# Patient Record
Sex: Female | Born: 1935
Health system: Southern US, Community
[De-identification: ages and names within clinical notes are randomized; demographics above are authoritative.]

## PROBLEM LIST (undated history)

## (undated) DIAGNOSIS — D649 Anemia, unspecified: Secondary | ICD-10-CM

## (undated) DIAGNOSIS — K649 Unspecified hemorrhoids: Secondary | ICD-10-CM

## (undated) DIAGNOSIS — K5790 Diverticulosis of intestine, part unspecified, without perforation or abscess without bleeding: Secondary | ICD-10-CM

## (undated) DIAGNOSIS — C169 Malignant neoplasm of stomach, unspecified: Secondary | ICD-10-CM

## (undated) DIAGNOSIS — K589 Irritable bowel syndrome without diarrhea: Secondary | ICD-10-CM

## (undated) DIAGNOSIS — K4090 Unilateral inguinal hernia, without obstruction or gangrene, not specified as recurrent: Secondary | ICD-10-CM

## (undated) DIAGNOSIS — K259 Gastric ulcer, unspecified as acute or chronic, without hemorrhage or perforation: Secondary | ICD-10-CM

## (undated) DIAGNOSIS — N390 Urinary tract infection, site not specified: Secondary | ICD-10-CM

## (undated) DIAGNOSIS — E559 Vitamin D deficiency, unspecified: Secondary | ICD-10-CM

## (undated) DIAGNOSIS — N362 Urethral caruncle: Secondary | ICD-10-CM

## (undated) DIAGNOSIS — I10 Essential (primary) hypertension: Secondary | ICD-10-CM

## (undated) DIAGNOSIS — M1990 Unspecified osteoarthritis, unspecified site: Secondary | ICD-10-CM

## (undated) DIAGNOSIS — K5900 Constipation, unspecified: Secondary | ICD-10-CM

## (undated) HISTORY — DX: Diverticulosis of intestine, part unspecified, without perforation or abscess without bleeding: K57.90

## (undated) HISTORY — PX: APPENDECTOMY: SHX54

## (undated) HISTORY — PX: OTHER SURGICAL HISTORY: SHX169

## (undated) HISTORY — PX: LUMBAR FUSION: SHX111

## (undated) HISTORY — DX: Irritable bowel syndrome, unspecified: K58.9

## (undated) HISTORY — PX: TOTAL HIP ARTHROPLASTY: SHX124

## (undated) HISTORY — PX: SHOULDER SURGERY: SHX246

## (undated) HISTORY — PX: FINGER SURGERY: SHX640

## (undated) HISTORY — DX: Unspecified osteoarthritis, unspecified site: M19.90

## (undated) HISTORY — DX: Unspecified hemorrhoids: K64.9

## (undated) HISTORY — DX: Malignant neoplasm of stomach, unspecified: C16.9

## (undated) HISTORY — DX: Vitamin D deficiency, unspecified: E55.9

## (undated) HISTORY — PX: LUMBAR LAMINECTOMY: SHX95

## (undated) HISTORY — PX: HEMORRHOID SURGERY: SHX153

## (undated) HISTORY — DX: Urinary tract infection, site not specified: N39.0

## (undated) HISTORY — DX: Gastric ulcer, unspecified as acute or chronic, without hemorrhage or perforation: K25.9

## (undated) HISTORY — PX: BREAST SURGERY: SHX581

## (undated) HISTORY — DX: Anemia, unspecified: D64.9

## (undated) HISTORY — DX: Urethral caruncle: N36.2

## (undated) HISTORY — PX: HIP SURGERY: SHX245

## (undated) HISTORY — DX: Essential (primary) hypertension: I10

## (undated) HISTORY — PX: CERVICAL FUSION: SHX112

---

## 1998-09-09 ENCOUNTER — Other Ambulatory Visit: Admission: RE | Admit: 1998-09-09 | Discharge: 1998-09-09 | Payer: Self-pay | Admitting: Gastroenterology

## 1998-10-28 ENCOUNTER — Other Ambulatory Visit: Admission: RE | Admit: 1998-10-28 | Discharge: 1998-10-28 | Payer: Self-pay | Admitting: Obstetrics and Gynecology

## 1999-11-03 ENCOUNTER — Other Ambulatory Visit: Admission: RE | Admit: 1999-11-03 | Discharge: 1999-11-03 | Payer: Self-pay | Admitting: Obstetrics and Gynecology

## 1999-11-15 ENCOUNTER — Ambulatory Visit (HOSPITAL_COMMUNITY): Admission: RE | Admit: 1999-11-15 | Discharge: 1999-11-15 | Payer: Self-pay | Admitting: Neurosurgery

## 2001-01-19 ENCOUNTER — Other Ambulatory Visit: Admission: RE | Admit: 2001-01-19 | Discharge: 2001-01-19 | Payer: Self-pay | Admitting: Internal Medicine

## 2001-04-04 ENCOUNTER — Encounter (INDEPENDENT_AMBULATORY_CARE_PROVIDER_SITE_OTHER): Payer: Self-pay | Admitting: Specialist

## 2001-04-04 ENCOUNTER — Ambulatory Visit (HOSPITAL_BASED_OUTPATIENT_CLINIC_OR_DEPARTMENT_OTHER): Admission: RE | Admit: 2001-04-04 | Discharge: 2001-04-04 | Payer: Self-pay | Admitting: General Surgery

## 2002-05-17 ENCOUNTER — Other Ambulatory Visit: Admission: RE | Admit: 2002-05-17 | Discharge: 2002-05-17 | Payer: Self-pay | Admitting: Gynecology

## 2003-06-04 ENCOUNTER — Other Ambulatory Visit: Admission: RE | Admit: 2003-06-04 | Discharge: 2003-06-04 | Payer: Self-pay | Admitting: Gynecology

## 2004-09-09 ENCOUNTER — Ambulatory Visit: Payer: Self-pay | Admitting: Internal Medicine

## 2005-01-19 ENCOUNTER — Ambulatory Visit: Payer: Self-pay | Admitting: Internal Medicine

## 2005-04-21 ENCOUNTER — Ambulatory Visit: Payer: Self-pay | Admitting: Internal Medicine

## 2005-07-23 ENCOUNTER — Ambulatory Visit: Payer: Self-pay | Admitting: Internal Medicine

## 2005-08-30 ENCOUNTER — Ambulatory Visit: Payer: Self-pay | Admitting: Internal Medicine

## 2005-09-08 ENCOUNTER — Ambulatory Visit: Payer: Self-pay | Admitting: Gastroenterology

## 2005-09-14 ENCOUNTER — Other Ambulatory Visit: Admission: RE | Admit: 2005-09-14 | Discharge: 2005-09-14 | Payer: Self-pay | Admitting: Gynecology

## 2005-09-17 ENCOUNTER — Ambulatory Visit: Payer: Self-pay | Admitting: Internal Medicine

## 2005-09-19 ENCOUNTER — Encounter (INDEPENDENT_AMBULATORY_CARE_PROVIDER_SITE_OTHER): Payer: Self-pay | Admitting: *Deleted

## 2005-09-21 ENCOUNTER — Encounter (INDEPENDENT_AMBULATORY_CARE_PROVIDER_SITE_OTHER): Payer: Self-pay | Admitting: *Deleted

## 2005-09-21 ENCOUNTER — Ambulatory Visit: Payer: Self-pay | Admitting: Gastroenterology

## 2005-10-08 ENCOUNTER — Ambulatory Visit: Payer: Self-pay | Admitting: Internal Medicine

## 2005-10-11 ENCOUNTER — Ambulatory Visit: Payer: Self-pay | Admitting: Internal Medicine

## 2005-12-10 ENCOUNTER — Ambulatory Visit: Payer: Self-pay | Admitting: Internal Medicine

## 2006-06-09 ENCOUNTER — Ambulatory Visit: Payer: Self-pay | Admitting: Internal Medicine

## 2006-09-01 ENCOUNTER — Ambulatory Visit: Payer: Self-pay | Admitting: Internal Medicine

## 2006-09-27 LAB — CONVERTED CEMR LAB: Pap Smear: NORMAL

## 2006-11-11 ENCOUNTER — Ambulatory Visit: Payer: Self-pay | Admitting: Gastroenterology

## 2006-12-09 ENCOUNTER — Ambulatory Visit: Payer: Self-pay | Admitting: Internal Medicine

## 2006-12-09 LAB — CONVERTED CEMR LAB: TSH: 2.11 microintl units/mL (ref 0.35–5.50)

## 2007-03-09 ENCOUNTER — Ambulatory Visit: Payer: Self-pay | Admitting: Internal Medicine

## 2007-03-09 LAB — CONVERTED CEMR LAB: Vit D, 1,25-Dihydroxy: 21 (ref 20–57)

## 2007-05-30 ENCOUNTER — Ambulatory Visit: Payer: Self-pay | Admitting: Internal Medicine

## 2007-05-30 DIAGNOSIS — I10 Essential (primary) hypertension: Secondary | ICD-10-CM | POA: Insufficient documentation

## 2007-05-30 DIAGNOSIS — K589 Irritable bowel syndrome without diarrhea: Secondary | ICD-10-CM | POA: Insufficient documentation

## 2007-05-30 DIAGNOSIS — K5730 Diverticulosis of large intestine without perforation or abscess without bleeding: Secondary | ICD-10-CM | POA: Insufficient documentation

## 2007-05-30 DIAGNOSIS — M1990 Unspecified osteoarthritis, unspecified site: Secondary | ICD-10-CM | POA: Insufficient documentation

## 2007-05-30 DIAGNOSIS — M810 Age-related osteoporosis without current pathological fracture: Secondary | ICD-10-CM | POA: Insufficient documentation

## 2007-05-30 LAB — CONVERTED CEMR LAB
BUN: 23 mg/dL (ref 6–23)
CO2: 29 meq/L (ref 19–32)
Calcium: 9.3 mg/dL (ref 8.4–10.5)
Chloride: 101 meq/L (ref 96–112)
Creatinine, Ser: 0.8 mg/dL (ref 0.4–1.2)
GFR calc Af Amer: 90 mL/min
GFR calc non Af Amer: 75 mL/min
Glucose, Bld: 93 mg/dL (ref 70–99)
Potassium: 4.2 meq/L (ref 3.5–5.1)
Sodium: 137 meq/L (ref 135–145)

## 2007-06-22 ENCOUNTER — Encounter: Payer: Self-pay | Admitting: Internal Medicine

## 2007-08-08 ENCOUNTER — Telehealth: Payer: Self-pay | Admitting: Internal Medicine

## 2007-08-28 ENCOUNTER — Ambulatory Visit: Payer: Self-pay | Admitting: Internal Medicine

## 2007-08-28 LAB — CONVERTED CEMR LAB: Vit D, 1,25-Dihydroxy: 49 (ref 30–89)

## 2007-09-04 ENCOUNTER — Ambulatory Visit: Payer: Self-pay | Admitting: Internal Medicine

## 2007-09-04 LAB — CONVERTED CEMR LAB
BUN: 18 mg/dL (ref 6–23)
CA 125: 16.9 units/mL (ref 0.0–30.2)
CO2: 29 meq/L (ref 19–32)
Calcium: 9.8 mg/dL (ref 8.4–10.5)
Chloride: 104 meq/L (ref 96–112)
Creatinine, Ser: 0.7 mg/dL (ref 0.4–1.2)
GFR calc Af Amer: 105 mL/min
GFR calc non Af Amer: 87 mL/min
Glucose, Bld: 93 mg/dL (ref 70–99)
Potassium: 3.8 meq/L (ref 3.5–5.1)
Sodium: 142 meq/L (ref 135–145)

## 2007-09-05 ENCOUNTER — Telehealth: Payer: Self-pay | Admitting: *Deleted

## 2007-09-29 ENCOUNTER — Encounter: Payer: Self-pay | Admitting: Internal Medicine

## 2007-09-29 ENCOUNTER — Other Ambulatory Visit: Admission: RE | Admit: 2007-09-29 | Discharge: 2007-09-29 | Payer: Self-pay | Admitting: Gynecology

## 2007-12-04 ENCOUNTER — Ambulatory Visit: Payer: Self-pay | Admitting: Internal Medicine

## 2007-12-04 DIAGNOSIS — R0789 Other chest pain: Secondary | ICD-10-CM | POA: Insufficient documentation

## 2007-12-07 ENCOUNTER — Ambulatory Visit: Payer: Self-pay | Admitting: Internal Medicine

## 2007-12-07 ENCOUNTER — Encounter: Payer: Self-pay | Admitting: Internal Medicine

## 2008-03-26 ENCOUNTER — Ambulatory Visit: Payer: Self-pay | Admitting: Internal Medicine

## 2008-03-26 LAB — CONVERTED CEMR LAB
ALT: 23 units/L (ref 0–35)
AST: 26 units/L (ref 0–37)
Albumin: 4 g/dL (ref 3.5–5.2)
Alkaline Phosphatase: 45 units/L (ref 39–117)
Bilirubin, Direct: 0.1 mg/dL (ref 0.0–0.3)
Cholesterol: 198 mg/dL (ref 0–200)
HDL: 47.9 mg/dL (ref >39.0)
LDL Cholesterol: 133 mg/dL — ABNORMAL HIGH (ref 0–99)
Total Bilirubin: 1.1 mg/dL (ref 0.3–1.2)
Total CHOL/HDL Ratio: 4.1
Total Protein: 6.8 g/dL (ref 6.0–8.3)
Triglycerides: 88 mg/dL (ref 0–149)
VLDL: 18 mg/dL (ref 0–40)

## 2008-04-02 ENCOUNTER — Ambulatory Visit: Payer: Self-pay | Admitting: Internal Medicine

## 2008-04-02 DIAGNOSIS — K648 Other hemorrhoids: Secondary | ICD-10-CM | POA: Insufficient documentation

## 2008-04-04 ENCOUNTER — Telehealth: Payer: Self-pay | Admitting: *Deleted

## 2008-05-15 ENCOUNTER — Telehealth: Payer: Self-pay | Admitting: Internal Medicine

## 2008-06-12 ENCOUNTER — Inpatient Hospital Stay (HOSPITAL_COMMUNITY): Admission: EM | Admit: 2008-06-12 | Discharge: 2008-06-16 | Payer: Self-pay | Admitting: Emergency Medicine

## 2008-07-30 ENCOUNTER — Ambulatory Visit: Payer: Self-pay | Admitting: Internal Medicine

## 2008-08-22 ENCOUNTER — Telehealth: Payer: Self-pay | Admitting: *Deleted

## 2008-10-02 ENCOUNTER — Ambulatory Visit: Payer: Self-pay | Admitting: Internal Medicine

## 2008-10-02 DIAGNOSIS — E785 Hyperlipidemia, unspecified: Secondary | ICD-10-CM | POA: Insufficient documentation

## 2008-10-02 DIAGNOSIS — L659 Nonscarring hair loss, unspecified: Secondary | ICD-10-CM | POA: Insufficient documentation

## 2008-10-02 LAB — CONVERTED CEMR LAB
Cholesterol: 209 mg/dL (ref 0–200)
Direct LDL: 131.6 mg/dL
HDL: 48.9 mg/dL (ref >39.0)
Prealbumin: 22.8 mg/dL (ref 18.0–45.0)
TSH: 1.4 microintl units/mL (ref 0.35–5.50)
Total CHOL/HDL Ratio: 4.3
Total Protein: 7.3 g/dL (ref 6.0–8.3)
Triglycerides: 94 mg/dL (ref 0–149)
VLDL: 19 mg/dL (ref 0–40)

## 2009-01-01 LAB — HM MAMMOGRAPHY

## 2009-01-15 ENCOUNTER — Telehealth: Payer: Self-pay | Admitting: Internal Medicine

## 2009-04-02 ENCOUNTER — Ambulatory Visit: Payer: Self-pay | Admitting: Internal Medicine

## 2009-10-01 ENCOUNTER — Ambulatory Visit: Payer: Self-pay | Admitting: Internal Medicine

## 2009-10-01 LAB — CONVERTED CEMR LAB
ALT: 15 units/L (ref 0–35)
AST: 25 units/L (ref 0–37)
Albumin: 4 g/dL (ref 3.5–5.2)
Alkaline Phosphatase: 44 units/L (ref 39–117)
BUN: 18 mg/dL (ref 6–23)
Bilirubin, Direct: 0 mg/dL (ref 0.0–0.3)
CO2: 30 meq/L (ref 19–32)
Calcium: 9.1 mg/dL (ref 8.4–10.5)
Chloride: 107 meq/L (ref 96–112)
Cholesterol: 187 mg/dL (ref 0–200)
Creatinine, Ser: 0.8 mg/dL (ref 0.4–1.2)
GFR calc non Af Amer: 74.2 mL/min (ref >60)
Glucose, Bld: 88 mg/dL (ref 70–99)
HDL: 53.4 mg/dL (ref >39.00)
LDL Cholesterol: 112 mg/dL — ABNORMAL HIGH (ref 0–99)
Potassium: 3.9 meq/L (ref 3.5–5.1)
Sodium: 143 meq/L (ref 135–145)
TSH: 1.06 microintl units/mL (ref 0.35–5.50)
Total Bilirubin: 0.9 mg/dL (ref 0.3–1.2)
Total CHOL/HDL Ratio: 4
Total Protein: 6.7 g/dL (ref 6.0–8.3)
Triglycerides: 109 mg/dL (ref 0.0–149.0)
VLDL: 21.8 mg/dL (ref 0.0–40.0)

## 2009-10-08 ENCOUNTER — Ambulatory Visit: Payer: Self-pay | Admitting: Internal Medicine

## 2009-12-02 ENCOUNTER — Ambulatory Visit: Payer: Self-pay | Admitting: Internal Medicine

## 2009-12-02 DIAGNOSIS — N362 Urethral caruncle: Secondary | ICD-10-CM | POA: Insufficient documentation

## 2009-12-02 LAB — CONVERTED CEMR LAB
Bilirubin Urine: NEGATIVE
Blood in Urine, dipstick: NEGATIVE
Glucose, Urine, Semiquant: NEGATIVE
Ketones, urine, test strip: NEGATIVE
Nitrite: NEGATIVE
Protein, U semiquant: NEGATIVE
Specific Gravity, Urine: 1.015
Urobilinogen, UA: 0.2
WBC Urine, dipstick: NEGATIVE
pH: 6.5

## 2009-12-05 ENCOUNTER — Telehealth: Payer: Self-pay | Admitting: Internal Medicine

## 2010-02-11 ENCOUNTER — Ambulatory Visit: Payer: Self-pay | Admitting: Internal Medicine

## 2010-02-11 ENCOUNTER — Encounter: Payer: Self-pay | Admitting: Internal Medicine

## 2010-05-07 ENCOUNTER — Encounter: Payer: Self-pay | Admitting: Internal Medicine

## 2010-05-11 ENCOUNTER — Encounter: Payer: Self-pay | Admitting: Gastroenterology

## 2010-06-12 ENCOUNTER — Ambulatory Visit: Payer: Self-pay | Admitting: Internal Medicine

## 2010-06-12 DIAGNOSIS — K621 Rectal polyp: Secondary | ICD-10-CM

## 2010-06-12 DIAGNOSIS — K620 Anal polyp: Secondary | ICD-10-CM | POA: Insufficient documentation

## 2010-06-12 DIAGNOSIS — D649 Anemia, unspecified: Secondary | ICD-10-CM | POA: Insufficient documentation

## 2010-06-12 LAB — CONVERTED CEMR LAB
Basophils Absolute: 0.1 10*3/uL (ref 0.0–0.1)
Basophils Relative: 0.7 % (ref 0.0–3.0)
CEA: 1.6 ng/mL (ref 0.0–5.0)
Eosinophils Absolute: 0.2 10*3/uL (ref 0.0–0.7)
Eosinophils Relative: 1.8 % (ref 0.0–5.0)
HCT: 33.8 % — ABNORMAL LOW (ref 36.0–46.0)
Hemoglobin: 11.5 g/dL — ABNORMAL LOW (ref 12.0–15.0)
Lymphocytes Relative: 33.8 % (ref 12.0–46.0)
Lymphs Abs: 2.9 10*3/uL (ref 0.7–4.0)
MCHC: 34.1 g/dL (ref 30.0–36.0)
MCV: 94.7 fL (ref 78.0–100.0)
Monocytes Absolute: 0.7 10*3/uL (ref 0.1–1.0)
Monocytes Relative: 7.9 % (ref 3.0–12.0)
Neutro Abs: 4.8 10*3/uL (ref 1.4–7.7)
Neutrophils Relative %: 55.8 % (ref 43.0–77.0)
Platelets: 180 10*3/uL (ref 150.0–400.0)
RBC: 3.57 M/uL — ABNORMAL LOW (ref 3.87–5.11)
RDW: 13 % (ref 11.5–14.6)
WBC: 8.6 10*3/uL (ref 4.5–10.5)

## 2010-06-16 ENCOUNTER — Ambulatory Visit: Payer: Self-pay | Admitting: Gastroenterology

## 2010-06-16 ENCOUNTER — Encounter: Payer: Self-pay | Admitting: Gastroenterology

## 2010-06-16 ENCOUNTER — Encounter (INDEPENDENT_AMBULATORY_CARE_PROVIDER_SITE_OTHER): Payer: Self-pay | Admitting: *Deleted

## 2010-06-16 DIAGNOSIS — K625 Hemorrhage of anus and rectum: Secondary | ICD-10-CM | POA: Insufficient documentation

## 2010-06-16 LAB — CONVERTED CEMR LAB
Ferritin: 70.6 ng/mL (ref 10.0–291.0)
Folate: >20 ng/mL
Iron: 103 ug/dL (ref 42–145)
Saturation Ratios: 32.7 % (ref 20.0–50.0)
Transferrin: 224.7 mg/dL (ref 212.0–360.0)
Vitamin B-12: 952 pg/mL — ABNORMAL HIGH (ref 211–911)

## 2010-06-17 ENCOUNTER — Telehealth: Payer: Self-pay | Admitting: Gastroenterology

## 2010-07-06 ENCOUNTER — Ambulatory Visit: Payer: Self-pay | Admitting: Gastroenterology

## 2010-07-06 ENCOUNTER — Ambulatory Visit (HOSPITAL_COMMUNITY): Admission: RE | Admit: 2010-07-06 | Discharge: 2010-07-06 | Payer: Self-pay | Admitting: Gastroenterology

## 2010-07-06 LAB — HM COLONOSCOPY

## 2010-07-08 ENCOUNTER — Telehealth: Payer: Self-pay | Admitting: Gastroenterology

## 2010-07-29 ENCOUNTER — Ambulatory Visit: Payer: Self-pay | Admitting: Gastroenterology

## 2010-09-11 ENCOUNTER — Ambulatory Visit: Payer: Self-pay | Admitting: Internal Medicine

## 2010-09-28 ENCOUNTER — Ambulatory Visit: Payer: Self-pay | Admitting: Internal Medicine

## 2010-09-28 DIAGNOSIS — J0110 Acute frontal sinusitis, unspecified: Secondary | ICD-10-CM | POA: Insufficient documentation

## 2010-10-06 ENCOUNTER — Telehealth: Payer: Self-pay | Admitting: Internal Medicine

## 2010-11-23 ENCOUNTER — Telehealth: Payer: Self-pay | Admitting: Internal Medicine

## 2010-11-24 NOTE — Progress Notes (Signed)
Summary: Medication  Phone Note Call from Patient Call back at Home Phone [phone]   Caller: Patient Call For: Dr. Jarold Motto Reason for Call: Talk to Nurse Summary of Call: Meds Dr.Patterson perscribed are too expensive...needs an alternative Initial call taken by: Karna Christmas,  June 17, 2010 8:05 AM  Follow-up for Phone Call        Pt states she cannot afford the canasa supp.  Samples given for pt to try.   Follow-up by: Ashok Cordia RN,  June 18, 2010 12:17 PM    New/Updated Medications: CANASA 1000 MG  SUPP (MESALAMINE) 1 per rectum q hs

## 2010-11-24 NOTE — Assessment & Plan Note (Signed)
Summary: flu shot/njr  Nurse Visit    Prior Medications: OSCAL 500/200 D-3 500-200 MG-UNIT  TABS (CALCIUM-VITAMIN D) once daily FISH OIL   OIL (FISH OIL) 4 po daily ZIAC 5-6.25 MG  TABS (BISOPROLOL-HYDROCHLOROTHIAZIDE) once daily MOBIC 7.5 MG  TABS (MELOXICAM) AS needed ANALPRAM-HC 1-2.5 %  CREA (HYDROCORTISONE ACE-PRAMOXINE) apply q HS for 7 days VITAMIN D 63875 UNIT  CAPS (ERGOCALCIFEROL) one by mouth weekly MEDROL 4 MG  TABS (METHYLPREDNISOLONE) as directed Current Allergies: No known allergies   Flu Vaccine Consent Questions     Do you have a history of severe allergic reactions to this vaccine? no    Any prior history of allergic reactions to egg and/or gelatin? no    Do you have a sensitivity to the preservative Thimersol? no    Do you have a past history of Guillan-Barre Syndrome? no    Do you currently have an acute febrile illness? no    Have you ever had a severe reaction to latex? no    Vaccine information given and explained to patient? yes    Are you currently pregnant? no    Lot Number:[id number]   Site Given  Left Deltoid IM   Orders Added: 1)  Flu Vaccine 91yrs + [64332] 2)  Administration Flu vaccine [G0008]    ]

## 2010-11-24 NOTE — Assessment & Plan Note (Signed)
Summary: Rectal Bleeding/dfs   History of Present Illness Visit Type: Initial Visit Primary GI MD: Sheryn Bison MD FACP FAGA Primary Roarke Marciano: Stacie Glaze MD Chief Complaint: Rectal bleeding recently 8/18; patient states that she had a tumor removed 40years ago located in large intestine;possible hemorrhoids? History of Present Illness:   77 year old Caucasian female patient of Dr. Darryll Capers. She's previously been a patient of Dr. Terrial Rhodes in gastroenterology. She has an extremely strong family history of colon cancer in her mother and brother, and she apparently had surgical removal of colon tumor of unknown etiology when she was age 18.  She's had several colonoscopies most recently performed in 2006. She has had continual rectal bleeding for several months, but this admitted a problem for several years, and she has not responded to standard hemorrhoidal creams. She continues with mild constipation, rectal discomfort, and rectal bleeding. She recent saw Dr. Lovell Sheehan and was placed on Analpram cream. She does take a daily fiber supplement.  Family history is remarkable for mother having colon cancer at age 46, sister colon cancer age 33, brother with colon cancer age 82, father with throat cancer age 61, another sister with breast cancer, another brother with lung cancer, and several siblings with severe coronary artery disease. She recently had blood work which showed a mild nonspecific anemia. She denies abdominal pain or upper gastrointestinal or hepatobiliary symptomatology. There's been no anorexia, weight loss, or systemic symptoms.   GI Review of Systems      Denies abdominal pain, acid reflux, belching, bloating, chest pain, dysphagia with liquids, dysphagia with solids, heartburn, loss of appetite, nausea, vomiting, vomiting blood, weight loss, and  weight gain.      Reports black tarry stools, diverticulosis, hemorrhoids, and  rectal bleeding.     Denies anal fissure,  change in bowel habit, constipation, diarrhea, fecal incontinence, heme positive stool, irritable bowel syndrome, jaundice, light color stool, liver problems, and  rectal pain. Preventive Screening-Counseling & Management      Drug Use:  no.      Current Medications (verified): 1)  Citracal Plus  Tabs (Multiple Minerals-Vitamins) .... Once Daily 2)  Fish Oil Concentrate 1000 Mg Caps (Omega-3 Fatty Acids) .... Two By Mouth Bid 3)  Bisoprolol-Hydrochlorothiazide 5-6.25 Mg Tabs (Bisoprolol-Hydrochlorothiazide) .... One By Mouth Daily 4)  Mobic 15 Mg Tabs (Meloxicam) .Marland Kitchen.. 1 Once Daily As Needed 5)  Analpram-Hc 1-2.5 %  Crea (Hydrocortisone Ace-Pramoxine) .... Apply Q Hs For 7 Days 6)  Vitamin D 04540 Unit  Caps (Ergocalciferol) .... One By Mouth Weekly 7)  B Complex  Tabs (B Complex Vitamins) .... One By Mouth Daily 8)  Osteo Bi-Flex Regular Strength 250-200 Mg Tabs (Glucosamine-Chondroitin) .Marland Kitchen.. 1 Once Daily 9)  Multivitamins  Tabs (Multiple Vitamin) .... Once Daily  Allergies (verified): 1)  ! Codeine  Past History:  Past medical, surgical, family and social histories (including risk factors) reviewed for relevance to current acute and chronic problems.  Past Medical History: Diverticulosis, colon Osteoporosis Hypertension IBS 564.1 Urinary Tract Infection  Past Surgical History: Lumbar laminectomy Lumbar fusion partial resection of colon for "tumor" colon polyp colon 2006 Total hip replacement right Appendectomy  Family History: Reviewed history from 09/04/2007 and no changes required. father Family History Lung cancer mother Family History of Colon CA 1st degree relative <60 Family History Ovarian cancer sister   Family History of Liver Cancer:Brother Family History of Breast Cancer:Sister  Social History: Reviewed history from 05/30/2007 and no changes required. Retired Single Never Smoked Alcohol  Use - no Daily Caffeine Use 3 Illicit Drug Use - no  Review  of Systems       The patient complains of arthritis/joint pain and muscle pains/cramps.  The patient denies allergy/sinus, anemia, anxiety-new, back pain, blood in urine, breast changes/lumps, change in vision, confusion, cough, coughing up blood, depression-new, fainting, fatigue, fever, headaches-new, hearing problems, heart murmur, heart rhythm changes, itching, menstrual pain, night sweats, nosebleeds, pregnancy symptoms, shortness of breath, skin rash, sleeping problems, sore throat, swelling of feet/legs, swollen lymph glands, thirst - excessive , urination - excessive , urination changes/pain, urine leakage, vision changes, and voice change.    Vital Signs:  Patient profile:   77 year old female Height:      64 inches Weight:      117.13 pounds BMI:     20.18 Pulse rate:   80 / minute Pulse rhythm:   regular BP sitting:   120 / 68  (left arm) Cuff size:   regular  Vitals Entered By: June McMurray CMA Duncan Dull) (June 16, 2010 10:44 AM)  Physical Exam  General:  Well developed, well nourished, no acute distress.healthy appearing.   Head:  Normocephalic and atraumatic. Eyes:  PERRLA, no icterus.exam deferred to patient's ophthalmologist.   Neck:  Supple; no masses or thyromegaly. Lungs:  Clear throughout to auscultation. Heart:  Regular rate and rhythm; no murmurs, rubs,  or bruits. Abdomen:  There is no abdominal distention, organomegaly, masses or tenderness. Bowel sounds are normal. Rectal:  there are mixed hemorrhoids visualized with a small anterior skin tag. I cannot appreciate a definite fissure or fistula. Rectal exam shows no masses or tenderness or impaction. Stool is guaiac-negative. Extremities:  No clubbing, cyanosis, edema or deformities noted. Neurologic:  Alert and  oriented x4;  grossly normal neurologically. Psych:  Alert and cooperative. Normal mood and affect.anxious.     Impression & Recommendations:  Problem # 1:  RECTAL BLEEDING (ICD-569.3) Assessment  Deteriorated Her bleeding is probably hemorrhoidal, and she needs more definitive treatment of her hemorrhoids. I have scheduled her for followup colonoscopy with probable endoscopic BANDING per Dr. Arlyce Dice at [facility]. Of course with her family history of colon cancer we need to exclude colonic polyposis. Dr. Corinda Gubler apparently had to use a pediatric colonoscope previously because of colonic adhesions. Other considerations would be surgical stapling of her hemorrhoids. I have continued her Analpram cream and will also treat with Canasa 1 g suppositories at bedtime and continue fiber supplements for now. Iron levels have been requested.  Problem # 2:  COLONIC POLYPS, ADENOMATOUS, BENIGN (ICD-569.0) Assessment: Comment Only Consider referral for genetic evaluation for possible Lynch II syndrome per her family history of colon, lung, and gynecologic carcinoma. This hopefully can be accomplished after her colonoscopy has been completed by Dr. Arlyce Dice.  Other Orders: TLB-B12, Serum-Total ONLY ([address]-U04) TLB-Ferritin (82728-FER) TLB-Folic Acid (Folate) (82746-FOL) TLB-IBC Pnl (Iron/FE;Transferrin) (83550-IBC)  Patient Instructions: 1)  Please go to the basement for lab work. 2)  You are scheduled for a colonoscopy at [facility]. 3)  Begin Canasa supppository at bedtime 4)  The medication list was reviewed and reconciled.  All changed / newly prescribed medications were explained.  A complete medication list was provided to the patient / caregiver. 5)  Copy sent to : Dr. Darryll Capers. 6)  Please continue current medications.  7)  Constipation and Hemorrhoids brochure given.   Appended Document: Rectal Bleeding/dfs    Clinical Lists Changes  Medications: Added new medication of MOVIPREP 100 GM  SOLR (PEG-KCL-NACL-NASULF-NA ASC-C) As per prep instructions. - Signed Added new medication of CANASA 1000 MG  SUPP (MESALAMINE) 1 per rectum q hs - Signed Rx of MOVIPREP 100 GM  SOLR  (PEG-KCL-NACL-NASULF-NA ASC-C) As per prep instructions.;  #1 x 0;  Signed;  Entered by: Ashok Cordia RN;  Authorized by: Mardella Layman MD [facility];  Method used: Electronically to [facility] [address], [place], [place], [place]  [address], Ph: [phone], Fax: [phone] Rx of CANASA 1000 MG  SUPP (MESALAMINE) 1 per rectum q hs;  #30 x 2;  Signed;  Entered by: Ashok Cordia RN;  Authorized by: Mardella Layman MD [facility];  Method used: Electronically to [facility] [address] [place], [place], [place]  [address], Ph: [phone], Fax: [phone] Orders: Added new Test order of ZCOL Banding (ZCOL Band) - Signed    Prescriptions: CANASA 1000 MG  SUPP (MESALAMINE) 1 per rectum q hs  #30 x 2   Entered by:   Ashok Cordia RN   Authorized by:   Mardella Layman MD [facility]   Signed by:   Ashok Cordia RN on 06/16/2010   Method used:   Electronically to        [facility].* (retail)       [address]       [place], [place]  [address]       Ph: [phone]       Fax: [phone]   RxID:   [id number] MOVIPREP 100 GM  SOLR (PEG-KCL-NACL-NASULF-NA ASC-C) As per prep instructions.  #1 x 0   Entered by:   Ashok Cordia RN   Authorized by:   Mardella Layman MD [facility]   Signed by:   Ashok Cordia RN on 06/16/2010   Method used:   Electronically to        [facility].* (retail)       [address]       [place], [place]  [address]       Ph: [phone]       Fax: [phone]   RxID:   [id number]

## 2010-11-24 NOTE — Assessment & Plan Note (Signed)
Summary: POSSIBLE UTI? // RS   Vital Signs:  Patient profile:   77 year old female Height:      64 inches Weight:      122 pounds BMI:     21.02 Temp:     98.6 degrees F oral Pulse rate:   72 / minute Resp:     14 per minute BP sitting:   134 / 70  (left arm)  Vitals Entered By: Willy Eddy, LPN (December 02, 2009 3:26 PM) CC: c/o u rinary pressure   CC:  c/o u rinary pressure.  History of Present Illness: Urinary pressure at night urine is negative for infection on Dip has increased presure in bladder at night that results in her waking up to urinate 2-3 times has noted some mild increased during the day she has buring with urination moderate tenderness with wiping blood pressure is in good control  Preventive Screening-Counseling & Management  Alcohol-Tobacco     Smoking Status: never     Passive Smoke Exposure: no  Problems Prior to Update: 1)  Hyperlipidemia, Mild  (ICD-272.4) 2)  Hair Loss  (ICD-704.00) 3)  Internal Hemorrhoids With Other Complication  (ICD-455.2) 4)  Chest Pain, Atypical  (ICD-786.59) 5)  Neoplasm, Malignant, Ovary, Family Hx  (ICD-V16.41) 6)  Family History of Colon Ca 1st Degree Relative <60  (ICD-V16.0) 7)  Degenerative Joint Disease, Mild  (ICD-715.90) 8)  Irritable Bowel Syndrome  (ICD-564.1) 9)  Hypertension  (ICD-401.9) 10)  Osteoporosis  (ICD-733.00) 11)  Diverticulosis, Colon  (ICD-562.10)  Medications Prior to Update: 1)  Oscal 500/200 D-3 500-200 Mg-Unit  Tabs (Calcium-Vitamin D) .... Once Daily 2)  Fish Oil Concentrate 1000 Mg Caps (Omega-3 Fatty Acids) .... Two By Mouth Bid 3)  Ziac 5-6.25 Mg  Tabs (Bisoprolol-Hydrochlorothiazide) .... Once Daily 4)  Mobic 7.5 Mg  Tabs (Meloxicam) .... As Needed 5)  Analpram-Hc 1-2.5 %  Crea (Hydrocortisone Ace-Pramoxine) .... Apply Q Hs For 7 Days 6)  Vitamin D [address] Unit  Caps (Ergocalciferol) .... One By Mouth Weekly 7)  B Complex  Tabs (B Complex Vitamins) .... One By Mouth Daily 8)   Osteo Bi-Flex Regular Strength 250-200 Mg Tabs (Glucosamine-Chondroitin) .Marland Kitchen.. 1 Once Daily  Current Medications (verified): 1)  Oscal 500/200 D-3 500-200 Mg-Unit  Tabs (Calcium-Vitamin D) .... Once Daily 2)  Fish Oil Concentrate 1000 Mg Caps (Omega-3 Fatty Acids) .... Two By Mouth Bid 3)  Bisoprolol-Hydrochlorothiazide 5-6.25 Mg Tabs (Bisoprolol-Hydrochlorothiazide) .... One By Mouth Daily 4)  Mobic 7.5 Mg  Tabs (Meloxicam) .... As Needed 5)  Analpram-Hc 1-2.5 %  Crea (Hydrocortisone Ace-Pramoxine) .... Apply Q Hs For 7 Days 6)  Vitamin D 60454 Unit  Caps (Ergocalciferol) .... One By Mouth Weekly 7)  B Complex  Tabs (B Complex Vitamins) .... One By Mouth Daily 8)  Osteo Bi-Flex Regular Strength 250-200 Mg Tabs (Glucosamine-Chondroitin) .Marland Kitchen.. 1 Once Daily 9)  Cephalexin 500 Mg Caps (Cephalexin) .... One By Mouth Three Times A Day For 10 Days  Allergies (verified): No Known Drug Allergies  Past History:  Family History: Last updated: 09/04/2007 father Family History Lung cancer mother Family History of Colon CA 1st degree relative <60 Family History Ovarian cancer sister    Social History: Last updated: 05/30/2007 Retired Single Never Smoked  Risk Factors: Smoking Status: never (12/02/2009) Passive Smoke Exposure: no (12/02/2009)  Past medical, surgical, family and social histories (including risk factors) reviewed, and no changes noted (except as noted below).  Past Medical History: Reviewed history  from 05/30/2007 and no changes required. Diverticulosis, colon Osteoporosis Hypertension IBS 564.1  Past Surgical History: Reviewed history from 10/02/2008 and no changes required. Lumbar laminectomy Lumbar fusion colon polyp colon 2006 Total hip replacement right  Family History: Reviewed history from 09/04/2007 and no changes required. father Family History Lung cancer mother Family History of Colon CA 1st degree relative <60 Family History Ovarian cancer  sister    Social History: Reviewed history from 05/30/2007 and no changes required. Retired Single Never Smoked  Review of Systems       The patient complains of incontinence.  The patient denies anorexia, fever, weight loss, weight gain, vision loss, decreased hearing, hoarseness, chest pain, syncope, dyspnea on exertion, peripheral edema, prolonged cough, headaches, hemoptysis, abdominal pain, melena, hematochezia, severe indigestion/heartburn, hematuria, genital sores, muscle weakness, suspicious skin lesions, transient blindness, difficulty walking, depression, unusual weight change, abnormal bleeding, enlarged lymph nodes, angioedema, and breast masses.    Physical Exam  General:  Well-developed,well-nourished,in no acute distress; alert,appropriate and cooperative throughout examination Head:  Normocephalic and atraumatic without obvious abnormalities. No apparent alopecia or balding. Eyes:  pupils equal and pupils round.   Nose:  no external deformity and no nasal discharge.   Neck:  No deformities, masses, or tenderness noted. Lungs:  Normal respiratory effort, chest expands symmetrically. Lungs are clear to auscultation, no crackles or wheezes. Heart:  Normal rate and regular rhythm. S1 and S2 normal without gallop, murmur, click, rub or other extra sounds. Abdomen:  Bowel sounds positive,abdomen soft and non-tender without masses, organomegaly or hernias noted. Msk:  no joint tenderness, no joint swelling, no joint warmth, no joint deformities, and decreased ROM.   Extremities:  No clubbing, cyanosis, edema, or deformity noted with normal full range of motion of all joints.   Neurologic:  alert & oriented X3 and gait normal.     Impression & Recommendations:  Problem # 1:  URETHRAL CARUNCLE (ICD-599.3) keflex for 10 days and consider referral to urology the sudden onset lead to the infectious coause but one must aslo consider dropped bladder and overactive bladder  differentials  Problem # 2:  HYPERTENSION (ICD-401.9)  Her updated medication list for this problem includes:    Bisoprolol-hydrochlorothiazide 5-6.25 Mg Tabs (Bisoprolol-hydrochlorothiazide) ..... One by mouth daily  BP today: 134/70 Prior BP: 136/72 (10/08/2009)  Prior 10 Yr Risk Heart Disease: Not enough information (09/04/2007)  Labs Reviewed: K+: 3.9 (10/01/2009) Creat: : 0.8 (10/01/2009)   Chol: 187 (10/01/2009)   HDL: 53.40 (10/01/2009)   LDL: 112 (10/01/2009)   TG: 109.0 (10/01/2009)  Orders: Prescription Created Electronically [phone])  Complete Medication List: 1)  Oscal 500/200 D-3 500-200 Mg-unit Tabs (Calcium-vitamin d) .... Once daily 2)  Fish Oil Concentrate 1000 Mg Caps (Omega-3 fatty acids) .... Two by mouth bid 3)  Bisoprolol-hydrochlorothiazide 5-6.25 Mg Tabs (Bisoprolol-hydrochlorothiazide) .... One by mouth daily 4)  Mobic 7.5 Mg Tabs (Meloxicam) .... As needed 5)  Analpram-hc 1-2.5 % Crea (Hydrocortisone ace-pramoxine) .... Apply q hs for 7 days 6)  Vitamin D 03474 Unit Caps (Ergocalciferol) .... One by mouth weekly 7)  B Complex Tabs (B complex vitamins) .... One by mouth daily 8)  Osteo Bi-flex Regular Strength 250-200 Mg Tabs (Glucosamine-chondroitin) .Marland Kitchen.. 1 once daily 9)  Cephalexin 500 Mg Caps (Cephalexin) .... One by mouth three times a day for 10 days  Other Orders: UA Dipstick w/o Micro (automated)  (81003)  Patient Instructions: 1)  Take your antibiotic as prescribed until ALL of it is gone, but stop if  you develop a rash or swelling and contact our office as soon as possible. Prescriptions: BISOPROLOL-HYDROCHLOROTHIAZIDE 5-6.25 MG TABS (BISOPROLOL-HYDROCHLOROTHIAZIDE) one by mouth daily  #90 x 3   Entered and Authorized by:   Stacie Glaze MD   Signed by:   Stacie Glaze MD on 12/02/2009   Method used:   Electronically to        [facility].* (retail)       [address]       [place], [place]  [address]        Ph: [phone]       Fax: [phone]   RxID:   [id number] CEPHALEXIN 500 MG CAPS (CEPHALEXIN) one by mouth three times a day for 10 days  #30 x 0   Entered and Authorized by:   Stacie Glaze MD   Signed by:   Stacie Glaze MD on 12/02/2009   Method used:   Electronically to        [facility].* (retail)       [address]       [place], [place]  [address]       Ph: [phone]       Fax: [phone]   RxID:   [id number]   Laboratory Results   Urine Tests    Routine Urinalysis   Color: yellow Appearance: Clear Glucose: negative   (Normal Range: Negative) Bilirubin: negative   (Normal Range: Negative) Ketone: negative   (Normal Range: Negative) Spec. Gravity: 1.015   (Normal Range: 1.003-1.035) Blood: negative   (Normal Range: Negative) pH: 6.5   (Normal Range: 5.0-8.0) Protein: negative   (Normal Range: Negative) Urobilinogen: 0.2   (Normal Range: 0-1) Nitrite: negative   (Normal Range: Negative) Leukocyte Esterace: negative   (Normal Range: Negative)    Comments: Rita Ohara  December 02, 2009 3:39 PM

## 2010-11-24 NOTE — Progress Notes (Signed)
Summary: NEEDS RX FOR DIVERTICULITIS  Phone Note Call from Patient   Caller: Patient Call For: JENKINS Summary of Call: IS IN [place] TAKING CARE OF SISTER WHO HAS CANCER HAS DIVERTICULITIS AND NEEDS RX CALLED IN TO [facility] [phone] SHE CAN BE REACHED AT [phone]  Initial call taken by: Roselle Locus,  August 08, 2007 2:16 PM  Follow-up for Phone Call        she needs to see a local MD. Follow-up by: Birdie Sons MD,  August 08, 2007 2:51 PM  Additional Follow-up for Phone Call Additional follow up Details #1::        Pt informed, she is in [place] with her sister and will try to see Local MD. Additional Follow-up by: Sid Falcon LPN,  August 08, 2007 4:01 PM

## 2010-11-24 NOTE — Assessment & Plan Note (Signed)
Summary: 6  month rov/njr   Vital Signs:  Patient profile:   77 year old female Height:      64 inches Weight:      122 pounds BMI:     21.02 Temp:     98.2 degrees F oral Pulse rate:   76 / minute Resp:     14 per minute BP sitting:   132 / 70  (left arm)  Vitals Entered By: Willy Eddy, LPN (April 02, 1609 10:19 AM)  CC:  roa- had labs at last ov.  Hypertension History:      She denies headache, chest pain, palpitations, dyspnea with exertion, orthopnea, PND, peripheral edema, visual symptoms, neurologic problems, syncope, and side effects from treatment.        Positive major cardiovascular risk factors include female age 4 years old or older, hyperlipidemia, and hypertension.  Negative major cardiovascular risk factors include negative family history for ischemic heart disease and non-tobacco-user status.     Allergies: No Known Drug Allergies  Past History:  Family History: Last updated: 09/04/2007 father Family History Lung cancer mother Family History of Colon CA 1st degree relative <60 Family History Ovarian cancer sister    Social History: Last updated: 05/30/2007 Retired Single Never Smoked  Risk Factors: Smoking Status: never (05/30/2007) Passive Smoke Exposure: no (12/04/2007)  Past medical, surgical, family and social histories (including risk factors) reviewed, and no changes noted (except as noted below).  Past Medical History: Reviewed history from 05/30/2007 and no changes required. Diverticulosis, colon Osteoporosis Hypertension IBS 564.1  Past Surgical History: Reviewed history from 10/02/2008 and no changes required. Lumbar laminectomy Lumbar fusion colon polyp colon 2006 Total hip replacement right  Family History: Reviewed history from 09/04/2007 and no changes required. father Family History Lung cancer mother Family History of Colon CA 1st degree relative <60 Family History Ovarian cancer sister    Social  History: Reviewed history from 05/30/2007 and no changes required. Retired Single Never Smoked  Review of Systems  The patient denies anorexia, weight loss, weight gain, vision loss, decreased hearing, hoarseness, chest pain, syncope, dyspnea on exertion, peripheral edema, prolonged cough, headaches, hemoptysis, abdominal pain, melena, hematochezia, severe indigestion/heartburn, hematuria, incontinence, genital sores, muscle weakness, suspicious skin lesions, transient blindness, difficulty walking, depression, unusual weight change, abnormal bleeding, enlarged lymph nodes, angioedema, and breast masses.    Physical Exam  General:  Well-developed,well-nourished,in no acute distress; alert,appropriate and cooperative throughout examination Head:  Normocephalic and atraumatic without obvious abnormalities. No apparent alopecia or balding. Ears:  R ear normal and L ear normal.   Nose:  External nasal examination shows no deformity or inflammation. Nasal mucosa are pink and moist without lesions or exudates. Mouth:  pharynx pink and moist.   Neck:  No deformities, masses, or tenderness noted. Lungs:  Normal respiratory effort, chest expands symmetrically. Lungs are clear to auscultation, no crackles or wheezes. Heart:  Normal rate and regular rhythm. S1 and S2 normal without gallop, murmur, click, rub or other extra sounds. Abdomen:  Bowel sounds positive,abdomen soft and non-tender without masses, organomegaly or hernias noted. Msk:  no joint tenderness, no joint swelling, no joint warmth, no joint deformities, and decreased ROM.   Pulses:  R and L carotid,radial,femoral,dorsalis pedis and posterior tibial pulses are full and equal bilaterally Extremities:  No clubbing, cyanosis, edema, or deformity noted with normal full range of motion of all joints.   Neurologic:  alert & oriented X3, gait normal, and finger-to-nose normal.  Impression & Recommendations:  Problem # 1:  HYPERTENSION  (ICD-401.9)  Her updated medication list for this problem includes:    Ziac 5-6.25 Mg Tabs (Bisoprolol-hydrochlorothiazide) ..... Once daily  BP today: 132/70 Prior BP: 130/78 (10/02/2008)  Prior 10 Yr Risk Heart Disease: Not enough information (09/04/2007)  Labs Reviewed: K+: 3.8 (09/04/2007) Creat: : 0.7 (09/04/2007)   Chol: 209 (10/02/2008)   HDL: 48.9 (10/02/2008)   LDL: DEL (10/02/2008)   TG: 94 (10/02/2008)  Problem # 2:  HYPERLIPIDEMIA, MILD (ICD-272.4)  Labs Reviewed: SGOT: 26 (03/26/2008)   SGPT: 23 (03/26/2008)  Prior 10 Yr Risk Heart Disease: Not enough information (09/04/2007)   HDL:48.9 (10/02/2008), 47.9 (03/26/2008)  LDL:DEL (10/02/2008), 133 (03/26/2008)  Chol:209 (10/02/2008), 198 (03/26/2008)  Trig:94 (10/02/2008), 88 (03/26/2008)  Problem # 3:  OSTEOPOROSIS (ICD-733.00)  Discussed medication use, applications of heat or ice, and exercises.   Problem # 4:  IRRITABLE BOWEL SYNDROME (ICD-564.1) stable  Complete Medication List: 1)  Oscal 500/200 D-3 500-200 Mg-unit Tabs (Calcium-vitamin d) .... Once daily 2)  Fish Oil Concentrate 1000 Mg Caps (Omega-3 fatty acids) .... Two by mouth bid 3)  Ziac 5-6.25 Mg Tabs (Bisoprolol-hydrochlorothiazide) .... Once daily 4)  Mobic 7.5 Mg Tabs (Meloxicam) .... As needed 5)  Analpram-hc 1-2.5 % Crea (Hydrocortisone ace-pramoxine) .... Apply q hs for 7 days 6)  Vitamin D 16109 Unit Caps (Ergocalciferol) .... One by mouth weekly 7)  B Complex Tabs (B complex vitamins) .... One by mouth daily  Hypertension Assessment/Plan:      The patient's hypertensive risk group is category B: At least one risk factor (excluding diabetes) with no target organ damage.  Today's blood pressure is 132/70.  Her blood pressure goal is < 140/90.  Patient Instructions: 1)  Please schedule a follow-up appointment in 6 months. 2)  BMP prior to visit, ICD-9:401.90 3)  Hepatic Panel prior to visit, ICD-9:995.20 4)  Lipid Panel prior to visit,  ICD-9:272.4 5)  TSH prior to visit, ICD-9:272.4 Prescriptions: VITAMIN D 60454 UNIT  CAPS (ERGOCALCIFEROL) one by mouth weekly  #5 x 6   Entered and Authorized by:   Stacie Glaze MD   Signed by:   Stacie Glaze MD on 04/02/2009   Method used:   Electronically to        [facility] (retail)       [address]       [place], [place]  [address]       Ph: [phone]       Fax: [phone]   RxID:   [id number]

## 2010-11-24 NOTE — Assessment & Plan Note (Signed)
Summary: 4 month rov/njr   Vital Signs:  Patient Profile:   77 Years Old Female Height:     64 inches Weight:      117 pounds Temp:     98.2 degrees F oral Pulse rate:   76 / minute Resp:     14 per minute BP sitting:   130 / 70  (left arm)  Vitals Entered By: Willy Eddy, LPN (April 02, 1609 10:23 AM)                 Chief Complaint:  roa- c/o hemorrhoidal bleeding.  History of Present Illness:  Follow-Up Visit      This is a 77 year old woman who presents for Follow-up visit.  Hx of hemmorrhids without bleeding now threee episodes of bleeding with BM nd without. Bleeding had stopped for last 3 weeks. Episodes of blood in water and with wiping, gradually improved. Mild straining with stool Colon 2007 no polyps.  The patient denies chest pain, palpitations, dizziness, syncope, low blood sugar symptoms, high blood sugar symptoms, edema, SOB, DOE, PND, and orthopnea.  Since the last visit the patient notes being seen by a specialist.  The patient reports taking meds as prescribed and monitoring BP.  When questioned about possible medication side effects, the patient notes none.      Current Allergies: No known allergies   Past Medical History:    Reviewed history from 05/30/2007 and no changes required:       Diverticulosis, colon       Osteoporosis       Hypertension       IBS 564.1   Family History:    Reviewed history from 09/04/2007 and no changes required:       father       Family History Lung cancer       mother       Family History of Colon CA 1st degree relative <60       Family History Ovarian cancer sister          Social History:    Reviewed history from 05/30/2007 and no changes required:       Retired       Single       Never Smoked    Review of Systems  The patient denies anorexia, fever, weight loss, weight gain, vision loss, decreased hearing, hoarseness, chest pain, syncope, dyspnea on exertion, peripheral edema, prolonged cough,  headaches, hemoptysis, abdominal pain, melena, hematochezia, severe indigestion/heartburn, hematuria, incontinence, genital sores, muscle weakness, suspicious skin lesions, transient blindness, difficulty walking, depression, unusual weight change, abnormal bleeding, enlarged lymph nodes, angioedema, and breast masses.     Physical Exam  General:     Well-developed,well-nourished,in no acute distress; alert,appropriate and cooperative throughout examination Head:     Normocephalic and atraumatic without obvious abnormalities. No apparent alopecia or balding. Ears:     R ear normal and L ear normal.   Nose:     External nasal examination shows no deformity or inflammation. Nasal mucosa are pink and moist without lesions or exudates. Mouth:     pharynx pink and moist.   Neck:     No deformities, masses, or tenderness noted. Lungs:     Normal respiratory effort, chest expands symmetrically. Lungs are clear to auscultation, no crackles or wheezes. Heart:     Normal rate and regular rhythm. S1 and S2 normal without gallop, murmur, click, rub or other extra sounds. Abdomen:  Bowel sounds positive,abdomen soft and non-tender without masses, organomegaly or hernias noted. Msk:     enlarged MCP joints, enlarged PIP joints, enlarged DIP joints, and ulnar deviation of fingers.      Impression & Recommendations:  Problem # 1:  IRRITABLE BOWEL SYNDROME (ICD-564.1) hx of polup last colon was polyp free in 207 but hemorrhoids noted  Problem # 2:  INTERNAL HEMORRHOIDS WITH OTHER COMPLICATION (ICD-455.2) discussion of prevention and treatment options  Problem # 3:  HYPERTENSION (ICD-401.9)  Her updated medication list for this problem includes:    Ziac 5-6.25 Mg Tabs (Bisoprolol-hydrochlorothiazide) ..... Once daily  BP today: 130/70 Prior BP: 130/70 (12/04/2007)  Prior 10 Yr Risk Heart Disease: Not enough information (09/04/2007)  Labs Reviewed: Creat: 0.7 (09/04/2007) Chol: 198  (03/26/2008)   HDL: 47.9 (03/26/2008)   LDL: 133 (03/26/2008)   TG: 88 (03/26/2008)   Problem # 4:  OSTEOPOROSIS (ICD-733.00)  The following medications were removed from the medication list:    Vitamin D 1000 Unit Caps (Cholecalciferol) ..... One by mouth daily  Her updated medication list for this problem includes:    Oscal 500/200 D-3 500-200 Mg-unit Tabs (Calcium-vitamin d) ..... Once daily    Vitamin D 62130 Unit Caps (Ergocalciferol) ..... One by mouth weekly   Complete Medication List: 1)  Oscal 500/200 D-3 500-200 Mg-unit Tabs (Calcium-vitamin d) .... Once daily 2)  Fish Oil Oil (Fish oil) .... 4 po daily 3)  Ziac 5-6.25 Mg Tabs (Bisoprolol-hydrochlorothiazide) .... Once daily 4)  Mobic 7.5 Mg Tabs (Meloxicam) .... As needed 5)  Analpram-hc 1-2.5 % Crea (Hydrocortisone ace-pramoxine) .... Apply q hs for 7 days 6)  Vitamin D 86578 Unit Caps (Ergocalciferol) .... One by mouth weekly  Other Orders: Tetanus Toxoid w/Dx (46962) Admin 1st Vaccine (95284)   Patient Instructions: 1)  Please schedule a follow-up appointment in 6 months.   Prescriptions: VITAMIN D 13244 UNIT  CAPS (ERGOCALCIFEROL) one by mouth weekly  #5 x 6   Entered and Authorized by:   Stacie Glaze MD   Signed by:   Stacie Glaze MD on 04/02/2008   Method used:   Print then Give to Patient   RxID:   [id number] ANALPRAM-HC 1-2.5 %  CREA (HYDROCORTISONE ACE-PRAMOXINE) apply q HS for 7 days  #1 tube x 3   Entered and Authorized by:   Stacie Glaze MD   Signed by:   Stacie Glaze MD on 04/02/2008   Method used:   Print then Give to Patient   RxID:   [id number]  ]  Tetanus/Td Vaccine    Vaccine Type: Td    Site: left deltoid    Mfr: Sanofi Pasteur    Dose: 0.5 ml    Route: IM    Given by: Willy Eddy, LPN    Exp. Date: 11/04/2009    Lot #: [id number]

## 2010-11-24 NOTE — Progress Notes (Signed)
Summary: poison ivy   LIne busy x 2 when attempting to call back  Phone Note Call from Patient Call back at Home Phone [phone]   Caller: pt live Call For: Lovell Sheehan Summary of Call: Has poison ivy which is spreading.  Has been using calamine lotion not helping.  Would like to be worked in with someone today.  Qal Mart Randleman  Initial call taken by: Roselle Locus,  May 15, 2008 12:23 PM  Follow-up for Phone Call        medrol 4 mg 4 by mouth for 4 days, 3 by mouth for 3 days then 2 by mouth for 2 days the 1 by mouth x one day clear benadryl lotion as often as needed Follow-up by: Stacie Glaze MD,  May 15, 2008 1:07 PM    New/Updated Medications: MEDROL 4 MG  TABS (METHYLPREDNISOLONE) as directed   Prescriptions: MEDROL 4 MG  TABS (METHYLPREDNISOLONE) as directed  #30 x 0   Entered by:   Lynann Beaver CMA   Authorized by:   Stacie Glaze MD   Signed by:   Lynann Beaver CMA on 05/15/2008   Method used:   Telephoned to ...         RxID:   [id number]  Attempted to contact pt, but line busy x 3.  Pt contacted at 3: 20 pm [facility]  May 15, 2008 3:14 PM

## 2010-11-24 NOTE — Miscellaneous (Signed)
Summary: BONE DENSITY  Clinical Lists Changes  Orders: Added new Test order of T-Bone Densitometry (77080) - Signed Added new Test order of T-Lumbar Vertebral Assessment (77082) - Signed 

## 2010-11-24 NOTE — Assessment & Plan Note (Signed)
Summary: 3 MONTH ROA/JLS   Vital Signs:  Patient Profile:   77 Years Old Female Height:     64 inches Weight:      117 pounds Temp:     97.2 degrees F Pulse rate:   72 / minute Resp:     12 per minute BP sitting:   140 / 74  (left arm)  Vitals Entered By: Willy Eddy, LPN (May 30, 2007 10:07 AM)               Chief Complaint:  f/u discuss vit d level drawn at last ov in may.med change to ziac.  History of Present Illness: Still dizzy of and on. Feels dizzy but no los of balance. Heart may race. Pt does not note this. Has had MSK pain in back after exertion. No chest pain reports  Hypertension Follow-Up      This is a 77 year old woman who presents for Hypertension follow-up.  The patient reports lightheadedness and fatigue, but denies urinary frequency, headaches, edema, impotence, and rash.  The patient denies the following associated symptoms: chest pain, chest pressure, exercise intolerance, dyspnea, palpitations, syncope, leg edema, and pedal edema.  Compliance with medications (by patient report) has been near 100%.  The patient reports that dietary compliance has been excellent.  The patient reports exercising daily.      Past Surgical History:    Lumbar laminectomy    Lumbar fusion    colon polyp    colon 2006   Family History:    father    Family History Lung cancer    mother    Family History of Colon CA 1st degree relative <60  Social History:    Retired    Single    Never Smoked   Risk Factors:  Tobacco use:  never   Review of Systems  The patient denies anorexia, fever, weight loss, vision loss, decreased hearing, hoarseness, chest pain, syncope, dyspnea on exhertion, peripheral edema, hemoptysis, abdominal pain, severe indigestion/heartburn, muscle weakness, suspicious skin lesions, and difficulty walking.         HPI is only positive for lightheaded... occasionaly   Physical Exam  General:     well-developed and well-nourished.    Head:     normocephalic.   Eyes:     pupils equal and pupils round.   Ears:     R ear normal and L ear normal.   Nose:     no external deformity.   Mouth:     pharynx pink and moist.   Neck:     supple.   Lungs:     normal respiratory effort and no intercostal retractions.   Heart:     normal rate and regular rhythm.   Abdomen:     soft and non-tender.   Msk:     enlarged MCP joints, enlarged PIP joints, enlarged DIP joints, and ulnar deviation of fingers.   Pulses:     R and L carotid,radial,femoral,dorsalis pedis and posterior tibial pulses are full and equal bilaterally Extremities:     No clubbing, cyanosis, edema, or deformity noted with normal full range of motion of all joints.   Neurologic:     alert & oriented X3 and cranial nerves II-XII intact.      Impression & Recommendations:  Problem # 1:  OSTEOPOROSIS (ICD-733.00)  Her updated medication list for this problem includes:    Oscal 500/200 D-3 500-200 Mg-unit Tabs (Calcium-vitamin d) ..... Once daily  Low vit d   need ot start 1000 international units of Vit d3  Problem # 2:  IRRITABLE BOWEL SYNDROME (ICD-564.1) stable no new meds  Problem # 3:  DIVERTICULOSIS, COLON (ICD-562.10) had colon in 2006 and has no symptoms at present. High fiber  Problem # 4:  DEGENERATIVE JOINT DISEASE, MILD (ICD-715.90) sig hand OA Her updated medication list for this problem includes:    Mobic 7.5 Mg Tabs (Meloxicam) .Marland Kitchen... As needed Discussed use of medications, application of heat or cold, and exercises.   Problem # 5:  HYPERTENSION (ICD-401.9)  The following medications were removed from the medication list:    Atenolol 25 Mg Tabs (Atenolol) .Marland Kitchen... 1/2 every day  Her updated medication list for this problem includes:    Ziac 5-6.25 Mg Tabs (Bisoprolol-hydrochlorothiazide) ..... Once daily  BP today: 140/74  Orders: TLB-BMP (Basic Metabolic Panel-BMET) (80048-METABOL)   Complete Medication List: 1)  Osteo  Bi-flex Regular Strength 250-200 Mg Tabs (Glucosamine-chondroitin) .... Once daily 2)  Oscal 500/200 D-3 500-200 Mg-unit Tabs (Calcium-vitamin d) .... Once daily 3)  Fish Oil Oil (Fish oil) .... Once daily 4)  Ziac 5-6.25 Mg Tabs (Bisoprolol-hydrochlorothiazide) .... Once daily 5)  Mobic 7.5 Mg Tabs (Meloxicam) .... As needed   Patient Instructions: 1)  Vit D3 1000 international units daily 2)  Take one a day and we will check the D3 levels in 3 months 3)  Please schedule a follow-up appointment in 3 months. 4)  Lab one week prior Vit D level    733.00          Appended Document: Orders Update    Clinical Lists Changes  Orders: Added new Service order of Venipuncture [phone]) - Signed

## 2010-11-24 NOTE — Assessment & Plan Note (Signed)
Summary: 3 months ov/nta   Vital Signs:  Patient Profile:   77 Years Old Female Height:     64 inches Weight:      118 pounds Temp:     98.5 degrees F oral Resp:     14 per minute BP sitting:   130 / 70  (left arm)  Vitals Entered By: Willy Eddy, LPN (December 04, 2007 10:05 AM)                 Chief Complaint:  roa/requests vita d level results/ha pmeumonia vax today/refuses dt.  History of Present Illness: L[place] did the vaginal [place] and the ovaries looked "ok" Current Problems:  DEGENERATIVE JOINT DISEASE, MILD (ICD-715.90) stable IRRITABLE BOWEL SYNDROME (ICD-564.1)  and use of the antiinflamatory drug class HYPERTENSION (ICD-401.9) OSTEOPOROSIS (ICD-733.00) DIVERTICULOSIS, COLON (ICD-562.10)    Hypertension History:      She complains of headache, but denies chest pain, palpitations, dyspnea with exertion, orthopnea, PND, peripheral edema, visual symptoms, neurologic problems, syncope, and side effects from treatment.  Further comments include: atypical that  is relieved with movement....        Positive major cardiovascular risk factors include female age 23 years old or older and hypertension.  Negative major cardiovascular risk factors include negative family history for ischemic heart disease and non-tobacco-user status.     Current Allergies (reviewed today): No known allergies  Updated/Current Medications (including changes made in today's visit):  OSCAL 500/200 D-3 500-200 MG-UNIT  TABS (CALCIUM-VITAMIN D) once daily FISH OIL   OIL (FISH OIL) 4 po daily ZIAC 5-6.25 MG  TABS (BISOPROLOL-HYDROCHLOROTHIAZIDE) once daily MOBIC 7.5 MG  TABS (MELOXICAM) AS needed VITAMIN D 1000 UNIT  CAPS (CHOLECALCIFEROL) one by mouth daily    Family History:    Reviewed history from 09/04/2007 and no changes required:       father       Family History Lung cancer       mother       Family History of Colon CA 1st degree relative <60       Family History Ovarian  cancer sister          Social History:    Reviewed history from 05/30/2007 and no changes required:       Retired       Single       Never Smoked   Risk Factors:  Passive smoke exposure:  no Drug use:  no HIV high-risk behavior:  no  Family History Risk Factors:    Family History of MI in females < 33 years old:  no    Family History of MI in males < 34 years old:  no   Review of Systems  The patient denies anorexia, fever, weight loss, vision loss, decreased hearing, hoarseness, chest pain, syncope, dyspnea on exhertion, peripheral edema, prolonged cough, hemoptysis, abdominal pain, melena, hematochezia, severe indigestion/heartburn, hematuria, incontinence, genital sores, muscle weakness, suspicious skin lesions, transient blindness, difficulty walking, depression, unusual weight change, abnormal bleeding, enlarged lymph nodes, angioedema, and breast masses.     Physical Exam  General:     Well-developed,well-nourished,in no acute distress; alert,appropriate and cooperative throughout examination Head:     Normocephalic and atraumatic without obvious abnormalities. No apparent alopecia or balding. Eyes:     No corneal or conjunctival inflammation noted. EOMI. Perrla. Funduscopic exam benign, without hemorrhages, exudates or papilledema. Vision grossly normal. Nose:     External nasal examination shows no deformity or inflammation. Nasal  mucosa are pink and moist without lesions or exudates. Neck:     No deformities, masses, or tenderness noted. Lungs:     Normal respiratory effort, chest expands symmetrically. Lungs are clear to auscultation, no crackles or wheezes. Heart:     Normal rate and regular rhythm. S1 and S2 normal without gallop, murmur, click, rub or other extra sounds. Abdomen:     Bowel sounds positive,abdomen soft and non-tender without masses, organomegaly or hernias noted.    Impression & Recommendations:  Problem # 1:  CHEST PAIN, ATYPICAL  (ICD-786.59) trial of PPI fdr 30 days and if the the chest pain does not change in character we will need to do a stress test  Problem # 2:  IRRITABLE BOWEL SYNDROME (ICD-564.1) Assessment: Unchanged  Problem # 3:  HYPERTENSION (ICD-401.9) Assessment: Unchanged  Her updated medication list for this problem includes:    Ziac 5-6.25 Mg Tabs (Bisoprolol-hydrochlorothiazide) ..... Once daily  BP today: 130/70 Prior BP: 136/80 (09/04/2007)  Prior 10 Yr Risk Heart Disease: Not enough information (09/04/2007)  Labs Reviewed: Creat: 0.7 (09/04/2007)   Problem # 4:  OSTEOPOROSIS (ICD-733.00) pt to schedule her BD Her updated medication list for this problem includes:    Oscal 500/200 D-3 500-200 Mg-unit Tabs (Calcium-vitamin d) ..... Once daily  and vit D  Her updated medication list for this problem includes:    Oscal 500/200 D-3 500-200 Mg-unit Tabs (Calcium-vitamin d) ..... Once daily    Vitamin D 1000 Unit Caps (Cholecalciferol) ..... One by mouth daily   Complete Medication List: 1)  Oscal 500/200 D-3 500-200 Mg-unit Tabs (Calcium-vitamin d) .... Once daily 2)  Fish Oil Oil (Fish oil) .... 4 po daily 3)  Ziac 5-6.25 Mg Tabs (Bisoprolol-hydrochlorothiazide) .... Once daily 4)  Mobic 7.5 Mg Tabs (Meloxicam) .... As needed 5)  Vitamin D 1000 Unit Caps (Cholecalciferol) .... One by mouth daily  Other Orders: Pneumococcal Vaccine (01093) Admin 1st Vaccine (23557)  Hypertension Assessment/Plan:      The patient's hypertensive risk group is category B: At least one risk factor (excluding diabetes) with no target organ damage.  Today's blood pressure is 130/70.  Her blood pressure goal is < 140/90.   Patient Instructions: 1)  call to schedule the bone density 2)  Please schedule a follow-up appointment in 4 months. 3)  Hepatic Panel prior to visit, ICD-9:995.20 4)  Lipid Panel prior to visit, ICD-9:272.4    ]    Pneumovax Vaccine    Vaccine Type: Pneumovax    Site:  left deltoid    Mfr: Merck    Dose: 0.25 ml    Route: IM    Given by: Willy Eddy, LPN    Exp. Date: 12/21/2008    Lot #: [id number]

## 2010-11-24 NOTE — Progress Notes (Signed)
Summary: dysuria/  vaginal irritation  Phone Note Call from Patient   Caller: Patient Call For: Stacie Glaze MD Summary of Call: Pt is calling in to let Dr. Lovell Sheehan know how she is doing........Marland KitchenFeels a little better, but she is still having some dysuria, and complains mostly of vaginal irritatiion and feeling completely raw (entire vagina).  No discharge of itching. [name] Initial call taken by: Lynann Beaver CMA,  December 05, 2009 9:06 AM  Follow-up for Phone Call        add metrogel vaginal gel nightly for 7 night Follow-up by: Stacie Glaze MD,  December 05, 2009 9:36 AM    New/Updated Medications: METROGEL-VAGINAL 0.75 % GEL (METRONIDAZOLE) Use as directed nightly x 7 Prescriptions: METROGEL-VAGINAL 0.75 % GEL (METRONIDAZOLE) Use as directed nightly x 7  #1 tube x 0   Entered by:   Lynann Beaver CMA   Authorized by:   Stacie Glaze MD   Signed by:   Lynann Beaver CMA on 12/05/2009   Method used:   Electronically to        [facility].* (retail)       [address]       [place], [place]  [address]       Ph: [phone]       Fax: [phone]   RxID:   [id number]  Pt notified.

## 2010-11-24 NOTE — Progress Notes (Signed)
Summary: rx question  Phone Note From Pharmacy Call back at [phone]   Caller: [facility] Call For: jenkins  Summary of Call: analpram hc is $68 a tube can you prescbe something else that would be less expensive.  if internal they suggest hydrocortizone suppositories If external the 2.5 cream  Initial call taken by: Roselle Locus,  April 04, 2008 12:39 PM

## 2010-11-24 NOTE — Consult Note (Signed)
Summary: Dr Nicholas Lose note  Dr Nicholas Lose note   Imported By: Kassie Mends 12/12/2007 15:00:51  _____________________________________________________________________  External Attachment:    Type:   Image     Comment:   Dr Nicholas Lose note

## 2010-11-24 NOTE — Assessment & Plan Note (Signed)
Summary: 6 month rov/pt will come in fasting/njr   Vital Signs:  Patient Profile:   77 Years Old Female Height:     64 inches Weight:      116 pounds Temp:     98.4 degrees F oral Pulse rate:   72 / minute Resp:     14 per minute BP sitting:   130 / 78  (left arm)  Vitals Entered By: Willy Eddy, LPN (October 02, 2008 7:59 AM)                 Chief Complaint:  roa.  History of Present Illness: had right hip replacment August 20  Follow-Up Visit      This is a 77 year old woman who presents for Follow-up visit.  The patient denies chest pain, palpitations, dizziness, syncope, low blood sugar symptoms, high blood sugar symptoms, edema, SOB, DOE, PND, and orthopnea.  Since the last visit the patient notes a recent hospitilization.  The patient reports taking meds as prescribed, monitoring BP, and dietary compliance.  When questioned about possible medication side effects, the patient notes none.      Current Allergies: No known allergies   Past Medical History:    Reviewed history from 05/30/2007 and no changes required:       Diverticulosis, colon       Osteoporosis       Hypertension       IBS 564.1  Past Surgical History:    Reviewed history from 05/30/2007 and no changes required:       Lumbar laminectomy       Lumbar fusion       colon polyp       colon 2006       Total hip replacement right   Family History:    Reviewed history from 09/04/2007 and no changes required:       father       Family History Lung cancer       mother       Family History of Colon CA 1st degree relative <60       Family History Ovarian cancer sister          Social History:    Reviewed history from 05/30/2007 and no changes required:       Retired       Single       Never Smoked    Review of Systems  The patient denies anorexia, fever, weight loss, weight gain, vision loss, decreased hearing, hoarseness, chest pain, syncope, dyspnea on exertion, peripheral edema,  prolonged cough, headaches, hemoptysis, abdominal pain, melena, hematochezia, severe indigestion/heartburn, hematuria, incontinence, genital sores, muscle weakness, suspicious skin lesions, transient blindness, difficulty walking, depression, unusual weight change, abnormal bleeding, enlarged lymph nodes, angioedema, and breast masses.     Physical Exam  General:     Well-developed,well-nourished,in no acute distress; alert,appropriate and cooperative throughout examination Eyes:     No corneal or conjunctival inflammation noted. EOMI. Perrla. Funduscopic exam benign, without hemorrhages, exudates or papilledema. Vision grossly normal. Nose:     External nasal examination shows no deformity or inflammation. Nasal mucosa are pink and moist without lesions or exudates. Mouth:     pharynx pink and moist.   Neck:     No deformities, masses, or tenderness noted. Lungs:     Normal respiratory effort, chest expands symmetrically. Lungs are clear to auscultation, no crackles or wheezes. Heart:     Normal rate and  regular rhythm. S1 and S2 normal without gallop, murmur, click, rub or other extra sounds. Abdomen:     Bowel sounds positive,abdomen soft and non-tender without masses, organomegaly or hernias noted. Msk:     no joint tenderness, no joint swelling, no joint warmth, no joint deformities, and decreased ROM.   Neurologic:     alert & oriented X3, gait normal, and finger-to-nose normal.      Impression & Recommendations:  Problem # 1:  Preventive Health Care (ICD-V70.0) Assessment: Unchanged Flu Vaccine Consent Questions     Do you have a history of severe allergic reactions to this vaccine? no    Any prior history of allergic reactions to egg and/or gelatin? no    Do you have a sensitivity to the preservative Thimersol? no    Do you have a past history of Guillan-Barre Syndrome? no    Do you currently have an acute febrile illness? no    Have you ever had a severe reaction to  latex? no    Vaccine information given and explained to patient? yes    Are you currently pregnant? no   Do you have Asthma? no   Lot Number: [id number] p1   Exp Date:01/22/2009   Site Given  Deltoid   Problem # 2:  HAIR LOSS (ICD-704.00) Assessment: New check diet, protein and tsh Orders: Venipuncture (25956) TLB-TSH (Thyroid Stimulating Hormone) (84443-TSH) TLB-Protein, Total (84155-TP) T- * Misc. Laboratory test [phone])   Problem # 3:  DEGENERATIVE JOINT DISEASE, MILD (ICD-715.90) Assessment: Unchanged post hip replacement stable Her updated medication list for this problem includes:    Mobic 7.5 Mg Tabs (Meloxicam) .Marland Kitchen... As needed Discussed use of medications, application of heat or cold, and exercises.   Problem # 4:  DIVERTICULOSIS, COLON (ICD-562.10) Assessment: Unchanged stable  Problem # 5:  HYPERTENSION (ICD-401.9) Assessment: Unchanged  Her updated medication list for this problem includes:    Ziac 5-6.25 Mg Tabs (Bisoprolol-hydrochlorothiazide) ..... Once daily  BP today: 130/78 Prior BP: 130/70 (04/02/2008)  Prior 10 Yr Risk Heart Disease: Not enough information (09/04/2007)  Labs Reviewed: Creat: 0.7 (09/04/2007) Chol: 198 (03/26/2008)   HDL: 47.9 (03/26/2008)   LDL: 133 (03/26/2008)   TG: 88 (03/26/2008)   Complete Medication List: 1)  Oscal 500/200 D-3 500-200 Mg-unit Tabs (Calcium-vitamin d) .... Once daily 2)  Fish Oil Concentrate 1000 Mg Caps (Omega-3 fatty acids) .... Two by mouth bid 3)  Ziac 5-6.25 Mg Tabs (Bisoprolol-hydrochlorothiazide) .... Once daily 4)  Mobic 7.5 Mg Tabs (Meloxicam) .... As needed 5)  Analpram-hc 1-2.5 % Crea (Hydrocortisone ace-pramoxine) .... Apply q hs for 7 days 6)  Vitamin D 43329 Unit Caps (Ergocalciferol) .... One by mouth weekly  Other Orders: H1N1 vaccine (J1884) Influenza A (H1N1) w/ Phy couseling (Z6606) TLB-Lipid Panel (80061-LIPID)   Patient Instructions: 1)  for the hair loss 2)  soy milk and biotin and    3)  Ulta   Protein rx shampoo ( [facility] or phytogen) 4)  Please schedule a follow-up appointment in 6 months.   Prescriptions: FISH OIL CONCENTRATE 1000 MG CAPS (OMEGA-3 FATTY ACIDS) two by mouth BID  #120 x 11   Entered and Authorized by:   Stacie Glaze MD   Signed by:   Stacie Glaze MD on 10/02/2008   Method used:   Print then Give to Patient   RxID:   [id number] ZIAC 5-6.25 MG  TABS (BISOPROLOL-HYDROCHLOROTHIAZIDE) once daily  #30 Each x 6   Entered by:  Willy Eddy, LPN   Authorized by:   Stacie Glaze MD   Signed by:   Willy Eddy, LPN on 14/78/2956   Method used:   Electronically to        [facility] (retail)       [address]       [place], [place]  [address]       Ph: [phone]       Fax: [phone]   RxID:   [id number]  ]  Appended Document: Orders Update    Clinical Lists Changes  Orders: Added new Service order of Specimen Handling ([id number]) - Signed

## 2010-11-24 NOTE — Progress Notes (Signed)
Summary: RX requests  Phone Note Call from Patient   Caller: Patient Call For: Stacie Glaze MD Summary of Call: Pt is taking sinus tylenol for green productive cough and hoarseness x 8 days.  ? fever? Also, having bleeding hemorrhoids and needs RX. Nicolette Bang [facility] Initial call taken by: Lynann Beaver CMA,  January 15, 2009 8:52 AM    New/Updated Medications: ZITHROMAX Z-PAK 250 MG TABS (AZITHROMYCIN) take as directed ATUSS DS 30-4-30 MG/5ML SUSP (PSEUDOEPHED HCL-CPM-DM HBR TAN) 1 tsp every 12 hours   Prescriptions: ATUSS DS 30-4-30 MG/5ML SUSP (PSEUDOEPHED HCL-CPM-DM HBR TAN) 1 tsp every 12 hours  #60z x 0   Entered by:   Willy Eddy, LPN   Authorized by:   Stacie Glaze MD   Signed by:   Willy Eddy, LPN on 32/20/2542   Method used:   Electronically to        [facility].* (retail)       [address]       [place], [place]  [address]       Ph: [phone]       Fax: [phone]   RxID:   [id number] ZITHROMAX Z-PAK 250 MG TABS (AZITHROMYCIN) take as directed  #1 x 0   Entered by:   Willy Eddy, LPN   Authorized by:   Stacie Glaze MD   Signed by:   Willy Eddy, LPN on 81/82/9937   Method used:   Electronically to        [facility].* (retail)       [address]       [place], [place]  [address]       Ph: [phone]       Fax: [phone]   RxID:   [id number] ANALPRAM-HC 1-2.5 %  CREA (HYDROCORTISONE ACE-PRAMOXINE) apply q HS for 7 days  #1 tube x 3   Entered by:   Willy Eddy, LPN   Authorized by:   Stacie Glaze MD   Signed by:   Willy Eddy, LPN on 67/61/9509   Method used:   Electronically to        [facility] (retail)       [address]       [place], [place]  [address]       Ph: [phone]       Fax: [phone]   RxID:   [id number]   Appended Document: RX requests pt  informed

## 2010-11-24 NOTE — Assessment & Plan Note (Signed)
Summary: 6 month rov/njr pt rsc/njr   Vital Signs:  Patient profile:   77 year old female Height:      64 inches Weight:      118 pounds Temp:     98.2 degrees F oral Pulse rate:   72 / minute Resp:     14 per minute BP sitting:   140 / 80  (left arm)  Vitals Entered By: Willy Eddy, LPN (June 12, 2010 2:19 PM)  Primary Care Provider:  Stacie Glaze MD   History of Present Illness: Last  colon was 5 years ago with Victorino Dike has noted bright red blood with wiping and dark stools has even noted "dripping from the rectum when she urinates has a hx of hemorrhoids and has some active hemmorhoidal bleeding hx of surgery for partial resection of large bowel for "tumor" hx of "polyps"   Problems Prior to Update: 1)  Anemia  (ICD-285.9) 2)  Colonic Polyps, Adenomatous, Benign  (ICD-569.0) 3)  Urethral Caruncle  (ICD-599.3) 4)  Hyperlipidemia, Mild  (ICD-272.4) 5)  Hair Loss  (ICD-704.00) 6)  Internal Hemorrhoids With Other Complication  (ICD-455.2) 7)  Chest Pain, Atypical  (ICD-786.59) 8)  Neoplasm, Malignant, Ovary, Family Hx  (ICD-V16.41) 9)  Family History of Colon Ca 1st Degree Relative <60  (ICD-V16.0) 10)  Degenerative Joint Disease, Mild  (ICD-715.90) 11)  Irritable Bowel Syndrome  (ICD-564.1) 12)  Hypertension  (ICD-401.9) 13)  Osteoporosis  (ICD-733.00) 14)  Diverticulosis, Colon  (ICD-562.10)  Medications Prior to Update: 1)  Oscal 500/200 D-3 500-200 Mg-Unit  Tabs (Calcium-Vitamin D) .... Once Daily 2)  Fish Oil Concentrate 1000 Mg Caps (Omega-3 Fatty Acids) .... Two By Mouth Bid 3)  Bisoprolol-Hydrochlorothiazide 5-6.25 Mg Tabs (Bisoprolol-Hydrochlorothiazide) .... One By Mouth Daily 4)  Mobic 15 Mg Tabs (Meloxicam) .Marland Kitchen.. 1 Once Daily As Needed 5)  Analpram-Hc 1-2.5 %  Crea (Hydrocortisone Ace-Pramoxine) .... Apply Q Hs For 7 Days 6)  Vitamin D 16109 Unit  Caps (Ergocalciferol) .... One By Mouth Weekly 7)  B Complex  Tabs (B Complex Vitamins) .... One  By Mouth Daily 8)  Osteo Bi-Flex Regular Strength 250-200 Mg Tabs (Glucosamine-Chondroitin) .Marland Kitchen.. 1 Once Daily 9)  Cephalexin 500 Mg Caps (Cephalexin) .... One By Mouth Three Times A Day For 10 Days 10)  Metrogel-Vaginal 0.75 % Gel (Metronidazole) .... Use As Directed Nightly X 7  Current Medications (verified): 1)  Oscal 500/200 D-3 500-200 Mg-Unit  Tabs (Calcium-Vitamin D) .... Once Daily 2)  Fish Oil Concentrate 1000 Mg Caps (Omega-3 Fatty Acids) .... Two By Mouth Bid 3)  Bisoprolol-Hydrochlorothiazide 5-6.25 Mg Tabs (Bisoprolol-Hydrochlorothiazide) .... One By Mouth Daily 4)  Mobic 15 Mg Tabs (Meloxicam) .Marland Kitchen.. 1 Once Daily As Needed 5)  Analpram-Hc 1-2.5 %  Crea (Hydrocortisone Ace-Pramoxine) .... Apply Q Hs For 7 Days 6)  Vitamin D 60454 Unit  Caps (Ergocalciferol) .... One By Mouth Weekly 7)  B Complex  Tabs (B Complex Vitamins) .... One By Mouth Daily 8)  Osteo Bi-Flex Regular Strength 250-200 Mg Tabs (Glucosamine-Chondroitin) .Marland Kitchen.. 1 Once Daily 9)  Cephalexin 500 Mg Caps (Cephalexin) .... One By Mouth Three Times A Day For 10 Days 10)  Metrogel-Vaginal 0.75 % Gel (Metronidazole) .... Use As Directed Nightly X 7  Allergies: No Known Drug Allergies  Past History:  Family History: Last updated: 09/04/2007 father Family History Lung cancer mother Family History of Colon CA 1st degree relative <60 Family History Ovarian cancer sister    Social History: Last updated:  05/30/2007 Retired Single Never Smoked  Risk Factors: Smoking Status: never (12/02/2009) Passive Smoke Exposure: no (12/02/2009)  Past medical, surgical, family and social histories (including risk factors) reviewed, and no changes noted (except as noted below).  Past Medical History: Reviewed history from 05/30/2007 and no changes required. Diverticulosis, colon Osteoporosis Hypertension IBS 564.1  Past Surgical History: Lumbar laminectomy Lumbar fusion partial resection of colon for "tumor" colon  polyp colon 2006 Total hip replacement right  Family History: Reviewed history from 09/04/2007 and no changes required. father Family History Lung cancer mother Family History of Colon CA 1st degree relative <60 Family History Ovarian cancer sister    Social History: Reviewed history from 05/30/2007 and no changes required. Retired Single Never Smoked  Review of Systems       The patient complains of weight loss.  The patient denies anorexia, fever, weight gain, vision loss, decreased hearing, hoarseness, chest pain, syncope, dyspnea on exertion, peripheral edema, prolonged cough, headaches, hemoptysis, abdominal pain, melena, hematochezia, severe indigestion/heartburn, hematuria, incontinence, genital sores, muscle weakness, suspicious skin lesions, transient blindness, difficulty walking, depression, unusual weight change, abnormal bleeding, enlarged lymph nodes, angioedema, breast masses, and testicular masses.         no abdominal pain or diarrhea  Physical Exam  General:  Well-developed,well-nourished,in no acute distress; alert,appropriate and cooperative throughout examination Head:  Normocephalic and atraumatic without obvious abnormalities. No apparent alopecia or balding. Eyes:  pupils equal and pupils round.   Ears:  R ear normal and L ear normal.   Nose:  no external deformity and no nasal discharge.   Mouth:  pharynx pink and moist and no erythema.   Neck:  No deformities, masses, or tenderness noted. Lungs:  Normal respiratory effort, chest expands symmetrically. Lungs are clear to auscultation, no crackles or wheezes. Heart:  Normal rate and regular rhythm. S1 and S2 normal without gallop, murmur, click, rub or other extra sounds. Abdomen:  Bowel sounds positive,abdomen soft and non-tender without masses, organomegaly or hernias noted.   Impression & Recommendations:  Problem # 1:  COLONIC POLYPS, ADENOMATOUS, BENIGN (ICD-569.0) referral to Dr Jarold Motto needs  colon in 2 years prior GI was [facility] Orders: Venipuncture (04540) TLB-CEA (Carcinoembryonic Antigen) (82378-CEA) TLB-CBC Platelet - w/Differential (85025-CBCD)  Problem # 2:  ANEMIA (ICD-285.9) moniter due to bleeding Orders: TLB-CBC Platelet - w/Differential (85025-CBCD)  TSH: 1.06 (10/01/2009)  Problem # 3:  INTERNAL HEMORRHOIDS WITH OTHER COMPLICATION (ICD-455.2) may be the source of the blood but the family hx has the pt woried reassuring fact is a colon in 2006 wich had onky behigh polyps diverticular bleed is less likely due to amount Orders: TLB-CBC Platelet - w/Differential (85025-CBCD)  Problem # 4:  DIVERTICULOSIS, COLON (ICD-562.10) considered in the eitilogy of the rectal bleeding Colonoscopy:  Problem # 5:  HYPERTENSION (ICD-401.9) Assessment: Unchanged  Her updated medication list for this problem includes:    Bisoprolol-hydrochlorothiazide 5-6.25 Mg Tabs (Bisoprolol-hydrochlorothiazide) ..... One by mouth daily  Orders: TLB-CBC Platelet - w/Differential (85025-CBCD)  BP today: 140/80 Prior BP: 134/70 (12/02/2009)  Prior 10 Yr Risk Heart Disease: Not enough information (09/04/2007)  Labs Reviewed: K+: 3.9 (10/01/2009) Creat: : 0.8 (10/01/2009)   Chol: 187 (10/01/2009)   HDL: 53.40 (10/01/2009)   LDL: 112 (10/01/2009)   TG: 109.0 (10/01/2009)  Complete Medication List: 1)  Oscal 500/200 D-3 500-200 Mg-unit Tabs (Calcium-vitamin d) .... Once daily 2)  Fish Oil Concentrate 1000 Mg Caps (Omega-3 fatty acids) .... Two by mouth bid 3)  Bisoprolol-hydrochlorothiazide 5-6.25 Mg Tabs (  Bisoprolol-hydrochlorothiazide) .... One by mouth daily 4)  Mobic 15 Mg Tabs (Meloxicam) .Marland Kitchen.. 1 once daily as needed 5)  Analpram-hc 1-2.5 % Crea (Hydrocortisone ace-pramoxine) .... Apply q hs for 7 days 6)  Vitamin D 16109 Unit Caps (Ergocalciferol) .... One by mouth weekly 7)  B Complex Tabs (B complex vitamins) .... One by mouth daily 8)  Osteo Bi-flex Regular Strength 250-200 Mg  Tabs (Glucosamine-chondroitin) .Marland Kitchen.. 1 once daily 9)  Cephalexin 500 Mg Caps (Cephalexin) .... One by mouth three times a day for 10 days 10)  Metrogel-vaginal 0.75 % Gel (Metronidazole) .... Use as directed nightly x 7  Patient Instructions: 1)  appointment with Dr Jarold Motto is Tuesday at 11 AM on elam 2)  Please schedule a follow-up appointment in 3 months.  Appended Document: Orders Update    Clinical Lists Changes  Orders: Added new Service order of Specimen Handling ([id number]) - Signed

## 2010-11-24 NOTE — Progress Notes (Signed)
   Appended Document:  lmom for pt that was normal

## 2010-11-24 NOTE — Procedures (Signed)
Summary: Colonoscopy  Patient: [name] Note: All result statuses are Final unless otherwise noted.  Tests: (1) Colonoscopy (COL)   COL Colonoscopy           DONE     [facility]     [address] [place], [place]  [address]           COLONOSCOPY PROCEDURE REPORT           PATIENT:  [name], [name]  MR#:  [id number]     BIRTHDATE:  Dec 17, 1931, 78 yrs. old  GENDER:  female           ENDOSCOPIST:  Barbette Hair. Arlyce Dice, MD     Referred by:           PROCEDURE DATE:  07/06/2010     PROCEDURE:  Colon w/ banding hemorrhoi     ASA CLASS:  Class II     INDICATIONS:  1) rectal bleeding  2) family history of colon     cancer mother and brother           MEDICATIONS:   Fentanyl 125 mcg IV, Versed 12.5 mg, glucagon 0.01     mg           DESCRIPTION OF PROCEDURE:   After the risks benefits and     alternatives of the procedure were thoroughly explained, informed     consent was obtained.  Digital rectal exam was performed and     revealed prolasped hemorrhoids.  Grade 3 hemorroids The Pentax     Colonoscope [id number] endoscope was introduced through the anus and     advanced to the cecum, which was identified by both the appendix     and ileocecal valve, without limitations.  The quality of the prep     was excellent, using MoviPrep.  The instrument was then slowly     withdrawn as the colon was fully examined.     <<PROCEDUREIMAGES>>           FINDINGS:  Severe diverticulosis was found in the sigmoid colon.     Severe diverticular changes with lumenal narrowing, spasm, and     muscular hypertrophy. Unable to pass adult colonoscope through     area. Area was successfully traversed with a pediatric colonoscope     (see image13 and image14).  Internal hemorrhoids were found. 3     large internal hemorrhoidal bundles. 7 bands were placed     circumferentially above the dentate line (see image2).  This was     otherwise a normal examination of the colon (see  image5, image6,     image8, image9, image10, and image16).   Retroflexed views in the     rectum revealed no abnormalities.    The time to cecum =  minutes.     The scope was then withdrawn (time =  min) from the patient and     the procedure completed.           COMPLICATIONS:  None           ENDOSCOPIC IMPRESSION:     1) Severe diverticulosis in the sigmoid colon     2) Internal hemorrhoids - s/p band ligation     3) Otherwise normal examination     RECOMMENDATIONS:     1) Given your significant family history of colon cancer, you     should have a repeat colonoscopy in 5 years     2)  call office next 1-3 days to schedule followup visit in 3     weeks - Dr. Arlyce Dice           REPEAT EXAM:  In 5 year(s) for Colonoscopy - Dr. Jarold Motto           ______________________________     Barbette Hair. Arlyce Dice, MD           CC: Sheryn Bison, MD, Stacie Glaze, MD           n.     Rosalie Doctor:   Barbette Hair. Kaplan at 07/06/2010 01:24 PM           Mick Sell, [phone]  Note: An exclamation mark (!) indicates a result that was not dispersed into the flowsheet. Document Creation Date: 07/06/2010 1:25 PM _______________________________________________________________________  (1) Order result status: Final Collection or observation date-time: 07/06/2010 13:11 Requested date-time:  Receipt date-time:  Reported date-time:  Referring Physician:   Ordering Physician: Melvia Heaps [phone]) Specimen Source:  Source: Launa Grill Order Number: [id number] Lab site:   Appended Document: Colonoscopy Recall is in IDX for 06/2015.

## 2010-11-24 NOTE — Procedures (Signed)
Summary: Prep/[facility]  Prep/[facility]   Imported By: Lester [place] 06/19/2010 10:37:00  _____________________________________________________________________  External Attachment:    Type:   Image     Comment:   External Document

## 2010-11-24 NOTE — Letter (Signed)
Summary: New Patient letter  [facility]  [address] [place], [place] [address]   Phone: [phone]  Fax: [phone]       05/11/2010 MRN: [id number]  [name] [address] [place], [place]  [address]  Dear Ms. Elk,  Welcome to the Gastroenterology Division at [facility].    You are scheduled to see Dr.  Jarold Motto on 06-23-10 at 10am on the 3rd floor at [facility], [address]  We ask that you try to arrive at our office 15 minutes prior to your appointment time to allow for check-in.  We would like you to complete the enclosed self-administered evaluation form prior to your visit and bring it with you on the day of your appointment.  We will review it with you.  Also, please bring a complete list of all your medications or, if you prefer, bring the medication bottles and we will list them.  Please bring your insurance card so that we may make a copy of it.  If your insurance requires a referral to see a specialist, please bring your referral form from your primary care physician.  Co-payments are due at the time of your visit and may be paid by cash, check or credit card.     Your office visit will consist of a consult with your physician (includes a physical exam), any laboratory testing he/she may order, scheduling of any necessary diagnostic testing (e.g. x-ray, ultrasound, CT-scan), and scheduling of a procedure (e.g. Endoscopy, Colonoscopy) if required.  Please allow enough time on your schedule to allow for any/all of these possibilities.    If you cannot keep your appointment, please call [phone] to cancel or reschedule prior to your appointment date.  This allows [place] the opportunity to schedule an appointment for another patient in need of care.  If you do not cancel or reschedule by 5 p.m. the business day prior to your appointment date, you will be charged a $50.00 late cancellation/no-show fee.    Thank you for  choosing [facility] for your medical needs.  We appreciate the opportunity to care for you.  Please visit [place] at our website  to learn more about our practice.                     Sincerely,                                                             The Gastroenterology Division

## 2010-11-24 NOTE — Procedures (Signed)
Summary: Colonoscopy   Colonoscopy  Procedure date:  09/19/2005  Findings:      Location:  [facility].   Patient Name: [name], [name] MRN:  Procedure Procedures: Colonoscopy CPT: [phone].  Personnel: Endoscopist: Ulyess Mort, MD.  Exam Location: Exam performed in Outpatient Clinic. Outpatient  Patient Consent: Procedure, Alternatives, Risks and Benefits discussed, consent obtained, from patient. Consent was obtained by the RN.  Indications  Surveillance of: Adenomatous Polyp(s).  History  Current Medications: Patient is not currently taking Coumadin.  Pre-Exam Physical: Performed Jul 19, 2002. Entire physical exam was normal. Rectal exam, Abdominal exam, Extremity exam, Mental status exam WNL.  Exam Exam: Extent of exam reached: Cecum, extent intended: Cecum.  The cecum was identified by appendiceal orifice and IC valve. Colon retroflexion performed. Images taken. ASA Classification: II. Tolerance: excellent.  Monitoring: Pulse and BP monitoring, Oximetry used. Supplemental O2 given.  Colon Prep Prep results: good.  Sedation Meds: Patient assessed and found to be appropriate for moderate (conscious) sedation. Fentanyl given IV. Versed given IV.  Findings - DIVERTICULOSIS: Descending Colon to Sigmoid Colon. ICD9: Diverticulosis: 562.10. Comments: mild ---moderate with very tortuous sigmoid colon.  - HEMORRHOIDS: Internal and External. Size: Grade II. ICD9: Hemorrhoids, Internal and  External: 455.6.   Assessment Abnormal examination, see findings above.  Diagnoses: 562.10: Diverticulosis.  455.6: Hemorrhoids, Internal and  External.   Events  Unplanned Interventions: No intervention was required.  Unplanned Events: There were no complications. Plans Medication Plan: Continue current medications.  Patient Education: Patient given standard instructions for: Diverticulosis. Hemorrhoids. Yearly hemoccult testing recommended.  Patient instructed to get routine colonoscopy every 7 years.  Disposition: After procedure patient sent to recovery. After recovery patient sent home.    This report was created from the original endoscopy report, which was reviewed and signed by the above listed endoscopist.

## 2010-11-24 NOTE — Progress Notes (Signed)
Summary: TRIAGE  Phone Note Call from Patient Call back at Home Phone [phone]   Caller: Patient Call For: Dr. Jarold Motto Reason for Call: Talk to Nurse Summary of Call: since [facility], pt has been passing blood rectally Initial call taken by: Vallarie Mare,  July 08, 2010 8:47 AM  Follow-up for Phone Call        Has not had b.m. since  hemorrhoid banding on 07/06/10 but is seeing sm. amt. of B.R.B on tissue when she wipes. Told that is o.k. and to call back if lg. amt . of bleeding occurs. Follow-up by: Teryl Lucy RN,  July 08, 2010 8:57 AM  Additional Follow-up for Phone Call Additional follow up Details #1::        dr. Arlyce Dice did banding and send to him....  Additional Follow-up by: Mardella Layman MD Clementeen Graham,  July 08, 2010 9:10 AM    Additional Follow-up for Phone Call Additional follow up Details #2::    continue with tucks pads, warm soaks Follow-up by: Louis Meckel MD,  July 09, 2010 2:38 PM  Additional Follow-up for Phone Call Additional follow up Details #3:: Details for Additional Follow-up Action Taken: Pt. contacted re:Dr.Kaplan's orders for rectal bleeding and she said she was just going to call because she had a very lg. amt of B.RB in toilet so much that she could not " seewhat I did". Has increase in rectal discomfort says she just feels raw around the rectum.  continue warm soaks. c/b Mon and refer to me      RK pt.ntfd. Additional Follow-up by: Teryl Lucy RN,  July 10, 2010 9:22 AM   Condition update-She states she is improving, but still feels a little "raw" in her rectal area and has smear of blood on tissue after she wipes. She will continue the Sitz baths and try Tucks pads to her rectum. She will follow-up with Dr.Kaplan on 07-29-10 at 9:45am, then return to Dr.Pattersons care after that. Pt. instructed to call back as needed. Laureen Ochs LPN  July 13, 2010 8:50 AM  OK  RK

## 2010-11-24 NOTE — Assessment & Plan Note (Signed)
Summary: 6 month rov/njr   Vital Signs:  Patient profile:   77 year old female Height:      64 inches Weight:      122 pounds BMI:     21.02 Temp:     98.2 degrees F oral Pulse rate:   72 / minute Resp:     14 per minute BP sitting:   136 / 72  (left arm)  Vitals Entered By: Willy Eddy, LPN (October 08, 2009 10:38 AM)  CC:  roa labs.  History of Present Illness: weight stable  Hyperlipidemia Follow-Up      This is a 77 year old woman who presents for Hyperlipidemia follow-up.  The patient denies muscle aches, GI upset, abdominal pain, flushing, itching, constipation, diarrhea, and fatigue.  The patient denies the following symptoms: chest pain/pressure, exercise intolerance, dypsnea, palpitations, syncope, and pedal edema.  Compliance with medications (by patient report) has been near 100%.  Dietary compliance has been excellent.  The patient reports exercising 3-4X per week.  Adjunctive measures currently used by the patient include niacin and fish oil supplements.    Problems Prior to Update: 1)  Hyperlipidemia, Mild  (ICD-272.4) 2)  Hair Loss  (ICD-704.00) 3)  Internal Hemorrhoids With Other Complication  (ICD-455.2) 4)  Chest Pain, Atypical  (ICD-786.59) 5)  Neoplasm, Malignant, Ovary, Family Hx  (ICD-V16.41) 6)  Family History of Colon Ca 1st Degree Relative <60  (ICD-V16.0) 7)  Degenerative Joint Disease, Mild  (ICD-715.90) 8)  Irritable Bowel Syndrome  (ICD-564.1) 9)  Hypertension  (ICD-401.9) 10)  Osteoporosis  (ICD-733.00) 11)  Diverticulosis, Colon  (ICD-562.10)  Medications Prior to Update: 1)  Oscal 500/200 D-3 500-200 Mg-Unit  Tabs (Calcium-Vitamin D) .... Once Daily 2)  Fish Oil Concentrate 1000 Mg Caps (Omega-3 Fatty Acids) .... Two By Mouth Bid 3)  Ziac 5-6.25 Mg  Tabs (Bisoprolol-Hydrochlorothiazide) .... Once Daily 4)  Mobic 7.5 Mg  Tabs (Meloxicam) .... As Needed 5)  Analpram-Hc 1-2.5 %  Crea (Hydrocortisone Ace-Pramoxine) .... Apply Q Hs For 7  Days 6)  Vitamin D [address] Unit  Caps (Ergocalciferol) .... One By Mouth Weekly 7)  B Complex  Tabs (B Complex Vitamins) .... One By Mouth Daily  Current Medications (verified): 1)  Oscal 500/200 D-3 500-200 Mg-Unit  Tabs (Calcium-Vitamin D) .... Once Daily 2)  Fish Oil Concentrate 1000 Mg Caps (Omega-3 Fatty Acids) .... Two By Mouth Bid 3)  Ziac 5-6.25 Mg  Tabs (Bisoprolol-Hydrochlorothiazide) .... Once Daily 4)  Mobic 7.5 Mg  Tabs (Meloxicam) .... As Needed 5)  Analpram-Hc 1-2.5 %  Crea (Hydrocortisone Ace-Pramoxine) .... Apply Q Hs For 7 Days 6)  Vitamin D 60454 Unit  Caps (Ergocalciferol) .... One By Mouth Weekly 7)  B Complex  Tabs (B Complex Vitamins) .... One By Mouth Daily 8)  Osteo Bi-Flex Regular Strength 250-200 Mg Tabs (Glucosamine-Chondroitin) .Marland Kitchen.. 1 Once Daily  Allergies (verified): No Known Drug Allergies  Past History:  Family History: Last updated: 09/04/2007 father Family History Lung cancer mother Family History of Colon CA 1st degree relative <60 Family History Ovarian cancer sister    Social History: Last updated: 05/30/2007 Retired Single Never Smoked  Risk Factors: Smoking Status: never (05/30/2007) Passive Smoke Exposure: no (12/04/2007)  Past medical, surgical, family and social histories (including risk factors) reviewed, and no changes noted (except as noted below).  Past Medical History: Reviewed history from 05/30/2007 and no changes required. Diverticulosis, colon Osteoporosis Hypertension IBS 564.1  Past Surgical History: Reviewed history from 10/02/2008  and no changes required. Lumbar laminectomy Lumbar fusion colon polyp colon 2006 Total hip replacement right  Family History: Reviewed history from 09/04/2007 and no changes required. father Family History Lung cancer mother Family History of Colon CA 1st degree relative <60 Family History Ovarian cancer sister    Social History: Reviewed history from 05/30/2007 and no  changes required. Retired Single Never Smoked  Review of Systems  The patient denies anorexia, fever, weight loss, weight gain, vision loss, decreased hearing, hoarseness, chest pain, syncope, dyspnea on exertion, peripheral edema, prolonged cough, headaches, hemoptysis, abdominal pain, melena, hematochezia, severe indigestion/heartburn, hematuria, incontinence, genital sores, muscle weakness, suspicious skin lesions, transient blindness, difficulty walking, depression, unusual weight change, abnormal bleeding, enlarged lymph nodes, angioedema, and breast masses.    Physical Exam  General:  Well-developed,well-nourished,in no acute distress; alert,appropriate and cooperative throughout examination Head:  Normocephalic and atraumatic without obvious abnormalities. No apparent alopecia or balding. Eyes:  pupils equal and pupils round.   Ears:  R ear normal and L ear normal.   Nose:  no external deformity and no nasal discharge.   Mouth:  pharynx pink and moist and no erythema.   Neck:  No deformities, masses, or tenderness noted. Lungs:  Normal respiratory effort, chest expands symmetrically. Lungs are clear to auscultation, no crackles or wheezes. Heart:  Normal rate and regular rhythm. S1 and S2 normal without gallop, murmur, click, rub or other extra sounds. Abdomen:  Bowel sounds positive,abdomen soft and non-tender without masses, organomegaly or hernias noted. Msk:  no joint tenderness, no joint swelling, no joint warmth, no joint deformities, and decreased ROM.   Psych:  Oriented X3 and not anxious appearing.     Impression & Recommendations:  Problem # 1:  HYPERLIPIDEMIA, MILD (ICD-272.4) continues to take the b vitamins Labs Reviewed: SGOT: 25 (10/01/2009)   SGPT: 15 (10/01/2009)  Prior 10 Yr Risk Heart Disease: Not enough information (09/04/2007)   HDL:53.40 (10/01/2009), 48.9 (10/02/2008)  LDL:112 (10/01/2009), DEL (10/02/2008)  Chol:187 (10/01/2009), 209 (10/02/2008)   Trig:109.0 (10/01/2009), 94 (10/02/2008)  Problem # 2:  HYPERTENSION (ICD-401.9)  Her updated medication list for this problem includes:    Ziac 5-6.25 Mg Tabs (Bisoprolol-hydrochlorothiazide) ..... Once daily  BP today: 136/72 Prior BP: 132/70 (04/02/2009)  Prior 10 Yr Risk Heart Disease: Not enough information (09/04/2007)  Labs Reviewed: K+: 3.9 (10/01/2009) Creat: : 0.8 (10/01/2009)   Chol: 187 (10/01/2009)   HDL: 53.40 (10/01/2009)   LDL: 112 (10/01/2009)   TG: 109.0 (10/01/2009)  Problem # 3:  HAIR LOSS (ICD-704.00) persistant but appears to be related more to age that pathology  Problem # 4:  OSTEOPOROSIS (ICD-733.00)  Discussed medication use, applications of heat or ice, and exercises.   Complete Medication List: 1)  Oscal 500/200 D-3 500-200 Mg-unit Tabs (Calcium-vitamin d) .... Once daily 2)  Fish Oil Concentrate 1000 Mg Caps (Omega-3 fatty acids) .... Two by mouth bid 3)  Ziac 5-6.25 Mg Tabs (Bisoprolol-hydrochlorothiazide) .... Once daily 4)  Mobic 7.5 Mg Tabs (Meloxicam) .... As needed 5)  Analpram-hc 1-2.5 % Crea (Hydrocortisone ace-pramoxine) .... Apply q hs for 7 days 6)  Vitamin D [address]40 Unit Caps (Ergocalciferol) .... One by mouth weekly 7)  B Complex Tabs (B complex vitamins) .... One by mouth daily 8)  Osteo Bi-flex Regular Strength 250-200 Mg Tabs (Glucosamine-chondroitin) .Marland Kitchen.. 1 once daily  Patient Instructions: 1)  Please schedule a follow-up appointment in 6 months.   Preventive Care Screening  Last Flu Shot:    Date:  10/08/2009  Results:  given  Colonoscopy:    Date:  06/27/2008    Next Due:  06/2015    Results:  normal   Mammogram:    Date:  12/25/2008    Results:  normal   Pap Smear:    Date:  09/27/2006    Results:  normal

## 2010-11-24 NOTE — Letter (Signed)
Summary: [facility] Instructions  [facility]  [address] [place], [place] [address]   Phone: [phone]  Fax: [phone]       REBECKAH MASIH    February 12, 1932    MRN: [id number]        Procedure Day /Date: Monday, 07/06/10     Arrival Time:  11:30      Procedure Time: 12:30X     Location of Procedure:                     Juliann Pares  [facility] ( Outpatient Registration)                        PREPARATION FOR COLONOSCOPY With Banding  WITH MOVIPREP   Starting 5 days prior to your procedure _ _ do not eat nuts, seeds, popcorn, corn, beans, peas,  salads, or any raw vegetables.  Do not take any fiber supplements (e.g. Metamucil, Citrucel, and Benefiber).  THE DAY BEFORE YOUR PROCEDURE         DATE: 06/1110    DAY: Sunday 1.  Drink clear liquids the entire day-NO SOLID FOOD  2.  Do not drink anything colored red or purple.  Avoid juices with pulp.  No orange juice.  3.  Drink at least 64 oz. (8 glasses) of fluid/clear liquids during the day to prevent dehydration and help the prep work efficiently.  CLEAR LIQUIDS INCLUDE: Water Jello Ice Popsicles Tea (sugar ok, no milk/cream) Powdered fruit flavored drinks Coffee (sugar ok, no milk/cream) Gatorade Juice: apple, white grape, white cranberry  Lemonade Clear bullion, consomm, broth Carbonated beverages (any kind) Strained chicken noodle soup Hard Candy                             4.  In the morning, mix first dose of MoviPrep solution:    Empty 1 Pouch A and 1 Pouch B into the disposable container    Add lukewarm drinking water to the top line of the container. Mix to dissolve    Refrigerate (mixed solution should be used within 24 hrs)  5.  Begin drinking the prep at 5:00 p.m. The MoviPrep container is divided by 4 marks.   Every 15 minutes drink the solution down to the next mark (approximately 8 oz) until the full liter is complete.   6.  Follow completed prep with 16 oz of clear  liquid of your choice (Nothing red or purple).  Continue to drink clear liquids until bedtime.  7.  Before going to bed, mix second dose of MoviPrep solution:    Empty 1 Pouch A and 1 Pouch B into the disposable container    Add lukewarm drinking water to the top line of the container. Mix to dissolve    Refrigerate  THE DAY OF YOUR PROCEDURE      DATE: 07/06/10      Day: Monday  Beginning at 7:30a.m. (5 hours before procedure):         1. Every 15 minutes, drink the solution down to the next mark (approx 8 oz) until the full liter is complete.  2. Follow completed prep with 16 oz. of clear liquid of your choice.    3. You may drink clear liquids until 10:30 am. ( 2 hours prior to procedure)   MEDICATION INSTRUCTIONS  Unless otherwise instructed, you should take regular prescription medications with a  small sip of water   as early as possible the morning of your procedure.              OTHER INSTRUCTIONS  You will need a responsible adult at least 75 years of age to accompany you and drive you home.   This person must remain in the waiting room during your procedure.  Wear loose fitting clothing that is easily removed.  Leave jewelry and other valuables at home.  However, you may wish to bring a book to read or  an iPod/MP3 player to listen to music as you wait for your procedure to start.  Remove all body piercing jewelry and leave at home.  Total time from sign-in until discharge is approximately 2-3 hours.  You should go home directly after your procedure and rest.  You can resume normal activities the  day after your procedure.  The day of your procedure you should not:   Drive   Make legal decisions   Operate machinery   Drink alcohol   Return to work  You will receive specific instructions about eating, activities and medications before you leave.    The above instructions have been reviewed and explained to me by    _______________________    I fully understand and can verbalize these instructions _____________________________ Date _________

## 2010-11-24 NOTE — Letter (Signed)
Summary: Gynecology-Dr. Beather Arbour  Gynecology-Dr. Beather Arbour   Imported By: Maryln Gottron 05/13/2010 12:41:38  _____________________________________________________________________  External Attachment:    Type:   Image     Comment:   External Document

## 2010-11-24 NOTE — Procedures (Signed)
Summary: Colonoscopy   Colonoscopy  Procedure date:  09/21/2005  Findings:      Results: Hemorrhoids.     Results: Diverticulosis.       Location:  [facility].    Patient Name: Tammy, Lucas MRN:  Procedure Procedures: Colonoscopy CPT: [id number].  Personnel: Endoscopist: Ulyess Mort, MD.  Exam Location: Exam performed in Outpatient Clinic. Outpatient  Patient Consent: Procedure, Alternatives, Risks and Benefits discussed, consent obtained, from patient. Consent was obtained by the RN.  Indications  Surveillance of: Adenomatous Polyp(s).  History  Current Medications: Patient is not currently taking Coumadin.  Pre-Exam Physical: Performed Jul 19, 2002. Entire physical exam was normal. Rectal exam, Abdominal exam, Extremity exam, Mental status exam WNL.  Exam Exam: Extent of exam reached: Cecum, extent intended: Cecum.  The cecum was identified by appendiceal orifice and IC valve. Colon retroflexion performed. Images taken. ASA Classification: II. Tolerance: excellent.  Monitoring: Pulse and BP monitoring, Oximetry used. Supplemental O2 given.  Colon Prep Prep results: good.  Sedation Meds: Patient assessed and found to be appropriate for moderate (conscious) sedation. Fentanyl given IV. Versed given IV.  Findings - DIVERTICULOSIS: Descending Colon to Sigmoid Colon. ICD9: Diverticulosis: 562.10. Comments: mild ---moderate with very tortuous sigmoid colon.  - HEMORRHOIDS: Internal and External. Size: Grade II. ICD9: Hemorrhoids, Internal and  External: 455.6.   Assessment Abnormal examination, see findings above.  Diagnoses: 562.10: Diverticulosis.  455.6: Hemorrhoids, Internal and  External.   Events  Unplanned Interventions: No intervention was required.  Unplanned Events: There were no complications. Plans Medication Plan: Continue current medications.  Patient Education: Patient given standard instructions for:  Diverticulosis. Hemorrhoids. Yearly hemoccult testing recommended. Patient instructed to get routine colonoscopy every 7 years.  Disposition: After procedure patient sent to recovery. After recovery patient sent home.    cc: Stacie Glaze, MD   This report was created from the original endoscopy report, which was reviewed and signed by the above listed endoscopist.

## 2010-11-24 NOTE — Assessment & Plan Note (Signed)
Summary: FOLLOW-UP FROM COLON/BANDING W/DR.KAPLAN           Tammy   History of Present Illness Visit Type: Follow-up Visit Primary GI MD: Sheryn Bison MD FACP FAGA Primary Provider: Stacie Glaze MD Chief Complaint: Colon banding F/U History of Present Illness:   [name] has returned following colonoscopy and band ligation of her internal hemorrhoids.  Severe diverticulosis only was seen by colonoscopy.  She underwent band ligation of her hemorrhoids and reports significant improvement in her hemorrhoidal symptoms.  Bleeding has entirely subsided.  She has some mild irritation from external skin tags for which she uses a topical hemorrhoidal cream.  Altogether she is feeling well.   GI Review of Systems      Denies abdominal pain, acid reflux, belching, bloating, chest pain, dysphagia with liquids, dysphagia with solids, heartburn, loss of appetite, nausea, vomiting, vomiting blood, weight loss, and  weight gain.        Denies anal fissure, black tarry stools, change in bowel habit, constipation, diarrhea, diverticulosis, fecal incontinence, heme positive stool, hemorrhoids, irritable bowel syndrome, jaundice, light color stool, liver problems, rectal bleeding, and  rectal pain.    Current Medications (verified): 1)  Citracal Plus  Tabs (Multiple Minerals-Vitamins) .... Once Daily 2)  Fish Oil Concentrate 1000 Mg Caps (Omega-3 Fatty Acids) .... Two By Mouth Bid 3)  Bisoprolol-Hydrochlorothiazide 5-6.25 Mg Tabs (Bisoprolol-Hydrochlorothiazide) .... One By Mouth Daily 4)  Mobic 15 Mg Tabs (Meloxicam) .Marland Kitchen.. 1 Once Daily As Needed 5)  Analpram-Hc 1-2.5 %  Crea (Hydrocortisone Ace-Pramoxine) .... Apply Q Hs For 7 Days 6)  Vitamin D 72536 Unit  Caps (Ergocalciferol) .... One By Mouth Weekly 7)  B Complex  Tabs (B Complex Vitamins) .... One By Mouth Daily 8)  Osteo Bi-Flex Regular Strength 250-200 Mg Tabs (Glucosamine-Chondroitin) .Marland Kitchen.. 1 Once Daily 9)  Multivitamins  Tabs  (Multiple Vitamin) .... Once Daily  Allergies (verified): 1)  ! Codeine  Past History:  Past Medical History: Reviewed history from 06/16/2010 and no changes required. Diverticulosis, colon Osteoporosis Hypertension IBS 564.1 Urinary Tract Infection  Past Surgical History: Reviewed history from 06/16/2010 and no changes required. Lumbar laminectomy Lumbar fusion partial resection of colon for "tumor" colon polyp colon 2006 Total hip replacement right Appendectomy  Family History: Reviewed history from 06/16/2010 and no changes required. father Family History Lung cancer mother Family History of Colon CA 1st degree relative <60 Family History Ovarian cancer sister   Family History of Liver Cancer:Brother Family History of Breast Cancer:Sister  Social History: Reviewed history from 06/16/2010 and no changes required. Retired Single Never Smoked Alcohol Use - no Daily Caffeine Use 3 Illicit Drug Use - no  Review of Systems       The patient complains of arthritis/joint pain, fatigue, muscle pains/cramps, sleeping problems, and urination - excessive.  The patient denies allergy/sinus, anemia, anxiety-new, back pain, blood in urine, breast changes/lumps, change in vision, confusion, cough, coughing up blood, depression-new, fainting, fever, headaches-new, hearing problems, heart murmur, heart rhythm changes, itching, menstrual pain, night sweats, nosebleeds, pregnancy symptoms, shortness of breath, skin rash, sore throat, swelling of feet/legs, swollen lymph glands, thirst - excessive , urination - excessive , urination changes/pain, urine leakage, vision changes, and voice change.    Vital Signs:  Patient profile:   77 year old female Height:      64 inches Weight:      118.38 pounds BMI:     20.39 Pulse rate:   64 / minute Pulse rhythm:  regular BP sitting:   120 / 62  (left arm) Cuff size:   regular  Vitals Entered By: June McMurray CMA Duncan Dull) (July 29, 2010 9:16 AM)   Impression & Recommendations:  Problem # 1:  FAMILY HISTORY OF COLON CA 1ST DEGREE RELATIVE <60 (ICD-V16.0) Plan continued screening colonoscopy every 5 years with Dr. Jarold Motto  Problem # 2:  RECTAL BLEEDING (ICD-569.3) Assessment: Improved  Problem # 3:  INTERNAL HEMORRHOIDS WITH OTHER COMPLICATION (ICD-455.2) Plan repeat and ligation  Patient Instructions: 1)  Copy sent to : Stacie Glaze MD

## 2010-11-24 NOTE — Assessment & Plan Note (Signed)
Summary: 3 month rov/njr   Vital Signs:  Patient profile:   77 year old female Height:      64 inches Weight:      118 pounds BMI:     20.33 Temp:     98.2 degrees F oral Pulse rate:   72 / minute Resp:     14 per minute BP sitting:   130 / 80  (left arm)  Vitals Entered By: Willy Eddy, LPN (September 11, 2010 10:23 AM) CC: roa, Hypertension Management Is Patient Diabetic? No   Primary Care Provider:  Stacie Glaze MD  CC:  roa and Hypertension Management.  History of Present Illness: the results of the colonscopy and the banding of the hemmorhids has stopped the bleeding and the pt feels nbetter. We discussed the finding of the colon and the fact that she has severe diverticulosis in the left colon puts her at increased risk for infecton and bleeding The emphasis on calcium, vitamin d and weight bearing exerciz for the osteoporosis  Hypertension History:      She denies headache, chest pain, palpitations, dyspnea with exertion, orthopnea, PND, peripheral edema, visual symptoms, neurologic problems, syncope, and side effects from treatment.        Positive major cardiovascular risk factors include female age 105 years old or older, hyperlipidemia, and hypertension.  Negative major cardiovascular risk factors include negative family history for ischemic heart disease and non-tobacco-user status.     Preventive Screening-Counseling & Management  Alcohol-Tobacco     Smoking Status: never     Passive Smoke Exposure: no     Tobacco Counseling: not indicated; no tobacco use  Problems Prior to Update: 1)  Rectal Bleeding  (ICD-569.3) 2)  Anemia  (ICD-285.9) 3)  Colonic Polyps, Adenomatous, Benign  (ICD-569.0) 4)  Urethral Caruncle  (ICD-599.3) 5)  Hyperlipidemia, Mild  (ICD-272.4) 6)  Hair Loss  (ICD-704.00) 7)  Internal Hemorrhoids With Other Complication  (ICD-455.2) 8)  Chest Pain, Atypical  (ICD-786.59) 9)  Neoplasm, Malignant, Ovary, Family Hx  (ICD-V16.41) 10)   Family History of Colon Ca 1st Degree Relative <60  (ICD-V16.0) 11)  Degenerative Joint Disease, Mild  (ICD-715.90) 12)  Irritable Bowel Syndrome  (ICD-564.1) 13)  Hypertension  (ICD-401.9) 14)  Osteoporosis  (ICD-733.00) 15)  Diverticulosis, Colon  (ICD-562.10)  Current Problems (verified): 1)  Rectal Bleeding  (ICD-569.3) 2)  Anemia  (ICD-285.9) 3)  Colonic Polyps, Adenomatous, Benign  (ICD-569.0) 4)  Urethral Caruncle  (ICD-599.3) 5)  Hyperlipidemia, Mild  (ICD-272.4) 6)  Hair Loss  (ICD-704.00) 7)  Internal Hemorrhoids With Other Complication  (ICD-455.2) 8)  Chest Pain, Atypical  (ICD-786.59) 9)  Neoplasm, Malignant, Ovary, Family Hx  (ICD-V16.41) 10)  Family History of Colon Ca 1st Degree Relative <60  (ICD-V16.0) 11)  Degenerative Joint Disease, Mild  (ICD-715.90) 12)  Irritable Bowel Syndrome  (ICD-564.1) 13)  Hypertension  (ICD-401.9) 14)  Osteoporosis  (ICD-733.00) 15)  Diverticulosis, Colon  (ICD-562.10)  Medications Prior to Update: 1)  Citracal Plus  Tabs (Multiple Minerals-Vitamins) .... Once Daily 2)  Fish Oil Concentrate 1000 Mg Caps (Omega-3 Fatty Acids) .... Two By Mouth Bid 3)  Bisoprolol-Hydrochlorothiazide 5-6.25 Mg Tabs (Bisoprolol-Hydrochlorothiazide) .... One By Mouth Daily 4)  Mobic 15 Mg Tabs (Meloxicam) .Marland Kitchen.. 1 Once Daily As Needed 5)  Analpram-Hc 1-2.5 %  Crea (Hydrocortisone Ace-Pramoxine) .... Apply Q Hs For 7 Days 6)  Vitamin D 60454 Unit  Caps (Ergocalciferol) .... One By Mouth Weekly 7)  B Complex  Tabs (B  Complex Vitamins) .... One By Mouth Daily 8)  Osteo Bi-Flex Regular Strength 250-200 Mg Tabs (Glucosamine-Chondroitin) .Marland Kitchen.. 1 Once Daily 9)  Multivitamins  Tabs (Multiple Vitamin) .... Once Daily  Current Medications (verified): 1)  Citracal Plus  Tabs (Multiple Minerals-Vitamins) .... Once Daily 2)  Fish Oil Concentrate 1000 Mg Caps (Omega-3 Fatty Acids) .... Two By Mouth Bid 3)  Bisoprolol-Hydrochlorothiazide 5-6.25 Mg Tabs  (Bisoprolol-Hydrochlorothiazide) .... One By Mouth Daily 4)  Mobic 15 Mg Tabs (Meloxicam) .Marland Kitchen.. 1 Once Daily As Needed 5)  Analpram-Hc 1-2.5 %  Crea (Hydrocortisone Ace-Pramoxine) .... Apply Q Hs For 7 Days 6)  Vitamin D 60454 Unit  Caps (Ergocalciferol) .... One By Mouth Weekly 7)  B Complex  Tabs (B Complex Vitamins) .... One By Mouth Daily 8)  Osteo Bi-Flex Regular Strength 250-200 Mg Tabs (Glucosamine-Chondroitin) .Marland Kitchen.. 1 Once Daily 9)  Multivitamins  Tabs (Multiple Vitamin) .... Once Daily  Allergies (verified): 1)  ! Codeine  Past History:  Family History: Last updated: 06/16/2010 father Family History Lung cancer mother Family History of Colon CA 1st degree relative <60 Family History Ovarian cancer sister   Family History of Liver Cancer:Brother Family History of Breast Cancer:Sister  Social History: Last updated: 06/16/2010 Retired Single Never Smoked Alcohol Use - no Daily Caffeine Use 3 Illicit Drug Use - no  Risk Factors: Smoking Status: never (09/11/2010) Passive Smoke Exposure: no (09/11/2010)  Past medical, surgical, family and social histories (including risk factors) reviewed, and no changes noted (except as noted below).  Past Medical History: Reviewed history from 06/16/2010 and no changes required. Diverticulosis, colon Osteoporosis Hypertension IBS 564.1 Urinary Tract Infection  Past Surgical History: Reviewed history from 06/16/2010 and no changes required. Lumbar laminectomy Lumbar fusion partial resection of colon for "tumor" colon polyp colon 2006 Total hip replacement right Appendectomy  Family History: Reviewed history from 06/16/2010 and no changes required. father Family History Lung cancer mother Family History of Colon CA 1st degree relative <60 Family History Ovarian cancer sister   Family History of Liver Cancer:Brother Family History of Breast Cancer:Sister  Social History: Reviewed history from 06/16/2010 and no  changes required. Retired Single Never Smoked Alcohol Use - no Daily Caffeine Use 3 Illicit Drug Use - no  Review of Systems  The patient denies anorexia, fever, weight loss, weight gain, vision loss, decreased hearing, hoarseness, chest pain, syncope, dyspnea on exertion, peripheral edema, prolonged cough, headaches, hemoptysis, abdominal pain, melena, hematochezia, severe indigestion/heartburn, hematuria, incontinence, genital sores, muscle weakness, suspicious skin lesions, transient blindness, difficulty walking, depression, unusual weight change, abnormal bleeding, enlarged lymph nodes, angioedema, and breast masses.         Flu Vaccine Consent Questions     Do you have a history of severe allergic reactions to this vaccine? no    Any prior history of allergic reactions to egg and/or gelatin? no    Do you have a sensitivity to the preservative Thimersol? no    Do you have a past history of Guillan-Barre Syndrome? no    Do you currently have an acute febrile illness? no    Have you ever had a severe reaction to latex? no    Vaccine information given and explained to patient? yes    Are you currently pregnant? no    Lot Number:[id number]   Exp Date:04/24/2011   Site Given  Left Deltoid IM   Physical Exam  General:  Well-developed,well-nourished,in no acute distress; alert,appropriate and cooperative throughout examination Head:  Normocephalic and atraumatic without obvious abnormalities.  No apparent alopecia or balding. Eyes:  pupils equal and pupils round.   Ears:  R ear normal and L ear normal.   Mouth:  pharynx pink and moist and no erythema.   Neck:  No deformities, masses, or tenderness noted. Lungs:  Normal respiratory effort, chest expands symmetrically. Lungs are clear to auscultation, no crackles or wheezes. Heart:  Normal rate and regular rhythm. S1 and S2 normal without gallop, murmur, click, rub or other extra sounds. Abdomen:  Bowel sounds positive,abdomen soft and  non-tender without masses, organomegaly or hernias noted. Msk:  no joint tenderness, no joint swelling, no joint warmth, no joint deformities, and decreased ROM.   Extremities:  No clubbing, cyanosis, edema, or deformity noted with normal full range of motion of all joints.   Neurologic:  alert & oriented X3 and gait normal.     Impression & Recommendations:  Problem # 1:  OSTEOPOROSIS (ICD-733.00) screenign  in 2011 reemphasis on calcium and vitamin D Discussed medication use, applications of heat or ice, and exercises.   Problem # 2:  HYPERLIPIDEMIA, MILD (ICD-272.4)  Labs Reviewed: SGOT: 25 (10/01/2009)   SGPT: 15 (10/01/2009)  10 Yr Risk Heart Disease: 9 % Prior 10 Yr Risk Heart Disease: Not enough information (09/04/2007)   HDL:53.40 (10/01/2009), 48.9 (10/02/2008)  LDL:112 (10/01/2009), DEL (10/02/2008)  Chol:187 (10/01/2009), 209 (10/02/2008)  Trig:109.0 (10/01/2009), 94 (10/02/2008)  Problem # 3:  DEGENERATIVE JOINT DISEASE, MILD (ICD-715.90) primarily in the hip and se used te mobic very intermintantly Her updated medication list for this problem includes:    Mobic 15 Mg Tabs (Meloxicam) .Marland Kitchen... 1 once daily as needed  Problem # 4:  HYPERTENSION (ICD-401.9)  Her updated medication list for this problem includes:    Bisoprolol-hydrochlorothiazide 5-6.25 Mg Tabs (Bisoprolol-hydrochlorothiazide) ..... One by mouth daily  BP today: 130/80 Prior BP: 120/62 (07/29/2010)  10 Yr Risk Heart Disease: 9 % Prior 10 Yr Risk Heart Disease: Not enough information (09/04/2007)  Labs Reviewed: K+: 3.9 (10/01/2009) Creat: : 0.8 (10/01/2009)   Chol: 187 (10/01/2009)   HDL: 53.40 (10/01/2009)   LDL: 112 (10/01/2009)   TG: 109.0 (10/01/2009)  Orders: Prescription Created Electronically [phone])  Complete Medication List: 1)  Citracal Plus Tabs (Multiple minerals-vitamins) .... Once daily 2)  Fish Oil Concentrate 1000 Mg Caps (Omega-3 fatty acids) .... Two by mouth bid 3)   Bisoprolol-hydrochlorothiazide 5-6.25 Mg Tabs (Bisoprolol-hydrochlorothiazide) .... One by mouth daily 4)  Mobic 15 Mg Tabs (Meloxicam) .Marland Kitchen.. 1 once daily as needed 5)  Analpram-hc 1-2.5 % Crea (Hydrocortisone ace-pramoxine) .... Apply q hs for 7 days 6)  Vitamin D 32440 Unit Caps (Ergocalciferol) .... One by mouth weekly 7)  B Complex Tabs (B complex vitamins) .... One by mouth daily 8)  Osteo Bi-flex Regular Strength 250-200 Mg Tabs (Glucosamine-chondroitin) .Marland Kitchen.. 1 once daily 9)  Multivitamins Tabs (Multiple vitamin) .... Once daily  Other Orders: Flu Vaccine 42yrs + MEDICARE PATIENTS (N0272) Administration Flu vaccine - MCR (Z3664)  Hypertension Assessment/Plan:      The patient's hypertensive risk group is category B: At least one risk factor (excluding diabetes) with no target organ damage.  Her calculated 10 year risk of coronary heart disease is 9 %.  Today's blood pressure is 130/80.  Her blood pressure goal is < 140/90.  Patient Instructions: 1)  Nioxin shampoo... for thining hair 2)  Please schedule a follow-up appointment in 6 months. medicare welness visit come fasting   Orders Added: 1)  Flu Vaccine 38yrs + MEDICARE PATIENTS [Q2039]  2)  Administration Flu vaccine - MCR [G0008] 3)  Prescription Created Electronically [[id number]] 4)  Est. Patient Level IV [16109]

## 2010-11-24 NOTE — Assessment & Plan Note (Signed)
Summary: M3A//SAH   Vital Signs:  Patient Profile:   77 Years Old Female Height:     64 inches Weight:      116 pounds Temp:     98.5 degrees F oral Pulse rate:   60 / minute BP sitting:   136 / 80  (left arm)  Vitals Entered By: Willy Eddy, LPN (September 04, 2007 10:03 AM)                 Chief Complaint:  roa f/u labs/.  History of Present Illness: Sister with ovarian cancer and pt has fears. Hx of abdominal pain Right sided unusual location for her chronic abdominal pain. Lasting 8 hours dull ache with nausia. Has weight loss CT of abd and pelvis 05/2006 was negative.    Hypertension History:      She denies headache, chest pain, palpitations, dyspnea with exertion, orthopnea, PND, peripheral edema, visual symptoms, neurologic problems, syncope, and side effects from treatment.  She notes no problems with any antihypertensive medication side effects.  Further comments include: blood pressure in control.        Positive major cardiovascular risk factors include female age 40 years old or older and hypertension.  Negative major cardiovascular risk factors include non-tobacco-user status.       Past Medical History:    Reviewed history from 05/30/2007 and no changes required:       Diverticulosis, colon       Osteoporosis       Hypertension       IBS 564.1  Past Surgical History:    Reviewed history from 05/30/2007 and no changes required:       Lumbar laminectomy       Lumbar fusion       colon polyp       colon 2006   Family History:    Reviewed history from 05/30/2007 and no changes required:       father       Family History Lung cancer       mother       Family History of Colon CA 1st degree relative <60       Family History Ovarian cancer sister          Social History:    Reviewed history from 05/30/2007 and no changes required:       Retired       Single       Never Smoked    Review of Systems  The patient denies weight loss.      Physical Exam  General:     underweight appearing and cachetic.   Head:     normocephalic.   Ears:     R ear normal and L ear normal.   Mouth:     pharynx pink and moist.   Neck:     No deformities, masses, or tenderness noted. Lungs:     Normal respiratory effort, chest expands symmetrically. Lungs are clear to auscultation, no crackles or wheezes. Heart:     normal rate, regular rhythm, and no murmur.   Abdomen:     soft and non-tender.      Impression & Recommendations:  Problem # 1:  Preventive Health Care (ICD-V70.0) lot [id number].Exp 04/23/2008 [employer] pasteur-left deltoid IM.0.69ml   Problem # 2:  NEOPLASM, MALIGNANT, OVARY, FAMILY HX (ICD-V16.41) sister with presentation of abdominal pain and ovarian cancer CT neg 2007,  will order CA 126 and consider [place] Orders: T-CA 125 (  [phone]) TLB-BMP (Basic Metabolic Panel-BMET) (80048-METABOL)   Problem # 3:  HYPERTENSION (ICD-401.9)  Her updated medication list for this problem includes:    Ziac 5-6.25 Mg Tabs (Bisoprolol-hydrochlorothiazide) ..... Once daily  Orders: TLB-BMP (Basic Metabolic Panel-BMET) (80048-METABOL)  BP today: 136/80 Prior BP: 140/74 (05/30/2007)  10 Yr Risk Heart Disease: Not enough information  Labs Reviewed: Creat: 0.8 (05/30/2007)   Problem # 4:  DIVERTICULOSIS, COLON (ICD-562.10) probable cause of the recurent abdominal pain fiber and fish oil  Complete Medication List: 1)  Oscal 500/200 D-3 500-200 Mg-unit Tabs (Calcium-vitamin d) .... Once daily 2)  Fish Oil Oil (Fish oil) .... 4 po daily 3)  Ziac 5-6.25 Mg Tabs (Bisoprolol-hydrochlorothiazide) .... Once daily 4)  Mobic 7.5 Mg Tabs (Meloxicam) .... As needed  Other Orders: Influenza Vaccine MCR (13086)  Hypertension Assessment/Plan:      The patient's hypertensive risk group is category B: At least one risk factor (excluding diabetes) with no target organ damage.  Today's blood pressure is 136/80.  Her blood pressure goal  is < 140/90.   Patient Instructions: 1)  ask sister if her CA 125 was elevated with her cancer. 2)  If it was and yours is normal that will be reassuring in light of a negative CT in 2007. 3)  Diverticulosis is the probable cause. 4)  If her CA125 was not elevated then we would proceed with an ovarian transvaginal US. 5)  May take 4 capsule of fish oil daily 6)  stop the osteobioflex 7)  Please schedule a follow-up appointment in 3 months.    Prescriptions: ZIAC 5-6.25 MG  TABS (BISOPROLOL-HYDROCHLOROTHIAZIDE) once daily  #30 x 11   Entered by:   Willy Eddy, LPN   Authorized by:   Stacie Glaze MD   Signed by:   Willy Eddy, LPN on 57/84/6962   Method used:   Print then Give to Patient   RxID:   [id number]  ]  Influenza Vaccine    Vaccine Type: Fluvax MCR    Given by: Willy Eddy, LPN  Flu Vaccine Consent Questions    Do you have a history of severe allergic reactions to this vaccine? no    Any prior history of allergic reactions to egg and/or gelatin? no    Do you have a sensitivity to the preservative Thimersol? no    Do you have a past history of Guillan-Barre Syndrome? no    Do you currently have an acute febrile illness? no    Have you ever had a severe reaction to latex? no    Vaccine information given and explained to patient? yes    Are you currently pregnant? no   Appended Document: Orders Update    Clinical Lists Changes  Orders: Added new Service order of Venipuncture [phone]) - Signed

## 2010-11-26 NOTE — Assessment & Plan Note (Signed)
Summary: SINUSITIS ? // RS   Vital Signs:  Patient profile:   77 year old female Height:      64 inches Weight:      118 pounds BMI:     20.33 Temp:     98.9 degrees F oral Pulse rate:   72 / minute Resp:     14 per minute BP sitting:   130 / 80  (left arm)  Vitals Entered By: Willy Eddy, LPN (September 28, 2010 4:16 PM) CC: c/o sore throat, laryngitis, and post nasa drainage, URI symptoms Is Patient Diabetic? No   Primary Care Provider:  Stacie Glaze MD  CC:  c/o sore throat, laryngitis, and post nasa drainage, and URI symptoms.  History of Present Illness: has been sick for 7 days, worse at night hoarse, with cough that is worse at night ST of and on PNdrip  URI Symptoms      This is a 77 year old woman who presents with URI symptoms.  The patient reports nasal congestion, sore throat, and dry cough.  Associated symptoms include low-grade fever (<100.5 degrees).  The patient denies itchy watery eyes, itchy throat, sneezing, seasonal symptoms, response to antihistamine, headache, muscle aches, and severe fatigue.    Preventive Screening-Counseling & Management  Alcohol-Tobacco     Smoking Status: never     Passive Smoke Exposure: no     Tobacco Counseling: not indicated; no tobacco use  Problems Prior to Update: 1)  Acute Frontal Sinusitis  (ICD-461.1) 2)  Rectal Bleeding  (ICD-569.3) 3)  Anemia  (ICD-285.9) 4)  Colonic Polyps, Adenomatous, Benign  (ICD-569.0) 5)  Urethral Caruncle  (ICD-599.3) 6)  Hyperlipidemia, Mild  (ICD-272.4) 7)  Hair Loss  (ICD-704.00) 8)  Internal Hemorrhoids With Other Complication  (ICD-455.2) 9)  Chest Pain, Atypical  (ICD-786.59) 10)  Neoplasm, Malignant, Ovary, Family Hx  (ICD-V16.41) 11)  Family History of Colon Ca 1st Degree Relative <60  (ICD-V16.0) 12)  Degenerative Joint Disease, Mild  (ICD-715.90) 13)  Irritable Bowel Syndrome  (ICD-564.1) 14)  Hypertension  (ICD-401.9) 15)  Osteoporosis  (ICD-733.00) 16)   Diverticulosis, Colon  (ICD-562.10)  Current Problems (verified): 1)  Rectal Bleeding  (ICD-569.3) 2)  Anemia  (ICD-285.9) 3)  Colonic Polyps, Adenomatous, Benign  (ICD-569.0) 4)  Urethral Caruncle  (ICD-599.3) 5)  Hyperlipidemia, Mild  (ICD-272.4) 6)  Hair Loss  (ICD-704.00) 7)  Internal Hemorrhoids With Other Complication  (ICD-455.2) 8)  Chest Pain, Atypical  (ICD-786.59) 9)  Neoplasm, Malignant, Ovary, Family Hx  (ICD-V16.41) 10)  Family History of Colon Ca 1st Degree Relative <60  (ICD-V16.0) 11)  Degenerative Joint Disease, Mild  (ICD-715.90) 12)  Irritable Bowel Syndrome  (ICD-564.1) 13)  Hypertension  (ICD-401.9) 14)  Osteoporosis  (ICD-733.00) 15)  Diverticulosis, Colon  (ICD-562.10)  Medications Prior to Update: 1)  Citracal Plus  Tabs (Multiple Minerals-Vitamins) .... Once Daily 2)  Fish Oil Concentrate 1000 Mg Caps (Omega-3 Fatty Acids) .... Two By Mouth Bid 3)  Bisoprolol-Hydrochlorothiazide 5-6.25 Mg Tabs (Bisoprolol-Hydrochlorothiazide) .... One By Mouth Daily 4)  Mobic 15 Mg Tabs (Meloxicam) .Marland Kitchen.. 1 Once Daily As Needed 5)  Analpram-Hc 1-2.5 %  Crea (Hydrocortisone Ace-Pramoxine) .... Apply Q Hs For 7 Days 6)  Vitamin D 41324 Unit  Caps (Ergocalciferol) .... One By Mouth Weekly 7)  B Complex  Tabs (B Complex Vitamins) .... One By Mouth Daily 8)  Osteo Bi-Flex Regular Strength 250-200 Mg Tabs (Glucosamine-Chondroitin) .Marland Kitchen.. 1 Once Daily 9)  Multivitamins  Tabs (Multiple Vitamin) .Marland KitchenMarland KitchenMarland Kitchen  Once Daily  Current Medications (verified): 1)  Citracal Plus  Tabs (Multiple Minerals-Vitamins) .... Once Daily 2)  Fish Oil Concentrate 1000 Mg Caps (Omega-3 Fatty Acids) .... Two By Mouth Bid 3)  Bisoprolol-Hydrochlorothiazide 5-6.25 Mg Tabs (Bisoprolol-Hydrochlorothiazide) .... One By Mouth Daily 4)  Mobic 15 Mg Tabs (Meloxicam) .Marland Kitchen.. 1 Once Daily As Needed 5)  Analpram-Hc 1-2.5 %  Crea (Hydrocortisone Ace-Pramoxine) .... Apply Q Hs For 7 Days 6)  Vitamin D 04540 Unit  Caps  (Ergocalciferol) .... One By Mouth Weekly 7)  B Complex  Tabs (B Complex Vitamins) .... One By Mouth Daily 8)  Osteo Bi-Flex Regular Strength 250-200 Mg Tabs (Glucosamine-Chondroitin) .Marland Kitchen.. 1 Once Daily 9)  Multivitamins  Tabs (Multiple Vitamin) .... Once Daily  Allergies (verified): 1)  ! Codeine  Past History:  Family History: Last updated: 06/16/2010 father Family History Lung cancer mother Family History of Colon CA 1st degree relative <60 Family History Ovarian cancer sister   Family History of Liver Cancer:Brother Family History of Breast Cancer:Sister  Social History: Last updated: 06/16/2010 Retired Single Never Smoked Alcohol Use - no Daily Caffeine Use 3 Illicit Drug Use - no  Risk Factors: Smoking Status: never (09/28/2010) Passive Smoke Exposure: no (09/28/2010)  Past medical, surgical, family and social histories (including risk factors) reviewed, and no changes noted (except as noted below).  Past Medical History: Reviewed history from 06/16/2010 and no changes required. Diverticulosis, colon Osteoporosis Hypertension IBS 564.1 Urinary Tract Infection  Past Surgical History: Reviewed history from 06/16/2010 and no changes required. Lumbar laminectomy Lumbar fusion partial resection of colon for "tumor" colon polyp colon 2006 Total hip replacement right Appendectomy  Family History: Reviewed history from 06/16/2010 and no changes required. father Family History Lung cancer mother Family History of Colon CA 1st degree relative <60 Family History Ovarian cancer sister   Family History of Liver Cancer:Brother Family History of Breast Cancer:Sister  Social History: Reviewed history from 06/16/2010 and no changes required. Retired Single Never Smoked Alcohol Use - no Daily Caffeine Use 3 Illicit Drug Use - no  Review of Systems       The patient complains of hoarseness, dyspnea on exertion, and prolonged cough.  The patient denies  anorexia, fever, weight loss, weight gain, vision loss, decreased hearing, chest pain, syncope, peripheral edema, headaches, hemoptysis, abdominal pain, melena, hematochezia, severe indigestion/heartburn, hematuria, incontinence, genital sores, muscle weakness, suspicious skin lesions, transient blindness, difficulty walking, depression, unusual weight change, abnormal bleeding, enlarged lymph nodes, angioedema, and breast masses.    Physical Exam  General:  Well-developed,well-nourished,in no acute distress; alert,appropriate and cooperative throughout examination Head:  Normocephalic and atraumatic without obvious abnormalities. No apparent alopecia or balding. Eyes:  pupils equal and pupils round.   Ears:  R ear normal and L ear normal.   Nose:  mucosal erythema and mucosal edema.   Mouth:  tonsil hypertropied, posterior lymphoid hypertrophy, and postnasal drip.   Neck:  No deformities, masses, or tenderness noted. Lungs:  normal respiratory effort, R wheezes, and R egophony.   Heart:  normal rate and regular rhythm.     Impression & Recommendations:  Problem # 1:  ACUTE FRONTAL SINUSITIS (ICD-461.1) Assessment Deteriorated  this apears to be URI but certainly given age and slight finding she is at risk for pnemonia if calls back chest xray vocer with broad spectrum abx Instructed on treatment. Call if symptoms persist or worsen.   Her updated medication list for this problem includes:    Levaquin 500 Mg Tabs (Levofloxacin) .Marland KitchenMarland KitchenMarland KitchenMarland Kitchen  1 once daily for 10 days  Complete Medication List: 1)  Citracal Plus Tabs (Multiple minerals-vitamins) .... Once daily 2)  Fish Oil Concentrate 1000 Mg Caps (Omega-3 fatty acids) .... Two by mouth bid 3)  Bisoprolol-hydrochlorothiazide 5-6.25 Mg Tabs (Bisoprolol-hydrochlorothiazide) .... One by mouth daily 4)  Mobic 15 Mg Tabs (Meloxicam) .Marland Kitchen.. 1 once daily as needed 5)  Analpram-hc 1-2.5 % Crea (Hydrocortisone ace-pramoxine) .... Apply q hs for 7  days 6)  Vitamin D 14782 Unit Caps (Ergocalciferol) .... One by mouth weekly 7)  B Complex Tabs (B complex vitamins) .... One by mouth daily 8)  Osteo Bi-flex Regular Strength 250-200 Mg Tabs (Glucosamine-chondroitin) .Marland Kitchen.. 1 once daily 9)  Multivitamins Tabs (Multiple vitamin) .... Once daily 10)  Levaquin 500 Mg Tabs (Levofloxacin) .Marland Kitchen.. 1 once daily for 10 days  Patient Instructions: 1)  Take your antibiotic as prescribed until ALL of it is gone, but stop if you develop a rash or swelling and contact our office as soon as possible. Prescriptions: AMOXICILLIN 500 MG CAP (AMOXICILLIN) Take 1 capsule by mouth three times a day X 10 days  #30 x 0   Entered and Authorized by:   Stacie Glaze MD   Signed by:   Stacie Glaze MD on 09/28/2010   Method used:   Electronically to        [facility].* (retail)       [address]       [place], [place]  [address]       Ph: [phone]       Fax: [phone]   RxID:   [id number]   Appended Document: SINUSITIS ? // RS     Allergies: 1)  ! Codeine   Complete Medication List: 1)  Citracal Plus Tabs (Multiple minerals-vitamins) .... Once daily 2)  Fish Oil Concentrate 1000 Mg Caps (Omega-3 fatty acids) .... Two by mouth bid 3)  Bisoprolol-hydrochlorothiazide 5-6.25 Mg Tabs (Bisoprolol-hydrochlorothiazide) .... One by mouth daily 4)  Mobic 15 Mg Tabs (Meloxicam) .Marland Kitchen.. 1 once daily as needed 5)  Analpram-hc 1-2.5 % Crea (Hydrocortisone ace-pramoxine) .... Apply q hs for 7 days 6)  Vitamin D 47425 Unit Caps (Ergocalciferol) .... One by mouth weekly 7)  B Complex Tabs (B complex vitamins) .... One by mouth daily 8)  Osteo Bi-flex Regular Strength 250-200 Mg Tabs (Glucosamine-chondroitin) .Marland Kitchen.. 1 once daily 9)  Multivitamins Tabs (Multiple vitamin) .... Once daily 10)  Levaquin 500 Mg Tabs (Levofloxacin) .Marland Kitchen.. 1 once daily for 10 days   Orders Added: 1)  Est. Patient Level III [[address]]

## 2010-11-26 NOTE — Progress Notes (Signed)
Summary: please no better  Phone Note Call from Patient Call back at Home Phone [phone]   Caller: Patient Call For: Stacie Glaze MD Reason for Call: Acute Illness Summary of Call: Pt was seen on 09/28/2010 she is no better still having chest congestion and hoarseness. Pt will be avail this afternoon Initial call taken by: Heron Sabins,  October 06, 2010 10:24 AM  Follow-up for Phone Call        PER DR Lovell Sheehan LEVAQUIN 500 1 once daily FOR 10 DAYS Follow-up by: Willy Eddy, LPN,  October 06, 2010 12:18 PM    New/Updated Medications: LEVAQUIN 500 MG TABS (LEVOFLOXACIN) 1 once daily FOR 10 DAYS Prescriptions: LEVAQUIN 500 MG TABS (LEVOFLOXACIN) 1 once daily FOR 10 DAYS  #10 x 0   Entered by:   Willy Eddy, LPN   Authorized by:   Stacie Glaze MD   Signed by:   Willy Eddy, LPN on 25/95/6387   Method used:   Electronically to        [facility] (retail)       [address]       [place], [place]  [address]       Ph: [phone]       Fax: [phone]   RxID:   [id number]

## 2010-12-02 NOTE — Progress Notes (Signed)
Summary: REQUEST FOR RX  Phone Note Call from Patient   Caller: Patient   [phone] Summary of Call: Pt called to request a Rx for OTC meds:  fish oil, osteo bi-flex, vit d, citracal...... needs to be lifelong Rx dated 08/25/10  so that she can be reimbursed for past costs.  Initial call taken by: Debbra Riding,  November 23, 2010 2:25 PM    Prescriptions: CITRACAL PLUS  TABS (MULTIPLE MINERALS-VITAMINS) once daily  #90 x 3   Entered by:   Willy Eddy, LPN   Authorized by:   Stacie Glaze MD   Signed by:   Willy Eddy, LPN on 14/78/2956   Method used:   Print then Give to Patient   RxID:   [id number] VITAMIN D 28413 UNIT  CAPS (ERGOCALCIFEROL) one by mouth weekly  #5 x 6   Entered by:   Willy Eddy, LPN   Authorized by:   Stacie Glaze MD   Signed by:   Willy Eddy, LPN on 24/40/1027   Method used:   Print then Give to Patient   RxID:   [id number] OSTEO BI-FLEX REGULAR STRENGTH 250-200 MG TABS (GLUCOSAMINE-CHONDROITIN) 1 once daily  #90 x 3   Entered by:   Willy Eddy, LPN   Authorized by:   Stacie Glaze MD   Signed by:   Willy Eddy, LPN on 63/87/5643   Method used:   Print then Give to Patient   RxID:   [id number] FISH OIL CONCENTRATE 1000 MG CAPS (OMEGA-3 FATTY ACIDS) two by mouth BID  #120 x 11   Entered by:   Willy Eddy, LPN   Authorized by:   Stacie Glaze MD   Signed by:   Willy Eddy, LPN on 60/07/9322   Method used:   Print then Give to Patient   RxID:   [id number]

## 2011-01-22 ENCOUNTER — Other Ambulatory Visit: Payer: Self-pay | Admitting: Internal Medicine

## 2011-03-09 NOTE — Op Note (Signed)
[name], [name]         ACCOUNT NO.:  [id number]   MEDICAL RECORD NO.:  [id number]          PATIENT TYPE:  INP   LOCATION:  1619                         FACILITY:  [facility]   PHYSICIAN:  Dyke Brackett, M.D.    DATE OF BIRTH:  [birth year]   DATE OF PROCEDURE:  06/13/2008  DATE OF DISCHARGE:                               OPERATIVE REPORT   PREOPERATIVE DIAGNOSIS:  Displaced right femoral neck fracture.   POSTOPERATIVE DIAGNOSIS:  Displaced right femoral neck fracture.   OPERATION:  Right hip hemiarthroplasty (DePuy AML stem, +5 mm neck  length, 47 mm hip ball unipolar Press-Fit stem.   SURGEON:  Dyke Brackett, M.D.   ASSISTANTKatrinka Blazing, [place]   BLOOD LOSS:  Approximately 150.   PROCEDURE:  Sterile prep and drape in lateral position, posterior broach  the hip made with the leg and flexion, careful protection of the sciatic  nerve, the external rotators were identified and split with the hip in  internal rotation.  T capsulotomy was made in the hip revealing neck  fracture, head was removed, sized to be 47.  Provisional neck cut about  1 fingerbreadth above the lesser trochanter, progressive reaming and  rasping to 14.5 mm and a 14.5 mm broach to accept a 15 mm stem.  Acetabulum showed little, if any, wear.  Small fragments of bone were  removed from the acetabulum.  Trial reduction confirmed the above-  mentioned prosthetic component measurements followed by insertion of the  final prosthesis in appropriate amount of anteversion with good pressfit  obtained.  T capsulotomy was closed with interrupted Ethibond.  The  iliotibial band, gluteus maximus with a running Ethibond and a running 2-  0 Vicryl in subcutaneous tissues with skin clips.  Marcaine with  epinephrine 25% 20 mL infiltrated in the skin.  Lightly compressive  sterile dressing applied.  Taken to the recovery room in knee  immobilizer.      Dyke Brackett, M.D.  Electronically Signed     WDC/MEDQ  D:   06/13/2008  T:  06/13/2008  Job:  [id number]

## 2011-03-12 NOTE — Op Note (Signed)
[place]  Patient:    [name], [name]                MRN: [id number] Proc. Date: 04/04/01 Adm. Date:  [id number] Attending:  Janalyn Rouse                           Operative Report  PREOPERATIVE DIAGNOSIS:  Palpable mass, lateral portion of left breast, possibly an intramammary lymph node.  POSTOPERATIVE DIAGNOSIS:  Palpable mass, lateral portion of left breast, possibly an intramammary lymph node.  PROCEDURE:  Excision of left breast mass.  SURGEON:  Clinton D. Maple Hudson, M.D.  ANESTHESIA:  MAC.  DESCRIPTION OF PROCEDURE:  Patient placed on the operating table and the left breast prepped and draped in the usual fashion.  Prior to this, I had asked her to clearly mark and we then outlined the palpable nodule, which was rather difficult to feel.  We then prepped and draped the left breast.  A little radial incision over the palpable nodule, very lateral on the left breast, was then made after infiltrating 1% Xylocaine with adrenalin.  This was excised and hemostasis obtained with the cautery.  Specimen submitted to the pathologist.  A subcuticular closure of 4-0 Monocryl and Steri-Strips carried out.  Dressing applied.  Patient transferred to the recovery room in satisfactory condition, having tolerated the procedure well. DD:  04/04/01 TD:  04/04/01 Job: [id number]

## 2011-03-12 NOTE — Discharge Summary (Signed)
[name], [name]         ACCOUNT NO.:  [id number]   MEDICAL RECORD NO.:  [id number]          PATIENT TYPE:  INP   LOCATION:  1619                         FACILITY:  [facility]   PHYSICIAN:  Dyke Brackett, M.D.    DATE OF BIRTH:  09-03-1932   DATE OF ADMISSION:  06/12/2008  DATE OF DISCHARGE:  06/16/2008                               DISCHARGE SUMMARY   ADMISSION DIAGNOSIS:  Right hip femoral neck displaced fracture.   DISCHARGE DIAGNOSIS:  Status post right hip hemiarthroplasty for right  femoral neck fracture.   HOSPITAL COURSE:  Patient presented to the emergency department on  June 12, 2008, a 77 year old female who fell earlier in the day trying  to get a pear from a tree.  Fell directly on the right side.  Presented  with severe right hip pain to the emergency department.  No issues with  any loss of consciousness.  Evaluated by the emergency department and  consulted orthopedics, who determined that the patient had a displaced  right femoral neck fracture.  Patient was then brought to 5000, admitted  to the orthopedic floor at [facility] and set up to have a  right hip hemiarthroplasty, following informed consent signed on June 13, 2008.  Preoperative diagnosis was right hip femoral neck fracture.  Postoperative was the same.  Procedure was right hip hemiarthroplasty by  Dr. Madelon Lucas, assisted by Tammy Plummer, PA.  No complications.  Patient  was transferred to the PACU in stable condition.  From the PACU, the  patient was transferred later that day, on June 13, 2008 to the floor.  The first postop day was June 14, 2008.  Patient was doing well.  Hemoglobin was at 9.1.  Vital signs were stable.  Already up walking  with therapy and was not having any issues with dizziness or  lightheadedness.  Patient started having 50% weightbearing on the right  lower extremity.   On postoperative day #2, June 15, 2008, hemoglobin 9, hematocrit 26.9.  Vital signs  stable and afebrile.  Patient was doing well with minimal  pain.  __________ physical therapy.   Postoperative day #3 was June 16, 2008, patient was already walking  140 feet with rolling walker with physical therapy and doing well.  Hemoglobin was stable at 9, hematocrit 26.4.  Vital signs were stable.  She is afebrile.  Patient is discharged home following a right hip  hemiarthroplasty.   ASSESSMENT/PLAN:  [name] is a 77 year old female status post  right hip hemiarthroplasty, doing well.  Is being discharged home in  stable condition.  Regular diet.  Weightbearing as tolerated to the  right lower extremity.  Plan for follow up with Dr. Madelon Lucas in the  office in 10 days for staple removal.   DISCHARGE MEDICATIONS:  1. Percocet 5/325 1-2 tablets every 4-6 hours p.r.n. pain.  2. Robaxin 500 mg 1 tablet p.o. every 6-8 hours p.r.n. pain.  3. Lovenox 40 mg subcu in the a.m. each day for a total of 15 days      postoperatively.  4. Ziac 5/6.25 mg daily.  5. Vitamin D 400 IU  daily.  6. Fish oil 1 gm p.o. daily.      [name], PA      Dyke Brackett, M.D.  Electronically Signed    JBS/MEDQ  D:  09/11/2008  T:  09/11/2008  Job:  [id number]

## 2011-03-12 NOTE — Assessment & Plan Note (Signed)
[facility]                         GASTROENTEROLOGY OFFICE NOTE   CARRI, SPILLERS                MRN:          [id number]  DATE:11/11/2006                            DOB:          08/03/1932    Cris comes in, says she has been having some stomach problems, but  her main concern is whether she is having recurrent diverticulitis.  Her  sister is a Engineer, civil (consulting) who says that she thinks that it has been happening  too frequently, and something more definitive needs to be done.  She  says that when she takes Cipro it helps, but she also cuts back on her  dietary input.  She has lost about 8 pounds, she says, in the last  several months.  Her last colonoscopy examination was in February 2006,  and she had some mild diverticular disease, a fixed left colon, a very  tortuous left colon, which she has always had.  She had surgery a number  of years ago for a large polypoid mass in her colon, and I think that  her left colon is fixed because of adhesions, and that is why it is so  tortuous as well.  I really think a great deal of her symptomatology is  on this basis.  She says she does have some blood and mucous when she  has bowel movements.  She says she gets lots of lower abdominal pain,  and this is alleviated after she starts taking Cipro and goes on a low  residue, full liquid-type diet.  I think that the symptoms might be  related just from the tortuosity of her colon, and she is getting  impacted or blocked up to some degree from the stenotic aspect and  tortuosity of her colon involving the left side, and that is why she is  having these symptoms.  Instead of taking fiber, I have advised her to  take some stool softeners and so on to help this.  Her mother did have  colon cancer, so she has always been concerned, but she had some small  polyps, but overall, she has had none recently, and I really do not  think there is anything other than this  that is causing her difficulty.   Her past medical history reveals hypertension and some arthritis and the  surgery that she had on her colon that we already discussed.   FAMILY HISTORY:  Is really noncontributory, except for her mother having  the colon cancer, and she does have a sister with breast cancer and  heart disease.   SOCIAL HISTORY:  Is noncontributory.   REVIEW OF SYSTEMS:  Reveals only some back pain at times.   PHYSICAL EXAMINATION:  She is 5 feet 5-1/2 inches, weight 118.  Oropharynx was negative.  NECK:  Supraclavicular, her neck was normal..  CHEST:  Was clear.  HEART:  Revealed a regular rhythm.  ABDOMEN:  Soft, flat, no masses or organomegaly.  Nontender to  palpation.  There were no bruits or rubs.  __________  was negative.  EXTREMITIES: Unremarkable.  RECTAL:  Deferred.   IMPRESSION:  Recurrent lower abdominal cramping-type pain  with some  blood and mucous at times, which I think is probably related to  hemorrhoids.  I told her I really was not convinced this was linked to  her diverticular disease or diverticulitis.  I thought she ought to be  on some MiraLax.  Revert predominantly to the low residue diet or clear  liquid diet for 24-48 hours and see if she cannot relieve her symptoms  this way.  She ought to get a __________ better emptying of her left  colon as much as possible, and I think that a lot of her problems could  be eliminated.  I hope that this would be the case.   IMPRESSION:  1. Irritable bowel syndrome with a fixed left colon.  2. Status post colon polyps.  3. Diverticular disease.  4. Arteriosclerotic cardiovascular disease.  5. Arthritis.   RECOMMENDATIONS:  Use some flora-Q as well, along with the MiraLax, and  hopefully the above measures will be better.  She did have a CT scan  which did not reveal anything of any significance in her lower abdomen,  and I think this was reassuring to her.  Maybe we would need to do a  barium  enema to see the anatomy of her colon a little better.  Maybe  this might be helpful to her as well.     Ulyess Mort, MD  Electronically Signed    SML/MedQ  DD: 11/11/2006  DT: 11/11/2006  Job #: [id number]   cc:   Stacie Glaze, MD

## 2011-04-01 ENCOUNTER — Encounter: Payer: Self-pay | Admitting: Internal Medicine

## 2011-04-09 ENCOUNTER — Ambulatory Visit (INDEPENDENT_AMBULATORY_CARE_PROVIDER_SITE_OTHER): Payer: Medicare Other | Admitting: Internal Medicine

## 2011-04-09 ENCOUNTER — Encounter: Payer: Self-pay | Admitting: Internal Medicine

## 2011-04-09 VITALS — BP 130/76 | HR 60 | Temp 98.0°F | Resp 14 | Ht 65.0 in | Wt 116.0 lb

## 2011-04-09 DIAGNOSIS — Z0000 Encounter for general adult medical examination without abnormal findings: Secondary | ICD-10-CM

## 2011-04-09 DIAGNOSIS — E785 Hyperlipidemia, unspecified: Secondary | ICD-10-CM

## 2011-04-09 DIAGNOSIS — K589 Irritable bowel syndrome without diarrhea: Secondary | ICD-10-CM

## 2011-04-09 DIAGNOSIS — D649 Anemia, unspecified: Secondary | ICD-10-CM

## 2011-04-09 DIAGNOSIS — I10 Essential (primary) hypertension: Secondary | ICD-10-CM

## 2011-04-09 LAB — BASIC METABOLIC PANEL
BUN: 19 mg/dL (ref 6–23)
CO2: 29 mEq/L (ref 19–32)
Calcium: 9.2 mg/dL (ref 8.4–10.5)
Chloride: 103 mEq/L (ref 96–112)
Creatinine, Ser: 0.7 mg/dL (ref 0.4–1.2)
GFR: 84.81 mL/min (ref >60.00)
Glucose, Bld: 90 mg/dL (ref 70–99)
Potassium: 3.6 mEq/L (ref 3.5–5.1)
Sodium: 138 mEq/L (ref 135–145)

## 2011-04-09 LAB — LIPID PANEL
Cholesterol: 204 mg/dL — ABNORMAL HIGH (ref 0–200)
HDL: 58.4 mg/dL (ref >39.00)
Total CHOL/HDL Ratio: 3
Triglycerides: 116 mg/dL (ref 0.0–149.0)
VLDL: 23.2 mg/dL (ref 0.0–40.0)

## 2011-04-09 LAB — HEPATIC FUNCTION PANEL
ALT: 17 U/L (ref 0–35)
AST: 26 U/L (ref 0–37)
Albumin: 4.5 g/dL (ref 3.5–5.2)
Alkaline Phosphatase: 45 U/L (ref 39–117)
Bilirubin, Direct: 0.1 mg/dL (ref 0.0–0.3)
Total Bilirubin: 0.9 mg/dL (ref 0.3–1.2)
Total Protein: 7.5 g/dL (ref 6.0–8.3)

## 2011-04-09 LAB — CBC WITH DIFFERENTIAL/PLATELET
Basophils Absolute: 0 10*3/uL (ref 0.0–0.1)
Basophils Relative: 0.6 % (ref 0.0–3.0)
Eosinophils Absolute: 0.1 10*3/uL (ref 0.0–0.7)
Eosinophils Relative: 1 % (ref 0.0–5.0)
HCT: 34.3 % — ABNORMAL LOW (ref 36.0–46.0)
Hemoglobin: 11.5 g/dL — ABNORMAL LOW (ref 12.0–15.0)
Lymphocytes Relative: 33.8 % (ref 12.0–46.0)
Lymphs Abs: 2.6 10*3/uL (ref 0.7–4.0)
MCHC: 33.7 g/dL (ref 30.0–36.0)
MCV: 94.6 fl (ref 78.0–100.0)
Monocytes Absolute: 0.5 10*3/uL (ref 0.1–1.0)
Monocytes Relative: 6.5 % (ref 3.0–12.0)
Neutro Abs: 4.5 10*3/uL (ref 1.4–7.7)
Neutrophils Relative %: 58.1 % (ref 43.0–77.0)
Platelets: 153 10*3/uL (ref 150.0–400.0)
RBC: 3.62 Mil/uL — ABNORMAL LOW (ref 3.87–5.11)
RDW: 12.5 % (ref 11.5–14.6)
WBC: 7.7 10*3/uL (ref 4.5–10.5)

## 2011-04-09 LAB — POCT URINALYSIS DIPSTICK
Bilirubin, UA: NEGATIVE
Blood, UA: NEGATIVE
Glucose, UA: NEGATIVE
Ketones, UA: NEGATIVE
Leukocytes, UA: NEGATIVE
Nitrite, UA: NEGATIVE
Protein, UA: NEGATIVE
Spec Grav, UA: 1.015
Urobilinogen, UA: 0.2
pH, UA: 6

## 2011-04-09 LAB — LDL CHOLESTEROL, DIRECT: Direct LDL: 116.6 mg/dL

## 2011-04-09 NOTE — Progress Notes (Signed)
Addended by: Bonnye Fava on: 04/09/2011 04:03 PM   Modules accepted: Orders

## 2011-04-09 NOTE — Progress Notes (Signed)
Subjective:    [name] is a 77 y.o. female who presents for Medicare Annual/Subsequent preventive examination.  She is also followed for osteoporosis having had a recent bone density scan showing osteoporosis in her hip she has been on calcium and vitamin D.  She has hypertension that is well-controlled she denies any chest pain shortness of breath PND orthopnea she also has a history of hyperlipidemia.    Preventive Screening-Counseling & Management  Tobacco History  Smoking status  . Never Smoker   Smokeless tobacco  . Not on file     Problems Prior to Visit 1.   Current Problems (verified) Patient Active Problem List  Diagnoses  . HYPERLIPIDEMIA, MILD  . ANEMIA  . HYPERTENSION  . INTERNAL HEMORRHOIDS WITH OTHER COMPLICATION  . ACUTE FRONTAL SINUSITIS  . DIVERTICULOSIS, COLON  . IRRITABLE BOWEL SYNDROME  . COLONIC POLYPS, ADENOMATOUS, BENIGN  . RECTAL BLEEDING  . URETHRAL CARUNCLE  . HAIR LOSS  . DEGENERATIVE JOINT DISEASE, MILD  . OSTEOPOROSIS  . CHEST PAIN, ATYPICAL    Medications Prior to Visit Current Outpatient Prescriptions on File Prior to Visit  Medication Sig Dispense Refill  . b complex vitamins tablet Take 1 tablet by mouth daily.        . bisoprolol-hydrochlorothiazide (ZIAC) 5-6.25 MG per tablet TAKE ONE TABLET BY MOUTH EVERY DAY  90 tablet  3  . ergocalciferol (VITAMIN D2) 50000 UNITS capsule Take 50,000 Units by mouth once a week.        . Glucosamine-Chondroitin 250-200 MG TABS Take by mouth daily.        . hydrocortisone-pramoxine (ANALPRAM-HC) 2.5-1 % rectal cream Place rectally 2 (two) times daily as needed.        . meloxicam (MOBIC) 15 MG tablet Take 15 mg by mouth daily as needed.        . Multiple Minerals-Vitamins (CITRACAL PLUS) TABS Take by mouth daily.        . multivitamin (THERAGRAN) per tablet Take 1 tablet by mouth daily.        . Omega-3 Fatty Acids (FISH OIL) 1000 MG CAPS Take by mouth 2 (two) times daily.            Current Medications (verified) Current Outpatient Prescriptions  Medication Sig Dispense Refill  . b complex vitamins tablet Take 1 tablet by mouth daily.        . bisoprolol-hydrochlorothiazide (ZIAC) 5-6.25 MG per tablet TAKE ONE TABLET BY MOUTH EVERY DAY  90 tablet  3  . ergocalciferol (VITAMIN D2) 50000 UNITS capsule Take 50,000 Units by mouth once a week.        . Glucosamine-Chondroitin 250-200 MG TABS Take by mouth daily.        . hydrocortisone-pramoxine (ANALPRAM-HC) 2.5-1 % rectal cream Place rectally 2 (two) times daily as needed.        . meloxicam (MOBIC) 15 MG tablet Take 15 mg by mouth daily as needed.        . Multiple Minerals-Vitamins (CITRACAL PLUS) TABS Take by mouth daily.        . multivitamin (THERAGRAN) per tablet Take 1 tablet by mouth daily.        . Omega-3 Fatty Acids (FISH OIL) 1000 MG CAPS Take by mouth 2 (two) times daily.           Allergies (verified) Codeine   PAST HISTORY  Family History Family History  Problem Relation Age of Onset  . Colon cancer Mother   . Cancer  Father   . Lung cancer Father   . Ovarian cancer Sister   . Breast cancer Sister   . Liver cancer Brother     Social History History  Substance Use Topics  . Smoking status: Never Smoker   . Smokeless tobacco: Not on file  . Alcohol Use: No     Are there smokers in your home (other than you)? No  Risk Factors Current exercise habits: Gym/ health club routine includes cardio.  Dietary issues discussed: diet for weigth gain   Cardiac risk factors: advanced age (older than 72 for men, 29 for women), dyslipidemia and hypertension.  Depression Screen (Note: if answer to either of the following is "Yes", a more complete depression screening is indicated)   Over the past two weeks, have you felt down, depressed or hopeless? No  Over the past two weeks, have you felt little interest or pleasure in doing things? No  Have you lost interest or pleasure in daily life? No  Do  you often feel hopeless? No  Do you cry easily over simple problems? No  Activities of Daily Living In your present state of health, do you have any difficulty performing the following activities?:  Driving? No Managing money?  No Feeding yourself? No Getting from bed to chair? No Climbing a flight of stairs? No Preparing food and eating?: No Bathing or showering? No Getting dressed: No Getting to the toilet? No Using the toilet:No Moving around from place to place: No In the past year have you fallen or had a near fall?:No   Are you sexually active?  No  Do you have more than one partner?  No  Hearing Difficulties: No Do you often ask people to speak up or repeat themselves? No Do you experience ringing or noises in your ears? No Do you have difficulty understanding soft or whispered voices? No   Do you feel that you have a problem with memory? No  Do you often misplace items? No  Do you feel safe at home?  No  Cognitive Testing  Alert? Yes  Normal Appearance?Yes  Oriented to person? Yes  Place? Yes   Time? Yes  Recall of three objects?  Yes  Can perform simple calculations? Yes  Displays appropriate judgment?Yes  Can read the correct time from a watch face?Yes   Advanced Directives have been discussed with the patient? Yes  List the Names of Other Physician/Practitioners you currently use: 1.    Indicate any recent Medical Services you may have received from other than [facility] providers in the past year (date may be approximate).  Immunization History  Administered Date(s) Administered  . H1N1 10/02/2008  . Influenza Whole 09/04/2007, 07/30/2008, 10/08/2009, 09/11/2010  . Pneumococcal Polysaccharide 12/04/2007  . Td 04/02/2008    Screening Tests Health Maintenance  Topic Date Due  . Zostavax  02/20/1994  . Influenza Vaccine  07/26/2011  . Tetanus/tdap  04/02/2018  . Colonoscopy  07/06/2020  . Pneumococcal Polysaccharide Vaccine Age 65 And Over  Completed     All answers were reviewed with the patient and necessary referrals were made:  Carrie Mew   04/09/2011   History reviewed: allergies, current medications, past family history, past medical history, past social history, past surgical history and problem list  Review of Systems A comprehensive review of systems was negative.    Objective:     Vision by Snellen chart: right eye:20/20, left eye:20/20  Body mass index is 19.30 kg/(m^2). BP 130/76  Pulse 60  Temp 98 F (36.7 C)  Resp 14  Ht 5\' 5"  (1.651 m)  Wt 116 lb (52.617 kg)  BMI 19.30 kg/m2  BP 130/76  Pulse 60  Temp 98 F (36.7 C)  Resp 14  Ht 5\' 5"  (1.651 m)  Wt 116 lb (52.617 kg)  BMI 19.30 kg/m2  General Appearance:    Alert, cooperative, no distress, appears stated age  Head:    Normocephalic, without obvious abnormality, atraumatic  Eyes:    PERRL, conjunctiva/corneas clear, EOM's intact, fundi    benign, both eyes  Ears:    Normal TM's and external ear canals, both ears  Nose:   Nares normal, septum midline, mucosa normal, no drainage    or sinus tenderness  Throat:   Lips, mucosa, and tongue normal; teeth and gums normal  Neck:   Supple, symmetrical, trachea midline, no adenopathy;    thyroid:  no enlargement/tenderness/nodules; no carotid   bruit or JVD  Back:     Symmetric, no curvature, ROM normal, no CVA tenderness  Lungs:     Clear to auscultation bilaterally, respirations unlabored  Chest Wall:    No tenderness or deformity   Heart:    Regular rate and rhythm, S1 and S2 normal, no murmur, rub   or gallop  Breast Exam:    No tenderness, masses, or nipple abnormality  Abdomen:     Soft, non-tender, bowel sounds active all four quadrants,    no masses, no organomegaly  Genitalia:    Normal female without lesion, discharge or tenderness  Rectal:    Normal tone, normal prostate, no masses or tenderness;   guaiac negative stool  Extremities:   Extremities normal, atraumatic, no cyanosis or  edema  Pulses:   2+ and symmetric all extremities  Skin:   Skin color, texture, turgor normal, no rashes or lesions  Lymph nodes:   Cervical, supraclavicular, and axillary nodes normal  Neurologic:   CNII-XII intact, normal strength, sensation and reflexes    throughout       Assessment:     patient's blood pressure is well-controlled.  Her lipids are to be measured today with a lipid panel.  She has a history of osteoporosis and is on vitamin D supplementation and vitamin D level will be drawn today she has a history of anemia and a CBC differential will be drawn today      This is a routine physical examination for this healthy  Female. Reviewed all health maintenance protocols including mammography colonoscopy bone density and reviewed appropriate screening labs. Her immunization history was reviewed as well as her current medications and allergies refills of her chronic medications were given and the plan for yearly health maintenance was discussed all orders and referrals were made as appropriate.  Plan:     During the course of the visit the patient was educated and counseled about appropriate screening and preventive services including:    Influenza vaccine  Diet review for nutrition referral? Yes ____  Not Indicated _x___   Patient Instructions (the written plan) was given to the patient.  Medicare Attestation I have personally reviewed: The patient's medical and social history Their use of alcohol, tobacco or illicit drugs Their current medications and supplements The patient's functional ability including ADLs,fall risks, home safety risks, cognitive, and hearing and visual impairment Diet and physical activities Evidence for depression or mood disorders  The patient's weight, height, BMI, and visual acuity have been recorded in the chart.  I have made referrals,  counseling, and provided education to the patient based on review of the above and I have provided the  patient with a written personalized care plan for preventive services.     Carrie Mew   04/09/2011

## 2011-04-10 LAB — VITAMIN D 25 HYDROXY (VIT D DEFICIENCY, FRACTURES): Vit D, 25-Hydroxy: 63 ng/mL (ref 30–89)

## 2011-06-25 ENCOUNTER — Telehealth: Payer: Self-pay | Admitting: *Deleted

## 2011-06-25 NOTE — Telephone Encounter (Signed)
Pt informed

## 2011-06-25 NOTE — Telephone Encounter (Signed)
Pt is calling to get lab results from last office visit, please.

## 2011-09-05 ENCOUNTER — Other Ambulatory Visit: Payer: Self-pay | Admitting: Internal Medicine

## 2011-09-07 ENCOUNTER — Telehealth: Payer: Self-pay | Admitting: Internal Medicine

## 2011-09-07 MED ORDER — METRONIDAZOLE 250 MG PO TABS: 250.0000 mg | ORAL_TABLET | Freq: Four times a day (QID) | ORAL | Status: AC

## 2011-09-07 MED ORDER — CIPROFLOXACIN HCL 500 MG PO TABS: 500.0000 mg | ORAL_TABLET | Freq: Two times a day (BID) | ORAL | Status: AC

## 2011-09-07 NOTE — Telephone Encounter (Signed)
Pt has diverticulitis flare up and is req an abx to be called in to [facility] on [address] in [place], [place] [phone]. Pt said that she was told by Dr Lovell Sheehan to call if she ever had pain in her side, and she said that she has had pain for 3 days.

## 2011-09-07 NOTE — Telephone Encounter (Signed)
Per dr Lovell Sheehan may have cipro 500 bid for 10 days and flagyl 250 qid for 10 days

## 2011-09-30 ENCOUNTER — Ambulatory Visit (INDEPENDENT_AMBULATORY_CARE_PROVIDER_SITE_OTHER): Payer: Medicare Other | Admitting: Internal Medicine

## 2011-09-30 ENCOUNTER — Encounter: Payer: Self-pay | Admitting: Internal Medicine

## 2011-09-30 VITALS — BP 130/78 | HR 72 | Temp 98.1°F | Resp 16 | Ht 65.5 in | Wt 116.0 lb

## 2011-09-30 DIAGNOSIS — E785 Hyperlipidemia, unspecified: Secondary | ICD-10-CM

## 2011-09-30 DIAGNOSIS — I10 Essential (primary) hypertension: Secondary | ICD-10-CM

## 2011-09-30 DIAGNOSIS — Z23 Encounter for immunization: Secondary | ICD-10-CM

## 2011-09-30 DIAGNOSIS — K589 Irritable bowel syndrome without diarrhea: Secondary | ICD-10-CM

## 2011-09-30 MED ORDER — HYDROCORTISONE ACE-PRAMOXINE 2.5-1 % RE CREA: TOPICAL_CREAM | Freq: Two times a day (BID) | RECTAL | Status: DC

## 2011-09-30 NOTE — Progress Notes (Signed)
Subjective:    Patient ID: [name], female    DOB: 14-Jul-1932, 77 y.o.   MRN: [id number]  HPI  Patient is a 77 year old female followed for hypertension hyperlipidemia and a history of anemia.  Generally she has been doing well she is treated for irritable bowel syndrome high fiber diet she has a history of diverticulosis but no current symptoms of diverticulitis she's been treated for diverticulitis in the past 2 years.    Review of Systems  Constitutional: Negative for activity change, appetite change and fatigue.  HENT: Negative for ear pain, congestion, neck pain, postnasal drip and sinus pressure.   Eyes: Negative for redness and visual disturbance.  Respiratory: Negative for cough, shortness of breath and wheezing.   Gastrointestinal: Negative for abdominal pain and abdominal distention.  Genitourinary: Negative for dysuria, frequency and menstrual problem.  Musculoskeletal: Negative for myalgias, joint swelling and arthralgias.  Skin: Negative for rash and wound.  Neurological: Negative for dizziness, weakness and headaches.  Hematological: Negative for adenopathy. Does not bruise/bleed easily.  Psychiatric/Behavioral: Negative for sleep disturbance and decreased concentration.       Objective:   Physical Exam  Nursing note and vitals reviewed. Constitutional: She is oriented to person, place, and time. She appears well-developed and well-nourished. No distress.  HENT:  Head: Normocephalic and atraumatic.  Right Ear: External ear normal.  Left Ear: External ear normal.  Nose: Nose normal.  Mouth/Throat: Oropharynx is clear and moist.  Eyes: Conjunctivae and EOM are normal. Pupils are equal, round, and reactive to light.  Neck: Normal range of motion. Neck supple. No JVD present. No tracheal deviation present. No thyromegaly present.  Cardiovascular: Normal rate, regular rhythm, normal heart sounds and intact distal pulses.   No murmur  heard. Pulmonary/Chest: Effort normal and breath sounds normal. She has no wheezes. She exhibits no tenderness.  Abdominal: Soft. Bowel sounds are normal.  Musculoskeletal: Normal range of motion. She exhibits no edema and no tenderness.  Lymphadenopathy:    She has no cervical adenopathy.  Neurological: She is alert and oriented to person, place, and time. She has normal reflexes. No cranial nerve deficit.  Skin: Skin is warm and dry. She is not diaphoretic.  Psychiatric: She has a normal mood and affect. Her behavior is normal.     Past Medical History  Diagnosis Date  . Diverticulosis   . Osteoporosis   . Hypertension   . Irritable bowel syndrome   . UTI (lower urinary tract infection)     History   Social History  . Marital Status: Married    Spouse Name: N/A    Number of Children: N/A  . Years of Education: N/A   Occupational History  . retired    Social History Main Topics  . Smoking status: Never Smoker   . Smokeless tobacco: Not on file  . Alcohol Use: No  . Drug Use: No  . Sexually Active: Not Currently   Other Topics Concern  . Not on file   Social History Narrative  . No narrative on file    Past Surgical History  Procedure Date  . Lumbar laminectomy   . Lumbar fusion   . Partial resection of colon for tumor   . Colon polyps   . Total hip arthroplasty     right  . Appendectomy     Family History  Problem Relation Age of Onset  . Colon cancer Mother   . Cancer Father   . Lung cancer Father   .  Ovarian cancer Sister   . Breast cancer Sister   . Liver cancer Brother     Allergies  Allergen Reactions  . Codeine     REACTION: headaches  . Flagyl (Metronidazole Hcl)     nausea    Current Outpatient Prescriptions on File Prior to Visit  Medication Sig Dispense Refill  . b complex vitamins tablet Take 1 tablet by mouth daily.        . bisoprolol-hydrochlorothiazide (ZIAC) 5-6.25 MG per tablet TAKE ONE TABLET BY MOUTH EVERY DAY  90  tablet  3  . Glucosamine-Chondroitin 250-200 MG TABS Take by mouth daily.        . Multiple Minerals-Vitamins (CITRACAL PLUS) TABS Take by mouth daily.        . multivitamin (THERAGRAN) per tablet Take 1 tablet by mouth daily.        . Omega-3 Fatty Acids (FISH OIL) 1000 MG CAPS Take by mouth 2 (two) times daily.        . Vitamin D, Ergocalciferol, (DRISDOL) 50000 UNITS CAPS TAKE ONE CAPSULE BY MOUTH ONCE A WEEK  5 capsule  3    BP 130/78  Pulse 72  Temp 98.1 F (36.7 C)  Resp 16  Ht 5' 5.5" (1.664 m)  Wt 116 lb (52.617 kg)  BMI 19.01 kg/m2        Assessment & Plan:  Blood pressure is well controlled on current medications Patient has a history of anemia CBC differential should be monitored patient's lipids have been monitored and stable patient's irritable bowel is treated with high fiber diet no current symptoms of diverticulitis she's a history of allergic rhinitis and saline lavage was discussed

## 2011-09-30 NOTE — Patient Instructions (Signed)
The patient is instructed to continue all medications as prescribed. Schedule followup with check out clerk upon leaving the clinic  

## 2011-10-08 ENCOUNTER — Ambulatory Visit: Payer: BC Managed Care – PPO | Admitting: Internal Medicine

## 2011-10-15 ENCOUNTER — Other Ambulatory Visit: Payer: Self-pay | Admitting: Internal Medicine

## 2011-11-24 DIAGNOSIS — L821 Other seborrheic keratosis: Secondary | ICD-10-CM | POA: Diagnosis not present

## 2011-11-24 DIAGNOSIS — D239 Other benign neoplasm of skin, unspecified: Secondary | ICD-10-CM | POA: Diagnosis not present

## 2011-11-24 DIAGNOSIS — Z85828 Personal history of other malignant neoplasm of skin: Secondary | ICD-10-CM | POA: Diagnosis not present

## 2011-11-24 DIAGNOSIS — L570 Actinic keratosis: Secondary | ICD-10-CM | POA: Diagnosis not present

## 2011-11-24 DIAGNOSIS — D485 Neoplasm of uncertain behavior of skin: Secondary | ICD-10-CM | POA: Diagnosis not present

## 2011-11-24 DIAGNOSIS — L819 Disorder of pigmentation, unspecified: Secondary | ICD-10-CM | POA: Diagnosis not present

## 2012-01-24 ENCOUNTER — Other Ambulatory Visit: Payer: Self-pay | Admitting: Internal Medicine

## 2012-02-24 ENCOUNTER — Other Ambulatory Visit: Payer: Self-pay | Admitting: Internal Medicine

## 2012-04-14 ENCOUNTER — Encounter: Payer: Self-pay | Admitting: Internal Medicine

## 2012-04-14 ENCOUNTER — Ambulatory Visit (INDEPENDENT_AMBULATORY_CARE_PROVIDER_SITE_OTHER): Payer: Medicare Other | Admitting: Internal Medicine

## 2012-04-14 ENCOUNTER — Ambulatory Visit: Payer: BC Managed Care – PPO | Admitting: Internal Medicine

## 2012-04-14 VITALS — BP 124/70 | HR 72 | Temp 98.3°F | Resp 16 | Ht 66.0 in | Wt 116.0 lb

## 2012-04-14 DIAGNOSIS — Z0000 Encounter for general adult medical examination without abnormal findings: Secondary | ICD-10-CM

## 2012-04-14 DIAGNOSIS — M810 Age-related osteoporosis without current pathological fracture: Secondary | ICD-10-CM

## 2012-04-14 DIAGNOSIS — K589 Irritable bowel syndrome without diarrhea: Secondary | ICD-10-CM | POA: Diagnosis not present

## 2012-04-14 DIAGNOSIS — I10 Essential (primary) hypertension: Secondary | ICD-10-CM

## 2012-04-14 DIAGNOSIS — R252 Cramp and spasm: Secondary | ICD-10-CM

## 2012-04-14 DIAGNOSIS — E785 Hyperlipidemia, unspecified: Secondary | ICD-10-CM

## 2012-04-14 DIAGNOSIS — D649 Anemia, unspecified: Secondary | ICD-10-CM | POA: Diagnosis not present

## 2012-04-14 LAB — CBC WITH DIFFERENTIAL/PLATELET
Basophils Absolute: 0.1 10*3/uL (ref 0.0–0.1)
Basophils Relative: 0.8 % (ref 0.0–3.0)
Eosinophils Absolute: 0.1 10*3/uL (ref 0.0–0.7)
Eosinophils Relative: 1.1 % (ref 0.0–5.0)
HCT: 36.9 % (ref 36.0–46.0)
Hemoglobin: 12.1 g/dL (ref 12.0–15.0)
Lymphocytes Relative: 24 % (ref 12.0–46.0)
Lymphs Abs: 2.4 10*3/uL (ref 0.7–4.0)
MCHC: 32.9 g/dL (ref 30.0–36.0)
MCV: 94.9 fl (ref 78.0–100.0)
Monocytes Absolute: 0.6 10*3/uL (ref 0.1–1.0)
Monocytes Relative: 5.5 % (ref 3.0–12.0)
Neutro Abs: 6.9 10*3/uL (ref 1.4–7.7)
Neutrophils Relative %: 68.6 % (ref 43.0–77.0)
Platelets: 160 10*3/uL (ref 150.0–400.0)
RBC: 3.89 Mil/uL (ref 3.87–5.11)
RDW: 12.4 % (ref 11.5–14.6)
WBC: 10.1 10*3/uL (ref 4.5–10.5)

## 2012-04-14 LAB — HEPATIC FUNCTION PANEL
ALT: 16 U/L (ref 0–35)
AST: 23 U/L (ref 0–37)
Albumin: 4.2 g/dL (ref 3.5–5.2)
Alkaline Phosphatase: 42 U/L (ref 39–117)
Bilirubin, Direct: 0 mg/dL (ref 0.0–0.3)
Total Bilirubin: 0.7 mg/dL (ref 0.3–1.2)
Total Protein: 7.2 g/dL (ref 6.0–8.3)

## 2012-04-14 LAB — BASIC METABOLIC PANEL
BUN: 20 mg/dL (ref 6–23)
CO2: 29 mEq/L (ref 19–32)
Calcium: 9.1 mg/dL (ref 8.4–10.5)
Chloride: 101 mEq/L (ref 96–112)
Creatinine, Ser: 0.8 mg/dL (ref 0.4–1.2)
GFR: 71.63 mL/min (ref >60.00)
Glucose, Bld: 90 mg/dL (ref 70–99)
Potassium: 3.9 mEq/L (ref 3.5–5.1)
Sodium: 139 mEq/L (ref 135–145)

## 2012-04-14 LAB — LIPID PANEL
Cholesterol: 196 mg/dL (ref 0–200)
HDL: 62.2 mg/dL (ref >39.00)
LDL Cholesterol: 110 mg/dL — ABNORMAL HIGH (ref 0–99)
Total CHOL/HDL Ratio: 3
Triglycerides: 119 mg/dL (ref 0.0–149.0)
VLDL: 23.8 mg/dL (ref 0.0–40.0)

## 2012-04-14 LAB — TSH: TSH: 1.14 u[IU]/mL (ref 0.35–5.50)

## 2012-04-14 LAB — MAGNESIUM: Magnesium: 2.3 mg/dL (ref 1.5–2.5)

## 2012-04-14 MED ORDER — MAGNESIUM OXIDE 400 MG PO TABS: 400.0000 mg | ORAL_TABLET | Freq: Every day | ORAL | Status: AC

## 2012-04-14 NOTE — Patient Instructions (Signed)
The patient is instructed to continue all medications as prescribed. Schedule followup with check out clerk upon leaving the clinic  

## 2012-04-14 NOTE — Progress Notes (Signed)
Subjective:    [name] is a 78 y.o. female who presents for Medicare Annual/Subsequent preventive examination.  Preventive Screening-Counseling & Management  Tobacco History  Smoking status  . Never Smoker   Smokeless tobacco  . Not on file     Problems Prior to Visit 1.   Current Problems (verified) Patient Active Problem List  Diagnosis  . HYPERLIPIDEMIA, MILD  . ANEMIA  . HYPERTENSION  . INTERNAL HEMORRHOIDS WITH OTHER COMPLICATION  . ACUTE FRONTAL SINUSITIS  . DIVERTICULOSIS, COLON  . IRRITABLE BOWEL SYNDROME  . COLONIC POLYPS, ADENOMATOUS, BENIGN  . RECTAL BLEEDING  . URETHRAL CARUNCLE  . HAIR LOSS  . DEGENERATIVE JOINT DISEASE, MILD  . OSTEOPOROSIS  . CHEST PAIN, ATYPICAL    Medications Prior to Visit Current Outpatient Prescriptions on File Prior to Visit  Medication Sig Dispense Refill  . b complex vitamins tablet Take 1 tablet by mouth daily.        . bisoprolol-hydrochlorothiazide (ZIAC) 5-6.25 MG per tablet TAKE ONE TABLET BY MOUTH EVERY DAY  90 tablet  3  . Glucosamine-Chondroitin 250-200 MG TABS Take by mouth daily.        . hydrocortisone-pramoxine (ANALPRAM-HC) 2.5-1 % rectal cream Place rectally 2 (two) times daily.  30 g  3  . meloxicam (MOBIC) 15 MG tablet TAKE ONE TABLET BY MOUTH EVERY DAY AS NEEDED  30 tablet  0  . Multiple Minerals-Vitamins (CITRACAL PLUS) TABS Take by mouth daily.        . multivitamin (THERAGRAN) per tablet Take 1 tablet by mouth daily.        . Omega-3 Fatty Acids (FISH OIL) 1000 MG CAPS Take by mouth 2 (two) times daily.        . Vitamin D, Ergocalciferol, (DRISDOL) 50000 UNITS CAPS TAKE ONE CAPSULE BY MOUTH ONCE A WEEK  5 capsule  prn    Current Medications (verified) Current Outpatient Prescriptions  Medication Sig Dispense Refill  . b complex vitamins tablet Take 1 tablet by mouth daily.        . bisoprolol-hydrochlorothiazide (ZIAC) 5-6.25 MG per tablet TAKE ONE TABLET BY MOUTH EVERY DAY  90 tablet  3    . Glucosamine-Chondroitin 250-200 MG TABS Take by mouth daily.        . hydrocortisone-pramoxine (ANALPRAM-HC) 2.5-1 % rectal cream Place rectally 2 (two) times daily.  30 g  3  . meloxicam (MOBIC) 15 MG tablet TAKE ONE TABLET BY MOUTH EVERY DAY AS NEEDED  30 tablet  0  . Multiple Minerals-Vitamins (CITRACAL PLUS) TABS Take by mouth daily.        . multivitamin (THERAGRAN) per tablet Take 1 tablet by mouth daily.        . Omega-3 Fatty Acids (FISH OIL) 1000 MG CAPS Take by mouth 2 (two) times daily.        . Vitamin D, Ergocalciferol, (DRISDOL) 50000 UNITS CAPS TAKE ONE CAPSULE BY MOUTH ONCE A WEEK  5 capsule  prn     Allergies (verified) Codeine and Flagyl   PAST HISTORY  Family History Family History  Problem Relation Age of Onset  . Colon cancer Mother   . Cancer Father   . Lung cancer Father   . Ovarian cancer Sister   . Breast cancer Sister   . Liver cancer Brother     Social History History  Substance Use Topics  . Smoking status: Never Smoker   . Smokeless tobacco: Not on file  . Alcohol Use: No  Are there smokers in your home (other than you)? No  Risk Factors Current exercise habits: The patient does not participate in regular exercise at present.  Dietary issues discussed: PROTEIN CALORIE INTAKE   Cardiac risk factors: advanced age (older than 30 for men, 67 for women), hypertension and sedentary lifestyle.  Depression Screen (Note: if answer to either of the following is "Yes", a more complete depression screening is indicated)   Over the past two weeks, have you felt down, depressed or hopeless? No  Over the past two weeks, have you felt little interest or pleasure in doing things? No  Have you lost interest or pleasure in daily life? No  Do you often feel hopeless? No  Do you cry easily over simple problems? No  Activities of Daily Living In your present state of health, do you have any difficulty performing the following activities?:  Driving?  No Managing money?  No Feeding yourself? No Getting from bed to chair? No Climbing a flight of stairs? No Preparing food and eating?: No Bathing or showering? No Getting dressed: No Getting to the toilet? No Using the toilet:No Moving around from place to place: No In the past year have you fallen or had a near fall?:No   Are you sexually active?  No  Do you have more than one partner?  No  Hearing Difficulties: No Do you often ask people to speak up or repeat themselves? No Do you experience ringing or noises in your ears? No Do you have difficulty understanding soft or whispered voices? No   Do you feel that you have a problem with memory? No  Do you often misplace items? No  Do you feel safe at home?  No  Cognitive Testing  Alert? Yes  Normal Appearance?Yes  Oriented to person? Yes  Place? Yes   Time? Yes  Recall of three objects?  Yes  Can perform simple calculations? Yes  Displays appropriate judgment?Yes  Can read the correct time from a watch face?Yes   Advanced Directives have been discussed with the patient? Yes  List the Names of Other Physician/Practitioners you currently use: 1.    Indicate any recent Medical Services you may have received from other than [facility] providers in the past year (date may be approximate).  Immunization History  Administered Date(s) Administered  . H1N1 10/02/2008  . Influenza Split 09/30/2011  . Influenza Whole 09/04/2007, 07/30/2008, 10/08/2009, 09/11/2010  . Pneumococcal Polysaccharide 12/04/2007  . Td 04/02/2008    Screening Tests Health Maintenance  Topic Date Due  . Zostavax  02/20/1994  . Influenza Vaccine  07/25/2012  . Tetanus/tdap  04/02/2018  . Colonoscopy  07/06/2020  . Pneumococcal Polysaccharide Vaccine Age 28 And Over  Completed    All answers were reviewed with the patient and necessary referrals were made:  Carrie Mew, MD   04/14/2012   History reviewed: allergies, current medications, past family  history, past medical history, past social history, past surgical history and problem list  Review of Systems A comprehensive review of systems was negative.    Objective:     Vision by Snellen chart: right UUV:[id number] declines measurement, left IHK:VQQVZDG declines measurement   SEES OPTHAMOLOGIST  Body mass index is 18.72 kg/(m^2). BP 124/70  Pulse 72  Temp 98.3 F (36.8 C)  Resp 16  Ht 5\' 6"  (1.676 m)  Wt 116 lb (52.617 kg)  BMI 18.72 kg/m2  BP 124/70  Pulse 72  Temp 98.3 F (36.8 C)  Resp 16  Ht 5\' 6"  (1.676 m)  Wt 116 lb (52.617 kg)  BMI 18.72 kg/m2  General Appearance:    Alert, cooperative, no distress, appears stated age  Head:    Normocephalic, without obvious abnormality, atraumatic  Eyes:    PERRL, conjunctiva/corneas clear, EOM's intact, fundi    benign, both eyes  Ears:    Normal TM's and external ear canals, both ears  Nose:   Nares normal, septum midline, mucosa normal, no drainage    or sinus tenderness  Throat:   Lips, mucosa, and tongue normal; teeth and gums normal  Neck:   Supple, symmetrical, trachea midline, no adenopathy;    thyroid:  no enlargement/tenderness/nodules; no carotid   bruit or JVD  Back:     Symmetric, no curvature, ROM normal, no CVA tenderness  Lungs:     Clear to auscultation bilaterally, respirations unlabored  Chest Wall:    No tenderness or deformity   Heart:    Regular rate and rhythm, S1 and S2 normal, no murmur, rub   or gallop  Breast Exam:    No tenderness, masses, or nipple abnormality  Abdomen:     Soft, non-tender, bowel sounds active all four quadrants,    no masses, no organomegaly  Genitalia:    Normal female without lesion, discharge or tenderness  Rectal:    Normal tone, normal prostate, no masses or tenderness;   guaiac negative stool  Extremities:   Extremities normal, atraumatic, no cyanosis or edema  Pulses:   2+ and symmetric all extremities  Skin:   Skin color, texture, turgor normal, no rashes or lesions   Lymph nodes:   Cervical, supraclavicular, and axillary nodes normal  Neurologic:   CNII-XII intact, normal strength, sensation and reflexes    throughout       Assessment:      This is a routine physical examination for this healthy  Female. Reviewed all health maintenance protocols including mammography colonoscopy bone density and reviewed appropriate screening labs. Her immunization history was reviewed as well as her current medications and allergies refills of her chronic medications were given and the plan for yearly health maintenance was discussed all orders and referrals were made as appropriate.      Plan:     During the course of the visit the patient was educated and counseled about appropriate screening and preventive services including:    Influenza vaccine  Td vaccine  Screening mammography  Bone densitometry screening  Diet review for nutrition referral? Yes ____  Not Indicated __X __   Patient Instructions (the written plan) was given to the patient.  Medicare Attestation I have personally reviewed: The patient's medical and social history Their use of alcohol, tobacco or illicit drugs Their current medications and supplements The patient's functional ability including ADLs,fall risks, home safety risks, cognitive, and hearing and visual impairment Diet and physical activities Evidence for depression or mood disorders  The patient's weight, height, BMI, and visual acuity have been recorded in the chart.  I have made referrals, counseling, and provided education to the patient based on review of the above and I have provided the patient with a written personalized care plan for preventive services.     Carrie Mew, MD   04/14/2012

## 2012-04-15 LAB — VITAMIN D 25 HYDROXY (VIT D DEFICIENCY, FRACTURES): Vit D, 25-Hydroxy: 60 ng/mL (ref 30–89)

## 2012-05-08 ENCOUNTER — Telehealth: Payer: Self-pay | Admitting: Internal Medicine

## 2012-05-08 NOTE — Telephone Encounter (Signed)
Pt requesting results of labs.

## 2012-05-08 NOTE — Telephone Encounter (Signed)
Pt informed

## 2012-07-04 ENCOUNTER — Other Ambulatory Visit: Payer: Self-pay | Admitting: *Deleted

## 2012-07-04 MED ORDER — CITRACAL PLUS PO TABS: 1.0000 | ORAL_TABLET | Freq: Every day | ORAL | Status: DC

## 2012-07-04 MED ORDER — FISH OIL 1000 MG PO CAPS: 2000.0000 mg | ORAL_CAPSULE | Freq: Two times a day (BID) | ORAL | Status: DC

## 2012-07-04 MED ORDER — GLUCOSAMINE-CHONDROITIN 250-200 MG PO TABS: 1.0000 | ORAL_TABLET | Freq: Every day | ORAL | Status: DC

## 2012-07-04 MED ORDER — VITAMIN D (ERGOCALCIFEROL) 1.25 MG (50000 UNIT) PO CAPS: 50000.0000 [IU] | ORAL_CAPSULE | ORAL | Status: DC

## 2012-08-23 DIAGNOSIS — N8184 Pelvic muscle wasting: Secondary | ICD-10-CM | POA: Diagnosis not present

## 2012-10-09 ENCOUNTER — Ambulatory Visit (INDEPENDENT_AMBULATORY_CARE_PROVIDER_SITE_OTHER): Payer: Medicare Other | Admitting: Internal Medicine

## 2012-10-09 VITALS — BP 130/80 | HR 72 | Temp 98.0°F | Resp 16 | Ht 65.0 in | Wt 114.0 lb

## 2012-10-09 DIAGNOSIS — I10 Essential (primary) hypertension: Secondary | ICD-10-CM

## 2012-10-09 DIAGNOSIS — Z23 Encounter for immunization: Secondary | ICD-10-CM | POA: Diagnosis not present

## 2012-10-09 DIAGNOSIS — M19049 Primary osteoarthritis, unspecified hand: Secondary | ICD-10-CM

## 2012-10-10 ENCOUNTER — Telehealth: Payer: Self-pay | Admitting: *Deleted

## 2012-10-10 DIAGNOSIS — Z23 Encounter for immunization: Secondary | ICD-10-CM

## 2012-10-10 MED ORDER — GLUCOSAMINE-CHONDROITIN 250-200 MG PO TABS: 1.0000 | ORAL_TABLET | Freq: Every day | ORAL | Status: DC

## 2012-10-10 MED ORDER — CITRACAL PLUS PO TABS: 1.0000 | ORAL_TABLET | Freq: Every day | ORAL | Status: DC

## 2012-10-10 MED ORDER — FISH OIL 1000 MG PO CAPS: 2000.0000 mg | ORAL_CAPSULE | Freq: Two times a day (BID) | ORAL | Status: DC

## 2012-10-10 MED ORDER — VITAMIN D (ERGOCALCIFEROL) 1.25 MG (50000 UNIT) PO CAPS: 50000.0000 [IU] | ORAL_CAPSULE | ORAL | Status: DC

## 2012-10-10 NOTE — Telephone Encounter (Signed)
Error

## 2012-10-13 ENCOUNTER — Ambulatory Visit: Payer: BC Managed Care – PPO | Admitting: Internal Medicine

## 2012-10-31 ENCOUNTER — Ambulatory Visit (INDEPENDENT_AMBULATORY_CARE_PROVIDER_SITE_OTHER): Payer: Medicare Other | Admitting: Family Medicine

## 2012-10-31 ENCOUNTER — Encounter: Payer: Self-pay | Admitting: Family Medicine

## 2012-10-31 VITALS — BP 132/70 | HR 68 | Temp 97.6°F | Wt 117.0 lb

## 2012-10-31 DIAGNOSIS — T148XXA Other injury of unspecified body region, initial encounter: Secondary | ICD-10-CM

## 2012-10-31 NOTE — Progress Notes (Signed)
Chief Complaint  Patient presents with  . Fall    HPI:  Acute visit for back pain: -started: s/p mechanical fall on carpet 5 days ago -symptoms: bruising and pain buttocks and low back area and R lateral leg - got up and walked right after incident -worse with/better with: worse in the morning when she first get up in morning, better when she" works it out" and with ibuprofen -denies: weakness, numbness, fevers, bowel or bladder incontinence or radiation of pain to groin   ROS: See pertinent positives and negatives per HPI.  Past Medical History  Diagnosis Date  . Diverticulosis   . Osteoporosis   . Hypertension   . Irritable bowel syndrome   . UTI (lower urinary tract infection)     Family History  Problem Relation Age of Onset  . Colon cancer Mother   . Cancer Father   . Lung cancer Father   . Ovarian cancer Sister   . Breast cancer Sister   . Liver cancer Brother     History   Social History  . Marital Status: Married    Spouse Name: N/A    Number of Children: N/A  . Years of Education: N/A   Occupational History  . retired    Social History Main Topics  . Smoking status: Never Smoker   . Smokeless tobacco: None  . Alcohol Use: No  . Drug Use: No  . Sexually Active: Not Currently   Other Topics Concern  . None   Social History Narrative  . None    Current outpatient prescriptions:b complex vitamins tablet, Take 1 tablet by mouth daily.  , Disp: , Rfl: ;  bisoprolol-hydrochlorothiazide (ZIAC) 5-6.25 MG per tablet, TAKE ONE TABLET BY MOUTH EVERY DAY, Disp: 90 tablet, Rfl: 3;  Glucosamine-Chondroitin 250-200 MG TABS, Take 1 tablet by mouth daily., Disp: 90 each, Rfl: 3;  hydrocortisone-pramoxine (ANALPRAM-HC) 2.5-1 % rectal cream, Place rectally 2 (two) times daily., Disp: 30 g, Rfl: 3 magnesium oxide (MAG-OX 400) 400 MG tablet, Take 1 tablet (400 mg total) by mouth daily., Disp: , Rfl: ;  meloxicam (MOBIC) 15 MG tablet, TAKE ONE TABLET BY MOUTH EVERY DAY  AS NEEDED, Disp: 30 tablet, Rfl: 0;  Multiple Minerals-Vitamins (CITRACAL PLUS) TABS, Take 1 tablet by mouth daily., Disp: 90 tablet, Rfl: 3;  Omega-3 Fatty Acids (FISH OIL) 1000 MG CAPS, Take 2 capsules (2,000 mg total) by mouth 2 (two) times daily., Disp: 120 capsule, Rfl: 3 Vitamin D, Ergocalciferol, (DRISDOL) 50000 UNITS CAPS, Take 1 capsule (50,000 Units total) by mouth every 7 (seven) days., Disp: 5 capsule, Rfl: prn  EXAM:  Filed Vitals:   10/31/12 1004  BP: 132/70  Pulse: 68  Temp: 97.6 F (36.4 C)    There is no height on file to calculate BMI.  GENERAL: vitals reviewed and listed above, alert, oriented, appears well hydrated and in no acute distress  HEENT: atraumatic, conjunttiva clear, no obvious abnormalities on inspection of external nose and ears  MS/NEURO: moves all extremities without noticeable abnormality -gait normal -minor small bruise R buttock lateral to gluteal cleft, small bruis R lateral leg above knee -no bony lumbar spine, sacral, ischial or femoral TTP - minor TTP of soft tissues of R buttock -normal DTRs and sensation LEs bilaterally, neg SLRT and CLRT, neg FABER and FADIR and ASIA compression  PSYCH: pleasant and cooperative, no obvious depression or anxiety  ASSESSMENT AND PLAN:  Discussed the following assessment and plan:  1. Contusion    -  with no bony TTP fx unlikely. Suspect soft tissue contusion.Offered plain films to exclude versus observation with follow up if not improving as expected. Pt prefers observation. Supportive care and ice. Follow up per below. -Patient advised to return or notify a doctor immediately if symptoms worsen or persist or new concerns arise.  Patient Instructions  -can take tylenol 650 -1000mg  up to 3 times daily or ibuprofen 400-600mg  up to 3 times daily  -ice on brusing for 5-10 minute 1-2 times daily  -follow up with your doctor in 2 weeks if not resolving or sooner if worsening pain or other  concerns     [name] R.

## 2012-10-31 NOTE — Patient Instructions (Signed)
-  can take tylenol 650 -1000mg  up to 3 times daily or ibuprofen 400-600mg  up to 3 times daily  -ice on brusing for 5-10 minute 1-2 times daily  -follow up with your doctor in 2 weeks if not resolving or sooner if worsening pain or other concerns

## 2012-11-05 ENCOUNTER — Encounter: Payer: Self-pay | Admitting: Internal Medicine

## 2012-11-05 NOTE — Progress Notes (Signed)
Subjective:    Patient ID: [name], female    DOB: [birth year], 79 y.o.   MRN: [id number]  HPI  Present for monitoring of HTN with acute complaint of finger swelling without deformity   Review of Systems  Constitutional: Negative for activity change, appetite change and fatigue.  HENT: Negative for ear pain, congestion, neck pain, postnasal drip and sinus pressure.   Eyes: Negative for redness and visual disturbance.  Respiratory: Negative for cough, shortness of breath and wheezing.   Gastrointestinal: Negative for abdominal pain and abdominal distention.  Genitourinary: Negative for dysuria, frequency and menstrual problem.  Musculoskeletal: Positive for joint swelling and arthralgias. Negative for myalgias.  Skin: Negative for rash and wound.  Neurological: Negative for dizziness, weakness and headaches.  Hematological: Negative for adenopathy. Does not bruise/bleed easily.  Psychiatric/Behavioral: Negative for sleep disturbance and decreased concentration.   Past Medical History  Diagnosis Date  . Diverticulosis   . Osteoporosis   . Hypertension   . Irritable bowel syndrome   . UTI (lower urinary tract infection)     History   Social History  . Marital Status: Married    Spouse Name: N/A    Number of Children: N/A  . Years of Education: N/A   Occupational History  . retired    Social History Main Topics  . Smoking status: Never Smoker   . Smokeless tobacco: Not on file  . Alcohol Use: No  . Drug Use: No  . Sexually Active: Not Currently   Other Topics Concern  . Not on file   Social History Narrative  . No narrative on file    Past Surgical History  Procedure Date  . Lumbar laminectomy   . Lumbar fusion   . Partial resection of colon for tumor   . Colon polyps   . Total hip arthroplasty     right  . Appendectomy     Family History  Problem Relation Age of Onset  . Colon cancer Mother   . Cancer Father   . Lung cancer Father   .  Ovarian cancer Sister   . Breast cancer Sister   . Liver cancer Brother     Allergies  Allergen Reactions  . Codeine     REACTION: headaches  . Flagyl (Metronidazole Hcl)     nausea    Current Outpatient Prescriptions on File Prior to Visit  Medication Sig Dispense Refill  . b complex vitamins tablet Take 1 tablet by mouth daily.        . bisoprolol-hydrochlorothiazide (ZIAC) 5-6.25 MG per tablet TAKE ONE TABLET BY MOUTH EVERY DAY  90 tablet  3  . hydrocortisone-pramoxine (ANALPRAM-HC) 2.5-1 % rectal cream Place rectally 2 (two) times daily.  30 g  3  . magnesium oxide (MAG-OX 400) 400 MG tablet Take 1 tablet (400 mg total) by mouth daily.      . meloxicam (MOBIC) 15 MG tablet TAKE ONE TABLET BY MOUTH EVERY DAY AS NEEDED  30 tablet  0    BP 130/80  Pulse 72  Temp 98 F (36.7 C)  Resp 16  Ht 5\' 5"  (1.651 m)  Wt 114 lb (51.71 kg)  BMI 18.97 kg/m2       Objective:   Physical Exam  Constitutional: She is oriented to person, place, and time. She appears well-developed and well-nourished. No distress.  HENT:  Head: Normocephalic and atraumatic.  Right Ear: External ear normal.  Left Ear: External ear normal.  Nose: Nose normal.  Mouth/Throat: Oropharynx is clear and moist.  Eyes: Conjunctivae normal and EOM are normal. Pupils are equal, round, and reactive to light.  Neck: Normal range of motion. Neck supple. No JVD present. No tracheal deviation present. No thyromegaly present.  Cardiovascular: Normal rate and regular rhythm.   Murmur heard. Pulmonary/Chest: Effort normal and breath sounds normal. She has no wheezes. She exhibits no tenderness.  Abdominal: Soft. Bowel sounds are normal.  Musculoskeletal: Normal range of motion. She exhibits edema and tenderness.  Lymphadenopathy:    She has no cervical adenopathy.  Neurological: She is alert and oriented to person, place, and time. She has normal reflexes. No cranial nerve deficit.  Skin: Skin is warm and dry. She is  not diaphoretic.  Psychiatric: She has a normal mood and affect. Her behavior is normal.          Assessment & Plan:  Stable HTN OA of hands ( less likely RA) Trial of NSAID therapy with celebrex ( samples given)

## 2012-11-06 ENCOUNTER — Telehealth: Payer: Self-pay | Admitting: Internal Medicine

## 2012-11-06 DIAGNOSIS — M549 Dorsalgia, unspecified: Secondary | ICD-10-CM

## 2012-11-06 NOTE — Telephone Encounter (Signed)
Per dr Lovell Sheehan- needs to go to orthopedist-pt informed and referral sent

## 2012-11-06 NOTE — Telephone Encounter (Signed)
Patient Information:  Caller Name: Raahi  Phone: [phone]  Patient: [name]  Gender: Female  DOB: [birth year]  Age: 79 Years  PCP: Darryll Capers (Adults only)  Office Follow Up:  Does the office need to follow up with this patient?: Yes  Instructions For The Office: Pt is still having pain in her back and per triage should be seen in next 3 days.  Pt only wants to see Dr Lovell Sheehan.  Office could you see if patient can be worked into his schedule for Wed or Fri?   Symptoms  Reason For Call & Symptoms: pt reports she fell on 10/26/12.  Pt was seen in the office on 10/31/12.  Pt states that she was told it bruised but pain is getting worse everyday.  Pt reports pain has become worse 11/04/12 .Pt states it is hurts to go up and down stairs. Pt rates pain increases when she is up and she states laying down hurts her also  Reviewed Health History In EMR: Yes  Reviewed Medications In EMR: Yes  Reviewed Allergies In EMR: Yes  Reviewed Surgeries / Procedures: Yes  Date of Onset of Symptoms: 10/26/2012  Treatments Tried: ibuprofen, tylenol  Treatments Tried Worked: No  Guideline(s) Used:  Back Injury  Disposition Per Guideline:   See Within 3 Days in Office  Reason For Disposition Reached:   Injury and pain has not improved after 3 days  Advice Given:  Apply Heat to the Area:  Beginning 48 hours after an injury, apply a warm washcloth or heating pad for 10 minutes three times a day.  Apply a Cold Pack:  Apply a cold pack or an ice bag (wrapped in a moist towel) to the area for 20 minutes. Repeat in 1 hour, then every 4 hours while awake.  Avoid:  Avoid heavy lifting and any sports activities for the first week after an injury.  Call Back If:  You become worse.  Patient Refused Recommendation:  Patient Refused Appt, Patient Requests Appt At Later Date  pt wants to wait and see Dr Jonny Ruiz next week

## 2012-11-14 DIAGNOSIS — M545 Low back pain, unspecified: Secondary | ICD-10-CM | POA: Diagnosis not present

## 2012-11-14 DIAGNOSIS — M25559 Pain in unspecified hip: Secondary | ICD-10-CM | POA: Diagnosis not present

## 2012-12-18 ENCOUNTER — Telehealth: Payer: Self-pay | Admitting: Internal Medicine

## 2012-12-18 NOTE — Telephone Encounter (Signed)
Patient Information:  Caller Name: [name]  Phone: [phone]  Patient: [name], [name]  Gender: Female  DOB: 05-16-1932  Age: 79 Years  PCP: [name] (Adults only)  Office Follow Up:  Does the office need to follow up with this patient?: Yes  Instructions For The Office: Patient needs appointment for cortisone injection in her Right hand.  Spoke with [facility] who is going to schedule an appointment.  RN Note:  Patient verbalized understanding of care advice.  Symptoms  Reason For Call & Symptoms: Call regarding needing a shot for her arthritis in Right hand,  Pills have been tried and are not working.  Reviewed Health History In EMR: Yes  Reviewed Medications In EMR: Yes  Reviewed Allergies In EMR: Yes  Reviewed Surgeries / Procedures: Yes  Date of Onset of Symptoms: 10/24/2012  Guideline(s) Used:  Finger Pain  Disposition Per Guideline:   See Within 3 Days in Office  Reason For Disposition Reached:   Moderate pain (e.g., interferes with normal activities) and present > 3 days  Advice Given:  Reassurance:  The symptoms you describe do not sound serious.  You have told me that there is no joint swelling or fever. You told me that there are no signs of infection, like redness or pus. You also told me that there has been no recent major injury.  Here is some care advice that should help.  Call Back If:  You become worse.  Spoke with [facility] in the office who is goiing to help with an appointment

## 2012-12-18 NOTE — Telephone Encounter (Signed)
Scheduler is contacting pt re appointment. Encounter closed.

## 2012-12-27 DIAGNOSIS — Z85828 Personal history of other malignant neoplasm of skin: Secondary | ICD-10-CM | POA: Diagnosis not present

## 2012-12-27 DIAGNOSIS — L821 Other seborrheic keratosis: Secondary | ICD-10-CM | POA: Diagnosis not present

## 2012-12-27 DIAGNOSIS — D239 Other benign neoplasm of skin, unspecified: Secondary | ICD-10-CM | POA: Diagnosis not present

## 2013-01-01 ENCOUNTER — Encounter: Payer: Self-pay | Admitting: Internal Medicine

## 2013-01-01 ENCOUNTER — Ambulatory Visit (INDEPENDENT_AMBULATORY_CARE_PROVIDER_SITE_OTHER): Payer: Medicare Other | Admitting: Internal Medicine

## 2013-01-01 VITALS — BP 130/70 | HR 64 | Temp 97.8°F | Wt 117.0 lb

## 2013-01-01 DIAGNOSIS — M19049 Primary osteoarthritis, unspecified hand: Secondary | ICD-10-CM | POA: Diagnosis not present

## 2013-01-01 DIAGNOSIS — M1990 Unspecified osteoarthritis, unspecified site: Secondary | ICD-10-CM | POA: Diagnosis not present

## 2013-01-01 DIAGNOSIS — I10 Essential (primary) hypertension: Secondary | ICD-10-CM

## 2013-01-01 DIAGNOSIS — M19042 Primary osteoarthritis, left hand: Secondary | ICD-10-CM

## 2013-01-01 MED ORDER — METHYLPREDNISOLONE ACETATE 40 MG/ML IJ SUSP: 10.0000 mg | Freq: Once | INTRAMUSCULAR | Status: DC

## 2013-01-01 NOTE — Patient Instructions (Signed)
You have received a steroid injection into a joint space. It will take up to 48 hours before you notice a difference in the pain in the joint. For the next few hours keep ice on the site of the injection. Do not exert the injected joint for the next 24 hours.  

## 2013-01-01 NOTE — Progress Notes (Signed)
  Subjective:    Patient ID: [name], female    DOB: 11/19/1931, 79 y.o.   MRN: [id number]  HPI  Patient is 79 year old female with degenerative joint disease who has significant arthritis in her hands at the time of her last office visit we discussed injection therapy.    Review of Systems  Respiratory: Negative.   Cardiovascular: Negative.   Musculoskeletal: Positive for myalgias, joint swelling and arthralgias.       Objective:   Physical Exam  Cardiovascular: Normal rate and regular rhythm.   Pulmonary/Chest: Effort normal and breath sounds normal.  Abdominal: Soft. Bowel sounds are normal.  Musculoskeletal: She exhibits edema and tenderness.  Swollen fourth MCP digit on the left hand          Assessment & Plan:  Injection of MCP on left hand for severe osteoarthritis  Informed consent obtained and the patient's Left fourth digit MCP was prepped with betadine. Local anesthesia was obtained with topical spray. Then 10 mg of Depo-Medrol and 1/10 cc of lidocaine was injected into the joint space. The patient tolerated the procedure without complications. Post injection care discussed with patient.

## 2013-03-16 ENCOUNTER — Other Ambulatory Visit: Payer: Self-pay | Admitting: Internal Medicine

## 2013-05-21 ENCOUNTER — Encounter: Payer: Self-pay | Admitting: Internal Medicine

## 2013-05-21 ENCOUNTER — Ambulatory Visit (INDEPENDENT_AMBULATORY_CARE_PROVIDER_SITE_OTHER): Payer: Medicare Other | Admitting: Internal Medicine

## 2013-05-21 VITALS — BP 136/80 | HR 72 | Temp 98.2°F | Resp 16 | Ht 65.0 in | Wt 116.0 lb

## 2013-05-21 DIAGNOSIS — I10 Essential (primary) hypertension: Secondary | ICD-10-CM

## 2013-05-21 DIAGNOSIS — Z862 Personal history of diseases of the blood and blood-forming organs and certain disorders involving the immune mechanism: Secondary | ICD-10-CM | POA: Diagnosis not present

## 2013-05-21 DIAGNOSIS — T887XXA Unspecified adverse effect of drug or medicament, initial encounter: Secondary | ICD-10-CM

## 2013-05-21 DIAGNOSIS — E785 Hyperlipidemia, unspecified: Secondary | ICD-10-CM | POA: Diagnosis not present

## 2013-05-21 DIAGNOSIS — Z0000 Encounter for general adult medical examination without abnormal findings: Secondary | ICD-10-CM

## 2013-05-21 LAB — BASIC METABOLIC PANEL
BUN: 18 mg/dL (ref 6–23)
CO2: 27 mEq/L (ref 19–32)
Calcium: 9.3 mg/dL (ref 8.4–10.5)
Chloride: 105 mEq/L (ref 96–112)
Creatinine, Ser: 0.7 mg/dL (ref 0.4–1.2)
GFR: 85.74 mL/min (ref >60.00)
Glucose, Bld: 93 mg/dL (ref 70–99)
Potassium: 4.1 mEq/L (ref 3.5–5.1)
Sodium: 139 mEq/L (ref 135–145)

## 2013-05-21 LAB — CBC WITH DIFFERENTIAL/PLATELET
Basophils Absolute: 0 10*3/uL (ref 0.0–0.1)
Basophils Relative: 0.5 % (ref 0.0–3.0)
Eosinophils Absolute: 0.1 10*3/uL (ref 0.0–0.7)
Eosinophils Relative: 1.2 % (ref 0.0–5.0)
HCT: 35.7 % — ABNORMAL LOW (ref 36.0–46.0)
Hemoglobin: 12 g/dL (ref 12.0–15.0)
Lymphocytes Relative: 31 % (ref 12.0–46.0)
Lymphs Abs: 3.1 10*3/uL (ref 0.7–4.0)
MCHC: 33.7 g/dL (ref 30.0–36.0)
MCV: 93.3 fl (ref 78.0–100.0)
Monocytes Absolute: 0.6 10*3/uL (ref 0.1–1.0)
Monocytes Relative: 5.6 % (ref 3.0–12.0)
Neutro Abs: 6.2 10*3/uL (ref 1.4–7.7)
Neutrophils Relative %: 61.7 % (ref 43.0–77.0)
Platelets: 171 10*3/uL (ref 150.0–400.0)
RBC: 3.82 Mil/uL — ABNORMAL LOW (ref 3.87–5.11)
RDW: 12.7 % (ref 11.5–14.6)
WBC: 10 10*3/uL (ref 4.5–10.5)

## 2013-05-21 LAB — LIPID PANEL
Cholesterol: 205 mg/dL — ABNORMAL HIGH (ref 0–200)
HDL: 58.4 mg/dL (ref >39.00)
Total CHOL/HDL Ratio: 4
Triglycerides: 95 mg/dL (ref 0.0–149.0)
VLDL: 19 mg/dL (ref 0.0–40.0)

## 2013-05-21 LAB — LDL CHOLESTEROL, DIRECT: Direct LDL: 125.1 mg/dL

## 2013-05-21 LAB — HEPATIC FUNCTION PANEL
ALT: 14 U/L (ref 0–35)
AST: 22 U/L (ref 0–37)
Albumin: 4.4 g/dL (ref 3.5–5.2)
Alkaline Phosphatase: 48 U/L (ref 39–117)
Bilirubin, Direct: 0 mg/dL (ref 0.0–0.3)
Total Bilirubin: 0.8 mg/dL (ref 0.3–1.2)
Total Protein: 7.6 g/dL (ref 6.0–8.3)

## 2013-05-21 LAB — TSH: TSH: 1.71 u[IU]/mL (ref 0.35–5.50)

## 2013-05-21 NOTE — Progress Notes (Signed)
Subjective:    Patient ID: [name], female    DOB: [birth year], 79 y.o.   MRN: [id number]  HPIpatient presents for her annual Medicare wellness examination she has a history of anemia and history of diverticulosis history of hypertension and hyperlipidemia.  Her blood pressure is stable today on her current medications we'll measure a lipid and a liver to assess her lipid control on her current medications and regimen she states that her diverticulosis is stable she has a history of anemia for which a CBC and iron level will be drawn today.  We reviewed her medications reviewed necessary health maintenance and she also presents today for Medicare wellness exam  shingles vaccination    Review of Systems  Constitutional: Negative for activity change, appetite change and fatigue.  HENT: Negative for ear pain, congestion, neck pain, postnasal drip and sinus pressure.   Eyes: Negative for redness and visual disturbance.  Respiratory: Negative for cough, shortness of breath and wheezing.   Gastrointestinal: Negative for abdominal pain and abdominal distention.  Genitourinary: Negative for dysuria, frequency and menstrual problem.  Musculoskeletal: Negative for myalgias, joint swelling and arthralgias.  Skin: Negative for rash and wound.  Neurological: Negative for dizziness, weakness and headaches.  Hematological: Negative for adenopathy. Does not bruise/bleed easily.  Psychiatric/Behavioral: Negative for sleep disturbance and decreased concentration.   Past Medical History  Diagnosis Date  . Diverticulosis   . Osteoporosis   . Hypertension   . Irritable bowel syndrome   . UTI (lower urinary tract infection)     History   Social History  . Marital Status: Married    Spouse Name: N/A    Number of Children: N/A  . Years of Education: N/A   Occupational History  . retired    Social History Main Topics  . Smoking status: Never Smoker   . Smokeless tobacco: Not on  file  . Alcohol Use: No  . Drug Use: No  . Sexually Active: Not Currently   Other Topics Concern  . Not on file   Social History Narrative  . No narrative on file    Past Surgical History  Procedure Laterality Date  . Lumbar laminectomy    . Lumbar fusion    . Partial resection of colon for tumor    . Colon polyps    . Total hip arthroplasty      right  . Appendectomy      Family History  Problem Relation Age of Onset  . Colon cancer Mother   . Cancer Father   . Lung cancer Father   . Ovarian cancer Sister   . Breast cancer Sister   . Liver cancer Brother     Allergies  Allergen Reactions  . Codeine     REACTION: headaches  . Flagyl (Metronidazole Hcl)     nausea    Current Outpatient Prescriptions on File Prior to Visit  Medication Sig Dispense Refill  . b complex vitamins tablet Take 1 tablet by mouth daily.        . bisoprolol-hydrochlorothiazide (ZIAC) 5-6.25 MG per tablet TAKE ONE TABLET BY MOUTH EVERY DAY  90 tablet  0  . Glucosamine-Chondroitin 250-200 MG TABS Take 1 tablet by mouth daily.  90 each  3  . hydrocortisone-pramoxine (ANALPRAM-HC) 2.5-1 % rectal cream Place rectally 2 (two) times daily.  30 g  3  . meloxicam (MOBIC) 15 MG tablet TAKE ONE TABLET BY MOUTH EVERY DAY AS NEEDED  30 tablet  0  .  Multiple Minerals-Vitamins (CITRACAL PLUS) TABS Take 1 tablet by mouth daily.  90 tablet  3  . Omega-3 Fatty Acids (FISH OIL) 1000 MG CAPS Take 2 capsules (2,000 mg total) by mouth 2 (two) times daily.  120 capsule  3  . Vitamin D, Ergocalciferol, (DRISDOL) 50000 UNITS CAPS TAKE ONE CAPSULE BY MOUTH ONCE A WEEK  5 capsule  6   No current facility-administered medications on file prior to visit.    BP 136/80  Pulse 72  Temp(Src) 98.2 F (36.8 C)  Resp 16  Ht 5\' 5"  (1.651 m)  Wt 116 lb (52.617 kg)  BMI 19.3 kg/m2       Objective:   Physical Exam  Nursing note and vitals reviewed. Constitutional: She is oriented to person, place, and time. She  appears well-developed and well-nourished. No distress.  thin  HENT:  Head: Normocephalic and atraumatic.  Eyes: Conjunctivae and EOM are normal. Pupils are equal, round, and reactive to light.  Neck: Normal range of motion. Neck supple. No JVD present. No tracheal deviation present. No thyromegaly present.  Cardiovascular: Normal rate and regular rhythm.   Murmur heard. Pulmonary/Chest: Effort normal and breath sounds normal. She has no wheezes. She exhibits no tenderness.  Abdominal: Soft. Bowel sounds are normal.  Musculoskeletal: Normal range of motion. She exhibits no edema and no tenderness.  Lymphadenopathy:    She has no cervical adenopathy.  Neurological: She is alert and oriented to person, place, and time. She has normal reflexes. No cranial nerve deficit.  Skin: Skin is warm and dry. She is not diaphoretic.  Psychiatric: She has a normal mood and affect. Her behavior is normal.          Assessment & Plan:  Stable hypertension on current medications monitor her lipid and liver today as well as a basic metabolic panel to look at renal function on hypertensive drugs.  Stable irritable bowel with a history of diverticulosis.  History of anemia for which she will be screened with a CBC Subjective:    [name] is a 79 y.o. female who presents for Medicare Annual/Subsequent preventive examination.  Preventive Screening-Counseling & Management  Tobacco History  Smoking status  . Never Smoker   Smokeless tobacco  . Not on file     Problems Prior to Visit 1.   Current Problems (verified) Patient Active Problem List   Diagnosis Date Noted  . ACUTE FRONTAL SINUSITIS 09/28/2010  . RECTAL BLEEDING 06/16/2010  . ANEMIA 06/12/2010  . COLONIC POLYPS, ADENOMATOUS, BENIGN 06/12/2010  . URETHRAL CARUNCLE 12/02/2009  . HYPERLIPIDEMIA, MILD 10/02/2008  . HAIR LOSS 10/02/2008  . INTERNAL HEMORRHOIDS WITH OTHER COMPLICATION 04/02/2008  . CHEST PAIN, ATYPICAL  12/04/2007  . HYPERTENSION 05/30/2007  . DIVERTICULOSIS, COLON 05/30/2007  . IRRITABLE BOWEL SYNDROME 05/30/2007  . DEGENERATIVE JOINT DISEASE, MILD 05/30/2007  . OSTEOPOROSIS 05/30/2007    Medications Prior to Visit Current Outpatient Prescriptions on File Prior to Visit  Medication Sig Dispense Refill  . b complex vitamins tablet Take 1 tablet by mouth daily.        . bisoprolol-hydrochlorothiazide (ZIAC) 5-6.25 MG per tablet TAKE ONE TABLET BY MOUTH EVERY DAY  90 tablet  0  . Glucosamine-Chondroitin 250-200 MG TABS Take 1 tablet by mouth daily.  90 each  3  . hydrocortisone-pramoxine (ANALPRAM-HC) 2.5-1 % rectal cream Place rectally 2 (two) times daily.  30 g  3  . meloxicam (MOBIC) 15 MG tablet TAKE ONE TABLET BY MOUTH EVERY DAY AS NEEDED  30 tablet  0  . Multiple Minerals-Vitamins (CITRACAL PLUS) TABS Take 1 tablet by mouth daily.  90 tablet  3  . Omega-3 Fatty Acids (FISH OIL) 1000 MG CAPS Take 2 capsules (2,000 mg total) by mouth 2 (two) times daily.  120 capsule  3  . Vitamin D, Ergocalciferol, (DRISDOL) 50000 UNITS CAPS TAKE ONE CAPSULE BY MOUTH ONCE A WEEK  5 capsule  6   No current facility-administered medications on file prior to visit.    Current Medications (verified) Current Outpatient Prescriptions  Medication Sig Dispense Refill  . b complex vitamins tablet Take 1 tablet by mouth daily.        . bisoprolol-hydrochlorothiazide (ZIAC) 5-6.25 MG per tablet TAKE ONE TABLET BY MOUTH EVERY DAY  90 tablet  0  . Glucosamine-Chondroitin 250-200 MG TABS Take 1 tablet by mouth daily.  90 each  3  . hydrocortisone-pramoxine (ANALPRAM-HC) 2.5-1 % rectal cream Place rectally 2 (two) times daily.  30 g  3  . meloxicam (MOBIC) 15 MG tablet TAKE ONE TABLET BY MOUTH EVERY DAY AS NEEDED  30 tablet  0  . Multiple Minerals-Vitamins (CITRACAL PLUS) TABS Take 1 tablet by mouth daily.  90 tablet  3  . Omega-3 Fatty Acids (FISH OIL) 1000 MG CAPS Take 2 capsules (2,000 mg total) by mouth 2  (two) times daily.  120 capsule  3  . Vitamin D, Ergocalciferol, (DRISDOL) 50000 UNITS CAPS TAKE ONE CAPSULE BY MOUTH ONCE A WEEK  5 capsule  6   No current facility-administered medications for this visit.     Allergies (verified) Codeine and Flagyl   PAST HISTORY  Family History Family History  Problem Relation Age of Onset  . Colon cancer Mother   . Cancer Father   . Lung cancer Father   . Ovarian cancer Sister   . Breast cancer Sister   . Liver cancer Brother     Social History History  Substance Use Topics  . Smoking status: Never Smoker   . Smokeless tobacco: Not on file  . Alcohol Use: No     Are there smokers in your home (other than you)? No  Risk Factors Current exercise habits: Patient walks on a regular basis  Dietary issues discussed: patient is to increase calories in diet   Cardiac risk factors: dyslipidemia and hypertension.  Depression Screen (Note: if answer to either of the following is "Yes", a more complete depression screening is indicated)   Over the past two weeks, have you felt down, depressed or hopeless? No  Over the past two weeks, have you felt little interest or pleasure in doing things? No  Have you lost interest or pleasure in daily life? No  Do you often feel hopeless? No  Do you cry easily over simple problems? No  Activities of Daily Living In your present state of health, do you have any difficulty performing the following activities?:  Driving? No Managing money?  No Feeding yourself? No Getting from bed to chair? No Climbing a flight of stairs? No Preparing food and eating?: No Bathing or showering? No Getting dressed: No Getting to the toilet? No Using the toilet:No Moving around from place to place: No In the past year have you fallen or had a near fall?:No   Are you sexually active?  No  Do you have more than one partner?  No  Hearing Difficulties: No Do you often ask people to speak up or repeat themselves?  No Do you experience ringing  or noises in your ears? No Do you have difficulty understanding soft or whispered voices? No   Do you feel that you have a problem with memory? No  Do you often misplace items? No  Do you feel safe at home?  Yes  Cognitive Testing  Alert? Yes  Normal Appearance?Yes  Oriented to person? Yes  Place? Yes   Time? Yes  Recall of three objects?  Yes  Can perform simple calculations? Yes  Displays appropriate judgment?Yes  Can read the correct time from a watch face?Yes   Advanced Directives have been discussed with the patient? Yes  List the Names of Other Physician/Practitioners you currently use: 1.    Indicate any recent Medical Services you may have received from other than [facility] providers in the past year (date may be approximate).  Immunization History  Administered Date(s) Administered  . H1N1 10/02/2008  . Influenza Split 09/30/2011, 10/10/2012  . Influenza Whole 09/04/2007, 07/30/2008, 10/08/2009, 09/11/2010  . Pneumococcal Polysaccharide 12/04/2007  . Td 04/02/2008    Screening Tests Health Maintenance  Topic Date Due  . Zostavax  02/20/1994  . Influenza Vaccine  06/25/2013  . Tetanus/tdap  04/02/2018  . Colonoscopy  07/06/2020  . Pneumococcal Polysaccharide Vaccine Age 2 And Over  Completed    All answers were reviewed with the patient and necessary referrals were made:  Carrie Mew, MD   05/21/2013   History reviewed: allergies, current medications, past family history, past medical history, past social history, past surgical history and problem list  Review of Systems Pertinent items are noted in HPI.    Objective:     Vision by Snellen chart: right eye:20/20, left eye:20/20  Body mass index is 19.3 kg/(m^2). BP 136/80  Pulse 72  Temp(Src) 98.2 F (36.8 C)  Resp 16  Ht 5\' 5"  (1.651 m)  Wt 116 lb (52.617 kg)  BMI 19.3 kg/m2  BP 136/80  Pulse 72  Temp(Src) 98.2 F (36.8 C)  Resp 16  Ht 5\' 5"  (1.651 m)   Wt 116 lb (52.617 kg)  BMI 19.3 kg/m2  General Appearance:    Alert, cooperative, no distress, appears stated age  Head:    Normocephalic, without obvious abnormality, atraumatic  Eyes:    PERRL, conjunctiva/corneas clear, EOM's intact, fundi    benign, both eyes  Ears:    Normal TM's and external ear canals, both ears  Nose:   Nares normal, septum midline, mucosa normal, no drainage    or sinus tenderness  Throat:   Lips, mucosa, and tongue normal; teeth and gums normal  Neck:   Supple, symmetrical, trachea midline, no adenopathy;    thyroid:  no enlargement/tenderness/nodules; no carotid   bruit or JVD  Back:     Symmetric, no curvature, ROM normal, no CVA tenderness  Lungs:     Clear to auscultation bilaterally, respirations unlabored  Chest Wall:    No tenderness or deformity   Heart:    Regular rate and rhythm, S1 and S2 normal, no murmur, rub   or gallop  Breast Exam:    No tenderness, masses, or nipple abnormality  Abdomen:     Soft, non-tender, bowel sounds active all four quadrants,    no masses, no organomegaly  Genitalia:    Normal female without lesion, discharge or tenderness  Rectal:    Normal tone, normal prostate, no masses or tenderness;   guaiac negative stool  Extremities:   Extremities normal, atraumatic, no cyanosis or edema  Pulses:   2+  and symmetric all extremities  Skin:   Skin color, texture, turgor normal, no rashes or lesions  Lymph nodes:   Cervical, supraclavicular, and axillary nodes normal  Neurologic:   CNII-XII intact, normal strength, sensation and reflexes    throughout       Assessment:      This is a routine physical examination for this healthy  Female. Reviewed all health maintenance protocols including mammography colonoscopy bone density and reviewed appropriate screening labs. Her immunization history was reviewed as well as her current medications and allergies refills of her chronic medications were given and the plan for yearly health  maintenance was discussed all orders and referrals were made as appropriate.      Plan:     During the course of the visit the patient was educated and counseled about appropriate screening and preventive services including:    Shingles vaccine  Diet review for nutrition referral? Yes ____  Not Indicated x   Patient Instructions (the written plan) was given to the patient.  Medicare Attestation I have personally reviewed: The patient's medical and social history Their use of alcohol, tobacco or illicit drugs Their current medications and supplements The patient's functional ability including ADLs,fall risks, home safety risks, cognitive, and hearing and visual impairment Diet and physical activities Evidence for depression or mood disorders  The patient's weight, height, BMI, and visual acuity have been recorded in the chart.  I have made referrals, counseling, and provided education to the patient based on review of the above and I have provided the patient with a written personalized care plan for preventive services.     Carrie Mew, MD   05/21/2013

## 2013-05-21 NOTE — Patient Instructions (Addendum)
The patient is instructed to continue all medications as prescribed. Schedule followup with check out clerk upon leaving the clinic  

## 2013-06-19 ENCOUNTER — Ambulatory Visit (INDEPENDENT_AMBULATORY_CARE_PROVIDER_SITE_OTHER): Payer: Medicare Other | Admitting: Family

## 2013-06-19 ENCOUNTER — Encounter: Payer: Self-pay | Admitting: Family

## 2013-06-19 VITALS — BP 118/64 | HR 58 | Temp 97.9°F | Wt 117.0 lb

## 2013-06-19 DIAGNOSIS — J029 Acute pharyngitis, unspecified: Secondary | ICD-10-CM

## 2013-06-19 NOTE — Progress Notes (Signed)
Subjective:    Patient ID: [name], female    DOB: 1931/10/26, 79 y.o.   MRN: [id number]  HPI 79 year old female is in today with c/o a "red throat." She reports the dentist told her her throat was red. She denies any pain. Reports she has had some ceiling work done at her house that has created a lot of dust.    Review of Systems  Constitutional: Negative.   HENT: Negative for sore throat, rhinorrhea, sneezing and postnasal drip.        " red throat"  Respiratory: Negative.   Cardiovascular: Negative.   Skin: Negative.   Psychiatric/Behavioral: Negative.    Past Medical History  Diagnosis Date  . Diverticulosis   . Osteoporosis   . Hypertension   . Irritable bowel syndrome   . UTI (lower urinary tract infection)     History   Social History  . Marital Status: Married    Spouse Name: N/A    Number of Children: N/A  . Years of Education: N/A   Occupational History  . retired    Social History Main Topics  . Smoking status: Never Smoker   . Smokeless tobacco: Not on file  . Alcohol Use: No  . Drug Use: No  . Sexual Activity: Not Currently   Other Topics Concern  . Not on file   Social History Narrative  . No narrative on file    Past Surgical History  Procedure Laterality Date  . Lumbar laminectomy    . Lumbar fusion    . Partial resection of colon for tumor    . Colon polyps    . Total hip arthroplasty      right  . Appendectomy      Family History  Problem Relation Age of Onset  . Colon cancer Mother   . Cancer Father   . Lung cancer Father   . Ovarian cancer Sister   . Breast cancer Sister   . Liver cancer Brother     Allergies  Allergen Reactions  . Codeine     REACTION: headaches  . Flagyl [Metronidazole Hcl]     nausea    Current Outpatient Prescriptions on File Prior to Visit  Medication Sig Dispense Refill  . b complex vitamins tablet Take 1 tablet by mouth daily.        . bisoprolol-hydrochlorothiazide (ZIAC)  5-6.25 MG per tablet TAKE ONE TABLET BY MOUTH EVERY DAY  90 tablet  0  . Glucosamine-Chondroitin 250-200 MG TABS Take 1 tablet by mouth daily.  90 each  3  . hydrocortisone-pramoxine (ANALPRAM-HC) 2.5-1 % rectal cream Place rectally 2 (two) times daily.  30 g  3  . meloxicam (MOBIC) 15 MG tablet TAKE ONE TABLET BY MOUTH EVERY DAY AS NEEDED  30 tablet  0  . Multiple Minerals-Vitamins (CITRACAL PLUS) TABS Take 1 tablet by mouth daily.  90 tablet  3  . Omega-3 Fatty Acids (FISH OIL) 1000 MG CAPS Take 2 capsules (2,000 mg total) by mouth 2 (two) times daily.  120 capsule  3  . Vitamin D, Ergocalciferol, (DRISDOL) 50000 UNITS CAPS TAKE ONE CAPSULE BY MOUTH ONCE A WEEK  5 capsule  6   No current facility-administered medications on file prior to visit.    BP 118/64  Pulse 58  Temp(Src) 97.9 F (36.6 C) (Oral)  Wt 117 lb (53.071 kg)  BMI 19.47 kg/m2chart    Objective:   Physical Exam  Constitutional: She is oriented to  person, place, and time. She appears well-developed and well-nourished.  Neck: Normal range of motion.  Cardiovascular: Normal rate, regular rhythm and normal heart sounds.   Pulmonary/Chest: Effort normal and breath sounds normal.  Neurological: She is alert and oriented to person, place, and time.  Skin: Skin is warm and dry.  Psychiatric: She has a normal mood and affect.          Assessment & Plan:  Assessment: 1. Pharyngitis mild  Plan: claritin of Zyrtec once daily IF she develops any soreness to her throat. Otherwise her throat looks normal. Recheck as scheduled and as needed.

## 2013-06-19 NOTE — Patient Instructions (Addendum)
Viral Pharyngitis Viral pharyngitis is a viral infection that produces redness, pain, and swelling (inflammation) of the throat. It can spread from person to person (contagious). CAUSES Viral pharyngitis is caused by inhaling a large amount of certain germs called viruses. Many different viruses cause viral pharyngitis. SYMPTOMS Symptoms of viral pharyngitis include:  Sore throat.  Tiredness.  Stuffy nose.  Low-grade fever.  Congestion.  Cough. TREATMENT Treatment includes rest, drinking plenty of fluids, and the use of over-the-counter medication (approved by your caregiver). HOME CARE INSTRUCTIONS   Drink enough fluids to keep your urine clear or pale yellow.  Eat soft, cold foods such as ice cream, frozen ice pops, or gelatin dessert.  Gargle with warm salt water (1 tsp salt per 1 qt of water).  If over age 7, throat lozenges may be used safely.  Only take over-the-counter or prescription medicines for pain, discomfort, or fever as directed by your caregiver. Do not take aspirin. To help prevent spreading viral pharyngitis to others, avoid:  Mouth-to-mouth contact with others.  Sharing utensils for eating and drinking.  Coughing around others. SEEK MEDICAL CARE IF:   You are better in a few days, then become worse.  You have a fever or pain not helped by pain medicines.  There are any other changes that concern you. Document Released: 07/21/2005 Document Revised: 01/03/2012 Document Reviewed: 12/17/2010 [employer] Patient Information 2014 [employer].  

## 2013-06-22 ENCOUNTER — Other Ambulatory Visit: Payer: Self-pay | Admitting: *Deleted

## 2013-06-22 MED ORDER — BISOPROLOL-HYDROCHLOROTHIAZIDE 5-6.25 MG PO TABS: ORAL_TABLET | ORAL | Status: DC

## 2013-07-11 ENCOUNTER — Encounter: Payer: Self-pay | Admitting: Family Medicine

## 2013-07-11 ENCOUNTER — Ambulatory Visit (INDEPENDENT_AMBULATORY_CARE_PROVIDER_SITE_OTHER): Payer: Medicare Other | Admitting: Family Medicine

## 2013-07-11 VITALS — BP 140/80 | HR 72 | Temp 98.3°F | Resp 16 | Ht 65.0 in | Wt 118.0 lb

## 2013-07-11 DIAGNOSIS — Z23 Encounter for immunization: Secondary | ICD-10-CM

## 2013-07-11 DIAGNOSIS — M545 Low back pain, unspecified: Secondary | ICD-10-CM | POA: Diagnosis not present

## 2013-07-11 NOTE — Progress Notes (Signed)
  Subjective:    Patient ID: [name], female    DOB: 22-Jun-1932, 79 y.o.   MRN: [id number]  HPI Right low back pain.  Onset last Thursday. Sharp pain worse with movement. Worse after periods of sitting Worse with bending. No radicular pain.  No numbness or weakness. 8/10 pain with movement.  Minimal pain at rest. Tylenol and ice with mild relief. NKI. 2 prior back surgeries. No recent appetite or weight changes. No dysuria. Denies any skin rash.  Past Medical History  Diagnosis Date  . Diverticulosis   . Osteoporosis   . Hypertension   . Irritable bowel syndrome   . UTI (lower urinary tract infection)    Past Surgical History  Procedure Laterality Date  . Lumbar laminectomy    . Lumbar fusion    . Partial resection of colon for tumor    . Colon polyps    . Total hip arthroplasty      right  . Appendectomy      reports that she has never smoked. She does not have any smokeless tobacco history on file. She reports that she does not drink alcohol or use illicit drugs. family history includes Breast cancer in her sister; Cancer in her father; Colon cancer in her mother; Liver cancer in her brother; Lung cancer in her father; Ovarian cancer in her sister. Allergies  Allergen Reactions  . Codeine     REACTION: headaches  . Flagyl [Metronidazole Hcl]     nausea      Review of Systems  Constitutional: Negative for fever, chills, appetite change and unexpected weight change.  Respiratory: Negative for cough and shortness of breath.   Cardiovascular: Negative for chest pain and leg swelling.  Gastrointestinal: Negative for abdominal pain.  Genitourinary: Negative for dysuria.  Musculoskeletal: Positive for back pain.  Skin: Negative for rash.  Neurological: Negative for weakness and numbness.       Objective:   Physical Exam  Constitutional: She appears well-developed and well-nourished.  Cardiovascular: Normal rate and regular rhythm.   Pulmonary/Chest:  Effort normal and breath sounds normal. No respiratory distress. She has no wheezes. She has no rales.  Abdominal: Soft. There is no tenderness.  Musculoskeletal: She exhibits no edema.  Straight leg raise are negative. No right lower extremity edema. Full range of motion right knee and ankle. She has good range of motion with flexion and extension of the back. No spinal tenderness  Neurological:  Full-strength lower extremities. Symmetric reflexes knee and ankle bilaterally  Skin: No rash noted.          Assessment & Plan:  Right lumbar back pain. Suspect musculoskeletal. Start back meloxicam 15 mg daily. Alternate heat and ice. Touch base in one to 2 weeks if no better. Consider trial of physical therapy if no better in 2 weeks

## 2013-07-11 NOTE — Patient Instructions (Addendum)
Get back on Meloxicam 15 mg daily May continue with heat or ice Follow up promptly for any rash or worsening pain.

## 2013-09-03 ENCOUNTER — Other Ambulatory Visit: Payer: Self-pay

## 2013-09-03 DIAGNOSIS — Z1231 Encounter for screening mammogram for malignant neoplasm of breast: Secondary | ICD-10-CM

## 2013-10-03 ENCOUNTER — Ambulatory Visit
Admission: RE | Admit: 2013-10-03 | Discharge: 2013-10-03 | Disposition: A | Discharge status: Home / Self Care | Payer: Medicare Other | Source: Ambulatory Visit

## 2013-10-03 DIAGNOSIS — Z1231 Encounter for screening mammogram for malignant neoplasm of breast: Secondary | ICD-10-CM

## 2013-11-21 ENCOUNTER — Encounter: Payer: Self-pay | Admitting: Internal Medicine

## 2013-11-21 ENCOUNTER — Ambulatory Visit (INDEPENDENT_AMBULATORY_CARE_PROVIDER_SITE_OTHER): Payer: Medicare Other | Admitting: Internal Medicine

## 2013-11-21 VITALS — BP 130/80 | HR 72 | Temp 98.0°F | Resp 16 | Ht 65.0 in | Wt 119.0 lb

## 2013-11-21 DIAGNOSIS — M810 Age-related osteoporosis without current pathological fracture: Secondary | ICD-10-CM

## 2013-11-21 DIAGNOSIS — T887XXA Unspecified adverse effect of drug or medicament, initial encounter: Secondary | ICD-10-CM | POA: Diagnosis not present

## 2013-11-21 DIAGNOSIS — I10 Essential (primary) hypertension: Secondary | ICD-10-CM

## 2013-11-21 LAB — BASIC METABOLIC PANEL
BUN: 22 mg/dL (ref 6–23)
CO2: 28 mEq/L (ref 19–32)
Calcium: 9.3 mg/dL (ref 8.4–10.5)
Chloride: 104 mEq/L (ref 96–112)
Creatinine, Ser: 0.7 mg/dL (ref 0.4–1.2)
GFR: 80.31 mL/min (ref >60.00)
Glucose, Bld: 91 mg/dL (ref 70–99)
Potassium: 3.8 mEq/L (ref 3.5–5.1)
Sodium: 138 mEq/L (ref 135–145)

## 2013-11-21 LAB — CALCIUM: Calcium: 9.3 mg/dL (ref 8.4–10.5)

## 2013-11-21 MED ORDER — FISH OIL 1000 MG PO CAPS: 2000.0000 mg | ORAL_CAPSULE | Freq: Two times a day (BID) | ORAL | Status: DC

## 2013-11-21 MED ORDER — VITAMIN D (ERGOCALCIFEROL) 1.25 MG (50000 UNIT) PO CAPS: ORAL_CAPSULE | ORAL | Status: DC

## 2013-11-21 MED ORDER — CITRACAL PLUS PO TABS: 1.0000 | ORAL_TABLET | Freq: Every day | ORAL | Status: DC

## 2013-11-21 MED ORDER — GLUCOSAMINE-CHONDROITIN 250-200 MG PO TABS: 1.0000 | ORAL_TABLET | Freq: Every day | ORAL | Status: DC

## 2013-11-21 NOTE — Patient Instructions (Signed)
The patient is instructed to continue all medications as prescribed. Schedule followup with check out clerk upon leaving the clinic  

## 2013-11-21 NOTE — Progress Notes (Signed)
Subjective:    Patient ID: [name], female    DOB: 07-18-1932, 80 y.o.   MRN: [id number]  HPI Patient is a 80 year old female presents for followup of her hypertension also for refill of yearly medications for possible stenting purposes.  She has been on a combination of a beta blocker and a mild diuretic with good control of her blood pressure.  She takes meloxicam or Mobic as needed for osteoarthritic pain she is on calcium visual and vitamin D.  Appropriate blood work monitored today should include a vitamin D level calcium level and a basic metabolic panel   Review of Systems  Constitutional: Negative for activity change, appetite change and fatigue.  HENT: Negative for congestion, ear pain, postnasal drip and sinus pressure.   Eyes: Negative for redness and visual disturbance.  Respiratory: Negative for cough, shortness of breath and wheezing.   Gastrointestinal: Negative for abdominal pain and abdominal distention.  Genitourinary: Negative for dysuria, frequency and menstrual problem.  Musculoskeletal: Negative for arthralgias, joint swelling, myalgias and neck pain.  Skin: Negative for rash and wound.  Neurological: Negative for dizziness, weakness and headaches.  Hematological: Negative for adenopathy. Does not bruise/bleed easily.  Psychiatric/Behavioral: Negative for sleep disturbance and decreased concentration.       Past Medical History  Diagnosis Date  . Diverticulosis   . Osteoporosis   . Hypertension   . Irritable bowel syndrome   . UTI (lower urinary tract infection)     History   Social History  . Marital Status: Married    Spouse Name: N/A    Number of Children: N/A  . Years of Education: N/A   Occupational History  . retired    Social History Main Topics  . Smoking status: Never Smoker   . Smokeless tobacco: Not on file  . Alcohol Use: No  . Drug Use: No  . Sexual Activity: Not Currently   Other Topics Concern  . Not on file    Social History Narrative  . No narrative on file    Past Surgical History  Procedure Laterality Date  . Lumbar laminectomy    . Lumbar fusion    . Partial resection of colon for tumor    . Colon polyps    . Total hip arthroplasty      right  . Appendectomy      Family History  Problem Relation Age of Onset  . Colon cancer Mother   . Cancer Father   . Lung cancer Father   . Ovarian cancer Sister   . Breast cancer Sister   . Liver cancer Brother     Allergies  Allergen Reactions  . Codeine     REACTION: headaches  . Flagyl [Metronidazole Hcl]     nausea    Current Outpatient Prescriptions on File Prior to Visit  Medication Sig Dispense Refill  . b complex vitamins tablet Take 1 tablet by mouth daily.        . bisoprolol-hydrochlorothiazide (ZIAC) 5-6.25 MG per tablet TAKE ONE TABLET BY MOUTH EVERY DAY  90 tablet  3  . hydrocortisone-pramoxine (ANALPRAM-HC) 2.5-1 % rectal cream Place rectally 2 (two) times daily.  30 g  3  . meloxicam (MOBIC) 15 MG tablet TAKE ONE TABLET BY MOUTH EVERY DAY AS NEEDED  30 tablet  0   No current facility-administered medications on file prior to visit.    BP 130/80  Pulse 72  Temp(Src) 98 F (36.7 C)  Resp 16  Ht  5\' 5"  (1.651 m)  Wt 119 lb (53.978 kg)  BMI 19.80 kg/m2    Objective:   Physical Exam  Nursing note and vitals reviewed. Constitutional: She is oriented to person, place, and time. She appears well-developed and well-nourished. No distress.  HENT:  Head: Normocephalic and atraumatic.  Right Ear: External ear normal.  Left Ear: External ear normal.  Nose: Nose normal.  Mouth/Throat: Oropharynx is clear and moist.  Eyes: Conjunctivae and EOM are normal. Pupils are equal, round, and reactive to light.  Neck: Normal range of motion. Neck supple. No JVD present. No tracheal deviation present. No thyromegaly present.  Cardiovascular: Normal rate, regular rhythm, normal heart sounds and intact distal pulses.   No  murmur heard. Pulmonary/Chest: Effort normal and breath sounds normal. She has no wheezes. She exhibits no tenderness.  Abdominal: Soft. Bowel sounds are normal.  Musculoskeletal: Normal range of motion. She exhibits no edema and no tenderness.  Lymphadenopathy:    She has no cervical adenopathy.  Neurological: She is alert and oriented to person, place, and time. She has normal reflexes. No cranial nerve deficit.  Skin: Skin is warm and dry. She is not diaphoretic.  Psychiatric: She has a normal mood and affect. Her behavior is normal.          Assessment & Plan:  Today we will evaluate the cost benefit for the shingles vaccination for her.  We have refilled her medications we'll obtain appropriate blood work including basic metabolic panel the vitamin D level and calcium.  stableHTN

## 2013-11-21 NOTE — Progress Notes (Signed)
Pre visit review using our clinic review tool, if applicable. No additional management support is needed unless otherwise documented below in the visit note. 

## 2013-11-22 LAB — VITAMIN D 25 HYDROXY (VIT D DEFICIENCY, FRACTURES): Vit D, 25-Hydroxy: 59 ng/mL (ref 30–89)

## 2013-11-26 ENCOUNTER — Telehealth: Payer: Self-pay | Admitting: Internal Medicine

## 2013-11-26 NOTE — Telephone Encounter (Signed)
Relevant patient education mailed to patient.  

## 2014-01-02 DIAGNOSIS — L723 Sebaceous cyst: Secondary | ICD-10-CM | POA: Diagnosis not present

## 2014-01-02 DIAGNOSIS — I781 Nevus, non-neoplastic: Secondary | ICD-10-CM | POA: Diagnosis not present

## 2014-01-02 DIAGNOSIS — Z85828 Personal history of other malignant neoplasm of skin: Secondary | ICD-10-CM | POA: Diagnosis not present

## 2014-01-02 DIAGNOSIS — L821 Other seborrheic keratosis: Secondary | ICD-10-CM | POA: Diagnosis not present

## 2014-01-18 DIAGNOSIS — H52229 Regular astigmatism, unspecified eye: Secondary | ICD-10-CM | POA: Diagnosis not present

## 2014-01-18 DIAGNOSIS — H0019 Chalazion unspecified eye, unspecified eyelid: Secondary | ICD-10-CM | POA: Diagnosis not present

## 2014-01-18 DIAGNOSIS — H524 Presbyopia: Secondary | ICD-10-CM | POA: Diagnosis not present

## 2014-01-18 DIAGNOSIS — H5200 Hypermetropia, unspecified eye: Secondary | ICD-10-CM | POA: Diagnosis not present

## 2014-03-15 DIAGNOSIS — H00019 Hordeolum externum unspecified eye, unspecified eyelid: Secondary | ICD-10-CM | POA: Diagnosis not present

## 2014-04-16 ENCOUNTER — Encounter: Payer: Self-pay | Admitting: Family

## 2014-04-16 ENCOUNTER — Ambulatory Visit (INDEPENDENT_AMBULATORY_CARE_PROVIDER_SITE_OTHER): Payer: Medicare Other | Admitting: Family

## 2014-04-16 VITALS — BP 140/80 | HR 70 | Temp 98.3°F | Ht 65.0 in | Wt 119.0 lb

## 2014-04-16 DIAGNOSIS — L03119 Cellulitis of unspecified part of limb: Secondary | ICD-10-CM | POA: Diagnosis not present

## 2014-04-16 DIAGNOSIS — L03116 Cellulitis of left lower limb: Secondary | ICD-10-CM

## 2014-04-16 DIAGNOSIS — L02419 Cutaneous abscess of limb, unspecified: Secondary | ICD-10-CM | POA: Diagnosis not present

## 2014-04-16 MED ORDER — CEPHALEXIN 500 MG PO CAPS: 500.0000 mg | ORAL_CAPSULE | Freq: Four times a day (QID) | ORAL | Status: DC

## 2014-04-16 NOTE — Progress Notes (Signed)
Pre visit review using our clinic review tool, if applicable. No additional management support is needed unless otherwise documented below in the visit note. 

## 2014-04-16 NOTE — Progress Notes (Signed)
Subjective:    Patient ID: [name], female    DOB: 09-12-1932, 80 y.o.   MRN: [id number]  HPI 80 y.o. White female presents with chief complaint of "i have an infection to my leg". Pt states that she got a "bite" on the left leg on Saturday and it has continued to get more red and swollen. Pt states the area is tender to the touch, rates pain a 3 on a scale of 0-10, pain does not radiate, she has been putting neosporin and hydrogen peroxide on it. Pt denies fever, fatigue, SOB and change in appetite.     Review of Systems  Constitutional: Negative.   HENT: Negative.   Respiratory: Negative.   Cardiovascular: Negative.   Gastrointestinal: Negative.   Endocrine: Negative.   Musculoskeletal: Negative.   Skin:       Redness to left leg.   Allergic/Immunologic: Negative.   Neurological: Negative.   Hematological: Negative.   Psychiatric/Behavioral: Negative.    Past Medical History  Diagnosis Date  . Diverticulosis   . Osteoporosis   . Hypertension   . Irritable bowel syndrome   . UTI (lower urinary tract infection)     History   Social History  . Marital Status: Married    Spouse Name: N/A    Number of Children: N/A  . Years of Education: N/A   Occupational History  . retired    Social History Main Topics  . Smoking status: Never Smoker   . Smokeless tobacco: Not on file  . Alcohol Use: No  . Drug Use: No  . Sexual Activity: Not Currently   Other Topics Concern  . Not on file   Social History Narrative  . No narrative on file    Past Surgical History  Procedure Laterality Date  . Lumbar laminectomy    . Lumbar fusion    . Partial resection of colon for tumor    . Colon polyps    . Total hip arthroplasty      right  . Appendectomy      Family History  Problem Relation Age of Onset  . Colon cancer Mother   . Cancer Father   . Lung cancer Father   . Ovarian cancer Sister   . Breast cancer Sister   . Liver cancer Brother      Allergies  Allergen Reactions  . Codeine     REACTION: headaches  . Flagyl [Metronidazole Hcl]     nausea    Current Outpatient Prescriptions on File Prior to Visit  Medication Sig Dispense Refill  . b complex vitamins tablet Take 1 tablet by mouth daily.        . bisoprolol-hydrochlorothiazide (ZIAC) 5-6.25 MG per tablet TAKE ONE TABLET BY MOUTH EVERY DAY  90 tablet  3  . Glucosamine-Chondroitin 250-200 MG TABS Take 1 tablet by mouth daily.  90 each  3  . hydrocortisone-pramoxine (ANALPRAM-HC) 2.5-1 % rectal cream Place rectally 2 (two) times daily.  30 g  3  . meloxicam (MOBIC) 15 MG tablet TAKE ONE TABLET BY MOUTH EVERY DAY AS NEEDED  30 tablet  0  . Multiple Minerals-Vitamins (CITRACAL PLUS) TABS Take 1 tablet by mouth daily.  90 tablet  3  . Omega-3 Fatty Acids (FISH OIL) 1000 MG CAPS Take 2 capsules (2,000 mg total) by mouth 2 (two) times daily.  120 capsule  11  . Vitamin D, Ergocalciferol, (DRISDOL) 50000 UNITS CAPS capsule TAKE ONE CAPSULE BY MOUTH ONCE A WEEK  5 capsule  11   No current facility-administered medications on file prior to visit.    BP 140/80  Pulse 70  Temp(Src) 98.3 F (36.8 C) (Oral)  Ht 5\' 5"  (1.651 m)  Wt 119 lb (53.978 kg)  BMI 19.80 kg/m2chart    Objective:   Physical Exam  Constitutional: She is oriented to person, place, and time. She appears well-developed and well-nourished. She is active.  Cardiovascular: Normal rate, regular rhythm, normal heart sounds and normal pulses.   Pulmonary/Chest: Effort normal and breath sounds normal.  Neurological: She is alert and oriented to person, place, and time.  Skin: Skin is warm and dry. Abrasion noted. There is erythema.  Erythema and abrasion present to left leg. Tender to touch.   Psychiatric: She has a normal mood and affect. Her speech is normal and behavior is normal. Cognition and memory are normal.          Assessment & Plan:  [name] was seen today for no specified  reason.  Diagnoses and associated orders for this visit:  Cellulitis of left lower extremity  Other Orders - cephALEXin (KEFLEX) 500 MG capsule; Take 1 capsule (500 mg total) by mouth 4 (four) times daily.   Education: medication discussed in detail including risk and benefits. Pt will follow up if fever or increased swelling.

## 2014-04-16 NOTE — Patient Instructions (Signed)
Cellulitis Cellulitis is an infection of the skin and the tissue beneath it. The infected area is usually red and tender. Cellulitis occurs most often in the arms and lower legs.  CAUSES  Cellulitis is caused by bacteria that enter the skin through cracks or cuts in the skin. The most common types of bacteria that cause cellulitis are Staphylococcus and Streptococcus. SYMPTOMS   Redness and warmth.  Swelling.  Tenderness or pain.  Fever. DIAGNOSIS  Your caregiver can usually determine what is wrong based on a physical exam. Blood tests may also be done. TREATMENT  Treatment usually involves taking an antibiotic medicine. HOME CARE INSTRUCTIONS   Take your antibiotics as directed. Finish them even if you start to feel better.  Keep the infected arm or leg elevated to reduce swelling.  Apply a warm cloth to the affected area up to 4 times per day to relieve pain.  Only take over-the-counter or prescription medicines for pain, discomfort, or fever as directed by your caregiver.  Keep all follow-up appointments as directed by your caregiver. SEEK MEDICAL CARE IF:   You notice red streaks coming from the infected area.  Your red area gets larger or turns dark in color.  Your bone or joint underneath the infected area becomes painful after the skin has healed.  Your infection returns in the same area or another area.  You notice a swollen bump in the infected area.  You develop new symptoms. SEEK IMMEDIATE MEDICAL CARE IF:   You have a fever.  You feel very sleepy.  You develop vomiting or diarrhea.  You have a general ill feeling (malaise) with muscle aches and pains. MAKE SURE YOU:   Understand these instructions.  Will watch your condition.  Will get help right away if you are not doing well or get worse. Document Released: 07/21/2005 Document Revised: 04/11/2012 Document Reviewed: 12/27/2011 [employer] Patient Information 2015 [employer]. This information is  not intended to replace advice given to you by your health care provider. Make sure you discuss any questions you have with your health care provider.  

## 2014-04-23 ENCOUNTER — Encounter: Payer: Self-pay | Admitting: Physician Assistant

## 2014-04-23 ENCOUNTER — Ambulatory Visit (INDEPENDENT_AMBULATORY_CARE_PROVIDER_SITE_OTHER): Payer: Medicare Other | Admitting: Physician Assistant

## 2014-04-23 VITALS — BP 124/80 | HR 60 | Temp 98.2°F | Resp 18 | Wt 116.0 lb

## 2014-04-23 DIAGNOSIS — L02419 Cutaneous abscess of limb, unspecified: Secondary | ICD-10-CM

## 2014-04-23 DIAGNOSIS — L03119 Cellulitis of unspecified part of limb: Secondary | ICD-10-CM | POA: Diagnosis not present

## 2014-04-23 DIAGNOSIS — L03116 Cellulitis of left lower limb: Secondary | ICD-10-CM

## 2014-04-23 MED ORDER — DOXYCYCLINE HYCLATE 100 MG PO TABS: 100.0000 mg | ORAL_TABLET | Freq: Two times a day (BID) | ORAL | Status: DC

## 2014-04-23 NOTE — Progress Notes (Signed)
Subjective:    Patient ID: [name], female    DOB: Aug 15, 1932, 80 y.o.   MRN: [id number]  HPI Pt was seen 1 week ago for a possible insect bite, diagnosed with cellulitis, prescribed Keflex. On day 7 of 7 day course and states that her cellulitis area is still no better, still red, swollen, warm to the touch. She states that it is not that painful, and occassionally throbs and burns, but she is managing the pain with ibuprofen. She has been elevating her leg some, which helps with the swelling, but really only does this at night. Pt denies F/C/N/V/D/SOB.   Review of Systems As per HPI and otherwise negative.   Past Medical History  Diagnosis Date  . Diverticulosis   . Osteoporosis   . Hypertension   . Irritable bowel syndrome   . UTI (lower urinary tract infection)     History   Social History  . Marital Status: Married    Spouse Name: N/A    Number of Children: N/A  . Years of Education: N/A   Occupational History  . retired    Social History Main Topics  . Smoking status: Never Smoker   . Smokeless tobacco: Not on file  . Alcohol Use: No  . Drug Use: No  . Sexual Activity: Not Currently   Other Topics Concern  . Not on file   Social History Narrative  . No narrative on file    Past Surgical History  Procedure Laterality Date  . Lumbar laminectomy    . Lumbar fusion    . Partial resection of colon for tumor    . Colon polyps    . Total hip arthroplasty      right  . Appendectomy      Family History  Problem Relation Age of Onset  . Colon cancer Mother   . Cancer Father   . Lung cancer Father   . Ovarian cancer Sister   . Breast cancer Sister   . Liver cancer Brother     Allergies  Allergen Reactions  . Codeine     REACTION: headaches  . Flagyl [Metronidazole Hcl]     nausea    Current Outpatient Prescriptions on File Prior to Visit  Medication Sig Dispense Refill  . b complex vitamins tablet Take 1 tablet by mouth daily.         . bisoprolol-hydrochlorothiazide (ZIAC) 5-6.25 MG per tablet TAKE ONE TABLET BY MOUTH EVERY DAY  90 tablet  3  . cephALEXin (KEFLEX) 500 MG capsule Take 1 capsule (500 mg total) by mouth 4 (four) times daily.  28 capsule  0  . Glucosamine-Chondroitin 250-200 MG TABS Take 1 tablet by mouth daily.  90 each  3  . hydrocortisone-pramoxine (ANALPRAM-HC) 2.5-1 % rectal cream Place rectally 2 (two) times daily.  30 g  3  . meloxicam (MOBIC) 15 MG tablet TAKE ONE TABLET BY MOUTH EVERY DAY AS NEEDED  30 tablet  0  . Multiple Minerals-Vitamins (CITRACAL PLUS) TABS Take 1 tablet by mouth daily.  90 tablet  3  . Omega-3 Fatty Acids (FISH OIL) 1000 MG CAPS Take 2 capsules (2,000 mg total) by mouth 2 (two) times daily.  120 capsule  11  . Vitamin D, Ergocalciferol, (DRISDOL) 50000 UNITS CAPS capsule TAKE ONE CAPSULE BY MOUTH ONCE A WEEK  5 capsule  11   No current facility-administered medications on file prior to visit.    EXAM: BP 124/80  Pulse 60  Temp(Src) 98.2 F (36.8 C) (Oral)  Resp 18  Wt 116 lb (52.617 kg)      Objective:   Physical Exam  Nursing note and vitals reviewed. Constitutional: She is oriented to person, place, and time. She appears well-developed and well-nourished. No distress.  HENT:  Head: Normocephalic and atraumatic.  Eyes: Conjunctivae and EOM are normal. Pupils are equal, round, and reactive to light.  Neck: Normal range of motion.  Cardiovascular: Normal rate, regular rhythm and intact distal pulses.   Pulmonary/Chest: Effort normal and breath sounds normal. No respiratory distress. She exhibits no tenderness.  Musculoskeletal: Normal range of motion.  Neurological: She is alert and oriented to person, place, and time.  Skin: Skin is warm and dry. No rash noted. She is not diaphoretic. There is erythema. No pallor.  Anterior left lower leg has a 5-6 cm area of erythema and swelling. This is tender to palpation and warm to the touch. There is a central puncture  wound which has scabbed over. There is no fluctuance or visible foreign body.  Psychiatric: She has a normal mood and affect. Her behavior is normal. Judgment and thought content normal.     Lab Results  Component Value Date   WBC 10.0 05/21/2013   HGB 12.0 05/21/2013   HCT 35.7* 05/21/2013   PLT 171.0 05/21/2013   GLUCOSE 91 11/21/2013   CHOL 205* 05/21/2013   TRIG 95.0 05/21/2013   HDL 58.40 05/21/2013   LDLDIRECT 125.1 05/21/2013   LDLCALC 110* 04/14/2012   ALT 14 05/21/2013   AST 22 05/21/2013   NA 138 11/21/2013   K 3.8 11/21/2013   CL 104 11/21/2013   CREATININE 0.7 11/21/2013   BUN 22 11/21/2013   CO2 28 11/21/2013   TSH 1.71 05/21/2013        Assessment & Plan:  Chayla was seen today for insect bite.  Diagnoses and associated orders for this visit:  Cellulitis of left lower extremity Comments: No improvement with Keflex course. Will change therapy to Doxycycline. Continue leg elevation and warm compresses. - doxycycline (VIBRA-TABS) 100 MG tablet; Take 1 tablet (100 mg total) by mouth 2 (two) times daily.    Therapy changed to doxycycline. Will have pt continue leg raises and apply warm compresses.  Pt will return if leg is not improving despite new antibiotic choice.  Return precautions provided, and patient handout on Cellulitis.  Plan to follow up as needed, or for worsening or persistent symptoms despite treatment.  Patient Instructions  Doxycycline twice daily for 10 days for skin infection. Take with food to prevent nausea and vomiting.  Continue to keep the leg elevated to reduce the swelling. Also apply warm compresses to the area to help with pain.  If emergency symptoms discussed during visit developed, seek medical attention immediately.  Followup as needed, or for worsening or persistent symptoms despite treatment.

## 2014-04-23 NOTE — Patient Instructions (Signed)
Doxycycline twice daily for 10 days for skin infection. Take with food to prevent nausea and vomiting.  Continue to keep the leg elevated to reduce the swelling. Also apply warm compresses to the area to help with pain.  If emergency symptoms discussed during visit developed, seek medical attention immediately.  Followup as needed, or for worsening or persistent symptoms despite treatment.    Cellulitis Cellulitis is an infection of the skin and the tissue under the skin. The infected area is usually red and tender. This happens most often in the arms and lower legs. HOME CARE   Take your antibiotic medicine as told. Finish the medicine even if you start to feel better.  Keep the infected arm or leg raised (elevated).  Put a warm cloth on the area up to 4 times per day.  Only take medicines as told by your doctor.  Keep all doctor visits as told. GET HELP RIGHT AWAY IF:   You have a fever.  You feel very sleepy.  You throw up (vomit) or have watery poop (diarrhea).  You feel sick and have muscle aches and pains.  You see red streaks on the skin coming from the infected area.  Your red area gets bigger or turns a dark color.  Your bone or joint under the infected area is painful after the skin heals.  Your infection comes back in the same area or different area.  You have a puffy (swollen) bump in the infected area.  You have new symptoms. MAKE SURE YOU:   Understand these instructions.  Will watch your condition.  Will get help right away if you are not doing well or get worse. Document Released: 03/29/2008 Document Revised: 04/11/2012 Document Reviewed: 12/27/2011 [facility] Patient Information 2015 [place], [place]. This information is not intended to replace advice given to you by your health care provider. Make sure you discuss any questions you have with your health care provider.

## 2014-04-23 NOTE — Progress Notes (Signed)
Pre visit review using our clinic review tool, if applicable. No additional management support is needed unless otherwise documented below in the visit note. 

## 2014-05-01 ENCOUNTER — Ambulatory Visit (INDEPENDENT_AMBULATORY_CARE_PROVIDER_SITE_OTHER): Payer: Medicare Other | Admitting: Family Medicine

## 2014-05-01 ENCOUNTER — Encounter: Payer: Self-pay | Admitting: Family Medicine

## 2014-05-01 VITALS — BP 128/68 | HR 74 | Temp 98.4°F | Wt 117.0 lb

## 2014-05-01 DIAGNOSIS — L02419 Cutaneous abscess of limb, unspecified: Secondary | ICD-10-CM

## 2014-05-01 DIAGNOSIS — L03119 Cellulitis of unspecified part of limb: Secondary | ICD-10-CM | POA: Diagnosis not present

## 2014-05-01 DIAGNOSIS — L03116 Cellulitis of left lower limb: Secondary | ICD-10-CM

## 2014-05-01 MED ORDER — AMOXICILLIN-POT CLAVULANATE 875-125 MG PO TABS: 1.0000 | ORAL_TABLET | Freq: Two times a day (BID) | ORAL | Status: AC

## 2014-05-01 NOTE — Progress Notes (Signed)
   Subjective:    Patient ID: [name], female    DOB: September 26, 1932, 80 y.o.   MRN: [id number]  HPI Persistent redness left leg. Noted redness and mild swelling couple weeks ago. She was treated initially with Keflex without much improvement. Subsequently changed to doxycycline. She has 2 more days of that. No history of MRSA. No recall of injury. No fevers or chills. Swelling is worse late in the day. Her redness is localized. She has very small scabbed area but does not recall any bite or injury. No history of any pustules.  Past Medical History  Diagnosis Date  . Diverticulosis   . Osteoporosis   . Hypertension   . Irritable bowel syndrome   . UTI (lower urinary tract infection)    Past Surgical History  Procedure Laterality Date  . Lumbar laminectomy    . Lumbar fusion    . Partial resection of colon for tumor    . Colon polyps    . Total hip arthroplasty      right  . Appendectomy      reports that she has never smoked. She does not have any smokeless tobacco history on file. She reports that she does not drink alcohol or use illicit drugs. family history includes Breast cancer in her sister; Cancer in her father; Colon cancer in her mother; Liver cancer in her brother; Lung cancer in her father; Ovarian cancer in her sister. Allergies  Allergen Reactions  . Codeine     REACTION: headaches  . Flagyl [Metronidazole Hcl]     nausea      Review of Systems  Constitutional: Negative for fever and chills.  Hematological: Negative for adenopathy.       Objective:   Physical Exam  Constitutional: She appears well-developed and well-nourished.  Cardiovascular: Normal rate.   Pulmonary/Chest: Effort normal and breath sounds normal. No respiratory distress. She has no wheezes. She has no rales.  Skin:  Left leg reveals erythema 2-3 cm. Slightly indurated in the center. No fluctuance. Very small scabbed area laterally. No warmth. Nontender.            Assessment & Plan:  Question persistent cellulitis left leg. She has already taken course of Keflex and doxycycline without resolution. No evidence for abscess. Question foreign body though she does not recall any injury. Augmentin 875 mg twice a day for 10 days. Continue elevation warm compresses. X-rays and further evaluation not resolving over the next couple of weeks

## 2014-05-01 NOTE — Progress Notes (Signed)
Pre visit review using our clinic review tool, if applicable. No additional management support is needed unless otherwise documented below in the visit note. 

## 2014-05-01 NOTE — Patient Instructions (Signed)
Cellulitis Cellulitis is an infection of the skin and the tissue beneath it. The infected area is usually red and tender. Cellulitis occurs most often in the arms and lower legs.  CAUSES  Cellulitis is caused by bacteria that enter the skin through cracks or cuts in the skin. The most common types of bacteria that cause cellulitis are Staphylococcus and Streptococcus. SYMPTOMS   Redness and warmth.  Swelling.  Tenderness or pain.  Fever. DIAGNOSIS  Your caregiver can usually determine what is wrong based on a physical exam. Blood tests may also be done. TREATMENT  Treatment usually involves taking an antibiotic medicine. HOME CARE INSTRUCTIONS   Take your antibiotics as directed. Finish them even if you start to feel better.  Keep the infected arm or leg elevated to reduce swelling.  Apply a warm cloth to the affected area up to 4 times per day to relieve pain.  Only take over-the-counter or prescription medicines for pain, discomfort, or fever as directed by your caregiver.  Keep all follow-up appointments as directed by your caregiver. SEEK MEDICAL CARE IF:   You notice red streaks coming from the infected area.  Your red area gets larger or turns dark in color.  Your bone or joint underneath the infected area becomes painful after the skin has healed.  Your infection returns in the same area or another area.  You notice a swollen bump in the infected area.  You develop new symptoms. SEEK IMMEDIATE MEDICAL CARE IF:   You have a fever.  You feel very sleepy.  You develop vomiting or diarrhea.  You have a general ill feeling (malaise) with muscle aches and pains. MAKE SURE YOU:   Understand these instructions.  Will watch your condition.  Will get help right away if you are not doing well or get worse. Document Released: 07/21/2005 Document Revised: 04/11/2012 Document Reviewed: 12/27/2011 [employer] Patient Information 2015 [employer]. This information is  not intended to replace advice given to you by your health care provider. Make sure you discuss any questions you have with your health care provider.  

## 2014-05-10 ENCOUNTER — Telehealth: Payer: Self-pay | Admitting: Internal Medicine

## 2014-05-10 MED ORDER — VANCOMYCIN HCL 125 MG PO CAPS: 125.0000 mg | ORAL_CAPSULE | Freq: Four times a day (QID) | ORAL | Status: DC

## 2014-05-10 NOTE — Telephone Encounter (Signed)
With multiple recent antibiotics, we need to rule out C Difficile.  I recommend we check stool PCR for C difficile And start Vancomycin 125 mg QID for 10 days (she cannot take Flagyl secondary to nausea). Needs office follow up by early next week.  Stay well hydrated.

## 2014-05-10 NOTE — Telephone Encounter (Signed)
Pt called stating the new RX called in cost $125 and the current one she's on is causing diarrhea, she would like to know if it is okay for her to stop all medications until she comes in on Monday?  Please advise.

## 2014-05-10 NOTE — Telephone Encounter (Signed)
Pt was seen on 7/8, dr. Elease Hashimoto informed her to make him aware if symptoms did not get any better, pt states swelling in the leg, and stomach is nausea, states everything she eats gives her diarrhea.

## 2014-05-10 NOTE — Telephone Encounter (Signed)
Per Dr. Elease Hashimoto ok for patient not to take medication. Pt stated that she is not having any fever or cramps. Pt does have appointment at 9:45 on Monday.

## 2014-05-10 NOTE — Telephone Encounter (Signed)
RX sent to pharmacy and pt is aware

## 2014-05-13 ENCOUNTER — Encounter: Payer: Self-pay | Admitting: Family Medicine

## 2014-05-13 ENCOUNTER — Ambulatory Visit (INDEPENDENT_AMBULATORY_CARE_PROVIDER_SITE_OTHER): Payer: Medicare Other | Admitting: Family Medicine

## 2014-05-13 VITALS — BP 126/70 | HR 67 | Wt 117.0 lb

## 2014-05-13 DIAGNOSIS — M79605 Pain in left leg: Secondary | ICD-10-CM

## 2014-05-13 DIAGNOSIS — M79609 Pain in unspecified limb: Secondary | ICD-10-CM

## 2014-05-13 NOTE — Patient Instructions (Signed)
We will call you with referral to Dr French Ana

## 2014-05-13 NOTE — Progress Notes (Signed)
Pre visit review using our clinic review tool, if applicable. No additional management support is needed unless otherwise documented below in the visit note. 

## 2014-05-13 NOTE — Progress Notes (Signed)
   Subjective:    Patient ID: [name], female    DOB: [birth year], 79 y.o.   MRN: [id number]  HPI Patient seen with persistent swelling and redness left leg. She initially had a small cut after working outdoors around some brush. She was treated initially with Keflex and subsequently doxycycline and Augmentin. Her area of redness has reduced somewhat but she still has area about 2 x 3 cm of erythema and induration. She was not aware of any definite foreign body at the time this started. She's never had any fluctuance. No fevers or chills. She had some diarrhea last week which has since resolved.   Review of Systems  Constitutional: Negative for fever and chills.       Objective:   Physical Exam  Constitutional: She appears well-developed and well-nourished.  Cardiovascular: Normal rate and regular rhythm.   Pulmonary/Chest: Effort normal and breath sounds normal. No respiratory distress. She has no wheezes. She has no rales.  Skin:  Left lower lateral leg reveals 2 and half to 3 cm erythema with induration in the center. Slightly tender. Non-warm to touch. No fluctuance.          Assessment & Plan:  Persistent area of erythema and induration left lower leg. Question retained foreign body. She's been on several runs of antibiotic. Cellulitis changes have improved. We recommend orthopedic referral this time for further evaluation. We talked about possible plain x-rays but explained any natural material would not likely show up on x-ray

## 2014-05-14 DIAGNOSIS — M25569 Pain in unspecified knee: Secondary | ICD-10-CM | POA: Diagnosis not present

## 2014-06-02 ENCOUNTER — Other Ambulatory Visit: Payer: Self-pay | Admitting: Internal Medicine

## 2014-06-20 DIAGNOSIS — L259 Unspecified contact dermatitis, unspecified cause: Secondary | ICD-10-CM | POA: Diagnosis not present

## 2014-07-20 ENCOUNTER — Other Ambulatory Visit: Payer: Self-pay | Admitting: Internal Medicine

## 2014-08-02 DIAGNOSIS — Z23 Encounter for immunization: Secondary | ICD-10-CM | POA: Diagnosis not present

## 2014-09-13 ENCOUNTER — Ambulatory Visit (INDEPENDENT_AMBULATORY_CARE_PROVIDER_SITE_OTHER): Payer: Medicare Other | Admitting: Family Medicine

## 2014-09-13 ENCOUNTER — Encounter: Payer: Self-pay | Admitting: Family Medicine

## 2014-09-13 VITALS — BP 140/80 | HR 81 | Temp 98.2°F | Wt 116.0 lb

## 2014-09-13 DIAGNOSIS — J209 Acute bronchitis, unspecified: Secondary | ICD-10-CM | POA: Diagnosis not present

## 2014-09-13 MED ORDER — AZITHROMYCIN 250 MG PO TABS: ORAL_TABLET | ORAL | Status: DC

## 2014-09-13 NOTE — Progress Notes (Signed)
Pre visit review using our clinic review tool, if applicable. No additional management support is needed unless otherwise documented below in the visit note. 

## 2014-09-13 NOTE — Progress Notes (Signed)
   Subjective:    Patient ID: [name], female    DOB: [birth year], 80 y.o.   MRN: [id number]  HPI  Acute visit. Patient seen with about one week history of some sinus congestion, facial pressure and intermittent cough. She she may have had some low-grade fevers off and on. Her cough is mostly clear. No nausea or vomiting. Great-grandson with somewhat similar symptoms. She is using over-the-counter medications including Mucinex and Robitussin. Robitussin does help her cough somewhat. Nonsmoker.  Past Medical History  Diagnosis Date  . Diverticulosis   . Osteoporosis   . Hypertension   . Irritable bowel syndrome   . UTI (lower urinary tract infection)    Past Surgical History  Procedure Laterality Date  . Lumbar laminectomy    . Lumbar fusion    . Partial resection of colon for tumor    . Colon polyps    . Total hip arthroplasty      right  . Appendectomy      reports that she has never smoked. She does not have any smokeless tobacco history on file. She reports that she does not drink alcohol or use illicit drugs. family history includes Breast cancer in her sister; Cancer in her father; Colon cancer in her mother; Liver cancer in her brother; Lung cancer in her father; Ovarian cancer in her sister. Allergies  Allergen Reactions  . Codeine     REACTION: headaches  . Flagyl [Metronidazole Hcl]     nausea      Review of Systems  Constitutional: Positive for fever and fatigue. Negative for chills.  HENT: Positive for congestion and sinus pressure.   Respiratory: Positive for cough.        Objective:   Physical Exam  Constitutional: She appears well-developed and well-nourished. No distress.  HENT:  Right Ear: External ear normal.  Left Ear: External ear normal.  Mouth/Throat: Oropharynx is clear and moist.  Neck: Neck supple.  Cardiovascular: Normal rate and regular rhythm.   Pulmonary/Chest: Effort normal and breath sounds normal. No respiratory  distress. She has no wheezes. She has no rales.  Lymphadenopathy:    She has no cervical adenopathy.          Assessment & Plan:  Acute bronchitis/URI. Suspect viral. We've recommended reassurance and over-the-counter medications and observe. Patient is requesting Zithromax. We stated we would only recommend starting if she has any fever or worsening symptoms.

## 2014-09-13 NOTE — Patient Instructions (Signed)

## 2014-10-28 DIAGNOSIS — M25512 Pain in left shoulder: Secondary | ICD-10-CM | POA: Diagnosis not present

## 2014-10-30 ENCOUNTER — Encounter: Payer: Medicare Other | Admitting: Internal Medicine

## 2014-10-30 ENCOUNTER — Telehealth: Payer: Self-pay | Admitting: Family Medicine

## 2014-10-30 MED ORDER — BISOPROLOL-HYDROCHLOROTHIAZIDE 5-6.25 MG PO TABS: 1.0000 | ORAL_TABLET | Freq: Every day | ORAL | Status: DC

## 2014-10-30 NOTE — Telephone Encounter (Signed)
rx sent pharmacy

## 2014-10-30 NOTE — Telephone Encounter (Signed)
Patient need a re-fill on bisoprolol-hydrochlorothiazide (ZIAC) 5-6.25 MG per tablet

## 2014-11-11 ENCOUNTER — Telehealth: Payer: Self-pay

## 2014-11-11 MED ORDER — MELOXICAM 15 MG PO TABS: 15.0000 mg | ORAL_TABLET | Freq: Every day | ORAL | Status: DC | PRN

## 2014-11-11 NOTE — Telephone Encounter (Signed)
Rx request for meloxicam 15 mg tablet- Take 1 tablet by mouth every day as needed #30  Pharm:  [facility] [place]  Pls advise.

## 2014-11-11 NOTE — Telephone Encounter (Signed)
Rx sent to pharmacy   

## 2014-11-22 ENCOUNTER — Ambulatory Visit (INDEPENDENT_AMBULATORY_CARE_PROVIDER_SITE_OTHER): Payer: Medicare Other | Admitting: Family Medicine

## 2014-11-22 ENCOUNTER — Encounter: Payer: Self-pay | Admitting: Family Medicine

## 2014-11-22 VITALS — BP 128/70 | HR 83 | Temp 97.2°F | Ht 64.0 in | Wt 118.0 lb

## 2014-11-22 DIAGNOSIS — I10 Essential (primary) hypertension: Secondary | ICD-10-CM | POA: Diagnosis not present

## 2014-11-22 DIAGNOSIS — Z0000 Encounter for general adult medical examination without abnormal findings: Secondary | ICD-10-CM | POA: Diagnosis not present

## 2014-11-22 DIAGNOSIS — M810 Age-related osteoporosis without current pathological fracture: Secondary | ICD-10-CM

## 2014-11-22 DIAGNOSIS — Z23 Encounter for immunization: Secondary | ICD-10-CM

## 2014-11-22 LAB — BASIC METABOLIC PANEL
BUN: 19 mg/dL (ref 6–23)
CO2: 27 mEq/L (ref 19–32)
Calcium: 9.1 mg/dL (ref 8.4–10.5)
Chloride: 105 mEq/L (ref 96–112)
Creatinine, Ser: 0.81 mg/dL (ref 0.40–1.20)
GFR: 72.17 mL/min (ref >60.00)
Glucose, Bld: 92 mg/dL (ref 70–99)
Potassium: 3.6 mEq/L (ref 3.5–5.1)
Sodium: 140 mEq/L (ref 135–145)

## 2014-11-22 MED ORDER — GLUCOSAMINE-CHONDROITIN 250-200 MG PO TABS: 1.0000 | ORAL_TABLET | Freq: Every day | ORAL | Status: DC

## 2014-11-22 MED ORDER — CITRACAL PLUS PO TABS: 1.0000 | ORAL_TABLET | Freq: Every day | ORAL | Status: DC

## 2014-11-22 MED ORDER — VITAMIN D (ERGOCALCIFEROL) 1.25 MG (50000 UNIT) PO CAPS
50000.0000 [IU] | ORAL_CAPSULE | ORAL | Status: DC
Start: 2014-11-22 — End: 2015-05-28

## 2014-11-22 MED ORDER — FISH OIL 1000 MG PO CAPS: 2000.0000 mg | ORAL_CAPSULE | Freq: Two times a day (BID) | ORAL | Status: DC

## 2014-11-22 NOTE — Progress Notes (Signed)
Subjective:    Patient ID: [name], female    DOB: Sep 14, 1932, 81 y.o.   MRN: [id number]  HPI   Patient here for Medicare wellness exam and medical follow-up. Her chronic medical problems include history of osteoporosis, irritable bowel syndrome, hyperlipidemia, hypertension, diverticulosis, osteoarthritis. Previously took Fosamax. Takes regular calcium and vitamin D and is fairly active. She's not had DEXA scan in over couple years. Last colonoscopy was September 2011 and due for repeat this September. Her mother had colon cancer. She takes Ziac for hypertension. Blood pressure well controlled. No dizziness. No headaches. No recent chest pains.  She has not had Prevnar 13. Otherwise, immunizations up-to-date. She is also due for repeat mammogram.  Past Medical History  Diagnosis Date  . Diverticulosis   . Osteoporosis   . Hypertension   . Irritable bowel syndrome   . UTI (lower urinary tract infection)    Past Surgical History  Procedure Laterality Date  . Lumbar laminectomy    . Lumbar fusion    . Partial resection of colon for tumor    . Colon polyps    . Total hip arthroplasty      right  . Appendectomy      reports that she has never smoked. She does not have any smokeless tobacco history on file. She reports that she does not drink alcohol or use illicit drugs. family history includes Breast cancer in her sister; Cancer in her father; Colon cancer in her mother; Liver cancer in her brother; Lung cancer in her father; Ovarian cancer in her sister. Allergies  Allergen Reactions  . Codeine     REACTION: headaches  . Flagyl [Metronidazole Hcl]     nausea   1.  Risk factors based on Past Medical , Social, and Family history reviewed and as indicated above with no changes 2.  Limitations in physical activities None.  No recent falls. 3.  Depression/mood No active depression or anxiety issues 4.  Hearing No defiits 5.  ADLs independent in all. 6.  Cognitive  function (orientation to time and place, language, writing, speech,memory) no short or long term memory issues.  Language and judgement intact. 7.  Home Safety no issues 8.  Height, weight, and visual acuity.all stable. 9.  Counseling discussed weightbearing exercise and continued calcium and vitamin D 10. Recommendation of preventive services. Prevnar 13 11. Labs based on risk factors basic metabolic panel 12. Care Plan as above 13. Other Providers none 14. Written schedule of screening/prevention services given to patient.    Review of Systems  Constitutional: Negative for fever, activity change, appetite change, fatigue and unexpected weight change.  HENT: Negative for ear pain, hearing loss, sore throat and trouble swallowing.   Eyes: Negative for visual disturbance.  Respiratory: Negative for cough and shortness of breath.   Cardiovascular: Negative for chest pain and palpitations.  Gastrointestinal: Negative for abdominal pain, diarrhea, constipation and blood in stool.  Genitourinary: Negative for dysuria and hematuria.  Musculoskeletal: Negative for myalgias, back pain and arthralgias.  Skin: Negative for rash.  Neurological: Negative for dizziness, syncope and headaches.  Hematological: Negative for adenopathy.  Psychiatric/Behavioral: Negative for confusion and dysphoric mood.       Objective:   Physical Exam  Constitutional: She is oriented to person, place, and time. She appears well-developed and well-nourished.  HENT:  Head: Normocephalic and atraumatic.  Eyes: EOM are normal. Pupils are equal, round, and reactive to light.  Neck: Normal range of motion. Neck supple. No  thyromegaly present.  Cardiovascular: Normal rate, regular rhythm and normal heart sounds.   No murmur heard. Pulmonary/Chest: Breath sounds normal. No respiratory distress. She has no wheezes. She has no rales.  Abdominal: Soft. Bowel sounds are normal. She exhibits no distension and no mass. There  is no tenderness. There is no rebound and no guarding.  Musculoskeletal: Normal range of motion. She exhibits no edema.  Lymphadenopathy:    She has no cervical adenopathy.  Neurological: She is alert and oriented to person, place, and time. She displays normal reflexes. No cranial nerve deficit.  Skin: No rash noted.  Psychiatric: She has a normal mood and affect. Her behavior is normal. Judgment and thought content normal.          Assessment & Plan:  #1 health maintenance. Schedule mammogram. Schedule repeat DEXA scan. Continue yearly flu vaccine #2 hypertension well controlled. Check basic metabolic panel #3 history osteoporosis. Repeat DEXA scan as above. Continue adequate calcium and vitamin D. Previous vitamin D levels have been at goal.

## 2014-11-22 NOTE — Progress Notes (Signed)
Pre visit review using our clinic review tool, if applicable. No additional management support is needed unless otherwise documented below in the visit note. 

## 2014-11-22 NOTE — Patient Instructions (Signed)
Osteoporosis Throughout your life, your body breaks down old bone and replaces it with new bone. As you get older, your body does not replace bone as quickly as it breaks it down. By the age of 30 years, most people begin to gradually lose bone because of the imbalance between bone loss and replacement. Some people lose more bone than others. Bone loss beyond a specified normal degree is considered osteoporosis.  Osteoporosis affects the strength and durability of your bones. The inside of the ends of your bones and your flat bones, like the bones of your pelvis, look like honeycomb, filled with tiny open spaces. As bone loss occurs, your bones become less dense. This means that the open spaces inside your bones become bigger and the walls between these spaces become thinner. This makes your bones weaker. Bones of a person with osteoporosis can become so weak that they can break (fracture) during minor accidents, such as a simple fall. CAUSES  The following factors have been associated with the development of osteoporosis:  Smoking.  Drinking more than 2 alcoholic drinks several days per week.  Long-term use of certain medicines:  Corticosteroids.  Chemotherapy medicines.  Thyroid medicines.  Antiepileptic medicines.  Gonadal hormone suppression medicine.  Immunosuppression medicine.  Being underweight.  Lack of physical activity.  Lack of exposure to the sun. This can lead to vitamin D deficiency.  Certain medical conditions:  Certain inflammatory bowel diseases, such as Crohn disease and ulcerative colitis.  Diabetes.  Hyperthyroidism.  Hyperparathyroidism. RISK FACTORS Anyone can develop osteoporosis. However, the following factors can increase your risk of developing osteoporosis:  Gender--Women are at higher risk than men.  Age--Being older than 50 years increases your risk.  Ethnicity--White and Asian people have an increased risk.  Weight --Being extremely  underweight can increase your risk of osteoporosis.  Family history of osteoporosis--Having a family member who has developed osteoporosis can increase your risk. SYMPTOMS  Usually, people with osteoporosis have no symptoms.  DIAGNOSIS  Signs during a physical exam that may prompt your caregiver to suspect osteoporosis include:  Decreased height. This is usually caused by the compression of the bones that form your spine (vertebrae) because they have weakened and become fractured.  A curving or rounding of the upper back (kyphosis). To confirm signs of osteoporosis, your caregiver may request a procedure that uses 2 low-dose X-ray beams with different levels of energy to measure your bone mineral density (dual-energy X-ray absorptiometry [DXA]). Also, your caregiver may check your level of vitamin D. TREATMENT  The goal of osteoporosis treatment is to strengthen bones in order to decrease the risk of bone fractures. There are different types of medicines available to help achieve this goal. Some of these medicines work by slowing the processes of bone loss. Some medicines work by increasing bone density. Treatment also involves making sure that your levels of calcium and vitamin D are adequate. PREVENTION  There are things you can do to help prevent osteoporosis. Adequate intake of calcium and vitamin D can help you achieve optimal bone mineral density. Regular exercise can also help, especially resistance and weight-bearing activities. If you smoke, quitting smoking is an important part of osteoporosis prevention. MAKE SURE YOU:  Understand these instructions.  Will watch your condition.  Will get help right away if you are not doing well or get worse. FOR MORE INFORMATION www.osteo.org and www.nof.org Document Released: 07/21/2005 Document Revised: 02/05/2013 Document Reviewed: 09/25/2011 Exit[employer] Patient Information 2015 [employer]. This information is not   intended to replace advice  given to you by your health care provider. Make sure you discuss any questions you have with your health care provider.  

## 2014-11-25 DIAGNOSIS — M25512 Pain in left shoulder: Secondary | ICD-10-CM | POA: Diagnosis not present

## 2014-11-27 ENCOUNTER — Ambulatory Visit (INDEPENDENT_AMBULATORY_CARE_PROVIDER_SITE_OTHER)
Admission: RE | Admit: 2014-11-27 | Discharge: 2014-11-27 | Disposition: A | Discharge status: Home / Self Care | Payer: Medicare Other | Source: Ambulatory Visit | Attending: Family Medicine | Admitting: Family Medicine

## 2014-11-27 DIAGNOSIS — M810 Age-related osteoporosis without current pathological fracture: Secondary | ICD-10-CM

## 2014-11-30 DIAGNOSIS — M19012 Primary osteoarthritis, left shoulder: Secondary | ICD-10-CM | POA: Diagnosis not present

## 2014-12-04 ENCOUNTER — Other Ambulatory Visit: Payer: Self-pay

## 2014-12-04 ENCOUNTER — Telehealth: Payer: Self-pay | Admitting: Family Medicine

## 2014-12-04 MED ORDER — ALENDRONATE SODIUM 70 MG PO TABS: 70.0000 mg | ORAL_TABLET | ORAL | Status: DC

## 2014-12-04 NOTE — Telephone Encounter (Signed)
Pt returning your call

## 2014-12-04 NOTE — Telephone Encounter (Signed)
Pt informed

## 2014-12-05 DIAGNOSIS — M19012 Primary osteoarthritis, left shoulder: Secondary | ICD-10-CM | POA: Diagnosis not present

## 2014-12-05 DIAGNOSIS — M25512 Pain in left shoulder: Secondary | ICD-10-CM | POA: Diagnosis not present

## 2014-12-18 DIAGNOSIS — G8918 Other acute postprocedural pain: Secondary | ICD-10-CM | POA: Diagnosis not present

## 2014-12-18 DIAGNOSIS — M24112 Other articular cartilage disorders, left shoulder: Secondary | ICD-10-CM | POA: Diagnosis not present

## 2014-12-18 DIAGNOSIS — M75122 Complete rotator cuff tear or rupture of left shoulder, not specified as traumatic: Secondary | ICD-10-CM | POA: Diagnosis not present

## 2014-12-18 DIAGNOSIS — M7542 Impingement syndrome of left shoulder: Secondary | ICD-10-CM | POA: Diagnosis not present

## 2014-12-18 DIAGNOSIS — M19012 Primary osteoarthritis, left shoulder: Secondary | ICD-10-CM | POA: Diagnosis not present

## 2014-12-26 DIAGNOSIS — M19012 Primary osteoarthritis, left shoulder: Secondary | ICD-10-CM | POA: Diagnosis not present

## 2015-01-06 ENCOUNTER — Telehealth: Payer: Self-pay | Admitting: Family Medicine

## 2015-01-06 NOTE — Telephone Encounter (Signed)
Would not start at this time   Suggest follow up in about 3 months after her shoulder is healed and will discuss options then.

## 2015-01-06 NOTE — Telephone Encounter (Signed)
Pt informed

## 2015-01-06 NOTE — Telephone Encounter (Signed)
Pt was informed about her Bone Density results.

## 2015-01-06 NOTE — Telephone Encounter (Signed)
Pt would like to know more about alendronate (FOSAMAX) 70 MG tablet before taking.  Pt just has rotator cuff surgery and not sure she should start this med. Pt also wants to know what her bone density was

## 2015-01-16 DIAGNOSIS — Z4789 Encounter for other orthopedic aftercare: Secondary | ICD-10-CM | POA: Diagnosis not present

## 2015-01-20 DIAGNOSIS — M19012 Primary osteoarthritis, left shoulder: Secondary | ICD-10-CM | POA: Diagnosis not present

## 2015-01-20 DIAGNOSIS — S43432D Superior glenoid labrum lesion of left shoulder, subsequent encounter: Secondary | ICD-10-CM | POA: Diagnosis not present

## 2015-01-20 DIAGNOSIS — M75122 Complete rotator cuff tear or rupture of left shoulder, not specified as traumatic: Secondary | ICD-10-CM | POA: Diagnosis not present

## 2015-01-20 DIAGNOSIS — M25512 Pain in left shoulder: Secondary | ICD-10-CM | POA: Diagnosis not present

## 2015-01-22 DIAGNOSIS — M25512 Pain in left shoulder: Secondary | ICD-10-CM | POA: Diagnosis not present

## 2015-01-22 DIAGNOSIS — M19012 Primary osteoarthritis, left shoulder: Secondary | ICD-10-CM | POA: Diagnosis not present

## 2015-01-22 DIAGNOSIS — S43432D Superior glenoid labrum lesion of left shoulder, subsequent encounter: Secondary | ICD-10-CM | POA: Diagnosis not present

## 2015-01-22 DIAGNOSIS — M75122 Complete rotator cuff tear or rupture of left shoulder, not specified as traumatic: Secondary | ICD-10-CM | POA: Diagnosis not present

## 2015-01-24 DIAGNOSIS — M19012 Primary osteoarthritis, left shoulder: Secondary | ICD-10-CM | POA: Diagnosis not present

## 2015-01-24 DIAGNOSIS — M75122 Complete rotator cuff tear or rupture of left shoulder, not specified as traumatic: Secondary | ICD-10-CM | POA: Diagnosis not present

## 2015-01-24 DIAGNOSIS — S43432D Superior glenoid labrum lesion of left shoulder, subsequent encounter: Secondary | ICD-10-CM | POA: Diagnosis not present

## 2015-01-24 DIAGNOSIS — M25512 Pain in left shoulder: Secondary | ICD-10-CM | POA: Diagnosis not present

## 2015-01-27 DIAGNOSIS — M75122 Complete rotator cuff tear or rupture of left shoulder, not specified as traumatic: Secondary | ICD-10-CM | POA: Diagnosis not present

## 2015-01-27 DIAGNOSIS — M25512 Pain in left shoulder: Secondary | ICD-10-CM | POA: Diagnosis not present

## 2015-01-27 DIAGNOSIS — S43432D Superior glenoid labrum lesion of left shoulder, subsequent encounter: Secondary | ICD-10-CM | POA: Diagnosis not present

## 2015-01-27 DIAGNOSIS — M19012 Primary osteoarthritis, left shoulder: Secondary | ICD-10-CM | POA: Diagnosis not present

## 2015-01-30 DIAGNOSIS — M25512 Pain in left shoulder: Secondary | ICD-10-CM | POA: Diagnosis not present

## 2015-01-30 DIAGNOSIS — S43432D Superior glenoid labrum lesion of left shoulder, subsequent encounter: Secondary | ICD-10-CM | POA: Diagnosis not present

## 2015-01-30 DIAGNOSIS — M19012 Primary osteoarthritis, left shoulder: Secondary | ICD-10-CM | POA: Diagnosis not present

## 2015-01-30 DIAGNOSIS — M75122 Complete rotator cuff tear or rupture of left shoulder, not specified as traumatic: Secondary | ICD-10-CM | POA: Diagnosis not present

## 2015-02-03 DIAGNOSIS — M75122 Complete rotator cuff tear or rupture of left shoulder, not specified as traumatic: Secondary | ICD-10-CM | POA: Diagnosis not present

## 2015-02-03 DIAGNOSIS — M19012 Primary osteoarthritis, left shoulder: Secondary | ICD-10-CM | POA: Diagnosis not present

## 2015-02-03 DIAGNOSIS — S43432D Superior glenoid labrum lesion of left shoulder, subsequent encounter: Secondary | ICD-10-CM | POA: Diagnosis not present

## 2015-02-03 DIAGNOSIS — M25512 Pain in left shoulder: Secondary | ICD-10-CM | POA: Diagnosis not present

## 2015-02-07 DIAGNOSIS — M75122 Complete rotator cuff tear or rupture of left shoulder, not specified as traumatic: Secondary | ICD-10-CM | POA: Diagnosis not present

## 2015-02-07 DIAGNOSIS — M25512 Pain in left shoulder: Secondary | ICD-10-CM | POA: Diagnosis not present

## 2015-02-07 DIAGNOSIS — S43432D Superior glenoid labrum lesion of left shoulder, subsequent encounter: Secondary | ICD-10-CM | POA: Diagnosis not present

## 2015-02-07 DIAGNOSIS — M19012 Primary osteoarthritis, left shoulder: Secondary | ICD-10-CM | POA: Diagnosis not present

## 2015-02-10 DIAGNOSIS — S43432D Superior glenoid labrum lesion of left shoulder, subsequent encounter: Secondary | ICD-10-CM | POA: Diagnosis not present

## 2015-02-10 DIAGNOSIS — M19012 Primary osteoarthritis, left shoulder: Secondary | ICD-10-CM | POA: Diagnosis not present

## 2015-02-10 DIAGNOSIS — M25512 Pain in left shoulder: Secondary | ICD-10-CM | POA: Diagnosis not present

## 2015-02-10 DIAGNOSIS — M75122 Complete rotator cuff tear or rupture of left shoulder, not specified as traumatic: Secondary | ICD-10-CM | POA: Diagnosis not present

## 2015-02-14 DIAGNOSIS — S43432D Superior glenoid labrum lesion of left shoulder, subsequent encounter: Secondary | ICD-10-CM | POA: Diagnosis not present

## 2015-02-14 DIAGNOSIS — M19012 Primary osteoarthritis, left shoulder: Secondary | ICD-10-CM | POA: Diagnosis not present

## 2015-02-14 DIAGNOSIS — M25512 Pain in left shoulder: Secondary | ICD-10-CM | POA: Diagnosis not present

## 2015-02-14 DIAGNOSIS — M75122 Complete rotator cuff tear or rupture of left shoulder, not specified as traumatic: Secondary | ICD-10-CM | POA: Diagnosis not present

## 2015-02-17 DIAGNOSIS — Z4789 Encounter for other orthopedic aftercare: Secondary | ICD-10-CM | POA: Diagnosis not present

## 2015-02-18 DIAGNOSIS — M19012 Primary osteoarthritis, left shoulder: Secondary | ICD-10-CM | POA: Diagnosis not present

## 2015-02-18 DIAGNOSIS — S43432D Superior glenoid labrum lesion of left shoulder, subsequent encounter: Secondary | ICD-10-CM | POA: Diagnosis not present

## 2015-02-18 DIAGNOSIS — M75122 Complete rotator cuff tear or rupture of left shoulder, not specified as traumatic: Secondary | ICD-10-CM | POA: Diagnosis not present

## 2015-02-18 DIAGNOSIS — M25512 Pain in left shoulder: Secondary | ICD-10-CM | POA: Diagnosis not present

## 2015-02-21 DIAGNOSIS — M75122 Complete rotator cuff tear or rupture of left shoulder, not specified as traumatic: Secondary | ICD-10-CM | POA: Diagnosis not present

## 2015-02-21 DIAGNOSIS — M19012 Primary osteoarthritis, left shoulder: Secondary | ICD-10-CM | POA: Diagnosis not present

## 2015-02-21 DIAGNOSIS — S43432D Superior glenoid labrum lesion of left shoulder, subsequent encounter: Secondary | ICD-10-CM | POA: Diagnosis not present

## 2015-02-21 DIAGNOSIS — M25512 Pain in left shoulder: Secondary | ICD-10-CM | POA: Diagnosis not present

## 2015-02-24 DIAGNOSIS — M75122 Complete rotator cuff tear or rupture of left shoulder, not specified as traumatic: Secondary | ICD-10-CM | POA: Diagnosis not present

## 2015-02-24 DIAGNOSIS — M25512 Pain in left shoulder: Secondary | ICD-10-CM | POA: Diagnosis not present

## 2015-02-24 DIAGNOSIS — S43432D Superior glenoid labrum lesion of left shoulder, subsequent encounter: Secondary | ICD-10-CM | POA: Diagnosis not present

## 2015-02-24 DIAGNOSIS — M19012 Primary osteoarthritis, left shoulder: Secondary | ICD-10-CM | POA: Diagnosis not present

## 2015-02-27 DIAGNOSIS — M19012 Primary osteoarthritis, left shoulder: Secondary | ICD-10-CM | POA: Diagnosis not present

## 2015-02-27 DIAGNOSIS — M75122 Complete rotator cuff tear or rupture of left shoulder, not specified as traumatic: Secondary | ICD-10-CM | POA: Diagnosis not present

## 2015-02-27 DIAGNOSIS — M25512 Pain in left shoulder: Secondary | ICD-10-CM | POA: Diagnosis not present

## 2015-02-27 DIAGNOSIS — S43432D Superior glenoid labrum lesion of left shoulder, subsequent encounter: Secondary | ICD-10-CM | POA: Diagnosis not present

## 2015-03-03 DIAGNOSIS — M75122 Complete rotator cuff tear or rupture of left shoulder, not specified as traumatic: Secondary | ICD-10-CM | POA: Diagnosis not present

## 2015-03-03 DIAGNOSIS — S43432D Superior glenoid labrum lesion of left shoulder, subsequent encounter: Secondary | ICD-10-CM | POA: Diagnosis not present

## 2015-03-03 DIAGNOSIS — M25512 Pain in left shoulder: Secondary | ICD-10-CM | POA: Diagnosis not present

## 2015-03-03 DIAGNOSIS — M19012 Primary osteoarthritis, left shoulder: Secondary | ICD-10-CM | POA: Diagnosis not present

## 2015-03-07 DIAGNOSIS — M75122 Complete rotator cuff tear or rupture of left shoulder, not specified as traumatic: Secondary | ICD-10-CM | POA: Diagnosis not present

## 2015-03-07 DIAGNOSIS — M25512 Pain in left shoulder: Secondary | ICD-10-CM | POA: Diagnosis not present

## 2015-03-07 DIAGNOSIS — S43432D Superior glenoid labrum lesion of left shoulder, subsequent encounter: Secondary | ICD-10-CM | POA: Diagnosis not present

## 2015-03-07 DIAGNOSIS — M19012 Primary osteoarthritis, left shoulder: Secondary | ICD-10-CM | POA: Diagnosis not present

## 2015-03-10 DIAGNOSIS — M75122 Complete rotator cuff tear or rupture of left shoulder, not specified as traumatic: Secondary | ICD-10-CM | POA: Diagnosis not present

## 2015-03-10 DIAGNOSIS — M25512 Pain in left shoulder: Secondary | ICD-10-CM | POA: Diagnosis not present

## 2015-03-10 DIAGNOSIS — M19012 Primary osteoarthritis, left shoulder: Secondary | ICD-10-CM | POA: Diagnosis not present

## 2015-03-10 DIAGNOSIS — S43432D Superior glenoid labrum lesion of left shoulder, subsequent encounter: Secondary | ICD-10-CM | POA: Diagnosis not present

## 2015-03-14 DIAGNOSIS — M75122 Complete rotator cuff tear or rupture of left shoulder, not specified as traumatic: Secondary | ICD-10-CM | POA: Diagnosis not present

## 2015-03-14 DIAGNOSIS — M19012 Primary osteoarthritis, left shoulder: Secondary | ICD-10-CM | POA: Diagnosis not present

## 2015-03-14 DIAGNOSIS — M25512 Pain in left shoulder: Secondary | ICD-10-CM | POA: Diagnosis not present

## 2015-03-14 DIAGNOSIS — S43432D Superior glenoid labrum lesion of left shoulder, subsequent encounter: Secondary | ICD-10-CM | POA: Diagnosis not present

## 2015-03-17 DIAGNOSIS — M25512 Pain in left shoulder: Secondary | ICD-10-CM | POA: Diagnosis not present

## 2015-03-17 DIAGNOSIS — S43432D Superior glenoid labrum lesion of left shoulder, subsequent encounter: Secondary | ICD-10-CM | POA: Diagnosis not present

## 2015-03-17 DIAGNOSIS — M75122 Complete rotator cuff tear or rupture of left shoulder, not specified as traumatic: Secondary | ICD-10-CM | POA: Diagnosis not present

## 2015-03-17 DIAGNOSIS — M19012 Primary osteoarthritis, left shoulder: Secondary | ICD-10-CM | POA: Diagnosis not present

## 2015-03-21 DIAGNOSIS — M19012 Primary osteoarthritis, left shoulder: Secondary | ICD-10-CM | POA: Diagnosis not present

## 2015-03-21 DIAGNOSIS — S43432D Superior glenoid labrum lesion of left shoulder, subsequent encounter: Secondary | ICD-10-CM | POA: Diagnosis not present

## 2015-03-21 DIAGNOSIS — M75122 Complete rotator cuff tear or rupture of left shoulder, not specified as traumatic: Secondary | ICD-10-CM | POA: Diagnosis not present

## 2015-03-21 DIAGNOSIS — M25512 Pain in left shoulder: Secondary | ICD-10-CM | POA: Diagnosis not present

## 2015-03-25 DIAGNOSIS — S43432D Superior glenoid labrum lesion of left shoulder, subsequent encounter: Secondary | ICD-10-CM | POA: Diagnosis not present

## 2015-03-25 DIAGNOSIS — M25512 Pain in left shoulder: Secondary | ICD-10-CM | POA: Diagnosis not present

## 2015-03-25 DIAGNOSIS — M75122 Complete rotator cuff tear or rupture of left shoulder, not specified as traumatic: Secondary | ICD-10-CM | POA: Diagnosis not present

## 2015-03-25 DIAGNOSIS — M19012 Primary osteoarthritis, left shoulder: Secondary | ICD-10-CM | POA: Diagnosis not present

## 2015-03-27 DIAGNOSIS — M19012 Primary osteoarthritis, left shoulder: Secondary | ICD-10-CM | POA: Diagnosis not present

## 2015-03-28 DIAGNOSIS — S43432D Superior glenoid labrum lesion of left shoulder, subsequent encounter: Secondary | ICD-10-CM | POA: Diagnosis not present

## 2015-03-28 DIAGNOSIS — M19012 Primary osteoarthritis, left shoulder: Secondary | ICD-10-CM | POA: Diagnosis not present

## 2015-03-28 DIAGNOSIS — M25512 Pain in left shoulder: Secondary | ICD-10-CM | POA: Diagnosis not present

## 2015-03-28 DIAGNOSIS — M75122 Complete rotator cuff tear or rupture of left shoulder, not specified as traumatic: Secondary | ICD-10-CM | POA: Diagnosis not present

## 2015-03-31 DIAGNOSIS — M25512 Pain in left shoulder: Secondary | ICD-10-CM | POA: Diagnosis not present

## 2015-03-31 DIAGNOSIS — S43432D Superior glenoid labrum lesion of left shoulder, subsequent encounter: Secondary | ICD-10-CM | POA: Diagnosis not present

## 2015-03-31 DIAGNOSIS — M19012 Primary osteoarthritis, left shoulder: Secondary | ICD-10-CM | POA: Diagnosis not present

## 2015-03-31 DIAGNOSIS — M75122 Complete rotator cuff tear or rupture of left shoulder, not specified as traumatic: Secondary | ICD-10-CM | POA: Diagnosis not present

## 2015-04-04 DIAGNOSIS — M75122 Complete rotator cuff tear or rupture of left shoulder, not specified as traumatic: Secondary | ICD-10-CM | POA: Diagnosis not present

## 2015-04-04 DIAGNOSIS — M25512 Pain in left shoulder: Secondary | ICD-10-CM | POA: Diagnosis not present

## 2015-04-04 DIAGNOSIS — M19012 Primary osteoarthritis, left shoulder: Secondary | ICD-10-CM | POA: Diagnosis not present

## 2015-04-04 DIAGNOSIS — S43432D Superior glenoid labrum lesion of left shoulder, subsequent encounter: Secondary | ICD-10-CM | POA: Diagnosis not present

## 2015-04-07 DIAGNOSIS — M75122 Complete rotator cuff tear or rupture of left shoulder, not specified as traumatic: Secondary | ICD-10-CM | POA: Diagnosis not present

## 2015-04-07 DIAGNOSIS — M19012 Primary osteoarthritis, left shoulder: Secondary | ICD-10-CM | POA: Diagnosis not present

## 2015-04-07 DIAGNOSIS — M25512 Pain in left shoulder: Secondary | ICD-10-CM | POA: Diagnosis not present

## 2015-04-07 DIAGNOSIS — S43432D Superior glenoid labrum lesion of left shoulder, subsequent encounter: Secondary | ICD-10-CM | POA: Diagnosis not present

## 2015-04-11 DIAGNOSIS — M25512 Pain in left shoulder: Secondary | ICD-10-CM | POA: Diagnosis not present

## 2015-04-11 DIAGNOSIS — M75122 Complete rotator cuff tear or rupture of left shoulder, not specified as traumatic: Secondary | ICD-10-CM | POA: Diagnosis not present

## 2015-04-11 DIAGNOSIS — S43432D Superior glenoid labrum lesion of left shoulder, subsequent encounter: Secondary | ICD-10-CM | POA: Diagnosis not present

## 2015-04-11 DIAGNOSIS — M19012 Primary osteoarthritis, left shoulder: Secondary | ICD-10-CM | POA: Diagnosis not present

## 2015-04-14 DIAGNOSIS — M25512 Pain in left shoulder: Secondary | ICD-10-CM | POA: Diagnosis not present

## 2015-04-14 DIAGNOSIS — M75122 Complete rotator cuff tear or rupture of left shoulder, not specified as traumatic: Secondary | ICD-10-CM | POA: Diagnosis not present

## 2015-04-14 DIAGNOSIS — S43432D Superior glenoid labrum lesion of left shoulder, subsequent encounter: Secondary | ICD-10-CM | POA: Diagnosis not present

## 2015-04-14 DIAGNOSIS — M19012 Primary osteoarthritis, left shoulder: Secondary | ICD-10-CM | POA: Diagnosis not present

## 2015-04-15 ENCOUNTER — Ambulatory Visit (INDEPENDENT_AMBULATORY_CARE_PROVIDER_SITE_OTHER): Payer: Medicare Other | Admitting: Family Medicine

## 2015-04-15 DIAGNOSIS — M810 Age-related osteoporosis without current pathological fracture: Secondary | ICD-10-CM

## 2015-04-15 NOTE — Progress Notes (Signed)
Pre visit review using our clinic review tool, if applicable. No additional management support is needed unless otherwise documented below in the visit note. 

## 2015-04-15 NOTE — Progress Notes (Signed)
   Subjective:    Patient ID: [name], female    DOB: [birth year], 81 y.o.   MRN: [id number]  HPI Patient here to discuss bone density results. She has history of osteoporosis and had previous right hip fracture. She took Fosamax for several years and was eventually taken off by her gynecologist and now several years ago. She had recent DEXA scan with T score -3.6 wrist -2.9 left femoral neck. This did represent decline from previous testing. She apparently has not been on Fosamax for several years now. She does take calcium and vitamin D. She is very active and exercises fairly regularly  She thinks that at the time of her hip fracture a few years ago she was already off the Fosamax  Past Medical History  Diagnosis Date  . Diverticulosis   . Osteoporosis   . Hypertension   . Irritable bowel syndrome   . UTI (lower urinary tract infection)    Past Surgical History  Procedure Laterality Date  . Lumbar laminectomy    . Lumbar fusion    . Partial resection of colon for tumor    . Colon polyps    . Total hip arthroplasty      right  . Appendectomy      reports that she has never smoked. She does not have any smokeless tobacco history on file. She reports that she does not drink alcohol or use illicit drugs. family history includes Breast cancer in her sister; Cancer in her father; Colon cancer in her mother; Liver cancer in her brother; Lung cancer in her father; Ovarian cancer in her sister. Allergies  Allergen Reactions  . Codeine     REACTION: headaches  . Flagyl [Metronidazole Hcl]     nausea      Review of Systems  Constitutional: Negative for fatigue.  Musculoskeletal: Negative for gait problem.       Objective:   Physical Exam  Constitutional: She appears well-developed and well-nourished.  Cardiovascular: Normal rate and regular rhythm.   Pulmonary/Chest: Effort normal and breath sounds normal. No respiratory distress. She has no wheezes. She has no  rales.          Assessment & Plan:  Osteoporosis. Remote history of treatment with Fosamax. She has had progressive bone loss as well as previous osteoporosis related hip fracture. We discussed pros and cons of ongoing bisphosphonate therapy. Given the fact she's had osteoporosis related fracture and evidence for ongoing bone loss in spite of vitamin D and calcium therapy we recommend starting back Fosamax 70 mg once weekly and repeat DEXA scan in 2 years.

## 2015-04-15 NOTE — Patient Instructions (Signed)
Osteoporosis Throughout your life, your body breaks down old bone and replaces it with new bone. As you get older, your body does not replace bone as quickly as it breaks it down. By the age of 30 years, most people begin to gradually lose bone because of the imbalance between bone loss and replacement. Some people lose more bone than others. Bone loss beyond a specified normal degree is considered osteoporosis.  Osteoporosis affects the strength and durability of your bones. The inside of the ends of your bones and your flat bones, like the bones of your pelvis, look like honeycomb, filled with tiny open spaces. As bone loss occurs, your bones become less dense. This means that the open spaces inside your bones become bigger and the walls between these spaces become thinner. This makes your bones weaker. Bones of a person with osteoporosis can become so weak that they can break (fracture) during minor accidents, such as a simple fall. CAUSES  The following factors have been associated with the development of osteoporosis:  Smoking.  Drinking more than 2 alcoholic drinks several days per week.  Long-term use of certain medicines:  Corticosteroids.  Chemotherapy medicines.  Thyroid medicines.  Antiepileptic medicines.  Gonadal hormone suppression medicine.  Immunosuppression medicine.  Being underweight.  Lack of physical activity.  Lack of exposure to the sun. This can lead to vitamin D deficiency.  Certain medical conditions:  Certain inflammatory bowel diseases, such as Crohn disease and ulcerative colitis.  Diabetes.  Hyperthyroidism.  Hyperparathyroidism. RISK FACTORS Anyone can develop osteoporosis. However, the following factors can increase your risk of developing osteoporosis:  Gender--Women are at higher risk than men.  Age--Being older than 50 years increases your risk.  Ethnicity--White and Asian people have an increased risk.  Weight --Being extremely  underweight can increase your risk of osteoporosis.  Family history of osteoporosis--Having a family member who has developed osteoporosis can increase your risk. SYMPTOMS  Usually, people with osteoporosis have no symptoms.  DIAGNOSIS  Signs during a physical exam that may prompt your caregiver to suspect osteoporosis include:  Decreased height. This is usually caused by the compression of the bones that form your spine (vertebrae) because they have weakened and become fractured.  A curving or rounding of the upper back (kyphosis). To confirm signs of osteoporosis, your caregiver may request a procedure that uses 2 low-dose X-ray beams with different levels of energy to measure your bone mineral density (dual-energy X-ray absorptiometry [DXA]). Also, your caregiver may check your level of vitamin D. TREATMENT  The goal of osteoporosis treatment is to strengthen bones in order to decrease the risk of bone fractures. There are different types of medicines available to help achieve this goal. Some of these medicines work by slowing the processes of bone loss. Some medicines work by increasing bone density. Treatment also involves making sure that your levels of calcium and vitamin D are adequate. PREVENTION  There are things you can do to help prevent osteoporosis. Adequate intake of calcium and vitamin D can help you achieve optimal bone mineral density. Regular exercise can also help, especially resistance and weight-bearing activities. If you smoke, quitting smoking is an important part of osteoporosis prevention. MAKE SURE YOU:  Understand these instructions.  Will watch your condition.  Will get help right away if you are not doing well or get worse. FOR MORE INFORMATION www.osteo.org and www.nof.org Document Released: 07/21/2005 Document Revised: 02/05/2013 Document Reviewed: 09/25/2011 Exit[employer] Patient Information 2015 [employer]. This information is not   intended to replace advice  given to you by your health care provider. Make sure you discuss any questions you have with your health care provider.  

## 2015-04-18 DIAGNOSIS — M25512 Pain in left shoulder: Secondary | ICD-10-CM | POA: Diagnosis not present

## 2015-04-18 DIAGNOSIS — S43432D Superior glenoid labrum lesion of left shoulder, subsequent encounter: Secondary | ICD-10-CM | POA: Diagnosis not present

## 2015-04-18 DIAGNOSIS — M19012 Primary osteoarthritis, left shoulder: Secondary | ICD-10-CM | POA: Diagnosis not present

## 2015-04-18 DIAGNOSIS — M75122 Complete rotator cuff tear or rupture of left shoulder, not specified as traumatic: Secondary | ICD-10-CM | POA: Diagnosis not present

## 2015-04-21 DIAGNOSIS — M25512 Pain in left shoulder: Secondary | ICD-10-CM | POA: Diagnosis not present

## 2015-04-21 DIAGNOSIS — M75122 Complete rotator cuff tear or rupture of left shoulder, not specified as traumatic: Secondary | ICD-10-CM | POA: Diagnosis not present

## 2015-04-21 DIAGNOSIS — S43432D Superior glenoid labrum lesion of left shoulder, subsequent encounter: Secondary | ICD-10-CM | POA: Diagnosis not present

## 2015-04-21 DIAGNOSIS — M19012 Primary osteoarthritis, left shoulder: Secondary | ICD-10-CM | POA: Diagnosis not present

## 2015-04-24 DIAGNOSIS — L700 Acne vulgaris: Secondary | ICD-10-CM | POA: Diagnosis not present

## 2015-04-24 DIAGNOSIS — D225 Melanocytic nevi of trunk: Secondary | ICD-10-CM | POA: Diagnosis not present

## 2015-04-24 DIAGNOSIS — Z85828 Personal history of other malignant neoplasm of skin: Secondary | ICD-10-CM | POA: Diagnosis not present

## 2015-04-24 DIAGNOSIS — L821 Other seborrheic keratosis: Secondary | ICD-10-CM | POA: Diagnosis not present

## 2015-04-25 DIAGNOSIS — M25512 Pain in left shoulder: Secondary | ICD-10-CM | POA: Diagnosis not present

## 2015-04-25 DIAGNOSIS — S43432D Superior glenoid labrum lesion of left shoulder, subsequent encounter: Secondary | ICD-10-CM | POA: Diagnosis not present

## 2015-04-25 DIAGNOSIS — M19012 Primary osteoarthritis, left shoulder: Secondary | ICD-10-CM | POA: Diagnosis not present

## 2015-04-25 DIAGNOSIS — M75122 Complete rotator cuff tear or rupture of left shoulder, not specified as traumatic: Secondary | ICD-10-CM | POA: Diagnosis not present

## 2015-05-21 ENCOUNTER — Encounter: Payer: Self-pay | Admitting: Internal Medicine

## 2015-05-28 ENCOUNTER — Telehealth: Payer: Self-pay | Admitting: Family Medicine

## 2015-05-28 MED ORDER — VITAMIN D (ERGOCALCIFEROL) 1.25 MG (50000 UNIT) PO CAPS: 50000.0000 [IU] | ORAL_CAPSULE | ORAL | Status: DC

## 2015-05-28 NOTE — Telephone Encounter (Signed)
Rx sent to pharmacy   

## 2015-05-28 NOTE — Telephone Encounter (Signed)
Pt needs refill on vit d send to [facility] ,[place]

## 2015-06-19 ENCOUNTER — Telehealth: Payer: Self-pay | Admitting: Family Medicine

## 2015-06-19 ENCOUNTER — Other Ambulatory Visit: Payer: Self-pay | Admitting: Family Medicine

## 2015-06-19 DIAGNOSIS — E559 Vitamin D deficiency, unspecified: Secondary | ICD-10-CM

## 2015-06-19 NOTE — Telephone Encounter (Signed)
Her last vitamin D level was normal. I suggest we discontinue the 50,000 unit vitamin D and start over-the-counter 1000 international units once daily and recheck vitamin D level in 6 months

## 2015-06-19 NOTE — Telephone Encounter (Signed)
Patient Name: [name]  DOB: 08/19/1932    Initial Comment caller states the dr has her on vitamin d and on fosamax - is worried she is getting too much Vitamin D - is on 50,000 units   Nurse Assessment  Nurse: Mallie Mussel, RN, Alveta Heimlich Date/Time Eilene Ghazi Time): 06/19/2015 11:00:04 AM  Confirm and document reason for call. If symptomatic, describe symptoms. ---Caller states the doctor has her on vitamin D and on Fosamax - is worried she is getting too much Vitamin D - is on 50,000 units. She is taking Calcium with Vit D also. It has 750 Units with it. She takes the Calcium daily. she is concerned she is getting to much Vit D. Advised her that I will send a note to the office for someone to call her back. She state she will be out for a while and can they call back after 3pm please. Advised her that I will pass that along also. She verbalized understanding.  Has the patient traveled out of the country within the last 30 days? ---Not Applicable  Does the patient require triage? ---No     Guidelines    Guideline Title Affirmed Question Affirmed Notes       Final Disposition User

## 2015-06-19 NOTE — Telephone Encounter (Signed)
Please advise 

## 2015-06-19 NOTE — Telephone Encounter (Signed)
Pt informed. Lab is ordered.

## 2015-07-10 ENCOUNTER — Telehealth: Payer: Self-pay | Admitting: Family Medicine

## 2015-07-10 ENCOUNTER — Telehealth: Payer: Self-pay

## 2015-07-10 NOTE — Telephone Encounter (Signed)
Patient Name: [name] DOB: 04-16-1932 Initial Comment Caller states she is has been having back pain, and now her left foot is going numb. Nurse Assessment Nurse: Vallery Sa, RN, Cathy Date/Time (Eastern Time): 07/10/2015 9:48:50 AM Confirm and document reason for call. If symptomatic, describe symptoms. ---Caller states she developed middle back pain about 5 days ago and numbness in her feet (especially the left leg) today. No injury in the past 5 days. No fever. No severe breathing difficulty. Alert and responsive. Has the patient traveled out of the country within the last 30 days? ---No Does the patient require triage? ---Yes Related visit to physician within the last 2 weeks? ---No Does the PT have any chronic conditions? (i.e. diabetes, asthma, etc.) ---Yes List chronic conditions. ---Osteoporosis Guidelines Guideline Title Affirmed Question Affirmed Notes Neurologic Deficit [1] Numbness (i.e., loss of sensation) of the face, arm / hand, or leg / foot on one side of the body AND [2] sudden onset AND [3] present now Final Disposition User Call EMS 911 Now Vallery Sa, RN, Jarratt REFUSED Disagree/Comply: Disagree Disagree/Comply Reason: Disagree with instructions Called the office backline and Arbie Cookey asks that we fax the report to the office and they will notify the MD.

## 2015-07-10 NOTE — Telephone Encounter (Signed)
Pt made appt to been seen 07/11/15

## 2015-07-10 NOTE — Telephone Encounter (Signed)
Spoke with patient and patient stated that the numbness in her feet in gone and that she has been walking on her feet. Pt states that she feels fine no SOB, no pain when she is walking around. Her back is just sore and her neck pops when turns her neck.

## 2015-07-10 NOTE — Telephone Encounter (Signed)
Pt made appt to be seen on 07/10/15

## 2015-07-11 ENCOUNTER — Ambulatory Visit (INDEPENDENT_AMBULATORY_CARE_PROVIDER_SITE_OTHER): Payer: Medicare Other | Admitting: Family Medicine

## 2015-07-11 ENCOUNTER — Encounter: Payer: Self-pay | Admitting: Family Medicine

## 2015-07-11 VITALS — BP 140/80 | HR 76 | Temp 97.9°F | Ht 64.0 in | Wt 119.7 lb

## 2015-07-11 DIAGNOSIS — R202 Paresthesia of skin: Secondary | ICD-10-CM

## 2015-07-11 DIAGNOSIS — M546 Pain in thoracic spine: Secondary | ICD-10-CM

## 2015-07-11 NOTE — Patient Instructions (Signed)
Follow up for any recurrent numbness in feet or any weakness or other new concerns. Let me know if back pain persists after 2 weeks.

## 2015-07-11 NOTE — Progress Notes (Signed)
Subjective:    Patient ID: [name], female    DOB: Jun 25, 1932, 81 y.o.   MRN: [id number]  HPI  Acute visit for the following 2 new items  Patient relates yesterday while eating breakfast she noticed some numbness involving her right foot and left foot. This lasted 10 minutes in the right foot and almost 1 hour in the left foot. She denied any leg weakness or numbness and no lower extremity weakness whatsoever. No urine or stool incontinence. Denies any lumbar back pain. No symptoms whatsoever today.. Denies any recent injury.  Second issue is she has had some mild pains medial to right scapular region worse with movement. No pleuritic pain. No cough. No fevers or chills. No exertional pain. She does do some weight lifting and wonders if she may have exacerbated. Upper back pain is worse with neck movement. No radiculopathy symptoms.  Past Medical History  Diagnosis Date  . Diverticulosis   . Osteoporosis   . Hypertension   . Irritable bowel syndrome   . UTI (lower urinary tract infection)    Past Surgical History  Procedure Laterality Date  . Lumbar laminectomy    . Lumbar fusion    . Partial resection of colon for tumor    . Colon polyps    . Total hip arthroplasty      right  . Appendectomy      reports that she has never smoked. She does not have any smokeless tobacco history on file. She reports that she does not drink alcohol or use illicit drugs. family history includes Breast cancer in her sister; Cancer in her father; Colon cancer in her mother; Liver cancer in her brother; Lung cancer in her father; Ovarian cancer in her sister. Allergies  Allergen Reactions  . Codeine     REACTION: headaches  . Flagyl [Metronidazole Hcl]     nausea     Review of Systems  Constitutional: Negative for fever, chills, appetite change and unexpected weight change.  Respiratory: Negative for cough and shortness of breath.   Cardiovascular: Negative for chest pain.    Gastrointestinal: Negative for abdominal pain.  Neurological: Positive for numbness. Negative for dizziness and weakness.       Objective:   Physical Exam  Constitutional: She appears well-developed and well-nourished. No distress.  Cardiovascular: Normal rate and regular rhythm.   Pulmonary/Chest: Effort normal and breath sounds normal. No respiratory distress. She has no wheezes. She has no rales.  Abdominal: Soft. Bowel sounds are normal. She exhibits no distension. There is no tenderness. There is no rebound and no guarding.  Musculoskeletal: She exhibits no edema.  Straight leg raise is negative Minimally tender point tenderness medial right scapula. No rash. No spinal tenderness. Full range of motion both shoulders. Full range of motion cervical spine.  Neurological:  2+ reflexes knee bilaterally 1+ ankle bilaterally. Full-strength with plantar and dorsiflexion bilaterally as well as knee extension. Full sensory function throughout lower extremities          Assessment & Plan:  #1 transient numbness involving right and left foot yesterday. No symptoms today whatsoever. She denies any lumbar back pain. No reported weakness.  Nonfocal exam neurologically. We discussed further evaluation but this point since her symptoms are so transient - and with nonfocal exam we recommend observation. If recurs consider MRI lumbar spine and metabolic [id number], SPEP. #2 localized pain right thoracic area. Suspect muscular. Symptoms clearly worse with movement. At this point she will avoid upper extremity  lifting over the next week. Try some topical heat and muscle massage. Consider physical therapy if no better 2 weeks

## 2015-07-11 NOTE — Progress Notes (Signed)
Pre visit review using our clinic review tool, if applicable. No additional management support is needed unless otherwise documented below in the visit note. 

## 2015-08-03 ENCOUNTER — Other Ambulatory Visit: Payer: Self-pay | Admitting: Family Medicine

## 2015-08-05 DIAGNOSIS — Z23 Encounter for immunization: Secondary | ICD-10-CM | POA: Diagnosis not present

## 2015-09-24 DIAGNOSIS — S32592A Other specified fracture of left pubis, initial encounter for closed fracture: Secondary | ICD-10-CM | POA: Diagnosis not present

## 2015-10-02 DIAGNOSIS — S32592D Other specified fracture of left pubis, subsequent encounter for fracture with routine healing: Secondary | ICD-10-CM | POA: Diagnosis not present

## 2015-10-08 DIAGNOSIS — M25552 Pain in left hip: Secondary | ICD-10-CM | POA: Diagnosis not present

## 2015-10-08 DIAGNOSIS — R262 Difficulty in walking, not elsewhere classified: Secondary | ICD-10-CM | POA: Diagnosis not present

## 2015-10-10 DIAGNOSIS — M25552 Pain in left hip: Secondary | ICD-10-CM | POA: Diagnosis not present

## 2015-10-10 DIAGNOSIS — R262 Difficulty in walking, not elsewhere classified: Secondary | ICD-10-CM | POA: Diagnosis not present

## 2015-10-13 DIAGNOSIS — M25552 Pain in left hip: Secondary | ICD-10-CM | POA: Diagnosis not present

## 2015-10-13 DIAGNOSIS — R262 Difficulty in walking, not elsewhere classified: Secondary | ICD-10-CM | POA: Diagnosis not present

## 2015-10-17 DIAGNOSIS — R262 Difficulty in walking, not elsewhere classified: Secondary | ICD-10-CM | POA: Diagnosis not present

## 2015-10-17 DIAGNOSIS — M25552 Pain in left hip: Secondary | ICD-10-CM | POA: Diagnosis not present

## 2015-10-21 DIAGNOSIS — M25552 Pain in left hip: Secondary | ICD-10-CM | POA: Diagnosis not present

## 2015-10-21 DIAGNOSIS — R262 Difficulty in walking, not elsewhere classified: Secondary | ICD-10-CM | POA: Diagnosis not present

## 2015-10-24 DIAGNOSIS — M25552 Pain in left hip: Secondary | ICD-10-CM | POA: Diagnosis not present

## 2015-10-24 DIAGNOSIS — R262 Difficulty in walking, not elsewhere classified: Secondary | ICD-10-CM | POA: Diagnosis not present

## 2015-10-28 ENCOUNTER — Other Ambulatory Visit: Payer: Self-pay | Admitting: Family Medicine

## 2015-10-29 DIAGNOSIS — R262 Difficulty in walking, not elsewhere classified: Secondary | ICD-10-CM | POA: Diagnosis not present

## 2015-10-29 DIAGNOSIS — M25552 Pain in left hip: Secondary | ICD-10-CM | POA: Diagnosis not present

## 2015-11-06 DIAGNOSIS — S32592D Other specified fracture of left pubis, subsequent encounter for fracture with routine healing: Secondary | ICD-10-CM | POA: Diagnosis not present

## 2015-11-13 ENCOUNTER — Telehealth: Payer: Self-pay | Admitting: Family Medicine

## 2015-11-13 NOTE — Telephone Encounter (Signed)
FYI

## 2015-11-13 NOTE — Telephone Encounter (Signed)
Noted. Agree with home advice.

## 2015-11-13 NOTE — Telephone Encounter (Signed)
Patient Name: [name]  DOB: 12-11-1931    Initial Comment Caller states, her face is beet red after taking vitamins this morning    Nurse Assessment  Nurse: Mallie Mussel, RN, Alveta Heimlich Date/Time (Eastern Time): 11/13/2015 8:58:52 AM  Confirm and document reason for call. If symptomatic, describe symptoms. You must click the next button to save text entered. ---Caller states that she took Osteo Biflex with Tumeric, and Calcium Supplement, MVI. She has been taking all of these for months. This morning, her face turned red after taking her vitamins. This has not happened before. The redness of her face has pretty much gone away. She took her BP while it was red and it was 162/82. She states that she feels fine now. Current BP is 176/98 at present. She took her BP med last night like she usually does. Denies headache. She denies weakness and numbness.  Has the patient traveled out of the country within the last 30 days? ---No  Does the patient have any new or worsening symptoms? ---Yes  Will a triage be completed? ---Yes  Related visit to physician within the last 2 weeks? ---No  Does the PT have any chronic conditions? (i.e. diabetes, asthma, etc.) ---Yes  List chronic conditions. ---HTN, Osteoporosis  Is this a behavioral health or substance abuse call? ---No     Guidelines    Guideline Title Affirmed Question Affirmed Notes  High Blood Pressure [1] BP ? 140/90 AND [2] taking BP medications    Final Disposition User   See PCP within [place], RN, Alveta Heimlich    Comments  Caller states that she already has an appointment scheduled with Dr. Elease Hashimoto for 11/24/15. I went over s/s to watch for and advised her we are here 24 hours a day if she has any questions or concerns. I also recommended she start keeping a lof of her BP readings to include reading, time reading is taken and what she was doing just before taking the reading. She verbalized understanding.   Disagree/Comply: Comply

## 2015-11-24 ENCOUNTER — Encounter: Payer: Self-pay | Admitting: Family Medicine

## 2015-11-24 ENCOUNTER — Ambulatory Visit (INDEPENDENT_AMBULATORY_CARE_PROVIDER_SITE_OTHER): Payer: Medicare Other | Admitting: Family Medicine

## 2015-11-24 VITALS — BP 150/80 | HR 91 | Temp 97.6°F | Ht 64.0 in | Wt 118.0 lb

## 2015-11-24 DIAGNOSIS — E785 Hyperlipidemia, unspecified: Secondary | ICD-10-CM | POA: Diagnosis not present

## 2015-11-24 DIAGNOSIS — M810 Age-related osteoporosis without current pathological fracture: Secondary | ICD-10-CM

## 2015-11-24 DIAGNOSIS — Z0000 Encounter for general adult medical examination without abnormal findings: Secondary | ICD-10-CM | POA: Diagnosis not present

## 2015-11-24 DIAGNOSIS — I10 Essential (primary) hypertension: Secondary | ICD-10-CM | POA: Diagnosis not present

## 2015-11-24 LAB — BASIC METABOLIC PANEL
BUN: 19 mg/dL (ref 6–23)
CO2: 26 mEq/L (ref 19–32)
Calcium: 9.2 mg/dL (ref 8.4–10.5)
Chloride: 101 mEq/L (ref 96–112)
Creatinine, Ser: 0.77 mg/dL (ref 0.40–1.20)
GFR: 76.32 mL/min (ref >60.00)
Glucose, Bld: 92 mg/dL (ref 70–99)
Potassium: 3.8 mEq/L (ref 3.5–5.1)
Sodium: 137 mEq/L (ref 135–145)

## 2015-11-24 LAB — VITAMIN D 25 HYDROXY (VIT D DEFICIENCY, FRACTURES): VITD: 65.41 ng/mL (ref 30.00–100.00)

## 2015-11-24 MED ORDER — VITAMIN D (ERGOCALCIFEROL) 1.25 MG (50000 UNIT) PO CAPS: 50000.0000 [IU] | ORAL_CAPSULE | ORAL | Status: DC

## 2015-11-24 MED ORDER — GLUCOSAMINE-CHONDROITIN 250-200 MG PO TABS: 1.0000 | ORAL_TABLET | Freq: Every day | ORAL | Status: DC

## 2015-11-24 MED ORDER — CITRACAL PLUS PO TABS: 1.0000 | ORAL_TABLET | Freq: Every day | ORAL | Status: DC

## 2015-11-24 MED ORDER — FISH OIL 1000 MG PO CAPS: 2000.0000 mg | ORAL_CAPSULE | Freq: Two times a day (BID) | ORAL | Status: DC

## 2015-11-24 NOTE — Patient Instructions (Addendum)
Denosumab injection What is this medicine? DENOSUMAB (den oh sue mab) slows bone breakdown. Prolia is used to treat osteoporosis in women after menopause and in men. Xgeva is used to prevent bone fractures and other bone problems caused by cancer bone metastases. Xgeva is also used to treat giant cell tumor of the bone. This medicine may be used for other purposes; ask your health care provider or pharmacist if you have questions. What should I tell my health care provider before I take this medicine? They need to know if you have any of these conditions: -dental disease -eczema -infection or history of infections -kidney disease or on dialysis -low blood calcium or vitamin D -malabsorption syndrome -scheduled to have surgery or tooth extraction -taking medicine that contains denosumab -thyroid or parathyroid disease -an unusual reaction to denosumab, other medicines, foods, dyes, or preservatives -pregnant or trying to get pregnant -breast-feeding How should I use this medicine? This medicine is for injection under the skin. It is given by a health care professional in a hospital or clinic setting. If you are getting Prolia, a special MedGuide will be given to you by the pharmacist with each prescription and refill. Be sure to read this information carefully each time. For Prolia, talk to your pediatrician regarding the use of this medicine in children. Special care may be needed. For Xgeva, talk to your pediatrician regarding the use of this medicine in children. While this drug may be prescribed for children as young as 13 years for selected conditions, precautions do apply. Overdosage: If you think you have taken too much of this medicine contact a poison control center or emergency room at once. NOTE: This medicine is only for you. Do not share this medicine with others. What if I miss a dose? It is important not to miss your dose. Call your doctor or health care professional if you are  unable to keep an appointment. What may interact with this medicine? Do not take this medicine with any of the following medications: -other medicines containing denosumab This medicine may also interact with the following medications: -medicines that suppress the immune system -medicines that treat cancer -steroid medicines like prednisone or cortisone This list may not describe all possible interactions. Give your health care provider a list of all the medicines, herbs, non-prescription drugs, or dietary supplements you use. Also tell them if you smoke, drink alcohol, or use illegal drugs. Some items may interact with your medicine. What should I watch for while using this medicine? Visit your doctor or health care professional for regular checks on your progress. Your doctor or health care professional may order blood tests and other tests to see how you are doing. Call your doctor or health care professional if you get a cold or other infection while receiving this medicine. Do not treat yourself. This medicine may decrease your body's ability to fight infection. You should make sure you get enough calcium and vitamin D while you are taking this medicine, unless your doctor tells you not to. Discuss the foods you eat and the vitamins you take with your health care professional. See your dentist regularly. Brush and floss your teeth as directed. Before you have any dental work done, tell your dentist you are receiving this medicine. Do not become pregnant while taking this medicine or for 5 months after stopping it. Women should inform their doctor if they wish to become pregnant or think they might be pregnant. There is a potential for serious side effects   to an unborn child. Talk to your health care professional or pharmacist for more information. What side effects may I notice from receiving this medicine? Side effects that you should report to your doctor or health care professional as soon as  possible: -allergic reactions like skin rash, itching or hives, swelling of the face, lips, or tongue -breathing problems -chest pain -fast, irregular heartbeat -feeling faint or lightheaded, falls -fever, chills, or any other sign of infection -muscle spasms, tightening, or twitches -numbness or tingling -skin blisters or bumps, or is dry, peels, or red -slow healing or unexplained pain in the mouth or jaw -unusual bleeding or bruising Side effects that usually do not require medical attention (Report these to your doctor or health care professional if they continue or are bothersome.): -muscle pain -stomach upset, gas This list may not describe all possible side effects. Call your doctor for medical advice about side effects. You may report side effects to FDA at 1-800-FDA-1088. Where should I keep my medicine? This medicine is only given in a clinic, doctor's office, or other health care setting and will not be stored at home. NOTE: This sheet is a summary. It may not cover all possible information. If you have questions about this medicine, talk to your doctor, pharmacist, or health care provider.    2016, Elsevier/Gold Standard. (2012-04-10 12:37:47)  Health Maintenance  Topic Date Due  . INFLUENZA VACCINE  05/26/2015  . TETANUS/TDAP  04/02/2018  . DEXA SCAN  Completed  . ZOSTAVAX  Completed  . PNA vac Low Risk Adult  Completed

## 2015-11-24 NOTE — Progress Notes (Signed)
Subjective:    Patient ID: [name], female    DOB: 12/20/1931, 82 y.o.   MRN: [id number]  HPI Patient here for Medicare wellness visit and medical follow-up. She has history of hypertension, mild hyperlipidemia, osteoporosis. She had fall back in December with left pelvic fracture. She has recovered well. She was having difficulty ambulating for several weeks following that. She's had prior right total hip replacement.  We performed DEXA scan last year and this showed 7% decline from previous studies. She had been off Fosamax for about 2 years and we reinitiated Fosamax 70 mg weekly. She has had recent possible GERD symptoms. Frequent burping. She went off Fosamax for couple weeks and symptoms did improve somewhat. She also has occasional upper back pains when taking the Fosamax. She does take regular calcium and vitamin D. She is very active physically.  Hypertension. By monitoring blood pressures at home recently and mostly [id number] and [id number] systolic. She had one occasional reading of [id number] systolic recently. She had one day of headaches but not consistent headaches. No chest pains.  Past Medical History  Diagnosis Date  . Diverticulosis   . Osteoporosis   . Hypertension   . Irritable bowel syndrome   . UTI (lower urinary tract infection)    Past Surgical History  Procedure Laterality Date  . Lumbar laminectomy    . Lumbar fusion    . Partial resection of colon for tumor    . Colon polyps    . Total hip arthroplasty      right  . Appendectomy      reports that she has never smoked. She does not have any smokeless tobacco history on file. She reports that she does not drink alcohol or use illicit drugs. family history includes Breast cancer in her sister; Cancer in her father; Colon cancer in her mother; Liver cancer in her brother; Lung cancer in her father; Ovarian cancer in her sister. Allergies  Allergen Reactions  . Codeine     REACTION: headaches  . Flagyl  [Metronidazole Hcl]     nausea   1.  Risk factors based on Past Medical , Social, and Family history reviewed and as indicated above with no changes 2.  Limitations in physical activities None.  Fall in December with pelvic fracture but she tripped over object.  She has good balance. 3.  Depression/mood No active depression or anxiety issues 4.  Hearing No defiits 5.  ADLs independent in all. 6.  Cognitive function (orientation to time and place, language, writing, speech,memory) no short or long term memory issues.  Language and judgement intact. 7.  Home Safety no issues 8.  Height, weight, and visual acuity.all stable. 9.  Counseling discussed calcium, Vit D, weight bearing exercise, fall prevention. 10. Recommendation of preventive services. All up to date. 11. Labs based on risk factors BMP 12. Care Plan as above 13. Other Providers-none 14. Written schedule of screening/prevention services given to patient.    Review of Systems  Constitutional: Negative for fever, activity change, appetite change, fatigue and unexpected weight change.  HENT: Negative for ear pain, hearing loss, sore throat and trouble swallowing.   Eyes: Negative for visual disturbance.  Respiratory: Negative for cough and shortness of breath.   Cardiovascular: Negative for chest pain and palpitations.  Gastrointestinal: Negative for abdominal pain, diarrhea, constipation and blood in stool.  Genitourinary: Negative for dysuria and hematuria.  Musculoskeletal: Negative for myalgias, back pain and arthralgias.  Skin: Negative for rash.  Neurological: Negative for dizziness, syncope and headaches.  Hematological: Negative for adenopathy.  Psychiatric/Behavioral: Negative for confusion and dysphoric mood.       Objective:   Physical Exam  Constitutional: She is oriented to person, place, and time. She appears well-developed and well-nourished.  HENT:  Head: Normocephalic and atraumatic.  Eyes: EOM are  normal. Pupils are equal, round, and reactive to light.  Neck: Normal range of motion. Neck supple. No thyromegaly present.  Cardiovascular: Normal rate, regular rhythm and normal heart sounds.   No murmur heard. Pulmonary/Chest: Breath sounds normal. No respiratory distress. She has no wheezes. She has no rales.  Abdominal: Soft. Bowel sounds are normal. She exhibits no distension and no mass. There is no tenderness. There is no rebound and no guarding.  Musculoskeletal: Normal range of motion. She exhibits no edema.  Lymphadenopathy:    She has no cervical adenopathy.  Neurological: She is alert and oriented to person, place, and time. She displays normal reflexes. No cranial nerve deficit.  Skin: No rash noted.  Psychiatric: She has a normal mood and affect. Her behavior is normal. Judgment and thought content normal.          Assessment & Plan:  #1 Medicare wellness. Immunizations are up-to-date. She will consider continued mammograms. She would not be indicated for continued colonoscopies given her age.  #2 history of osteoporosis. Continue calcium/ vitamin D. We discussed injectable options such as Prolia or Reclast as she has not been very tolerant of oral bisphosphonates. She has not decided at this point the we gave her information on Prolia. Repeat vitamin D level  #3 hypertension. Initial blood pressure today 158/90 and repeat after rest 150/80 left arm seated. Continue close monitoring. Be in touch if consistently over 150/90. Continue Ziac with refills given. Check basic metabolic panel  #4 mild hyperlipidemia. Have not recommended further lipid panels.  Hx of good HDL and no hx of vascular disease.

## 2015-12-16 ENCOUNTER — Telehealth: Payer: Self-pay | Admitting: Family Medicine

## 2015-12-16 NOTE — Telephone Encounter (Signed)
Patient stated that she can't take the fosamax, and she would like to try the Prolia.

## 2015-12-16 NOTE — Telephone Encounter (Signed)
In your folder to sign. Per last OV, okay for her to try Prolia.

## 2015-12-17 NOTE — Telephone Encounter (Signed)
Yes.  I will get this signed.

## 2015-12-17 NOTE — Telephone Encounter (Signed)
Once signed will fax form.

## 2015-12-29 ENCOUNTER — Telehealth: Payer: Self-pay | Admitting: Family Medicine

## 2015-12-29 NOTE — Telephone Encounter (Signed)
Pt would like a call back she said she contacted her insurance company about getting the shot for osteoporosis and was told they have not been contacted by the doctors office.

## 2015-12-29 NOTE — Telephone Encounter (Signed)
Pt is aware that insurance information has been refaxed and form as well.

## 2016-01-16 ENCOUNTER — Encounter: Payer: Self-pay | Admitting: Family Medicine

## 2016-01-16 ENCOUNTER — Ambulatory Visit (INDEPENDENT_AMBULATORY_CARE_PROVIDER_SITE_OTHER): Payer: Medicare Other | Admitting: Family Medicine

## 2016-01-16 VITALS — BP 110/80 | Temp 98.0°F | Wt 119.0 lb

## 2016-01-16 DIAGNOSIS — M810 Age-related osteoporosis without current pathological fracture: Secondary | ICD-10-CM | POA: Diagnosis not present

## 2016-01-16 NOTE — Patient Instructions (Signed)
Let me know if you have not heard about the Prolia injection.

## 2016-01-16 NOTE — Progress Notes (Signed)
Pre visit review using our clinic review tool, if applicable. No additional management support is needed unless otherwise documented below in the visit note. 

## 2016-01-16 NOTE — Progress Notes (Signed)
   Subjective:    Patient ID: [name], female    DOB: Dec 20, 1931, 82 y.o.   MRN: [id number]  HPI Patient has osteoporosis. She had DEXA scan last year with T score -3.6 radius and -2.9 hip. She's had previous right hip fracture 2009 and pelvic fracture November 2016. Previously took Fosamax but had severe body aches and was intolerant. She takes regular calcium and vitamin D. Recent vitamin D level 65.    We recommended consideration for a Prolia injection. Recent calcium and creatinine levels normal. No recent falls. She exercises regularly.  Past Medical History  Diagnosis Date  . Diverticulosis   . Osteoporosis   . Hypertension   . Irritable bowel syndrome   . UTI (lower urinary tract infection)    Past Surgical History  Procedure Laterality Date  . Lumbar laminectomy    . Lumbar fusion    . Partial resection of colon for tumor    . Colon polyps    . Total hip arthroplasty      right  . Appendectomy      reports that she has never smoked. She does not have any smokeless tobacco history on file. She reports that she does not drink alcohol or use illicit drugs. family history includes Breast cancer in her sister; Cancer in her father; Colon cancer in her mother; Liver cancer in her brother; Lung cancer in her father; Ovarian cancer in her sister. Allergies  Allergen Reactions  . Codeine     REACTION: headaches  . Flagyl [Metronidazole Hcl]     nausea      Review of Systems  Constitutional: Negative for appetite change and unexpected weight change.  Respiratory: Negative for shortness of breath.   Cardiovascular: Negative for chest pain.  Gastrointestinal: Negative for abdominal pain.       Objective:   Physical Exam  Constitutional: She appears well-developed and well-nourished.  Cardiovascular: Normal rate and regular rhythm.   Pulmonary/Chest: Effort normal and breath sounds normal. No respiratory distress. She has no wheezes. She has no rales.           Assessment & Plan:  Osteoporosis. History of multiple fractures. Previous intolerance to bisphosphonates. We are waiting for approval for Prolia injections Patient will call back in 2 weeks if she has not heard anything regarding this Continue regular exercise habits and regular calcium and vitamin D. Recent electrolytes stable so when we get approval should be able to go ahead with injection.

## 2016-02-02 ENCOUNTER — Telehealth: Payer: Self-pay | Admitting: Family Medicine

## 2016-02-02 NOTE — Telephone Encounter (Signed)
I see something scanned in on 12/30/2015. Is this the information we need for her coverage or is their something else?

## 2016-02-02 NOTE — Telephone Encounter (Signed)
Pt was to let Dr Elease Hashimoto know if she had not heard anything about her prolia injection. It has been 2 weeks, and she has not heard a thing.

## 2016-02-03 NOTE — Telephone Encounter (Signed)
Patient can be scheduled for her prolia injection at this time per eligibility letter received from [facility]

## 2016-02-04 NOTE — Telephone Encounter (Signed)
Pt has been scheduled.  °

## 2016-02-04 NOTE — Telephone Encounter (Signed)
Left message for pt to call back  °

## 2016-02-09 ENCOUNTER — Telehealth: Payer: Self-pay

## 2016-02-09 ENCOUNTER — Ambulatory Visit (INDEPENDENT_AMBULATORY_CARE_PROVIDER_SITE_OTHER): Payer: Medicare Other | Admitting: Family Medicine

## 2016-02-09 DIAGNOSIS — M810 Age-related osteoporosis without current pathological fracture: Secondary | ICD-10-CM | POA: Diagnosis not present

## 2016-02-09 MED ORDER — VITAMIN D (ERGOCALCIFEROL) 1.25 MG (50000 UNIT) PO CAPS: 50000.0000 [IU] | ORAL_CAPSULE | ORAL | Status: DC

## 2016-02-09 MED ORDER — DENOSUMAB 60 MG/ML ~~LOC~~ SOLN
60.0000 mg | Freq: Once | SUBCUTANEOUS | Status: AC
  Administered 2016-02-09: 60 mg via SUBCUTANEOUS

## 2016-02-09 NOTE — Telephone Encounter (Signed)
OK to continue with the Osteo Bi-Flex and the Vit D.

## 2016-02-09 NOTE — Telephone Encounter (Signed)
Pt is aware of annotations and Vit D 50,000 units sent in to pharmacy.

## 2016-02-09 NOTE — Telephone Encounter (Signed)
Pt received her first Prolia injection today. She would like to know if she should continue to take her 50,000 unit Vitamin D as well or a lower dose of Vitamin D. She is also taking Osteo Biflex and would like to know if she can to continue this now.

## 2016-04-07 DIAGNOSIS — H5203 Hypermetropia, bilateral: Secondary | ICD-10-CM | POA: Diagnosis not present

## 2016-04-07 DIAGNOSIS — H524 Presbyopia: Secondary | ICD-10-CM | POA: Diagnosis not present

## 2016-04-07 DIAGNOSIS — H2513 Age-related nuclear cataract, bilateral: Secondary | ICD-10-CM | POA: Diagnosis not present

## 2016-05-03 ENCOUNTER — Other Ambulatory Visit: Payer: Self-pay | Admitting: Family Medicine

## 2016-05-03 NOTE — Telephone Encounter (Signed)
Rx refill sent to pharmacy. 

## 2016-05-06 DIAGNOSIS — L821 Other seborrheic keratosis: Secondary | ICD-10-CM | POA: Diagnosis not present

## 2016-05-06 DIAGNOSIS — D225 Melanocytic nevi of trunk: Secondary | ICD-10-CM | POA: Diagnosis not present

## 2016-05-06 DIAGNOSIS — D1800 Hemangioma unspecified site: Secondary | ICD-10-CM | POA: Diagnosis not present

## 2016-05-06 DIAGNOSIS — Z85828 Personal history of other malignant neoplasm of skin: Secondary | ICD-10-CM | POA: Diagnosis not present

## 2016-05-11 ENCOUNTER — Telehealth: Payer: Self-pay | Admitting: Family Medicine

## 2016-05-11 MED ORDER — BISOPROLOL-HYDROCHLOROTHIAZIDE 5-6.25 MG PO TABS: 1.0000 | ORAL_TABLET | Freq: Every day | ORAL | Status: DC

## 2016-05-11 NOTE — Telephone Encounter (Signed)
Sent medication to mail order pharmacy.

## 2016-05-11 NOTE — Telephone Encounter (Signed)
Pt wanted to let you know that she could not get Rx bisoprolol from [facility] she is able to get it from [facility] and can get it in 6 days.  Pt state that [facility] will send you a info on this.

## 2016-05-14 ENCOUNTER — Telehealth: Payer: Self-pay | Admitting: Family Medicine

## 2016-05-14 DIAGNOSIS — S80862A Insect bite (nonvenomous), left lower leg, initial encounter: Secondary | ICD-10-CM | POA: Diagnosis not present

## 2016-05-14 DIAGNOSIS — A932 Colorado tick fever: Secondary | ICD-10-CM | POA: Diagnosis not present

## 2016-05-14 NOTE — Telephone Encounter (Signed)
[facility] - Client  [place]    --------------------------------------------------------------------------------   Patient Name: [name]  Gender: Female  DOB: [birth year]   Age: 82 Y 2 M 22 D  Return Phone Number: [phone] (Primary)  Address:     City/State/Zip:  [place]     Client [facility] - Client  Client Site [facility] - Day  Physician [name] - MD  Contact Type Call  Who Is Calling Patient / Member / Family / Caregiver  Call Type Triage / Clinical  Caller Name Kelby  Relationship To Patient Self  Return Phone Number [phone] (Primary)  Chief Complaint Tick Bite  Reason for Call Symptomatic / Request for [place] states she got a Tick Bite, her husband got it off her. It's red and burns. She wants to know does she need an antibiotic.  Appointment Disposition EMR Appointment Attempted - Not Scheduled  Info pasted into Epic Yes  PreDisposition Call Doctor  Translation No       Nurse Assessment  Nurse: Amalia Hailey, RN, Melissa Date/Time (Eastern Time): 05/14/2016 3:17:36 PM  Confirm and document reason for call. If symptomatic, describe symptoms. You must click the next button to save text entered. ---Caller states she got a Tick Bite, her husband got it off her. It's red and burns. She wants to know does she need an antibiotic.    Has the patient traveled out of the country within the last 30 days? ---Not Applicable    Does the patient have any new or worsening symptoms? ---Yes    Will a triage be completed? ---Yes    Related visit to physician within the last 2 weeks? ---No    Does the PT have any chronic conditions? (i.e. diabetes, asthma, etc.) ---Yes    List chronic conditions. ---hypertension, 5-6 yrs ago hip replacement    Is this a behavioral health or substance abuse call? ---No            Guidelines          Guideline Title Affirmed Question Affirmed Notes Nurse Date/Time (Eastern Time)  Tick Bite [1] Red or very tender (to touch) area AND [2] started over 24 hours after the bite    Amalia Hailey, RN, Melissa 05/14/2016 3:19:11 PM    Disp. Time Eilene Ghazi Time) Disposition Final User    05/14/2016 3:01:05 PM Attempt made - message left   Amalia Hailey, RN, [name]      05/14/2016 3:22:52 PM See Physician within 24 Hours Yes Amalia Hailey, RN, [name]            Caller Understands: Yes  Disagree/Comply: Comply       Care Advice Given Per Guideline        SEE PHYSICIAN WITHIN 24 HOURS: LOCAL HEAT: Use warm water soaks or compresses for 15 minutes, 3 times per day. ANTIBIOTIC OINTMENT: Apply antibiotic ointment (OTC) to the infected area 3 times per day. PAIN MEDICINES: ACETAMINOPHEN (E.G., TYLENOL): CALL BACK IF: * You become worse. * Fever occurs        --------------------------------------------------------------------------------            Referrals  REFERRED TO PCP OFFICE  [facility]

## 2016-05-31 ENCOUNTER — Telehealth: Payer: Self-pay | Admitting: Family Medicine

## 2016-05-31 DIAGNOSIS — M810 Age-related osteoporosis without current pathological fracture: Secondary | ICD-10-CM

## 2016-05-31 NOTE — Telephone Encounter (Signed)
Due for Prolia October 17th.

## 2016-05-31 NOTE — Telephone Encounter (Signed)
°  Pt call to say that you told her to call in August and remind you of her PROLIA shot.

## 2016-05-31 NOTE — Telephone Encounter (Signed)
Pt is aware that she is due October 17th for her injection. She wanted to know if she needs to have her calcium and vitamin D checked on the same day. A friend of her told her that it was recommended with the injections. Please advise.

## 2016-05-31 NOTE — Telephone Encounter (Signed)
I would check them within one week PRIOR to her injection.

## 2016-06-01 NOTE — Telephone Encounter (Signed)
Orders entered---Pt is scheduled for her injection and labs.

## 2016-06-03 ENCOUNTER — Other Ambulatory Visit: Payer: Self-pay

## 2016-06-03 MED ORDER — DENOSUMAB 60 MG/ML ~~LOC~~ SOLN: 60.0000 mg | SUBCUTANEOUS | 0 refills | Status: DC

## 2016-07-26 ENCOUNTER — Ambulatory Visit (INDEPENDENT_AMBULATORY_CARE_PROVIDER_SITE_OTHER): Payer: Medicare Other | Admitting: Family Medicine

## 2016-07-26 VITALS — BP 148/80 | HR 72 | Temp 97.6°F | Ht 64.0 in | Wt 117.5 lb

## 2016-07-26 DIAGNOSIS — R300 Dysuria: Secondary | ICD-10-CM

## 2016-07-26 LAB — POCT URINALYSIS DIPSTICK
Bilirubin, UA: NEGATIVE
Glucose, UA: NEGATIVE
Ketones, UA: NEGATIVE
Nitrite, UA: NEGATIVE
Protein, UA: NEGATIVE
Spec Grav, UA: 1.01
Urobilinogen, UA: 0.2
pH, UA: 7

## 2016-07-26 MED ORDER — CEPHALEXIN 500 MG PO CAPS: 500.0000 mg | ORAL_CAPSULE | Freq: Three times a day (TID) | ORAL | 0 refills | Status: DC

## 2016-07-26 NOTE — Progress Notes (Signed)
Pre visit review using our clinic review tool, if applicable. No additional management support is needed unless otherwise documented below in the visit note. 

## 2016-07-26 NOTE — Patient Instructions (Signed)

## 2016-07-26 NOTE — Progress Notes (Signed)
Subjective:     Patient ID: [name], female   DOB: [birth year], 82 y.o.   MRN: [id number]  HPI Patient seen for acute visit with one-day history of urinary frequency and burning. No fevers or chills. No back pain. No nausea or vomiting. She has allergies to codeine and metronidazole. No recent UTI.  Past Medical History:  Diagnosis Date  . Diverticulosis   . Hypertension   . Irritable bowel syndrome   . Osteoporosis   . UTI (lower urinary tract infection)    Past Surgical History:  Procedure Laterality Date  . APPENDECTOMY    . colon polyps    . LUMBAR FUSION    . LUMBAR LAMINECTOMY    . partial resection of colon for tumor    . TOTAL HIP ARTHROPLASTY     right    reports that she has never smoked. She does not have any smokeless tobacco history on file. She reports that she does not drink alcohol or use drugs. family history includes Breast cancer in her sister; Cancer in her father; Colon cancer in her mother; Liver cancer in her brother; Lung cancer in her father; Ovarian cancer in her sister. Allergies  Allergen Reactions  . Codeine     REACTION: headaches  . Flagyl [Metronidazole Hcl]     nausea     Review of Systems  Constitutional: Negative for appetite change, chills and fever.  Gastrointestinal: Negative for abdominal pain, constipation, diarrhea, nausea and vomiting.  Genitourinary: Positive for dysuria and frequency.  Musculoskeletal: Negative for back pain.  Neurological: Negative for dizziness.       Objective:   Physical Exam  Constitutional: She appears well-developed and well-nourished.  HENT:  Head: Normocephalic and atraumatic.  Neck: Neck supple. No thyromegaly present.  Cardiovascular: Normal rate, regular rhythm and normal heart sounds.   Pulmonary/Chest: Breath sounds normal.  Abdominal: Soft. Bowel sounds are normal. There is no tenderness.       Assessment:     Dysuria. Probable UTI based on urine dipstick results and  symptoms    Plan:     -urine culture sent -Stay well-hydrated -Start Keflex 500 mg 3 times a day pending culture results  Eulas Post MD [facility]

## 2016-07-27 LAB — URINE CULTURE

## 2016-08-09 DIAGNOSIS — S32592D Other specified fracture of left pubis, subsequent encounter for fracture with routine healing: Secondary | ICD-10-CM | POA: Diagnosis not present

## 2016-08-09 DIAGNOSIS — M25561 Pain in right knee: Secondary | ICD-10-CM | POA: Diagnosis not present

## 2016-08-10 ENCOUNTER — Other Ambulatory Visit (INDEPENDENT_AMBULATORY_CARE_PROVIDER_SITE_OTHER): Payer: Medicare Other

## 2016-08-10 DIAGNOSIS — M810 Age-related osteoporosis without current pathological fracture: Secondary | ICD-10-CM | POA: Diagnosis not present

## 2016-08-10 LAB — CALCIUM: Calcium: 9.8 mg/dL (ref 8.4–10.5)

## 2016-08-10 LAB — VITAMIN D 25 HYDROXY (VIT D DEFICIENCY, FRACTURES): VITD: 80.31 ng/mL (ref 30.00–100.00)

## 2016-08-17 ENCOUNTER — Ambulatory Visit (INDEPENDENT_AMBULATORY_CARE_PROVIDER_SITE_OTHER): Payer: Medicare Other | Admitting: Family Medicine

## 2016-08-17 DIAGNOSIS — M818 Other osteoporosis without current pathological fracture: Secondary | ICD-10-CM | POA: Diagnosis not present

## 2016-08-17 MED ORDER — DENOSUMAB 60 MG/ML ~~LOC~~ SOLN
60.0000 mg | Freq: Once | SUBCUTANEOUS | Status: AC
  Administered 2016-08-17: 60 mg via SUBCUTANEOUS

## 2016-09-06 DIAGNOSIS — Z23 Encounter for immunization: Secondary | ICD-10-CM | POA: Diagnosis not present

## 2016-09-28 ENCOUNTER — Other Ambulatory Visit: Payer: Self-pay | Admitting: Family Medicine

## 2016-10-20 ENCOUNTER — Other Ambulatory Visit: Payer: Self-pay

## 2016-10-20 MED ORDER — GLUCOSAMINE-CHONDROITIN 250-200 MG PO TABS: 1.0000 | ORAL_TABLET | Freq: Every day | ORAL | 3 refills | Status: DC

## 2016-10-20 MED ORDER — CITRACAL PLUS PO TABS: 1.0000 | ORAL_TABLET | Freq: Every day | ORAL | 3 refills | Status: DC

## 2016-10-20 MED ORDER — FISH OIL 1000 MG PO CAPS: 2000.0000 mg | ORAL_CAPSULE | Freq: Two times a day (BID) | ORAL | 3 refills | Status: DC

## 2016-10-20 MED ORDER — VITAMIN D (ERGOCALCIFEROL) 1.25 MG (50000 UNIT) PO CAPS: 50000.0000 [IU] | ORAL_CAPSULE | ORAL | 3 refills | Status: DC

## 2016-11-25 ENCOUNTER — Ambulatory Visit (INDEPENDENT_AMBULATORY_CARE_PROVIDER_SITE_OTHER): Payer: Medicare Other | Admitting: Family Medicine

## 2016-11-25 VITALS — BP 150/90 | HR 73 | Temp 97.7°F | Wt 118.4 lb

## 2016-11-25 DIAGNOSIS — R300 Dysuria: Secondary | ICD-10-CM | POA: Diagnosis not present

## 2016-11-25 DIAGNOSIS — R350 Frequency of micturition: Secondary | ICD-10-CM | POA: Diagnosis not present

## 2016-11-25 LAB — POC URINALSYSI DIPSTICK (AUTOMATED)
Bilirubin, UA: NEGATIVE
Glucose, UA: NEGATIVE
Ketones, UA: NEGATIVE
Nitrite, UA: NEGATIVE
Protein, UA: NEGATIVE
Spec Grav, UA: 1.02
Urobilinogen, UA: 0.2
pH, UA: 6

## 2016-11-25 MED ORDER — CEPHALEXIN 500 MG PO CAPS: 500.0000 mg | ORAL_CAPSULE | Freq: Three times a day (TID) | ORAL | 0 refills | Status: DC

## 2016-11-25 NOTE — Progress Notes (Signed)
Pre visit review using our clinic review tool, if applicable. No additional management support is needed unless otherwise documented below in the visit note. 

## 2016-11-25 NOTE — Addendum Note (Signed)
Addended by: Tomi Likens on: 11/25/2016 01:56 PM   Modules accepted: Orders

## 2016-11-25 NOTE — Progress Notes (Signed)
Subjective:    Patient ID: [name], female    DOB: 01-Mar-1932, 83 y.o.   MRN: [id number]  HPI  Ms. Corvera is an 83 year old female who presents today with urinary frequency for 3 days. Associated urinary  burning. She denies fever, chills, sweats, hematuria, N/V, and back pain. Last UTI noted in 07/26/16. Treatment at home with water and cranberry juice has improved symptoms.   Review of Systems  Constitutional: Negative for chills, fatigue and fever.  Respiratory: Negative for cough and wheezing.   Cardiovascular: Negative for chest pain and palpitations.  Gastrointestinal: Negative for abdominal pain, diarrhea, nausea and vomiting.  Genitourinary: Positive for dysuria, frequency and urgency. Negative for hematuria.   Past Medical History:  Diagnosis Date  . Diverticulosis   . Hypertension   . Irritable bowel syndrome   . Osteoporosis   . UTI (lower urinary tract infection)      Social History   Social History  . Marital status: Married    Spouse name: N/A  . Number of children: N/A  . Years of education: N/A   Occupational History  . retired Retired   Social History Main Topics  . Smoking status: Never Smoker  . Smokeless tobacco: Not on file  . Alcohol use No  . Drug use: No  . Sexual activity: Not Currently   Other Topics Concern  . Not on file   Social History Narrative  . No narrative on file    Past Surgical History:  Procedure Laterality Date  . APPENDECTOMY    . colon polyps    . LUMBAR FUSION    . LUMBAR LAMINECTOMY    . partial resection of colon for tumor    . TOTAL HIP ARTHROPLASTY     right    Family History  Problem Relation Age of Onset  . Colon cancer Mother   . Cancer Father   . Lung cancer Father   . Ovarian cancer Sister   . Breast cancer Sister   . Liver cancer Brother     Allergies  Allergen Reactions  . Codeine     REACTION: headaches  . Flagyl [Metronidazole Hcl]     nausea    Current Outpatient  Prescriptions on File Prior to Visit  Medication Sig Dispense Refill  . b complex vitamins tablet Take 1 tablet by mouth daily.      . bisoprolol-hydrochlorothiazide (ZIAC) 5-6.25 MG tablet TAKE 1 TABLET EVERY DAY 90 tablet 3  . cephALEXin (KEFLEX) 500 MG capsule Take 1 capsule (500 mg total) by mouth 3 (three) times daily. 21 capsule 0  . denosumab (PROLIA) 60 MG/ML SOLN injection Inject 60 mg into the skin every 6 (six) months. Administer in upper arm, thigh, or abdomen 1.8 mL 0  . Glucosamine-Chondroitin 250-200 MG TABS Take 1 tablet by mouth daily. 90 each 3  . hydrocortisone-pramoxine (ANALPRAM-HC) 2.5-1 % rectal cream Place rectally 2 (two) times daily. 30 g 3  . meloxicam (MOBIC) 15 MG tablet Take 1 tablet (15 mg total) by mouth daily as needed. 30 tablet 5  . Multiple Minerals-Vitamins (CITRACAL PLUS) TABS Take 1 tablet by mouth daily. 90 tablet 3  . Omega-3 Fatty Acids (FISH OIL) 1000 MG CAPS Take 2 capsules (2,000 mg total) by mouth 2 (two) times daily. 120 capsule 3  . Specialty Vitamins Products (VITAMINS FOR HAIR PO) Take by mouth.    . Vitamin D, Ergocalciferol, (DRISDOL) 50000 units CAPS capsule Take 1 capsule (50,000 Units total)  by mouth every 7 (seven) days. 12 capsule 3   No current facility-administered medications on file prior to visit.     BP (!) 150/90 (BP Location: Left Arm, Patient Position: Sitting, Cuff Size: Normal)   Pulse 73   Temp 97.7 F (36.5 C) (Oral)   Wt 118 lb 6.4 oz (53.7 kg)   SpO2 98%   BMI 20.32 kg/m       Objective:   Physical Exam  Constitutional: She is oriented to person, place, and time. She appears well-developed and well-nourished.  Eyes: Pupils are equal, round, and reactive to light. No scleral icterus.  Cardiovascular: Normal rate and regular rhythm.   Pulmonary/Chest: Effort normal and breath sounds normal. She has no wheezes. She has no rales.  Abdominal: Soft. Bowel sounds are normal. There is no tenderness. There is no rebound  and no CVA tenderness.  Neurological: She is alert and oriented to person, place, and time.  Skin: Skin is warm and dry. No rash noted.  Psychiatric: She has a normal mood and affect. Her behavior is normal. Judgment and thought content normal.        Assessment & Plan:  1. Dysuria UA: 1+ leukocytes and blood. Exam is reassuring today. Will treat and send urine for culture. - cephALEXin (KEFLEX) 500 MG capsule; Take 1 capsule (500 mg total) by mouth 3 (three) times daily.  Dispense: 21 capsule; Refill: 0  2. Frequent urination - POCT Urinalysis Dipstick (Automated)  Advised supportive measures of hydration. Follow up for wellness visit and routine physical.   Delano Metz, FNP-C

## 2016-11-25 NOTE — Patient Instructions (Signed)
Please take medication as directed and follow up if symptoms do not improve in 3 to 4 days, worsen, or you develop a fever >100, back pain, nausea or vomiting.   Urinary Tract Infection, Adult A urinary tract infection (UTI) is an infection of any part of the urinary tract, which includes the kidneys, ureters, bladder, and urethra. These organs make, store, and get rid of urine in the body. UTI can be a bladder infection (cystitis) or kidney infection (pyelonephritis). What are the causes? This infection may be caused by fungi, viruses, or bacteria. Bacteria are the most common cause of UTIs. This condition can also be caused by repeated incomplete emptying of the bladder during urination. What increases the risk? This condition is more likely to develop if:  You ignore your need to urinate or hold urine for long periods of time.  You do not empty your bladder completely during urination.  You wipe back to front after urinating or having a bowel movement, if you are female.  You are uncircumcised, if you are female.  You are constipated.  You have a urinary catheter that stays in place (indwelling).  You have a weak defense (immune) system.  You have a medical condition that affects your bowels, kidneys, or bladder.  You have diabetes.  You take antibiotic medicines frequently or for long periods of time, and the antibiotics no longer work well against certain types of infections (antibiotic resistance).  You take medicines that irritate your urinary tract.  You are exposed to chemicals that irritate your urinary tract.  You are female. What are the signs or symptoms? Symptoms of this condition include:  Fever.  Frequent urination or passing small amounts of urine frequently.  Needing to urinate urgently.  Pain or burning with urination.  Urine that smells bad or unusual.  Cloudy urine.  Pain in the lower abdomen or back.  Trouble urinating.  Blood in the  urine.  Vomiting or being less hungry than normal.  Diarrhea or abdominal pain.  Vaginal discharge, if you are female. How is this diagnosed? This condition is diagnosed with a medical history and physical exam. You will also need to provide a urine sample to test your urine. Other tests may be done, including:  Blood tests.  Sexually transmitted disease (STD) testing. If you have had more than one UTI, a cystoscopy or imaging studies may be done to determine the cause of the infections. How is this treated? Treatment for this condition often includes a combination of two or more of the following:  Antibiotic medicine.  Other medicines to treat less common causes of UTI.  Over-the-counter medicines to treat pain.  Drinking enough water to stay hydrated. Follow these instructions at home:  Take over-the-counter and prescription medicines only as told by your health care provider.  If you were prescribed an antibiotic, take it as told by your health care provider. Do not stop taking the antibiotic even if you start to feel better.  Avoid alcohol, caffeine, tea, and carbonated beverages. They can irritate your bladder.  Drink enough fluid to keep your urine clear or pale yellow.  Keep all follow-up visits as told by your health care provider. This is important.  Make sure to:  Empty your bladder often and completely. Do not hold urine for long periods of time.  Empty your bladder before and after sex.  Wipe from front to back after a bowel movement if you are female. Use each tissue one time when you  wipe. Contact a health care provider if:  You have back pain.  You have a fever.  You feel nauseous or vomit.  Your symptoms do not get better after 3 days.  Your symptoms go away and then return. Get help right away if:  You have severe back pain or lower abdominal pain.  You are vomiting and cannot keep down any medicines or water. This information is not  intended to replace advice given to you by your health care provider. Make sure you discuss any questions you have with your health care provider. Document Released: 07/21/2005 Document Revised: 03/24/2016 Document Reviewed: 09/01/2015 Elsevier Interactive Patient Education  2017 [employer].

## 2016-11-26 LAB — URINE CULTURE: Organism ID, Bacteria: NO GROWTH

## 2016-11-30 NOTE — Progress Notes (Addendum)
Subjective:   [name] is a 83 y.o. female who presents for Medicare Annual (Subsequent) preventive examination.  Medicare Annual Preventive Care Visit - Subsequent Last OV - recent for UTI  Describes Health as poor, fair, good or great? Good  Thinks she has a hernia; budging in left inguinal area Will make an apt with Dr. Elease Hashimoto  States  BP recently  elevated due to cold med  HR irreg this am; states she is not symptomatic of sob, chest pain, dizziness, etc.  To note not on hx; 3 brothers and 2 sister all had Heart attacks  Added note to history and she agreed to fup with Dr. Elease Hashimoto   BMI 19  Diet :  Breakfast; usually an egg/ tomato and toast Lunch; eat 1/2 sandwich Supper: cooks dinner spaghetti bake; good square meal  Drinks a lot of water   Exercise; goes to shapes; Used to be [employer]; 2 times a week  Has a gym in her home but does not use it often  Dental; yes   1.) Patient-completed HRA during the assessment today   2.) Review of Medical History: -PMH, PSH, Family History and current specialty and care providers reviewed and updated and listed below  - see scanned in document in chart and below  Had 8 brothers and sisters  1 sister left and 2 brothers   Social History   Social History  . Marital status: Married    Spouse name: N/A  . Number of children: N/A  . Years of education: N/A   Occupational History  . retired Retired   Social History Main Topics  . Smoking status: Never Smoker  . Smokeless tobacco: Current User  . Alcohol use No  . Drug use: No  . Sexual activity: Not Currently   Other Topics Concern  . Not on file   Social History Narrative   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   Married x 61 years   1 child   2 grand and 1.5 great grands     Family History  Problem Relation Age of Onset  . Colon cancer Mother   . Cancer Father   . Lung cancer Father   . Ovarian cancer Sister   . Breast cancer Sister   .  Liver cancer Brother   . Heart disease Brother     noted on hx that 3 brothers and 2 sister had MI   Medical issues that coincide with lifestyle:    3.) Review of functional ability and level of safety: See Medicare screens for hearing; vision; falls; IADLs and ADLs,  Safety information given for community, home and personal safety;   Stressors: no, had BP issues a week ago but better now   General: alert, appear well hydrated and in no acute distress   Mood stable; attentive;   See patient instructions for recommendations.  4)The following written screening schedule of preventive measures were reviewed with assessment and plan made per below, orders and patient instructions:   AAA screening done if applicable/ never smoked   Alcohol screening done/ no use   Tobacco Screening done ; none  Vaccines:  (PPSV23 -one dose after 64, one before if risk factors),  Prevnar 13 - one does post 65 Influenza -   was completed 2017 at [facility] Tetanus q 10 years or Tdap if not taken  Falls safety; had fall due to bending over and getting up to quickly  Discussed falls   Screening mammograph (yearly  if >4/0) 09/2013   Screening Pap smear/pelvic exam (q2 years) 2007  ASSESSMENT/PLAN: sister had ovarian cancer and breast cancer    Colorectal cancer screening: colonoscopy q10y or colo-guard reviewed  (family + colon cancer)  Colonoscopy  06/2010 aged out    Bone mass measurements(covered q2y/ 11/2014 Positive  -2.9 / taking prolia  Calcium supplements equaling 1200 per day and Vit d    Screening for glaucoma(q1y if high risk - diabetes, FH, AA and > 50 or hispanic and > 65) ASSESSMENT/PLAN: no issues; vision checks annually    Cardiovascular screening blood tests (lipids q5y) 04/2013; HDL 58 and Trig 95;  FBS 92 irreg pulse today; no symptoms, chest pain, sob; tightness or other, No dizzines ; to fup with Dr. Elease Hashimoto   5) Summary: -risk factors and conditions per above  assessment were discussed and treatment, recommendations and referrals were offered per documentation above and orders and patient instructions.  Education and counseling regarding the above review of health provided with a plan for the following: -fall prevention strategies discussed -personal and community safety -healthy lifestyle discussed (weight loss, exercise, etc_  -importance and resources for completing advanced directives discussed has been completed  -see patient instructions below for any other recommendations provided         Objective:     Vitals: BP 140/70   Pulse 65   Ht 5\' 5"  (1.651 m)   Wt 119 lb 4 oz (54.1 kg)   SpO2 97%   BMI 19.84 kg/m   Body mass index is 19.84 kg/m.   Tobacco History  Smoking Status  . Never Smoker  Smokeless Tobacco  . Current User     Ready to quit: Not Answered Counseling given: Yes   Past Medical History:  Diagnosis Date  . Diverticulosis   . Hypertension   . Irritable bowel syndrome   . Osteoporosis   . UTI (lower urinary tract infection)    Past Surgical History:  Procedure Laterality Date  . APPENDECTOMY    . colon polyps    . LUMBAR FUSION    . LUMBAR LAMINECTOMY    . partial resection of colon for tumor    . TOTAL HIP ARTHROPLASTY     right   Family History  Problem Relation Age of Onset  . Colon cancer Mother   . Cancer Father   . Lung cancer Father   . Ovarian cancer Sister   . Breast cancer Sister   . Liver cancer Brother   . Heart disease Brother    History  Sexual Activity  . Sexual activity: Not Currently    Outpatient Encounter Prescriptions as of 12/01/2016  Medication Sig  . b complex vitamins tablet Take 1 tablet by mouth daily.    . bisoprolol-hydrochlorothiazide (ZIAC) 5-6.25 MG tablet TAKE 1 TABLET EVERY DAY  . cephALEXin (KEFLEX) 500 MG capsule Take 1 capsule (500 mg total) by mouth 3 (three) times daily.  Marland Kitchen denosumab (PROLIA) 60 MG/ML SOLN injection Inject 60 mg into the skin  every 6 (six) months. Administer in upper arm, thigh, or abdomen  . Glucosamine-Chondroitin 250-200 MG TABS Take 1 tablet by mouth daily.  . hydrocortisone-pramoxine (ANALPRAM-HC) 2.5-1 % rectal cream Place rectally 2 (two) times daily.  . meloxicam (MOBIC) 15 MG tablet Take 1 tablet (15 mg total) by mouth daily as needed.  . Multiple Minerals-Vitamins (CITRACAL PLUS) TABS Take 1 tablet by mouth daily.  . Omega-3 Fatty Acids (FISH OIL) 1000 MG CAPS Take 2 capsules (2,000  mg total) by mouth 2 (two) times daily.  Marland Kitchen Specialty Vitamins Products (VITAMINS FOR HAIR PO) Take by mouth.  . Vitamin D, Ergocalciferol, (DRISDOL) 50000 units CAPS capsule Take 1 capsule (50,000 Units total) by mouth every 7 (seven) days.   No facility-administered encounter medications on file as of 12/01/2016.     Activities of Daily Living In your present state of health, do you have any difficulty performing the following activities: 12/01/2016  Hearing? N  Vision? N  Difficulty concentrating or making decisions? N  Walking or climbing stairs? N  Dressing or bathing? N  Doing errands, shopping? N  Preparing Food and eating ? N  Using the Toilet? N  Do you have problems with loss of bowel control? N  Managing your Medications? N  Managing your Finances? N  Housekeeping or managing your Housekeeping? N  Some recent data might be hidden    Patient Care Team: Eulas Post, MD as PCP - General (Family Medicine)    Assessment:   Exercise Activities and Dietary recommendations    Goals    . stay independent          Keep exercising, stay engaged in activities and food banks Keep volunteering       Fall Risk Fall Risk  12/01/2016 11/24/2015 11/24/2015 11/22/2014 09/14/2014  Falls in the past year? Yes Yes Yes No No  Number falls in past yr: 1 1 1  - -  Injury with Fall? - Yes - - -  Follow up Education provided Falls prevention discussed Falls evaluation completed;Education provided;Falls prevention  discussed - -  Some recent data might be hidden   Depression Screen PHQ 2/9 Scores 12/01/2016 11/24/2015 11/24/2015 11/22/2014  PHQ - 2 Score 0 0 0 0  Some recent data might be hidden     Cognitive Function Ad8 score is 0      6CIT Screen 12/01/2016  What Year? 0 points  What month? 0 points  What time? 0 points  Count back from 20 0 points  Months in reverse 0 points  Repeat phrase 2 points  Total Score 2  Some recent data might be hidden    Immunization History  Administered Date(s) Administered  . H1N1 10/02/2008  . Influenza Split 09/30/2011, 10/10/2012  . Influenza Whole 09/04/2007, 07/30/2008, 10/08/2009, 09/11/2010  . Influenza,inj,Quad PF,36+ Mos 07/11/2013  . Influenza-Unspecified 09/11/2014, 08/25/2016  . Pneumococcal Conjugate-13 11/22/2014  . Pneumococcal Polysaccharide-23 12/04/2007  . Td 04/02/2008  . Zoster 11/26/2013   Screening Tests Health Maintenance  Topic Date Due  . INFLUENZA VACCINE  05/25/2016  . TETANUS/TDAP  04/02/2018  . DEXA SCAN  Completed  . ZOSTAVAX  Completed  . PNA vac Low Risk Adult  Completed      Plan:      PCP Notes  Health Maintenance Had flu vaccine at [facility]  Discussed repeat mammogram but is hesitant even with family hx  Abnormal Screens  Occasional irreg HR this am; but denies s/s of issues;  BP 140/70 but states elevated recently to 180 this past week; the patient felt this was due to otc med for cold   Also recent episode of dysuria and still taking antibiotic    Referrals   Patient concerns; has suspected inguinal hernia on left side; noted to the patient x 1 weeks ago  To fup with Dr. Elease Hashimoto for acute visit when leaving   Nurse Concerns; to make apt with Dr. Elease Hashimoto to fup on all the above (BP recheck; possible  hernia; Heart rate and resolution of recent UTI )   Next PCP apt 02/20/ at 9:30    During the course of the visit the patient was educated and counseled about the following appropriate screening  and preventive services:   Vaccines to include Pneumoccal, Influenza, Hepatitis B, Td, Zostavax, HCV  Electrocardiogram  Cardiovascular Disease  Colorectal cancer screening  Bone density screening  Diabetes screening  Glaucoma screening  Mammography/PAP  Nutrition counseling   Patient Instructions (the written plan) was given to the patient.   [id number], RN  12/01/2016  Agree with assessment above.  Pt has pending follow up with me and will assess for hernia then.  Eulas Post MD [facility]

## 2016-12-01 ENCOUNTER — Ambulatory Visit (INDEPENDENT_AMBULATORY_CARE_PROVIDER_SITE_OTHER): Payer: Medicare Other

## 2016-12-01 VITALS — BP 140/70 | HR 65 | Ht 65.0 in | Wt 119.2 lb

## 2016-12-01 DIAGNOSIS — Z0000 Encounter for general adult medical examination without abnormal findings: Secondary | ICD-10-CM

## 2016-12-01 NOTE — Patient Instructions (Addendum)
[name] , Thank you for taking time to come for your Medicare Wellness Visit. I appreciate your ongoing commitment to your health goals. Please review the following plan we discussed and let me know if I can assist you in the future.   Last mammogram 09/2013 will consider having another due to family hx  Put your flu vaccine in epic  Fup with Dr. Elease Hashimoto to check ua if still bothering you, BP; irreg HR and suspected hernia on left     These are the goals we discussed: Goals    . stay independent          Keep exercising, stay engaged in activities and food banks Keep volunteering        This is a list of the screening recommended for you and due dates:  Health Maintenance  Topic Date Due  . Flu Shot  05/25/2016  . Tetanus Vaccine  04/02/2018  . DEXA scan (bone density measurement)  Completed  . Shingles Vaccine  Completed  . Pneumonia vaccines  Completed        Fall Prevention in the Home Introduction Falls can cause injuries. They can happen to people of all ages. There are many things you can do to make your home safe and to help prevent falls. What can I do on the outside of my home?  Regularly fix the edges of walkways and driveways and fix any cracks.  Remove anything that might make you trip as you walk through a door, such as a raised step or threshold.  Trim any bushes or trees on the path to your home.  Use bright outdoor lighting.  Clear any walking paths of anything that might make someone trip, such as rocks or tools.  Regularly check to see if handrails are loose or broken. Make sure that both sides of any steps have handrails.  Any raised decks and porches should have guardrails on the edges.  Have any leaves, snow, or ice cleared regularly.  Use sand or salt on walking paths during winter.  Clean up any spills in your garage right away. This includes oil or grease spills. What can I do in the bathroom?  Use night  lights.  Install grab bars by the toilet and in the tub and shower. Do not use towel bars as grab bars.  Use non-skid mats or decals in the tub or shower.  If you need to sit down in the shower, use a plastic, non-slip stool.  Keep the floor dry. Clean up any water that spills on the floor as soon as it happens.  Remove soap buildup in the tub or shower regularly.  Attach bath mats securely with double-sided non-slip rug tape.  Do not have throw rugs and other things on the floor that can make you trip. What can I do in the bedroom?  Use night lights.  Make sure that you have a light by your bed that is easy to reach.  Do not use any sheets or blankets that are too big for your bed. They should not hang down onto the floor.  Have a firm chair that has side arms. You can use this for support while you get dressed.  Do not have throw rugs and other things on the floor that can make you trip. What can I do in the kitchen?  Clean up any spills right away.  Avoid walking on wet floors.  Keep items that you use a lot in easy-to-reach places.  If you need to reach something above you, use a strong step stool that has a grab bar.  Keep electrical cords out of the way.  Do not use floor polish or wax that makes floors slippery. If you must use wax, use non-skid floor wax.  Do not have throw rugs and other things on the floor that can make you trip. What can I do with my stairs?  Do not leave any items on the stairs.  Make sure that there are handrails on both sides of the stairs and use them. Fix handrails that are broken or loose. Make sure that handrails are as long as the stairways.  Check any carpeting to make sure that it is firmly attached to the stairs. Fix any carpet that is loose or worn.  Avoid having throw rugs at the top or bottom of the stairs. If you do have throw rugs, attach them to the floor with carpet tape.  Make sure that you have a light switch at the  top of the stairs and the bottom of the stairs. If you do not have them, ask someone to add them for you. What else can I do to help prevent falls?  Wear shoes that:  Do not have high heels.  Have rubber bottoms.  Are comfortable and fit you well.  Are closed at the toe. Do not wear sandals.  If you use a stepladder:  Make sure that it is fully opened. Do not climb a closed stepladder.  Make sure that both sides of the stepladder are locked into place.  Ask someone to hold it for you, if possible.  Clearly mark and make sure that you can see:  Any grab bars or handrails.  First and last steps.  Where the edge of each step is.  Use tools that help you move around (mobility aids) if they are needed. These include:  Canes.  Walkers.  Scooters.  Crutches.  Turn on the lights when you go into a dark area. Replace any light bulbs as soon as they burn out.  Set up your furniture so you have a clear path. Avoid moving your furniture around.  If any of your floors are uneven, fix them.  If there are any pets around you, be aware of where they are.  Review your medicines with your doctor. Some medicines can make you feel dizzy. This can increase your chance of falling. Ask your doctor what other things that you can do to help prevent falls. This information is not intended to replace advice given to you by your health care provider. Make sure you discuss any questions you have with your health care provider. Document Released: 08/07/2009 Document Revised: 03/18/2016 Document Reviewed: 11/15/2014  2017 [employer]  Health Maintenance, Female Introduction Adopting a healthy lifestyle and getting preventive care can go a long way to promote health and wellness. Talk with your health care provider about what schedule of regular examinations is right for you. This is a good chance for you to check in with your provider about disease prevention and staying healthy. In between  checkups, there are plenty of things you can do on your own. Experts have done a lot of research about which lifestyle changes and preventive measures are most likely to keep you healthy. Ask your health care provider for more information. Weight and diet Eat a healthy diet  Be sure to include plenty of vegetables, fruits, low-fat dairy products, and lean protein.  Do not eat  a lot of foods high in solid fats, added sugars, or salt.  Get regular exercise. This is one of the most important things you can do for your health.  Most adults should exercise for at least 150 minutes each week. The exercise should increase your heart rate and make you sweat (moderate-intensity exercise).  Most adults should also do strengthening exercises at least twice a week. This is in addition to the moderate-intensity exercise. Maintain a healthy weight  Body mass index (BMI) is a measurement that can be used to identify possible weight problems. It estimates body fat based on height and weight. Your health care provider can help determine your BMI and help you achieve or maintain a healthy weight.  For females 9 years of age and older:  A BMI below 18.5 is considered underweight.  A BMI of 18.5 to 24.9 is normal.  A BMI of 25 to 29.9 is considered overweight.  A BMI of 30 and above is considered obese. Watch levels of cholesterol and blood lipids  You should start having your blood tested for lipids and cholesterol at 81 years of age, then have this test every 5 years.  You may need to have your cholesterol levels checked more often if:  Your lipid or cholesterol levels are high.  You are older than 81 years of age.  You are at high risk for heart disease. Cancer screening Lung Cancer  Lung cancer screening is recommended for adults 76-41 years old who are at high risk for lung cancer because of a history of smoking.  A yearly low-dose CT scan of the lungs is recommended for people  who:  Currently smoke.  Have quit within the past 15 years.  Have at least a 30-pack-year history of smoking. A pack year is smoking an average of one pack of cigarettes a day for 1 year.  Yearly screening should continue until it has been 15 years since you quit.  Yearly screening should stop if you develop a health problem that would prevent you from having lung cancer treatment. Breast Cancer  Practice breast self-awareness. This means understanding how your breasts normally appear and feel.  It also means doing regular breast self-exams. Let your health care provider know about any changes, no matter how small.  If you are in your 20s or 30s, you should have a clinical breast exam (CBE) by a health care provider every 1-3 years as part of a regular health exam.  If you are 54 or older, have a CBE every year. Also consider having a breast X-ray (mammogram) every year.  If you have a family history of breast cancer, talk to your health care provider about genetic screening.  If you are at high risk for breast cancer, talk to your health care provider about having an MRI and a mammogram every year.  Breast cancer gene (BRCA) assessment is recommended for women who have family members with BRCA-related cancers. BRCA-related cancers include:  Breast.  Ovarian.  Tubal.  Peritoneal cancers.  Results of the assessment will determine the need for genetic counseling and BRCA1 and BRCA2 testing. Cervical Cancer  Your health care provider may recommend that you be screened regularly for cancer of the pelvic organs (ovaries, uterus, and vagina). This screening involves a pelvic examination, including checking for microscopic changes to the surface of your cervix (Pap test). You may be encouraged to have this screening done every 3 years, beginning at age 60.  For women ages 46-65, health  care providers may recommend pelvic exams and Pap testing every 3 years, or they may recommend the  Pap and pelvic exam, combined with testing for human papilloma virus (HPV), every 5 years. Some types of HPV increase your risk of cervical cancer. Testing for HPV may also be done on women of any age with unclear Pap test results.  Other health care providers may not recommend any screening for nonpregnant women who are considered low risk for pelvic cancer and who do not have symptoms. Ask your health care provider if a screening pelvic exam is right for you.  If you have had past treatment for cervical cancer or a condition that could lead to cancer, you need Pap tests and screening for cancer for at least 20 years after your treatment. If Pap tests have been discontinued, your risk factors (such as having a new sexual partner) need to be reassessed to determine if screening should resume. Some women have medical problems that increase the chance of getting cervical cancer. In these cases, your health care provider may recommend more frequent screening and Pap tests. Colorectal Cancer  This type of cancer can be detected and often prevented.  Routine colorectal cancer screening usually begins at 80 years of age and continues through 81 years of age.  Your health care provider may recommend screening at an earlier age if you have risk factors for colon cancer.  Your health care provider may also recommend using home test kits to check for hidden blood in the stool.  A small camera at the end of a tube can be used to examine your colon directly (sigmoidoscopy or colonoscopy). This is done to check for the earliest forms of colorectal cancer.  Routine screening usually begins at age 21.  Direct examination of the colon should be repeated every 5-10 years through 81 years of age. However, you may need to be screened more often if early forms of precancerous polyps or small growths are found. Skin Cancer  Check your skin from head to toe regularly.  Tell your health care provider about any new  moles or changes in moles, especially if there is a change in a mole's shape or color.  Also tell your health care provider if you have a mole that is larger than the size of a pencil eraser.  Always use sunscreen. Apply sunscreen liberally and repeatedly throughout the day.  Protect yourself by wearing long sleeves, pants, a wide-brimmed hat, and sunglasses whenever you are outside. Heart disease, diabetes, and high blood pressure  High blood pressure causes heart disease and increases the risk of stroke. High blood pressure is more likely to develop in:  People who have blood pressure in the high end of the normal range (130-139/85-89 mm Hg).  People who are overweight or obese.  People who are African American.  If you are 96-74 years of age, have your blood pressure checked every 3-5 years. If you are 24 years of age or older, have your blood pressure checked every year. You should have your blood pressure measured twice-once when you are at a hospital or clinic, and once when you are not at a hospital or clinic. Record the average of the two measurements. To check your blood pressure when you are not at a hospital or clinic, you can use:  An automated blood pressure machine at a pharmacy.  A home blood pressure monitor.  If you are between 37 years and 28 years old, ask your health care  provider if you should take aspirin to prevent strokes.  Have regular diabetes screenings. This involves taking a blood sample to check your fasting blood sugar level.  If you are at a normal weight and have a low risk for diabetes, have this test once every three years after 81 years of age.  If you are overweight and have a high risk for diabetes, consider being tested at a younger age or more often. Preventing infection Hepatitis B  If you have a higher risk for hepatitis B, you should be screened for this virus. You are considered at high risk for hepatitis B if:  You were born in a country  where hepatitis B is common. Ask your health care provider which countries are considered high risk.  Your parents were born in a high-risk country, and you have not been immunized against hepatitis B (hepatitis B vaccine).  You have HIV or AIDS.  You use needles to inject street drugs.  You live with someone who has hepatitis B.  You have had sex with someone who has hepatitis B.  You get hemodialysis treatment.  You take certain medicines for conditions, including cancer, organ transplantation, and autoimmune conditions. Hepatitis C  Blood testing is recommended for:  Everyone born from [birth year] through 1963.  Anyone with known risk factors for hepatitis C. Sexually transmitted infections (STIs)  You should be screened for sexually transmitted infections (STIs) including gonorrhea and chlamydia if:  You are sexually active and are younger than 81 years of age.  You are older than 81 years of age and your health care provider tells you that you are at risk for this type of infection.  Your sexual activity has changed since you were last screened and you are at an increased risk for chlamydia or gonorrhea. Ask your health care provider if you are at risk.  If you do not have HIV, but are at risk, it may be recommended that you take a prescription medicine daily to prevent HIV infection. This is called pre-exposure prophylaxis (PrEP). You are considered at risk if:  You are sexually active and do not regularly use condoms or know the HIV status of your partner(s).  You take drugs by injection.  You are sexually active with a partner who has HIV. Talk with your health care provider about whether you are at high risk of being infected with HIV. If you choose to begin PrEP, you should first be tested for HIV. You should then be tested every 3 months for as long as you are taking PrEP. Pregnancy  If you are premenopausal and you may become pregnant, ask your health care provider  about preconception counseling.  If you may become pregnant, take 400 to 800 micrograms (mcg) of folic acid every day.  If you want to prevent pregnancy, talk to your health care provider about birth control (contraception). Osteoporosis and menopause  Osteoporosis is a disease in which the bones lose minerals and strength with aging. This can result in serious bone fractures. Your risk for osteoporosis can be identified using a bone density scan.  If you are 75 years of age or older, or if you are at risk for osteoporosis and fractures, ask your health care provider if you should be screened.  Ask your health care provider whether you should take a calcium or vitamin D supplement to lower your risk for osteoporosis.  Menopause may have certain physical symptoms and risks.  Hormone replacement therapy may reduce some of  these symptoms and risks. Talk to your health care provider about whether hormone replacement therapy is right for you. Follow these instructions at home:  Schedule regular health, dental, and eye exams.  Stay current with your immunizations.  Do not use any tobacco products including cigarettes, chewing tobacco, or electronic cigarettes.  If you are pregnant, do not drink alcohol.  If you are breastfeeding, limit how much and how often you drink alcohol.  Limit alcohol intake to no more than 1 drink per day for nonpregnant women. One drink equals 12 ounces of beer, 5 ounces of wine, or 1 ounces of hard liquor.  Do not use street drugs.  Do not share needles.  Ask your health care provider for help if you need support or information about quitting drugs.  Tell your health care provider if you often feel depressed.  Tell your health care provider if you have ever been abused or do not feel safe at home. This information is not intended to replace advice given to you by your health care provider. Make sure you discuss any questions you have with your health care  provider. Document Released: 04/26/2011 Document Revised: 03/18/2016 Document Reviewed: 07/15/2015  2017 [employer]   Mammogram A mammogram is an X-ray of the breasts that is done to check for abnormal changes. This procedure can screen for and detect any changes that may suggest breast cancer. A mammogram can also identify other changes and variations in the breast, such as:  Inflammation of the breast tissue (mastitis).  An infected area that contains a collection of pus (abscess).  A fluid-filled sac (cyst).  Fibrocystic changes. This is when breast tissue becomes denser, which can make the tissue feel rope-like or uneven under the skin.  Tumors that are not cancerous (benign). Tell a health care provider about:  Any allergies you have.  If you have breast implants.  If you have had previous breast disease, biopsy, or surgery.  If you are breastfeeding.  Any possibility that you could be pregnant, if this applies.  If you are younger than age 37.  If you have a family history of breast cancer. What are the risks? Generally, this is a safe procedure. However, problems may occur, including:  Exposure to radiation. Radiation levels are very low with this test.  The results being misinterpreted.  The need for further tests.  The inability of the mammogram to detect certain cancers. What happens before the procedure?  Schedule your test about 1-2 weeks after your menstrual period. This is usually when your breasts are the least tender.  If you have had a mammogram done at a different facility in the past, get the mammogram X-rays or have them sent to your current exam facility in order to compare them.  Wash your breasts and under your arms the day of the test.  Do not wear deodorants, perfumes, lotions, or powders anywhere on your body on the day of the test.  Remove any jewelry from your neck.  Wear clothes that you can change into and out of easily. What happens  during the procedure?  You will undress from the waist up and put on a gown.  You will stand in front of the X-ray machine.  Each breast will be placed between two plastic or glass plates. The plates will compress your breast for a few seconds. Try to stay as relaxed as possible during the procedure. This does not cause any harm to your breasts and any discomfort you  feel will be very brief.  X-rays will be taken from different angles of each breast. The procedure may vary among health care providers and hospitals. What happens after the procedure?  The mammogram will be examined by a specialist (radiologist).  You may need to repeat certain parts of the test, depending on the quality of the images. This is commonly done if the radiologist needs a better view of the breast tissue.  Ask when your test results will be ready. Make sure you get your test results.  You may resume your normal activities. This information is not intended to replace advice given to you by your health care provider. Make sure you discuss any questions you have with your health care provider. Document Released: 10/08/2000 Document Revised: 03/15/2016 Document Reviewed: 12/20/2014 [employer] Interactive Patient Education  2017 [employer].

## 2016-12-14 ENCOUNTER — Ambulatory Visit (INDEPENDENT_AMBULATORY_CARE_PROVIDER_SITE_OTHER): Payer: Medicare Other | Admitting: Family Medicine

## 2016-12-14 VITALS — BP 150/80 | HR 73 | Ht 65.0 in | Wt 119.5 lb

## 2016-12-14 DIAGNOSIS — K4090 Unilateral inguinal hernia, without obstruction or gangrene, not specified as recurrent: Secondary | ICD-10-CM | POA: Diagnosis not present

## 2016-12-14 DIAGNOSIS — R319 Hematuria, unspecified: Secondary | ICD-10-CM

## 2016-12-14 LAB — POCT URINALYSIS DIPSTICK
Bilirubin, UA: NEGATIVE
Blood, UA: NEGATIVE
Glucose, UA: NEGATIVE
Ketones, UA: NEGATIVE
Leukocytes, UA: NEGATIVE
Nitrite, UA: NEGATIVE
Protein, UA: NEGATIVE
Spec Grav, UA: 1.015
Urobilinogen, UA: 0.2
pH, UA: 7.5

## 2016-12-14 NOTE — Patient Instructions (Signed)
Inguinal Hernia, Adult Introduction An inguinal hernia is when fat or the intestines push through the area where the leg meets the lower abdomen (groin) and create a rounded lump (bulge). This condition develops over time. There are three types of inguinal hernias. These types include:  Hernias that can be pushed back into the belly (are reducible).  Hernias that are not reducible (are incarcerated).  Hernias that are not reducible and lose their blood supply (are strangulated). This type of hernia requires emergency surgery. What are the causes? This condition is caused by having a weak spot in the muscles or tissue. This weakness lets the hernia poke through. This condition can be triggered by:  Suddenly straining the muscles of the lower abdomen.  Lifting heavy objects.  Straining to have a bowel movement. Difficult bowel movements (constipation) can lead to this.  Coughing. What increases the risk? This condition is more likely to develop in:  Men.  Pregnant women.  People who:  Are overweight.  Work in jobs that require long periods of standing or heavy lifting.  Have had an inguinal hernia before.  Smoke or have lung disease. These factors can lead to long-lasting (chronic) coughing. What are the signs or symptoms? Symptoms can depend on the size of the hernia. Often, a small inguinal hernia has no symptoms. Symptoms of a larger hernia include:  A lump in the groin. This is easier to see when the person is standing. It might not be visible when he or she is lying down.  Pain or burning in the groin. This occurs especially when lifting, straining, or coughing.  A dull ache or a feeling of pressure in the groin.  A lump in the scrotum in men. Symptoms of a strangulated inguinal hernia can include:  A bulge in the groin that is very painful and tender to the touch.  A bulge that turns red or purple.  Fever, nausea, and vomiting.  The inability to have a bowel  movement or to pass gas. How is this diagnosed? This condition is diagnosed with a medical history and physical exam. Your health care provider may feel your groin area and ask you to cough. How is this treated? Treatment for this condition varies depending on the size of your hernia and whether you have symptoms. If you do not have symptoms, your health care provider may have you watch your hernia carefully and come in for follow-up visits. If your hernia is larger or if you have symptoms, your treatment will include surgery. Follow these instructions at home: Lifestyle  Drink enough fluid to keep your urine clear or pale yellow.  Eat a diet that includes a lot of fiber. Eat plenty of fruits, vegetables, and whole grains. Talk with your health care provider if you have questions.  Avoid lifting heavy objects.  Avoid standing for long periods of time.  Do not use tobacco products, including cigarettes, chewing tobacco, or e-cigarettes. If you need help quitting, ask your health care provider.  Maintain a healthy weight. General instructions  Do not try to force the hernia back in.  Watch your hernia for any changes in color or size. Let your health care provider know if any changes occur.  Take over-the-counter and prescription medicines only as told by your health care provider.  Keep all follow-up visits as told by your health care provider. This is important. Contact a health care provider if:  You have a fever.  You have new symptoms.  Your symptoms  get worse. Get help right away if:  You have pain in the groin that suddenly gets worse.  A bulge in the groin gets bigger suddenly and does not go down.  You are a man and you have a sudden pain in the scrotum, or the size of your scrotum suddenly changes.  A bulge in the groin area becomes red or purple and is painful to the touch.  You have nausea or vomiting that does not go away.  You feel your heart beating a lot  more quickly than normal.  You cannot have a bowel movement or pass gas. This information is not intended to replace advice given to you by your health care provider. Make sure you discuss any questions you have with your health care provider. Document Released: 02/27/2009 Document Revised: 03/18/2016 Document Reviewed: 08/21/2014  2017 Elsevier

## 2016-12-14 NOTE — Progress Notes (Signed)
Pre visit review using our clinic review tool, if applicable. No additional management support is needed unless otherwise documented below in the visit note. 

## 2016-12-14 NOTE — Progress Notes (Signed)
Subjective:     Patient ID: [name], female   DOB: 1931-12-13, 83 y.o.   MRN: [id number]  HPI Patient's seen for the following issues  She has some recent dysuria. Urine dip revealed 1+ blood and small leukocytes. She was placed on Keflex but urine culture came back negative. She has never had any gross hematuria. No history of kidney stones. Urinary symptoms have resolved.  Patient's relates approximately one-month history of some inguinal swelling. No prior history of hernia. Only very minimal discomfort. No injury. Denies any abdominal pain. No change in bowel habits.  Past Medical History:  Diagnosis Date  . Diverticulosis   . Hypertension   . Irritable bowel syndrome   . Osteoporosis   . UTI (lower urinary tract infection)    Past Surgical History:  Procedure Laterality Date  . APPENDECTOMY    . colon polyps    . LUMBAR FUSION    . LUMBAR LAMINECTOMY    . partial resection of colon for tumor    . TOTAL HIP ARTHROPLASTY     right    reports that she has never smoked. She uses smokeless tobacco. She reports that she does not drink alcohol or use drugs. family history includes Breast cancer in her sister; Cancer in her father; Colon cancer in her mother; Heart disease in her brother; Liver cancer in her brother; Lung cancer in her father; Ovarian cancer in her sister. Allergies  Allergen Reactions  . Codeine     REACTION: headaches  . Flagyl [Metronidazole Hcl]     nausea     Review of Systems  Constitutional: Negative for appetite change, chills, fever and unexpected weight change.  Respiratory: Negative for shortness of breath.   Cardiovascular: Negative for chest pain.  Gastrointestinal: Negative for abdominal pain, blood in stool, nausea and vomiting.  Genitourinary: Negative for flank pain and hematuria.  Neurological: Negative for weakness.  Hematological: Negative for adenopathy.       Objective:   Physical Exam  Constitutional: She appears  well-developed and well-nourished.  Cardiovascular: Normal rate and regular rhythm.   Pulmonary/Chest: Effort normal and breath sounds normal. No respiratory distress. She has no wheezes. She has no rales.  Abdominal:  Pt has small left internal hernia which is soft and reducible and nontender.  Skin: No rash noted.       Assessment:     #1 recent hematuria on urine dipstick with negative urine culture  #2 left inguinal hernia-minimally symptomatic    Plan:     -Repeat urine dipstick today and if hematuria send for urine micro- (urine dip today no hematuria) -We discussed hernias. We reviewed signs and symptoms of strangulation. -Even though her current hernia is minimally symptomatic she would like to go ahead and discuss with general surgeon pros and cons of repair. She is fairly active still  Eulas Post MD  Primary Care at [facility]

## 2016-12-23 ENCOUNTER — Ambulatory Visit: Payer: Self-pay | Admitting: General Surgery

## 2016-12-23 DIAGNOSIS — K4090 Unilateral inguinal hernia, without obstruction or gangrene, not specified as recurrent: Secondary | ICD-10-CM | POA: Diagnosis not present

## 2016-12-23 NOTE — H&P (Signed)
[name] 12/23/2016 1:50 PM Location: [facility] Patient #: [id number] DOB: 04/04/1932 Married / Language: English / Race: White Female  History of Present Illness (Miracle Mongillo J. Singleton Hickox MD; 12/23/2016 4:46 PM) The patient is a 84 year old female.   Note:She is referred by Dr. Burchette for consultation regarding a left inguinal hernia. She has noticed a left inguinal bulge that is intermittently uncomfortable. She is very active. In the morning, when she is lying down, it is not there. When she stands up she notices the small bulge. She's had no obstructive symptoms. No difficulty starting urination. She has occasional constipation. No chronic cough or sneezing. No known family history of hernia disease. She is married and lives at home with her husband.  Past Surgical History (Sade Bradford, CMA; 12/23/2016 1:50 PM) Appendectomy Breast Biopsy Left. Colon Polyp Removal - Colonoscopy Hemorrhoidectomy Hip Surgery Right. Resection of Small Bowel Shoulder Surgery Left. Spinal Surgery - Lower Back  Diagnostic Studies History (Sade Bradford, CMA; 12/23/2016 1:50 PM) Colonoscopy 1-5 years ago Mammogram >3 years ago  Allergies (Sade Bradford, CMA; 12/23/2016 1:52 PM) Codeine and Related Headache Allergies Reconciled Flagyl *ANTI-INFECTIVE AGENTS - MISC.* Nausea and vomiting  Medication History (Sade Bradford, CMA; 12/23/2016 1:55 PM) B Complex 1 (Oral daily) Active. Ziac (5-6.25MG Tablet, Oral daily) Active. Prolia (60MG/ML Solution, Subcutaneous) Active. Glucosamine-Chondroitin (250-200MG Capsule, Oral daily) Active. Analpram HC Singles (2.5-1% Cream, Rectal daily) Active. Mobic (15MG Tablet, Oral daily) Active. Citracal Plus (Oral daily) Active. Fish Oil (1000MG Capsule, Oral daily) Active. Vitamins for Hair (Oral daily) Active. Drisdol (50000UNIT Capsule, Oral daily) Active. Medications Reconciled  Social History (Sade Bradford, CMA;  12/23/2016 1:50 PM) Caffeine use Coffee. No alcohol use No drug use Tobacco use Never smoker.  Family History (Sade Bradford, CMA; 12/23/2016 1:50 PM) Arthritis Mother. Breast Cancer Sister. Colon Cancer Mother. Heart Disease Brother, Sister. Heart disease in female family member before age 65 Heart disease in female family member before age 55 Hypertension Sister. Ovarian Cancer Sister. Respiratory Condition Brother.  Pregnancy / Birth History (Sade Bradford, CMA; 12/23/2016 1:50 PM) Age of menopause 46-50 Gravida 1 Para 1  Other Problems (Sade Bradford, CMA; 12/23/2016 1:50 PM) Arthritis Back Pain Diverticulosis Hemorrhoids High blood pressure     Review of Systems (Sade Bradford CMA; 12/23/2016 1:50 PM) General Not Present- Appetite Loss, Chills, Fatigue, Fever, Night Sweats, Weight Gain and Weight Loss. Skin Not Present- Change in Wart/Mole, Dryness, Hives, Jaundice, New Lesions, Non-Healing Wounds, Rash and Ulcer. HEENT Not Present- Earache, Hearing Loss, Hoarseness, Nose Bleed, Oral Ulcers, Ringing in the Ears, Seasonal Allergies, Sinus Pain, Sore Throat, Visual Disturbances, Wears glasses/contact lenses and Yellow Eyes. Respiratory Not Present- Bloody sputum, Chronic Cough, Difficulty Breathing, Snoring and Wheezing. Breast Not Present- Breast Mass, Breast Pain, Nipple Discharge and Skin Changes. Cardiovascular Present- Leg Cramps. Not Present- Chest Pain, Difficulty Breathing Lying Down, Palpitations, Rapid Heart Rate, Shortness of Breath and Swelling of Extremities. Gastrointestinal Present- Abdominal Pain and Nausea. Not Present- Bloating, Bloody Stool, Change in Bowel Habits, Chronic diarrhea, Constipation, Difficulty Swallowing, Excessive gas, Gets full quickly at meals, Hemorrhoids, Indigestion, Rectal Pain and Vomiting. Female Genitourinary Present- Nocturia. Not Present- Frequency, Painful Urination, Pelvic Pain and Urgency. Neurological Not Present-  Decreased Memory, Fainting, Headaches, Numbness, Seizures, Tingling, Tremor, Trouble walking and Weakness. Psychiatric Not Present- Anxiety, Bipolar, Change in Sleep Pattern, Depression, Fearful and Frequent crying. Endocrine Not Present- Cold Intolerance, Excessive Hunger, Hair Changes, Heat Intolerance, Hot flashes and New Diabetes. Hematology Not Present- Blood Thinners, Easy   Bruising, Excessive bleeding, Gland problems, HIV and Persistent Infections.  Vitals (Sade Bradford CMA; 12/23/2016 1:55 PM) 12/23/2016 1:55 PM Weight: 119 lb Height: 65.5in Body Surface Area: 1.6 m Body Mass Index: 19.5 kg/m  Temp.: 97.8F  Pulse: 81 (Regular)  BP: 142/82 (Sitting, Left Arm, Standard)      Physical Exam (Cordon Gassett J. Jolette Lana MD; 12/23/2016 4:48 PM)  The physical exam findings are as follows: Note:GENERAL APPEARANCE: Thin, elderly female in NAD. Pleasant and cooperative.  EARS, NOSE, MOUTH THROAT: [place]/AT external ears: no lesions or deformities external nose: no lesions or deformities hearing: grossly normal lips: moist, no deformities EYES external: conjunctiva, lids, sclerae normal pupils: equal, round  CV ascultation: RRR, no murmur extremity edema: no extremity varicosities: no  RESP auscultation: breath sounds equal and clear respiratory effort: normal  GASTROINTESTINAL abdomen: Soft, non-tender, non-distended, no masses liver and spleen: not enlarged. hernia: left inguinal, reducible.  MUSCULOSKELETAL station and gait: normal digits/nails: no clubbing or cyanosis instability: none  NEUROLOGIC speech: normal  PSYCHIATRIC alertness and orientation: normal mood/affect/behavior: normal judgement and insight: normal    Assessment & Plan (Saiquan Hands J. Freedom Lopezperez MD; 12/23/2016 4:49 PM)  INGUINAL HERNIA OF LEFT SIDE WITHOUT OBSTRUCTION OR GANGRENE (K40.90) Impression: Intermittently symptomatic. We talked about options of not repairing it or doing an open left  inguinal hernia. with mesh, which I recommended. She agrees with the recommendation.  Plan: Schedule open left inguinal hernia repair with mesh. The fish oil 5 days prior to the surgery. I have explained the procedure, risks, and aftercare of inguinal hernia repair. Risks include but are not limited to bleeding, infection, wound problems, anesthesia, recurrence, bladder or intestine injury, urinary retention, chronic pain, mesh problems. She seems to understand and agrees with the plan. We also discussed what to do if she ends up with a rare complication of an incarcerated hernia (go straight to the emergency room).  Baylei Siebels, M.D. 

## 2017-01-05 ENCOUNTER — Encounter (HOSPITAL_BASED_OUTPATIENT_CLINIC_OR_DEPARTMENT_OTHER): Payer: Self-pay | Admitting: *Deleted

## 2017-01-05 NOTE — Progress Notes (Signed)
Pt instructed npo pmn 3/21. No meds am of surgery.  Aware to hold fish oil, mvi herbal supplements 5 days pta.  To [facility] 3/22 @ 0945.  Needs istat on arrival.  Pt aware to do hibiclens shower hs and am of surgery.

## 2017-01-13 ENCOUNTER — Ambulatory Visit (HOSPITAL_BASED_OUTPATIENT_CLINIC_OR_DEPARTMENT_OTHER)
Admission: RE | Admit: 2017-01-13 | Discharge: 2017-01-13 | Disposition: A | Discharge status: Home / Self Care | Payer: Medicare Other | Source: Ambulatory Visit | Attending: General Surgery | Admitting: General Surgery

## 2017-01-13 ENCOUNTER — Ambulatory Visit (HOSPITAL_BASED_OUTPATIENT_CLINIC_OR_DEPARTMENT_OTHER): Payer: Medicare Other | Admitting: Anesthesiology

## 2017-01-13 ENCOUNTER — Encounter (HOSPITAL_BASED_OUTPATIENT_CLINIC_OR_DEPARTMENT_OTHER): Payer: Self-pay | Admitting: *Deleted

## 2017-01-13 ENCOUNTER — Encounter (HOSPITAL_BASED_OUTPATIENT_CLINIC_OR_DEPARTMENT_OTHER)
Admission: RE | Disposition: A | Discharge status: Home / Self Care | Payer: Self-pay | Source: Ambulatory Visit | Attending: General Surgery

## 2017-01-13 DIAGNOSIS — K5790 Diverticulosis of intestine, part unspecified, without perforation or abscess without bleeding: Secondary | ICD-10-CM | POA: Insufficient documentation

## 2017-01-13 DIAGNOSIS — Z86010 Personal history of colonic polyps: Secondary | ICD-10-CM | POA: Insufficient documentation

## 2017-01-13 DIAGNOSIS — Z885 Allergy status to narcotic agent status: Secondary | ICD-10-CM | POA: Insufficient documentation

## 2017-01-13 DIAGNOSIS — K4090 Unilateral inguinal hernia, without obstruction or gangrene, not specified as recurrent: Secondary | ICD-10-CM | POA: Insufficient documentation

## 2017-01-13 DIAGNOSIS — I10 Essential (primary) hypertension: Secondary | ICD-10-CM | POA: Diagnosis not present

## 2017-01-13 DIAGNOSIS — Z79899 Other long term (current) drug therapy: Secondary | ICD-10-CM | POA: Insufficient documentation

## 2017-01-13 DIAGNOSIS — Z8261 Family history of arthritis: Secondary | ICD-10-CM | POA: Insufficient documentation

## 2017-01-13 DIAGNOSIS — Z888 Allergy status to other drugs, medicaments and biological substances status: Secondary | ICD-10-CM | POA: Diagnosis not present

## 2017-01-13 DIAGNOSIS — Z803 Family history of malignant neoplasm of breast: Secondary | ICD-10-CM | POA: Diagnosis not present

## 2017-01-13 DIAGNOSIS — M549 Dorsalgia, unspecified: Secondary | ICD-10-CM | POA: Insufficient documentation

## 2017-01-13 DIAGNOSIS — Z836 Family history of other diseases of the respiratory system: Secondary | ICD-10-CM | POA: Diagnosis not present

## 2017-01-13 DIAGNOSIS — Z8049 Family history of malignant neoplasm of other genital organs: Secondary | ICD-10-CM | POA: Diagnosis not present

## 2017-01-13 DIAGNOSIS — D649 Anemia, unspecified: Secondary | ICD-10-CM | POA: Diagnosis not present

## 2017-01-13 DIAGNOSIS — Z800 Family history of malignant neoplasm of digestive organs: Secondary | ICD-10-CM | POA: Insufficient documentation

## 2017-01-13 DIAGNOSIS — Z8249 Family history of ischemic heart disease and other diseases of the circulatory system: Secondary | ICD-10-CM | POA: Diagnosis not present

## 2017-01-13 DIAGNOSIS — E785 Hyperlipidemia, unspecified: Secondary | ICD-10-CM | POA: Diagnosis not present

## 2017-01-13 HISTORY — PX: INGUINAL HERNIA REPAIR: SHX194

## 2017-01-13 HISTORY — PX: INSERTION OF MESH: SHX5868

## 2017-01-13 HISTORY — DX: Unilateral inguinal hernia, without obstruction or gangrene, not specified as recurrent: K40.90

## 2017-01-13 HISTORY — DX: Constipation, unspecified: K59.00

## 2017-01-13 LAB — POCT I-STAT 4, (NA,K, GLUC, HGB,HCT)
Glucose, Bld: 91 mg/dL (ref 65–99)
HCT: 36 % (ref 36.0–46.0)
Hemoglobin: 12.2 g/dL (ref 12.0–15.0)
Potassium: 4 mmol/L (ref 3.5–5.1)
Sodium: 139 mmol/L (ref 135–145)

## 2017-01-13 SURGERY — REPAIR, HERNIA, INGUINAL, ADULT
Anesthesia: General | Site: Inguinal | Laterality: Left

## 2017-01-13 MED ORDER — FENTANYL CITRATE (PF) 100 MCG/2ML IJ SOLN
INTRAMUSCULAR | Status: AC
  Filled 2017-01-13: qty 2

## 2017-01-13 MED ORDER — CEFAZOLIN SODIUM-DEXTROSE 2-4 GM/100ML-% IV SOLN
INTRAVENOUS | Status: AC
  Filled 2017-01-13: qty 100

## 2017-01-13 MED ORDER — FENTANYL CITRATE (PF) 100 MCG/2ML IJ SOLN
25.0000 ug | INTRAMUSCULAR | Status: DC | PRN
  Filled 2017-01-13: qty 1

## 2017-01-13 MED ORDER — SODIUM CHLORIDE 0.9% FLUSH
3.0000 mL | INTRAVENOUS | Status: DC | PRN
  Filled 2017-01-13: qty 3

## 2017-01-13 MED ORDER — ACETAMINOPHEN 650 MG RE SUPP
650.0000 mg | RECTAL | Status: DC | PRN
  Filled 2017-01-13: qty 1

## 2017-01-13 MED ORDER — FENTANYL CITRATE (PF) 100 MCG/2ML IJ SOLN
75.0000 ug | Freq: Once | INTRAMUSCULAR | Status: AC
Start: 2017-01-13 — End: 2017-01-13
  Administered 2017-01-13: 75 ug via INTRAVENOUS
  Filled 2017-01-13: qty 1.5

## 2017-01-13 MED ORDER — KETOROLAC TROMETHAMINE 30 MG/ML IJ SOLN
15.0000 mg | Freq: Once | INTRAMUSCULAR | Status: DC | PRN
  Filled 2017-01-13: qty 1

## 2017-01-13 MED ORDER — MORPHINE SULFATE (PF) 2 MG/ML IV SOLN
1.0000 mg | INTRAVENOUS | Status: DC | PRN
  Filled 2017-01-13: qty 1

## 2017-01-13 MED ORDER — LACTATED RINGERS IV SOLN
INTRAVENOUS | Status: DC
  Administered 2017-01-13: 10:00:00 via INTRAVENOUS
  Filled 2017-01-13: qty 1000

## 2017-01-13 MED ORDER — HYDROCODONE-ACETAMINOPHEN 5-325 MG PO TABS
ORAL_TABLET | ORAL | Status: AC
  Filled 2017-01-13: qty 1

## 2017-01-13 MED ORDER — ONDANSETRON HCL 4 MG/2ML IJ SOLN
INTRAMUSCULAR | Status: DC | PRN
  Administered 2017-01-13: 4 mg via INTRAVENOUS

## 2017-01-13 MED ORDER — BUPIVACAINE-EPINEPHRINE 0.5% -1:200000 IJ SOLN
INTRAMUSCULAR | Status: DC | PRN
  Administered 2017-01-13: 19 mL

## 2017-01-13 MED ORDER — EPHEDRINE 5 MG/ML INJ
INTRAVENOUS | Status: AC
Start: 2017-01-13 — End: 2017-01-13
  Filled 2017-01-13: qty 10

## 2017-01-13 MED ORDER — PROPOFOL 10 MG/ML IV BOLUS
INTRAVENOUS | Status: AC
  Filled 2017-01-13: qty 20

## 2017-01-13 MED ORDER — DEXAMETHASONE SODIUM PHOSPHATE 10 MG/ML IJ SOLN
INTRAMUSCULAR | Status: AC
  Filled 2017-01-13: qty 1

## 2017-01-13 MED ORDER — ONDANSETRON HCL 4 MG/2ML IJ SOLN
INTRAMUSCULAR | Status: AC
  Filled 2017-01-13: qty 2

## 2017-01-13 MED ORDER — CEFAZOLIN SODIUM-DEXTROSE 2-4 GM/100ML-% IV SOLN
2.0000 g | INTRAVENOUS | Status: AC
  Administered 2017-01-13: 2 g via INTRAVENOUS
  Filled 2017-01-13: qty 100

## 2017-01-13 MED ORDER — ACETAMINOPHEN 325 MG PO TABS
650.0000 mg | ORAL_TABLET | ORAL | Status: DC | PRN
  Filled 2017-01-13: qty 2

## 2017-01-13 MED ORDER — EPHEDRINE SULFATE-NACL 50-0.9 MG/10ML-% IV SOSY
PREFILLED_SYRINGE | INTRAVENOUS | Status: DC | PRN
  Administered 2017-01-13: 5 mg via INTRAVENOUS
  Administered 2017-01-13 (×2): 10 mg via INTRAVENOUS

## 2017-01-13 MED ORDER — CHLORHEXIDINE GLUCONATE CLOTH 2 % EX PADS
6.0000 | MEDICATED_PAD | Freq: Once | CUTANEOUS | Status: DC
  Filled 2017-01-13: qty 6

## 2017-01-13 MED ORDER — PROPOFOL 10 MG/ML IV BOLUS
INTRAVENOUS | Status: DC | PRN
  Administered 2017-01-13: 150 mg via INTRAVENOUS

## 2017-01-13 MED ORDER — PROMETHAZINE HCL 25 MG/ML IJ SOLN
6.2500 mg | INTRAMUSCULAR | Status: DC | PRN
  Filled 2017-01-13: qty 1

## 2017-01-13 MED ORDER — LIDOCAINE 2% (20 MG/ML) 5 ML SYRINGE
INTRAMUSCULAR | Status: DC | PRN
  Administered 2017-01-13: 80 mg via INTRAVENOUS

## 2017-01-13 MED ORDER — DEXAMETHASONE SODIUM PHOSPHATE 4 MG/ML IJ SOLN
INTRAMUSCULAR | Status: DC | PRN
  Administered 2017-01-13: 10 mg via INTRAVENOUS

## 2017-01-13 MED ORDER — HYDROCODONE-ACETAMINOPHEN 5-325 MG PO TABS
1.0000 | ORAL_TABLET | Freq: Four times a day (QID) | ORAL | Status: DC | PRN
  Administered 2017-01-13: 1 via ORAL
  Filled 2017-01-13: qty 2

## 2017-01-13 MED ORDER — HYDROCODONE-ACETAMINOPHEN 5-325 MG PO TABS: 1.0000 | ORAL_TABLET | ORAL | 0 refills | Status: DC | PRN

## 2017-01-13 SURGICAL SUPPLY — 60 items
BENZOIN TINCTURE PRP APPL 2/3 (GAUZE/BANDAGES/DRESSINGS) ×2 IMPLANT
BLADE CLIPPER SURG (BLADE) ×2 IMPLANT
BLADE HEX COATED 2.75 (ELECTRODE) ×2 IMPLANT
BLADE SURG 10 STRL SS (BLADE) ×2 IMPLANT
BLADE SURG 15 STRL LF DISP TIS (BLADE) ×1 IMPLANT
BLADE SURG 15 STRL SS (BLADE) ×1
CANISTER SUCTION 1200CC (MISCELLANEOUS) IMPLANT
CHLORAPREP W/TINT 26ML (MISCELLANEOUS) ×2 IMPLANT
CLEANER CAUTERY TIP 5X5 PAD (MISCELLANEOUS) IMPLANT
CLOTH BEACON ORANGE TIMEOUT ST (SAFETY) ×2 IMPLANT
COVER BACK TABLE 60X90IN (DRAPES) ×2 IMPLANT
COVER MAYO STAND STRL (DRAPES) ×2 IMPLANT
DISSECTOR ROUND CHERRY 3/8 STR (MISCELLANEOUS) IMPLANT
DRAIN PENROSE 18X1/2 LTX STRL (DRAIN) IMPLANT
DRAPE INCISE IOBAN 66X45 STRL (DRAPES) ×2 IMPLANT
DRAPE LAPAROTOMY TRNSV 102X78 (DRAPE) ×2 IMPLANT
DRAPE UTILITY XL STRL (DRAPES) ×2 IMPLANT
DRSG TEGADERM 2-3/8X2-3/4 SM (GAUZE/BANDAGES/DRESSINGS) IMPLANT
DRSG TEGADERM 4X4.75 (GAUZE/BANDAGES/DRESSINGS) ×2 IMPLANT
DRSG TELFA 3X8 NADH (GAUZE/BANDAGES/DRESSINGS) ×2 IMPLANT
ELECT REM PT RETURN 9FT ADLT (ELECTROSURGICAL) ×2
ELECTRODE REM PT RTRN 9FT ADLT (ELECTROSURGICAL) ×1 IMPLANT
GAUZE SPONGE 4X4 12PLY STRL (GAUZE/BANDAGES/DRESSINGS) ×2 IMPLANT
GAUZE SPONGE 4X4 12PLY STRL LF (GAUZE/BANDAGES/DRESSINGS) ×2 IMPLANT
GAUZE SPONGE 4X4 16PLY XRAY LF (GAUZE/BANDAGES/DRESSINGS) IMPLANT
GLOVE BIOGEL PI IND STRL 7.0 (GLOVE) ×2 IMPLANT
GLOVE BIOGEL PI IND STRL 7.5 (GLOVE) ×1 IMPLANT
GLOVE BIOGEL PI INDICATOR 7.0 (GLOVE) ×2
GLOVE BIOGEL PI INDICATOR 7.5 (GLOVE) ×1
GLOVE ECLIPSE 8.0 STRL XLNG CF (GLOVE) ×2 IMPLANT
GLOVE INDICATOR 8.0 STRL GRN (GLOVE) ×2 IMPLANT
GOWN STRL REUS W/ TWL LRG LVL3 (GOWN DISPOSABLE) ×3 IMPLANT
GOWN STRL REUS W/ TWL XL LVL3 (GOWN DISPOSABLE) IMPLANT
GOWN STRL REUS W/TWL LRG LVL3 (GOWN DISPOSABLE) ×3
GOWN STRL REUS W/TWL XL LVL3 (GOWN DISPOSABLE)
KIT RM TURNOVER CYSTO AR (KITS) ×2 IMPLANT
MESH HERNIA 3X6 (Mesh General) ×2 IMPLANT
NEEDLE HYPO 25X1 1.5 SAFETY (NEEDLE) ×2 IMPLANT
NS IRRIG 500ML POUR BTL (IV SOLUTION) ×2 IMPLANT
PACK BASIN DAY SURGERY FS (CUSTOM PROCEDURE TRAY) ×2 IMPLANT
PAD CLEANER CAUTERY TIP 5X5 (MISCELLANEOUS)
PENCIL BUTTON HOLSTER BLD 10FT (ELECTRODE) ×2 IMPLANT
SPONGE LAP 4X18 X RAY DECT (DISPOSABLE) ×2 IMPLANT
STRIP CLOSURE SKIN 1/2X4 (GAUZE/BANDAGES/DRESSINGS) ×2 IMPLANT
SUT MNCRL AB 4-0 PS2 18 (SUTURE) ×2 IMPLANT
SUT NOVA 0 T19/GS 22DT (SUTURE) IMPLANT
SUT PROLENE 0 CT 2 (SUTURE) IMPLANT
SUT PROLENE 2 0 CT2 30 (SUTURE) ×2 IMPLANT
SUT SURG 0 T 19/GS 22 1969 62 (SUTURE) IMPLANT
SUT VIC AB 2-0 SH 18 (SUTURE) ×2 IMPLANT
SUT VIC AB 3-0 54XBRD REEL (SUTURE) IMPLANT
SUT VIC AB 3-0 BRD 54 (SUTURE)
SUT VIC AB 3-0 SH 27 (SUTURE) ×1
SUT VIC AB 3-0 SH 27X BRD (SUTURE) ×1 IMPLANT
SYR BULB IRRIGATION 50ML (SYRINGE) ×2 IMPLANT
SYR CONTROL 10ML LL (SYRINGE) ×2 IMPLANT
TOWEL OR 17X24 6PK STRL BLUE (TOWEL DISPOSABLE) ×4 IMPLANT
TUBE CONNECTING 12X1/4 (SUCTIONS) IMPLANT
YANKAUER SUCT BULB TIP 10FT TU (MISCELLANEOUS) IMPLANT
YANKAUER SUCT BULB TIP NO VENT (SUCTIONS) IMPLANT

## 2017-01-13 NOTE — H&P (View-Only) (Signed)
[name] 12/23/2016 1:50 PM Location: [facility] Patient #: [id number] DOB: 27-Dec-1931 Married / Language: English / Race: White Female  History of Present Illness Tammy Hollingshead MD; 12/23/2016 4:46 PM) The patient is a 83 year old female.   Note:She is referred by Dr. Elease Hashimoto for consultation regarding a left inguinal hernia. She has noticed a left inguinal bulge that is intermittently uncomfortable. She is very active. In the morning, when she is lying down, it is not there. When she stands up she notices the small bulge. She's had no obstructive symptoms. No difficulty starting urination. She has occasional constipation. No chronic cough or sneezing. No known family history of hernia disease. She is married and lives at home with her husband.  Past Surgical History [name], [place]; 12/23/2016 1:50 PM) Appendectomy Breast Biopsy Left. Colon Polyp Removal - Colonoscopy Hemorrhoidectomy Hip Surgery Right. Resection of Small Bowel Shoulder Surgery Left. Spinal Surgery - Lower Back  Diagnostic Studies History [name], [place]; 12/23/2016 1:50 PM) Colonoscopy 1-5 years ago Mammogram >3 years ago  Allergies [name], CMA; 12/23/2016 1:52 PM) Codeine and Related Headache Allergies Reconciled Flagyl *ANTI-INFECTIVE AGENTS - MISC.* Nausea and vomiting  Medication History [name], CMA; 12/23/2016 1:55 PM) B Complex 1 (Oral daily) Active. Ziac (5-6.25MG  Tablet, Oral daily) Active. Prolia (60MG /ML Solution, Subcutaneous) Active. Glucosamine-Chondroitin (250-200MG  Capsule, Oral daily) Active. Analpram HC Singles (2.5-1% Cream, Rectal daily) Active. Mobic (15MG  Tablet, Oral daily) Active. Citracal Plus (Oral daily) Active. Fish Oil (1000MG  Capsule, Oral daily) Active. Vitamins for Hair (Oral daily) Active. Drisdol (50000UNIT Capsule, Oral daily) Active. Medications Reconciled  Social History [name], [place];  12/23/2016 1:50 PM) Caffeine use Coffee. No alcohol use No drug use Tobacco use Never smoker.  Family History [name], [place]; 12/23/2016 1:50 PM) Arthritis Mother. Breast Cancer Sister. Colon Cancer Mother. Heart Disease Brother, Sister. Heart disease in female family member before age 35 Heart disease in female family member before age 38 Hypertension Sister. Ovarian Cancer Sister. Respiratory Condition Brother.  Pregnancy / Birth History [name], [place]; 12/23/2016 1:50 PM) Age of menopause 54-50 Gravida 1 Para 1  Other Problems [name], [place]; 12/23/2016 1:50 PM) Arthritis Back Pain Diverticulosis Hemorrhoids High blood pressure     Review of Systems [name] CMA; 12/23/2016 1:50 PM) General Not Present- Appetite Loss, Chills, Fatigue, Fever, Night Sweats, Weight Gain and Weight Loss. Skin Not Present- Change in Wart/Mole, Dryness, Hives, Jaundice, New Lesions, Non-Healing Wounds, Rash and Ulcer. HEENT Not Present- Earache, Hearing Loss, Hoarseness, Nose Bleed, Oral Ulcers, Ringing in the Ears, Seasonal Allergies, Sinus Pain, Sore Throat, Visual Disturbances, Wears glasses/contact lenses and Yellow Eyes. Respiratory Not Present- Bloody sputum, Chronic Cough, Difficulty Breathing, Snoring and Wheezing. Breast Not Present- Breast Mass, Breast Pain, Nipple Discharge and Skin Changes. Cardiovascular Present- Leg Cramps. Not Present- Chest Pain, Difficulty Breathing Lying Down, Palpitations, Rapid Heart Rate, Shortness of Breath and Swelling of Extremities. Gastrointestinal Present- Abdominal Pain and Nausea. Not Present- Bloating, Bloody Stool, Change in Bowel Habits, Chronic diarrhea, Constipation, Difficulty Swallowing, Excessive gas, Gets full quickly at meals, Hemorrhoids, Indigestion, Rectal Pain and Vomiting. Female Genitourinary Present- Nocturia. Not Present- Frequency, Painful Urination, Pelvic Pain and Urgency. Neurological Not Present-  Decreased Memory, Fainting, Headaches, Numbness, Seizures, Tingling, Tremor, Trouble walking and Weakness. Psychiatric Not Present- Anxiety, Bipolar, Change in Sleep Pattern, Depression, Fearful and Frequent crying. Endocrine Not Present- Cold Intolerance, Excessive Hunger, Hair Changes, Heat Intolerance, Hot flashes and New Diabetes. Hematology Not Present- Blood Thinners, Easy  Bruising, Excessive bleeding, Gland problems, HIV and Persistent Infections.  Vitals Bary Castilla Bradford CMA; 12/23/2016 1:55 PM) 12/23/2016 1:55 PM Weight: 119 lb Height: 65.5in Body Surface Area: 1.6 m Body Mass Index: 19.5 kg/m  Temp.: 97.34F  Pulse: 81 (Regular)  BP: 142/82 (Sitting, Left Arm, Standard)      Physical Exam Tammy Hollingshead MD; 12/23/2016 4:48 PM)  The physical exam findings are as follows: Note:GENERAL APPEARANCE: Thin, elderly female in NAD. Pleasant and cooperative.  EARS, NOSE, MOUTH THROAT: [place]/AT external ears: no lesions or deformities external nose: no lesions or deformities hearing: grossly normal lips: moist, no deformities EYES external: conjunctiva, lids, sclerae normal pupils: equal, round  CV ascultation: RRR, no murmur extremity edema: no extremity varicosities: no  RESP auscultation: breath sounds equal and clear respiratory effort: normal  GASTROINTESTINAL abdomen: Soft, non-tender, non-distended, no masses liver and spleen: not enlarged. hernia: left inguinal, reducible.  MUSCULOSKELETAL station and gait: normal digits/nails: no clubbing or cyanosis instability: none  NEUROLOGIC speech: normal  PSYCHIATRIC alertness and orientation: normal mood/affect/behavior: normal judgement and insight: normal    Assessment & Plan Tammy Hollingshead MD; 12/23/2016 4:49 PM)  INGUINAL HERNIA OF LEFT SIDE WITHOUT OBSTRUCTION OR GANGRENE (K40.90) Impression: Intermittently symptomatic. We talked about options of not repairing it or doing an open left  inguinal hernia. with mesh, which I recommended. She agrees with the recommendation.  Plan: Schedule open left inguinal hernia repair with mesh. The fish oil 5 days prior to the surgery. I have explained the procedure, risks, and aftercare of inguinal hernia repair. Risks include but are not limited to bleeding, infection, wound problems, anesthesia, recurrence, bladder or intestine injury, urinary retention, chronic pain, mesh problems. She seems to understand and agrees with the plan. We also discussed what to do if she ends up with a rare complication of an incarcerated hernia (go straight to the emergency room).  Jackolyn Confer, M.D.

## 2017-01-13 NOTE — Anesthesia Preprocedure Evaluation (Signed)
Anesthesia Evaluation  Patient identified by MRN, date of birth, ID band Patient awake    Reviewed: Allergy & Precautions, NPO status , Patient's Chart, lab work & pertinent test results  Airway Mallampati: II  TM Distance: >3 FB Neck ROM: Full    Dental no notable dental hx.    Pulmonary neg pulmonary ROS,    Pulmonary exam normal breath sounds clear to auscultation       Cardiovascular hypertension, Normal cardiovascular exam Rhythm:Regular Rate:Normal     Neuro/Psych negative neurological ROS  negative psych ROS   GI/Hepatic negative GI ROS, Neg liver ROS,   Endo/Other  negative endocrine ROS  Renal/GU negative Renal ROS  negative genitourinary   Musculoskeletal negative musculoskeletal ROS (+)   Abdominal   Peds negative pediatric ROS (+)  Hematology negative hematology ROS (+)   Anesthesia Other Findings   Reproductive/Obstetrics negative OB ROS                             Anesthesia Physical Anesthesia Plan  ASA: II  Anesthesia Plan: General   Post-op Pain Management: GA combined w/ Regional for post-op pain   Induction: Intravenous  Airway Management Planned: LMA  Additional Equipment:   Intra-op Plan:   Post-operative Plan:   Informed Consent: I have reviewed the patients History and Physical, chart, labs and discussed the procedure including the risks, benefits and alternatives for the proposed anesthesia with the patient or authorized representative who has indicated his/her understanding and acceptance.   Dental advisory given  Plan Discussed with: CRNA and Surgeon  Anesthesia Plan Comments:         Anesthesia Quick Evaluation

## 2017-01-13 NOTE — Op Note (Signed)
OPERATIVE NOTE-INGUINAL HERNIA REPAIR  Preoperative diagnosis:  Left inguinal hernia.  Postoperative diagnosis:  Same  Procedure:  Left inguinal hernia repair with mesh.  Surgeon:  Jackolyn Confer, M.D.  Anesthesia:  General/LMA with local (Marcaine).  Indication:  This is an active 83 year old female with a symptomatic left inguinal hernia who presents for elective repair.  EBL < 100 ml  Technique:  She was seen in the holding room and the left groin was marked with my initials. She was brought to the operating, placed supine on the operating table, and the anesthetic was administered by the anesthesiologist. The hair in the groin area was clipped as was felt to be necessary. This area was then sterilely prepped and draped. A timeout was performed.  Local anesthetic was infiltrated in the superficial and deep tissues in the left groin.  An incision was made through the skin and subcutaneous tissue until the external oblique aponeurosis was identified.  Local anesthetic was infiltrated deep to the external oblique aponeurosis. The external oblique aponeurosis was divided through the external ring medially and back toward the anterior superior iliac spine laterally. Using blunt dissection, the shelving edge of the inguinal ligament was identified inferiorly and the internal oblique aponeurosis and muscle were identified superiorly. The ilioinguinal nerve and iliohypogastric nerve was identified and preserved.  A nerve block with performed with Marcaine.  The round ligament was isolated and divided. A indirect hernia sac was identified  The hernia sac and its contents were reduced through the indirect hernia defect. The internal ring was then closed with 2-0 Vicryl suture.   A piece of 3" x 6" polypropylene mesh was brought into the field and anchored 1-2 cm medial to the pubic tubercle with 2-0 Prolene suture. The inferior aspect of the mesh was anchored to the shelving edge of the inguinal  ligament with running 2-0 Prolene suture to a level 2 cm lateral to the internal ring. The superior aspect of the mesh was anchored to the internal oblique aponeurosis with interrupted 2-0 Vicryl suture. The lateral aspect of the mesh was then tucked deep to the external oblique aponeurosis.  The wound was inspected and hemostasis was adequate. The external oblique aponeurosis was then closed over the mesh and cord with running 3-0 Vicryl suture. The subcutaneous tissue was closed with running 3-0 Vicryl suture. The skin closed with a running 4-0 Monocryl subcuticular stitch.  Steri-Strips and a sterile dressing were applied.  The procedure was well-tolerated without any apparent complications and she was taken to the recovery room in satisfactory condition.

## 2017-01-13 NOTE — Discharge Instructions (Addendum)
[facility] _______[facility]   INGUINAL HERNIA REPAIR: POST OP INSTRUCTIONS  Always review your discharge instruction sheet given to you by the facility where your surgery was performed. IF YOU HAVE DISABILITY OR FAMILY LEAVE FORMS, YOU MUST BRING THEM TO THE OFFICE FOR PROCESSING.   DO NOT GIVE THEM TO YOUR DOCTOR.  1. A  prescription for pain medication may be given to you upon discharge.  Take your pain medication as prescribed, if needed.  If narcotic pain medicine is not needed, then you may take acetaminophen (Tylenol) or ibuprofen (Advil) as needed. 2. Take your usually prescribed medications unless otherwise directed. 3. If you need a refill on your pain medication, please contact your pharmacy.  They will contact our office to request authorization. Prescriptions will not be filled after 5 pm or on week-ends. 4. You should follow a light diet the first 24 hours after arrival home, such as soup and crackers, etc.  Be sure to include lots of fluids daily.  Resume your normal diet the day after surgery. 5. Most patients will experience some swelling and bruising in the groin area.  Ice packs and reclining will help.  Swelling and bruising can take many days to resolve.  6. It is common to experience some constipation if taking pain medication after surgery.  Increasing fluid intake and taking a stool softener (such as Colace) will usually help or prevent this problem from occurring.  A mild laxative (Milk of Magnesia or Miralax) should be taken according to package directions if there are no bowel movements after 48 hours. 7. Unless discharge instructions indicate otherwise, you may remove your bandages 4 days after surgery, and you may shower at that time.  You may have steri-strips (small skin tapes) in place directly over the incision.  These strips should be left on the skin.  If your surgeon used skin glue on the incision, you may shower in 24 hours.  The glue will flake off over  the next 2-3 weeks.  Any sutures or staples will be removed at the office during your follow-up visit. 8. ACTIVITIES:  You may resume regular (light) daily activities beginning the next day--such as daily self-care, walking, climbing stairs--gradually increasing activities as tolerated.  You may have sexual intercourse when it is comfortable.  Refrain from any heavy lifting or straining-nothing over 10 pounds for 6 weeks.  a. You may drive when you are no longer taking prescription pain medication, you can comfortably wear a seatbelt, and you can safely maneuver your car and apply brakes. b. RETURN TO WORK:  Desk work/Light work in 1-2 weeks, full duty in 6 weeks._________________________________________________________ 9. You should see your doctor in the office for a follow-up appointment approximately 2-3 weeks after your surgery.  Make sure that you call for this appointment within a day or two after you arrive home to insure a convenient appointment time. 10. OTHER INSTRUCTIONS:  __________________________________________________________________________________________________________________________________________________________________________________________  WHEN TO CALL YOUR DOCTOR: 1. Fever over 101.0 2. Inability to urinate 3. Nausea and/or vomiting 4. Extreme swelling or bruising 5. Continued bleeding from incision. 6. Increased pain, redness, or drainage from the incision  The clinic staff is available to answer your questions during regular business hours.  Please dont hesitate to call and ask to speak to one of the nurses for clinical concerns.  If you have a medical emergency, go to the nearest emergency room or call 911.  A surgeon from [facility] is always on call at the  hospital   [address] [place], [place], [place]  [address] ?  [address], [place], [place]   [address] [phone] ? [phone] ? FAX [phone] Web site:  [email or web address]   Post Anesthesia Home Care Instructions  Activity: Get plenty of rest for the remainder of the day. A responsible individual must stay with you for 24 hours following the procedure.  For the next 24 hours, DO NOT: -Drive a car -Paediatric nurse -Drink alcoholic beverages -Take any medication unless instructed by your physician -Make any legal decisions or sign important papers.  Meals: Start with liquid foods such as gelatin or soup. Progress to regular foods as tolerated. Avoid greasy, spicy, heavy foods. If nausea and/or vomiting occur, drink only clear liquids until the nausea and/or vomiting subsides. Call your physician if vomiting continues.  Special Instructions/Symptoms: Your throat may feel dry or sore from the anesthesia or the breathing tube placed in your throat during surgery. If this causes discomfort, gargle with warm salt water. The discomfort should disappear within 24 hours.  If you had a scopolamine patch placed behind your ear for the management of post- operative nausea and/or vomiting:  1. The medication in the patch is effective for 72 hours, after which it should be removed.  Wrap patch in a tissue and discard in the trash. Wash hands thoroughly with soap and water. 2. You may remove the patch earlier than 72 hours if you experience unpleasant side effects which may include dry mouth, dizziness or visual disturbances. 3. Avoid touching the patch. Wash your hands with soap and water after contact with the patch.

## 2017-01-13 NOTE — Transfer of Care (Signed)
Immediate Anesthesia Transfer of Care Note  Patient: [name]  Procedure(s) Performed: Procedure(s) (LRB): LEFT INGUINAL HERNIA REPAIR WITH MESH (Left) INSERTION OF MESH (Left)  Patient Location: PACU  Anesthesia Type: General  Level of Consciousness: awake, oriented, sedated and patient cooperative  Airway & Oxygen Therapy: Patient Spontanous Breathing and Patient connected to face mask oxygen  Post-op Assessment: Report given to PACU RN and Post -op Vital signs reviewed and stable  Post vital signs: Reviewed and stable  Complications: No apparent anesthesia complications  Last Vitals:  Vitals:   01/13/17 1045 01/13/17 1212  BP: (!) 177/97 (P) 130/75  Pulse: 88 (P) 86  Resp: 18 (P) 14  Temp:  (P) 36.3 C

## 2017-01-13 NOTE — Anesthesia Postprocedure Evaluation (Signed)
Anesthesia Post Note  Patient: [name]  Procedure(s) Performed: Procedure(s) (LRB): LEFT INGUINAL HERNIA REPAIR WITH MESH (Left) INSERTION OF MESH (Left)  Patient location during evaluation: PACU Anesthesia Type: General Level of consciousness: awake and alert Pain management: pain level controlled Vital Signs Assessment: post-procedure vital signs reviewed and stable Respiratory status: spontaneous breathing, nonlabored ventilation, respiratory function stable and patient connected to nasal cannula oxygen Cardiovascular status: blood pressure returned to baseline and stable Postop Assessment: no signs of nausea or vomiting Anesthetic complications: no       Last Vitals:  Vitals:   01/13/17 1230 01/13/17 1245  BP: 131/77 130/88  Pulse: 89 87  Resp: 15 (!) 23  Temp:      Last Pain:  Vitals:   01/13/17 0945  TempSrc: Oral                 [name]

## 2017-01-13 NOTE — Anesthesia Procedure Notes (Signed)
Procedure Name: LMA Insertion Date/Time: 01/13/2017 11:14 AM Performed by: Denna Haggard D Pre-anesthesia Checklist: Patient identified, Emergency Drugs available, Suction available and Patient being monitored Patient Re-evaluated:Patient Re-evaluated prior to inductionOxygen Delivery Method: Circle system utilized Preoxygenation: Pre-oxygenation with 100% oxygen Intubation Type: IV induction Ventilation: Mask ventilation without difficulty LMA: LMA inserted LMA Size: 3.0 Number of attempts: 1 Airway Equipment and Method: Bite block Placement Confirmation: positive ETCO2 Tube secured with: Tape Dental Injury: Teeth and Oropharynx as per pre-operative assessment

## 2017-01-13 NOTE — Interval H&P Note (Signed)
History and Physical Interval Note:  01/13/2017 11:00 AM  [name]  has presented today for surgery, with the diagnosis of Left inguinal hernia  The various methods of treatment have been discussed with the patient and family. After consideration of risks, benefits and other options for treatment, the patient has consented to  Procedure(s): LEFT INGUINAL HERNIA REPAIR WITH MESH (Left) INSERTION OF MESH (Left) as a surgical intervention .  The patient's history has been reviewed, patient examined, no change in status, stable for surgery.  I have reviewed the patient's chart and labs.  Questions were answered to the patient's satisfaction.     [name] [name]

## 2017-01-14 ENCOUNTER — Encounter (HOSPITAL_BASED_OUTPATIENT_CLINIC_OR_DEPARTMENT_OTHER): Payer: Self-pay | Admitting: General Surgery

## 2017-02-04 ENCOUNTER — Other Ambulatory Visit: Payer: Self-pay | Admitting: Family Medicine

## 2017-02-18 ENCOUNTER — Telehealth: Payer: Self-pay | Admitting: Family Medicine

## 2017-02-18 NOTE — Telephone Encounter (Signed)
Pt would like to know if she needs to do any blood work before her prolia injection and also to see if the prolia has been approved by the insurance.

## 2017-02-20 NOTE — Telephone Encounter (Signed)
[name] has been helping to co-ordinate the Prolia injections (unless this has been re-assigned to someone else).  Would get BMP prior to injection.

## 2017-02-21 ENCOUNTER — Other Ambulatory Visit: Payer: Self-pay

## 2017-02-21 DIAGNOSIS — M810 Age-related osteoporosis without current pathological fracture: Secondary | ICD-10-CM

## 2017-02-21 NOTE — Telephone Encounter (Signed)
Thanks

## 2017-02-21 NOTE — Telephone Encounter (Signed)
Spoke with patient who is scheduled for Prolia injection on Thursday, Feb 26, 2017. I also entered in an order for her BMP which she will have drawn prior to receiving her injection.

## 2017-02-24 ENCOUNTER — Other Ambulatory Visit (INDEPENDENT_AMBULATORY_CARE_PROVIDER_SITE_OTHER): Payer: Medicare Other

## 2017-02-24 ENCOUNTER — Ambulatory Visit (INDEPENDENT_AMBULATORY_CARE_PROVIDER_SITE_OTHER): Payer: Medicare Other | Admitting: *Deleted

## 2017-02-24 DIAGNOSIS — M810 Age-related osteoporosis without current pathological fracture: Secondary | ICD-10-CM

## 2017-02-24 LAB — BASIC METABOLIC PANEL
BUN: 18 mg/dL (ref 6–23)
CO2: 29 mEq/L (ref 19–32)
Calcium: 9.4 mg/dL (ref 8.4–10.5)
Chloride: 102 mEq/L (ref 96–112)
Creatinine, Ser: 0.84 mg/dL (ref 0.40–1.20)
GFR: 68.82 mL/min (ref >60.00)
Glucose, Bld: 85 mg/dL (ref 70–99)
Potassium: 3.9 mEq/L (ref 3.5–5.1)
Sodium: 138 mEq/L (ref 135–145)

## 2017-02-24 MED ORDER — DENOSUMAB 60 MG/ML ~~LOC~~ SOLN
60.0000 mg | Freq: Once | SUBCUTANEOUS | Status: AC
  Administered 2017-02-24: 60 mg via SUBCUTANEOUS

## 2017-02-24 NOTE — Progress Notes (Signed)
Patient here for prolia injection, injection given SQ to rt arm, patient tolerated well, no s/s of reactions. Patient to return for next injection in 6 months.

## 2017-03-28 NOTE — Addendum Note (Signed)
Addendum  created 03/28/17 1233 by Myrtie Soman, MD   Sign clinical note

## 2017-03-28 NOTE — Anesthesia Postprocedure Evaluation (Signed)
Anesthesia Post Note  Patient: [name]  Procedure(s) Performed: Procedure(s) (LRB): LEFT INGUINAL HERNIA REPAIR WITH MESH (Left) INSERTION OF MESH (Left)     Anesthesia Post Evaluation  Last Vitals:  Vitals:   01/13/17 1245 01/13/17 1400  BP: 130/88 130/61  Pulse: 87 64  Resp: (!) 23 (!) 22  Temp:  36.5 C    Last Pain:  Vitals:   01/14/17 1323  TempSrc:   PainSc: 0-No pain                 Mykle Pascua S

## 2017-05-04 DIAGNOSIS — S0500XA Injury of conjunctiva and corneal abrasion without foreign body, unspecified eye, initial encounter: Secondary | ICD-10-CM | POA: Diagnosis not present

## 2017-07-28 DIAGNOSIS — Z23 Encounter for immunization: Secondary | ICD-10-CM | POA: Diagnosis not present

## 2017-08-10 DIAGNOSIS — H2513 Age-related nuclear cataract, bilateral: Secondary | ICD-10-CM | POA: Diagnosis not present

## 2017-08-29 ENCOUNTER — Telehealth: Payer: Self-pay | Admitting: Family Medicine

## 2017-08-29 DIAGNOSIS — M810 Age-related osteoporosis without current pathological fracture: Secondary | ICD-10-CM

## 2017-08-29 NOTE — Telephone Encounter (Signed)
Patient would like a call back regarding labs that need to be performed prior to her prolia injection. Patient needs to have prilia injection this month.

## 2017-08-29 NOTE — Telephone Encounter (Signed)
Pt made aware, lab appt made for 08/30/17.

## 2017-08-29 NOTE — Telephone Encounter (Signed)
Per Dr Mirna Mires to draw [place] [facility] in epic.

## 2017-08-30 ENCOUNTER — Other Ambulatory Visit (INDEPENDENT_AMBULATORY_CARE_PROVIDER_SITE_OTHER): Payer: Medicare Other

## 2017-08-30 DIAGNOSIS — M810 Age-related osteoporosis without current pathological fracture: Secondary | ICD-10-CM | POA: Diagnosis not present

## 2017-08-30 LAB — COMPREHENSIVE METABOLIC PANEL
ALT: 11 U/L (ref 0–35)
AST: 21 U/L (ref 0–37)
Albumin: 4.1 g/dL (ref 3.5–5.2)
Alkaline Phosphatase: 47 U/L (ref 39–117)
BUN: 19 mg/dL (ref 6–23)
CO2: 28 mEq/L (ref 19–32)
Calcium: 9.4 mg/dL (ref 8.4–10.5)
Chloride: 100 mEq/L (ref 96–112)
Creatinine, Ser: 0.75 mg/dL (ref 0.40–1.20)
GFR: 78.34 mL/min (ref >60.00)
Glucose, Bld: 89 mg/dL (ref 70–99)
Potassium: 4.1 mEq/L (ref 3.5–5.1)
Sodium: 136 mEq/L (ref 135–145)
Total Bilirubin: 0.8 mg/dL (ref 0.2–1.2)
Total Protein: 6.7 g/dL (ref 6.0–8.3)

## 2017-08-30 LAB — VITAMIN D 25 HYDROXY (VIT D DEFICIENCY, FRACTURES): VITD: 84.74 ng/mL (ref 30.00–100.00)

## 2017-08-31 ENCOUNTER — Other Ambulatory Visit: Payer: Self-pay | Admitting: Family Medicine

## 2017-09-05 ENCOUNTER — Telehealth: Payer: Self-pay

## 2017-09-05 NOTE — Telephone Encounter (Signed)
Patient has been waiting on call to schedule Prolia injection.   Paty - Please follow up with pt. Thanks!

## 2017-09-05 NOTE — Telephone Encounter (Signed)
Left message on voicemail to call office.  

## 2017-09-05 NOTE — Telephone Encounter (Signed)
Pt has been scheduled.  °

## 2017-09-07 ENCOUNTER — Ambulatory Visit (INDEPENDENT_AMBULATORY_CARE_PROVIDER_SITE_OTHER): Payer: Medicare Other

## 2017-09-07 DIAGNOSIS — M810 Age-related osteoporosis without current pathological fracture: Secondary | ICD-10-CM | POA: Diagnosis not present

## 2017-09-07 MED ORDER — DENOSUMAB 60 MG/ML ~~LOC~~ SOLN
60.0000 mg | Freq: Once | SUBCUTANEOUS | Status: AC
  Administered 2017-09-07: 60 mg via SUBCUTANEOUS

## 2017-10-27 ENCOUNTER — Other Ambulatory Visit: Payer: Self-pay

## 2017-10-27 MED ORDER — VITAMIN D (ERGOCALCIFEROL) 1.25 MG (50000 UNIT) PO CAPS: 50000.0000 [IU] | ORAL_CAPSULE | ORAL | 3 refills | Status: DC

## 2017-10-27 MED ORDER — CITRACAL PLUS PO TABS: 1.0000 | ORAL_TABLET | Freq: Every day | ORAL | 3 refills | Status: DC

## 2017-10-27 MED ORDER — GLUCOSAMINE-CHONDROITIN 250-200 MG PO TABS: 1.0000 | ORAL_TABLET | Freq: Every day | ORAL | 3 refills | Status: DC

## 2017-10-27 MED ORDER — FISH OIL 1000 MG PO CAPS: 2000.0000 mg | ORAL_CAPSULE | Freq: Two times a day (BID) | ORAL | 3 refills | Status: DC

## 2017-10-27 NOTE — Addendum Note (Signed)
Addended by: Westley Hummer B on: 10/27/2017 12:50 PM   Modules accepted: Orders

## 2017-11-07 ENCOUNTER — Encounter: Payer: Self-pay | Admitting: Family Medicine

## 2017-11-07 ENCOUNTER — Ambulatory Visit (INDEPENDENT_AMBULATORY_CARE_PROVIDER_SITE_OTHER): Payer: Medicare Other | Admitting: Family Medicine

## 2017-11-07 VITALS — BP 140/80 | HR 79 | Temp 98.2°F | Wt 119.7 lb

## 2017-11-07 DIAGNOSIS — R252 Cramp and spasm: Secondary | ICD-10-CM

## 2017-11-07 DIAGNOSIS — M810 Age-related osteoporosis without current pathological fracture: Secondary | ICD-10-CM | POA: Diagnosis not present

## 2017-11-07 DIAGNOSIS — B9789 Other viral agents as the cause of diseases classified elsewhere: Secondary | ICD-10-CM | POA: Diagnosis not present

## 2017-11-07 DIAGNOSIS — J069 Acute upper respiratory infection, unspecified: Secondary | ICD-10-CM | POA: Diagnosis not present

## 2017-11-07 NOTE — Patient Instructions (Signed)
Leg Cramps Leg cramps occur when a muscle or muscles tighten and you have no control over this tightening (involuntary muscle contraction). Muscle cramps can develop in any muscle, but the most common place is in the calf muscles of the leg. Those cramps can occur during exercise or when you are at rest. Leg cramps are painful, and they may last for a few seconds to a few minutes. Cramps may return several times before they finally stop. Usually, leg cramps are not caused by a serious medical problem. In many cases, the cause is not known. Some common causes include:  Overexertion.  Overuse from repetitive motions, or doing the same thing over and over.  Remaining in a certain position for a long period of time.  Improper preparation, form, or technique while performing a sport or an activity.  Dehydration.  Injury.  Side effects of some medicines.  Abnormally low levels of the salts and ions in your blood (electrolytes), especially potassium and calcium. These levels could be low if you are taking water pills (diuretics) or if you are pregnant.  Follow these instructions at home: Watch your condition for any changes. Taking the following actions may help to lessen any discomfort that you are feeling:  Stay well-hydrated. Drink enough fluid to keep your urine clear or pale yellow.  Try massaging, stretching, and relaxing the affected muscle. Do this for several minutes at a time.  For tight or tense muscles, use a warm towel, heating pad, or hot shower water directed to the affected area.  If you are sore or have pain after a cramp, applying ice to the affected area may relieve discomfort. ? Put ice in a plastic bag. ? Place a towel between your skin and the bag. ? Leave the ice on for 20 minutes, 2-3 times per day.  Avoid strenuous exercise for several days if you have been having frequent leg cramps.  Make sure that your diet includes the essential minerals for your muscles to  work normally.  Take medicines only as directed by your health care provider.  Contact a health care provider if:  Your leg cramps get more severe or more frequent, or they do not improve over time.  Your foot becomes cold, numb, or blue. This information is not intended to replace advice given to you by your health care provider. Make sure you discuss any questions you have with your health care provider. Document Released: 11/18/2004 Document Revised: 03/18/2016 Document Reviewed: 09/18/2014 Elsevier Interactive Patient Education  2018 Elsevier Inc.  

## 2017-11-07 NOTE — Progress Notes (Signed)
Subjective:     Patient ID: [name], female   DOB: [birth year], 84 y.o.   MRN: [id number]  HPI Patient seen today to discuss several things as follows  Onset last week of upper respiratory symptoms. She's had bilateral earache, sore throat, laryngitis, dry cough, nasal congestion and mild headaches. No facial pain. No fevers or chills. She took some Robitussin-DM last night without much improvement in cough  Osteoporosis. Last DEXA scan was almost 3 years ago. She has been on Prolia injections for the past 2 years and tolerating well. No recent falls. Needs follow-up DEXA scan this year  Bilateral leg cramps especially at night. Sometimes involving feet. She thinks she is drinking enough fluid. Minimal caffeine use. She does take Ziac for hypertension. No history of hypokalemia  Past Medical History:  Diagnosis Date  . Constipation   . Diverticulosis   . Hypertension   . Inguinal hernia, left   . Irritable bowel syndrome   . Osteoporosis   . UTI (lower urinary tract infection)    hx of frequent uti's   Past Surgical History:  Procedure Laterality Date  . APPENDECTOMY    . BREAST SURGERY Left    biopsy  . CERVICAL FUSION    . colon polyps    . HEMORRHOID SURGERY    . HIP SURGERY Right   . INGUINAL HERNIA REPAIR Left 01/13/2017   Procedure: LEFT INGUINAL HERNIA REPAIR WITH MESH;  Surgeon: Jackolyn Confer, MD;  Location: [facility];  Service: General;  Laterality: Left;  . INSERTION OF MESH Left 01/13/2017   Procedure: INSERTION OF MESH;  Surgeon: Jackolyn Confer, MD;  Location: [facility];  Service: General;  Laterality: Left;  . LUMBAR FUSION    . LUMBAR LAMINECTOMY    . partial resection of colon for tumor    . SHOULDER SURGERY Left   . TOTAL HIP ARTHROPLASTY     right    reports that  has never smoked. She uses smokeless tobacco. She reports that she does not drink alcohol or use drugs. family history includes Breast cancer in her  sister; Cancer in her father; Colon cancer in her mother; Heart disease in her brother; Liver cancer in her brother; Lung cancer in her father; Ovarian cancer in her sister. Allergies  Allergen Reactions  . Codeine     REACTION: headaches  . Flagyl [Metronidazole Hcl]     nausea     Review of Systems  Constitutional: Negative for chills, fatigue and fever.  HENT: Positive for congestion, ear pain and sore throat. Negative for ear discharge.   Eyes: Negative for visual disturbance.  Respiratory: Positive for cough. Negative for chest tightness, shortness of breath and wheezing.   Cardiovascular: Negative for chest pain, palpitations and leg swelling.  Neurological: Negative for dizziness, seizures, syncope, weakness, light-headedness and headaches.       Objective:   Physical Exam  Constitutional: She appears well-developed and well-nourished.  HENT:  Right Ear: External ear normal.  Mouth/Throat: Oropharynx is clear and moist.  Minimal cerumen left canal  Neck: Neck supple.  Cardiovascular: Normal rate and regular rhythm.  Good distal foot pulses bilaterally  Pulmonary/Chest: Effort normal and breath sounds normal. No respiratory distress. She has no wheezes. She has no rales.  Musculoskeletal: She exhibits no edema.  Lymphadenopathy:    She has no cervical adenopathy.       Assessment:     #1 probable viral URI with cough  #2 bilateral leg  cramps  #3 osteoporosis on Prolia injections    Plan:     -Symptomatic treatment for URI symptoms. Stay well-hydrated. Consider over-the-counter Mucinex for cough -Follow-up promptly for any fever or increased shortness of breath -Check basic metabolic panel and magnesium level -Set up repeat DEXA scan  Eulas Post MD [facility]

## 2017-11-08 LAB — BASIC METABOLIC PANEL
BUN: 19 mg/dL (ref 6–23)
CO2: 28 mEq/L (ref 19–32)
Calcium: 9.2 mg/dL (ref 8.4–10.5)
Chloride: 98 mEq/L (ref 96–112)
Creatinine, Ser: 0.82 mg/dL (ref 0.40–1.20)
GFR: 70.64 mL/min (ref >60.00)
Glucose, Bld: 89 mg/dL (ref 70–99)
Potassium: 4 mEq/L (ref 3.5–5.1)
Sodium: 134 mEq/L — ABNORMAL LOW (ref 135–145)

## 2017-11-08 LAB — MAGNESIUM: Magnesium: 2.2 mg/dL (ref 1.5–2.5)

## 2017-11-28 ENCOUNTER — Ambulatory Visit (INDEPENDENT_AMBULATORY_CARE_PROVIDER_SITE_OTHER)
Admission: RE | Admit: 2017-11-28 | Discharge: 2017-11-28 | Disposition: A | Discharge status: Home / Self Care | Payer: Medicare Other | Source: Ambulatory Visit | Attending: Family Medicine | Admitting: Family Medicine

## 2017-11-28 DIAGNOSIS — M810 Age-related osteoporosis without current pathological fracture: Secondary | ICD-10-CM

## 2017-12-02 ENCOUNTER — Telehealth: Payer: Self-pay | Admitting: Family Medicine

## 2017-12-02 MED ORDER — GLUCOSAMINE-CHONDROITIN 250-200 MG PO TABS: 1.0000 | ORAL_TABLET | Freq: Every day | ORAL | 3 refills | Status: DC

## 2017-12-02 MED ORDER — CITRACAL PLUS PO TABS: 1.0000 | ORAL_TABLET | Freq: Every day | ORAL | 3 refills | Status: DC

## 2017-12-02 MED ORDER — FISH OIL 1000 MG PO CAPS: 2000.0000 mg | ORAL_CAPSULE | Freq: Two times a day (BID) | ORAL | 3 refills | Status: DC

## 2017-12-02 MED ORDER — VITAMIN D (ERGOCALCIFEROL) 1.25 MG (50000 UNIT) PO CAPS: ORAL_CAPSULE | ORAL | 2 refills | Status: DC

## 2017-12-02 NOTE — Telephone Encounter (Signed)
Copied from [place]. Topic: Quick Communication - Rx Refill/Question >> Dec 02, 2017 12:01 PM Cleaster Corin, [place] wrote: Medication: glucosamine  chondroitin 250-200mg  tablet Citracal plus Fish oil 1000mg  caps Drisdol 50000 units   Has the patient contacted their pharmacy? yes  (Agent: If no, request that the patient contact the pharmacy for the refill.)   Preferred Pharmacy (with phone number or street name): [id number] [place], [place]. [address] [facility] [place] [address] Phone: [phone] Fax: [phone]     Agent: Please be advised that RX refills may take up to 3 business days. We ask that you follow-up with your pharmacy.

## 2017-12-02 NOTE — Telephone Encounter (Signed)
Rx sent 

## 2018-01-02 ENCOUNTER — Other Ambulatory Visit: Payer: Self-pay | Admitting: Family Medicine

## 2018-01-02 MED ORDER — BISOPROLOL-HYDROCHLOROTHIAZIDE 5-6.25 MG PO TABS: 1.0000 | ORAL_TABLET | Freq: Every day | ORAL | 1 refills | Status: DC

## 2018-01-02 NOTE — Telephone Encounter (Signed)
Refill request for historical medication: Ziac  LOV: 11/07/17 (Not listed as diagnosis- but noted in chart note)   PCP: [place]   [facility]

## 2018-01-02 NOTE — Telephone Encounter (Signed)
Copied from [place] [id number]. Topic: Quick Communication - Rx Refill/Question >> Jan 02, 2018 11:47 AM Clack, Janett Billow D wrote: Medication: bisoprolol-hydrochlorothiazide (ZIAC) 5-6.25 MG tablet [[id number]]  Order Details   Has the patient contacted their pharmacy? Yes.     (Agent: If no, request that the patient contact the pharmacy for the refill.)   Preferred Pharmacy (with phone number or street name): [id number] [place], [place]. [address] [phone] (Phone) [phone] (Fax)     Agent: Please be advised that RX refills may take up to 3 business days. We ask that you follow-up with your pharmacy.

## 2018-01-13 ENCOUNTER — Other Ambulatory Visit: Payer: Self-pay

## 2018-01-13 ENCOUNTER — Telehealth: Payer: Self-pay | Admitting: Family Medicine

## 2018-01-13 MED ORDER — BISOPROLOL-HYDROCHLOROTHIAZIDE 5-6.25 MG PO TABS: 1.0000 | ORAL_TABLET | Freq: Every day | ORAL | 1 refills | Status: DC

## 2018-01-13 NOTE — Telephone Encounter (Signed)
Copied from [place]. Topic: Quick Communication - Rx Refill/Question >> Jan 13, 2018  2:19 PM Cleaster Corin, [place] wrote: Medication: bisoprolol-hydrochlorothiazide [facility]) 5-6.25 MG tablet [[id number]]  Has the patient contacted their pharmacy? yes (Agent: If no, request that the patient contact the pharmacy for the refill.) Preferred Pharmacy (with phone number or street name): [id number] - [place], [place]. [address] [facility] [place] [address] Phone: [phone] Fax: [phone]   Agent: Please be advised that RX refills may take up to 3 business days. We ask that you follow-up with your pharmacy.

## 2018-01-30 ENCOUNTER — Encounter: Payer: Self-pay | Admitting: Family Medicine

## 2018-01-30 ENCOUNTER — Ambulatory Visit (INDEPENDENT_AMBULATORY_CARE_PROVIDER_SITE_OTHER): Payer: Medicare Other | Admitting: Family Medicine

## 2018-01-30 VITALS — BP 154/68 | HR 78 | Temp 98.1°F | Ht 65.5 in | Wt 120.4 lb

## 2018-01-30 DIAGNOSIS — K5732 Diverticulitis of large intestine without perforation or abscess without bleeding: Secondary | ICD-10-CM | POA: Diagnosis not present

## 2018-01-30 MED ORDER — AMOXICILLIN-POT CLAVULANATE 875-125 MG PO TABS
1.0000 | ORAL_TABLET | Freq: Two times a day (BID) | ORAL | 0 refills | Status: DC
Start: 2018-01-30 — End: 2018-03-14

## 2018-01-30 NOTE — Progress Notes (Signed)
   Subjective:    Patient ID: [name], female    DOB: 1932/07/03, 84 y.o.   MRN: [id number]  HPI Here for 4 days of LLQ pains and loose stools. No nausea or vomiting. No fever. No UTI symptoms.    Review of Systems  Constitutional: Negative.   Respiratory: Negative.   Cardiovascular: Negative.   Gastrointestinal: Positive for abdominal pain. Negative for abdominal distention, anal bleeding, blood in stool, constipation, diarrhea, nausea, rectal pain and vomiting.  Genitourinary: Negative.        Objective:   Physical Exam  Constitutional: She appears well-developed and well-nourished. No distress.  Cardiovascular: Normal rate, regular rhythm, normal heart sounds and intact distal pulses.  Pulmonary/Chest: Effort normal and breath sounds normal. No respiratory distress. She has no wheezes. She has no rales.  Abdominal: Soft. Bowel sounds are normal. She exhibits no distension and no mass. There is no rebound and no guarding.  Tender in the LLQ           Assessment & Plan:  Diverticulitis, treat with Augmentin. Recheck prn. Alysia Penna, MD

## 2018-02-19 ENCOUNTER — Encounter: Payer: Self-pay | Admitting: Family Medicine

## 2018-03-08 ENCOUNTER — Telehealth: Payer: Self-pay | Admitting: Family Medicine

## 2018-03-08 DIAGNOSIS — L820 Inflamed seborrheic keratosis: Secondary | ICD-10-CM | POA: Diagnosis not present

## 2018-03-08 DIAGNOSIS — D485 Neoplasm of uncertain behavior of skin: Secondary | ICD-10-CM | POA: Diagnosis not present

## 2018-03-08 DIAGNOSIS — B079 Viral wart, unspecified: Secondary | ICD-10-CM | POA: Diagnosis not present

## 2018-03-08 DIAGNOSIS — L821 Other seborrheic keratosis: Secondary | ICD-10-CM | POA: Diagnosis not present

## 2018-03-08 DIAGNOSIS — M810 Age-related osteoporosis without current pathological fracture: Secondary | ICD-10-CM

## 2018-03-08 NOTE — Telephone Encounter (Signed)
Copied from [place] [id number] [phone]. Topic: General - Other >> Mar 08, 2018  1:33 PM Lennox Solders wrote: Reason for CRM:pt is calling to see if she can schedule her prolia injection and blood work vit d and calcium prior to getting the injection

## 2018-03-08 NOTE — Telephone Encounter (Signed)
Okay for prolia to be scheduled.  Okay to order labs?

## 2018-03-09 ENCOUNTER — Ambulatory Visit: Payer: Self-pay

## 2018-03-09 ENCOUNTER — Encounter: Payer: Self-pay | Admitting: Family Medicine

## 2018-03-09 ENCOUNTER — Ambulatory Visit (INDEPENDENT_AMBULATORY_CARE_PROVIDER_SITE_OTHER): Payer: Medicare Other | Admitting: Family Medicine

## 2018-03-09 VITALS — BP 158/88 | HR 80 | Temp 98.3°F | Wt 120.0 lb

## 2018-03-09 DIAGNOSIS — R040 Epistaxis: Secondary | ICD-10-CM

## 2018-03-09 NOTE — Telephone Encounter (Signed)
Pt on schedule for this morning 03/19/2018 to see Dr. Sarajane Jews.

## 2018-03-09 NOTE — Telephone Encounter (Signed)
Yes.  Get BMP and 25- OH vit d

## 2018-03-09 NOTE — Telephone Encounter (Signed)
Pt. Reports she has had 3-4 nose bleeds in the past 3 days. Mainly the left nares. Has high BP and readings the past two days - 150/85 , 144/83. States has coughed up "blood clots " during these episodes and also felt chest "tightness." No chest pain or tightness today. Appointment made for this morning.  Reason for Disposition . [1] Bleeding recurs 3 or more times in 24 hours AND [2] direct pressure applied correctly  Answer Assessment - Initial Assessment Questions 1. AMOUNT OF BLEEDING: "How bad is the bleeding?" "How much blood was lost?" "Has the bleeding stopped?"   - MILD: needed a couple tissues   - MODERATE: needed many tissues   - SEVERE: large blood clots, soaked many tissues, lasted more than 30 minutes      Moderate 2. ONSET: "When did the nosebleed start?"      Yesterday 3. FREQUENCY: "How many nosebleeds have you had in the last 24 hours?"      1 4. RECURRENT SYMPTOMS: "Have there been other recent nosebleeds?" If so, ask: "How long did it take you to stop the bleeding?" "What worked best?"      Squeezed and put cotton in her nose 5. CAUSE: "What do you think caused this nosebleed?"     Unsure 6. LOCAL FACTORS: "Do you have any cold symptoms?", "Have you been rubbing or picking at your nose?"     Allergy  7. SYSTEMIC FACTORS: "Do you have high blood pressure or any bleeding problems?"     Yes 8. BLOOD THINNERS: "Do you take any blood thinners?" (e.g., coumadin, heparin, aspirin, Plavix)     No 9. OTHER SYMPTOMS: "Do you have any other symptoms?" (e.g., lightheadedness)     Chest tightness - not now 10. PREGNANCY: "Is there any chance you are pregnant?" "When was your last menstrual period?"       No  Protocols used: NOSEBLEED-A-AH

## 2018-03-09 NOTE — Telephone Encounter (Signed)
Pt stopped by before leaving wanting to check the status of her Prolia inj.  Pt is aware that Apolonio Schneiders and Dr. Elease Hashimoto is out of the office today and will return on 5/17/19Pt would like to have a call.

## 2018-03-09 NOTE — Progress Notes (Signed)
   Subjective:    Patient ID: [name], female    DOB: 01-22-1932, 84 y.o.   MRN: [id number]  HPI Here for a 3 day hx of nosebleeds from the left nostril. There is no discomfort. No recent trauma. She does not take aspirin or blood thinners. No other problems with bruising or bleeding.    Review of Systems  Constitutional: Negative.   HENT: Positive for nosebleeds. Negative for congestion, sinus pressure and sinus pain.   Eyes: Negative.   Respiratory: Negative.   Cardiovascular: Negative.   Hematological: Does not bruise/bleed easily.       Objective:   Physical Exam  Constitutional: She appears well-developed and well-nourished.  HENT:  Right Ear: External ear normal.  Left Ear: External ear normal.  Mouth/Throat: Oropharynx is clear and moist.  Her nasal septum is deviated to the right. Both nares are clear, no blood or clots are seen   Eyes: Conjunctivae are normal.  Neck: No thyromegaly present.  Pulmonary/Chest: Effort normal and breath sounds normal.  Lymphadenopathy:    She has no cervical adenopathy.          Assessment & Plan:  Epistaxis. I suggested she use saline nasal sprays QID for several days and avoid blowing the nose. Recheck prn.  Alysia Penna, MD

## 2018-03-10 NOTE — Telephone Encounter (Signed)
Left message on machine for patient okay to schedule Prolia injection and labs.  Labs orders placed.

## 2018-03-10 NOTE — Telephone Encounter (Signed)
Both appts for prolia and lab has been scheduled.

## 2018-03-14 ENCOUNTER — Other Ambulatory Visit (INDEPENDENT_AMBULATORY_CARE_PROVIDER_SITE_OTHER): Payer: Medicare Other

## 2018-03-14 ENCOUNTER — Encounter: Payer: Self-pay | Admitting: Family Medicine

## 2018-03-14 ENCOUNTER — Ambulatory Visit (INDEPENDENT_AMBULATORY_CARE_PROVIDER_SITE_OTHER): Payer: Medicare Other | Admitting: Family Medicine

## 2018-03-14 ENCOUNTER — Other Ambulatory Visit: Payer: Medicare Other

## 2018-03-14 ENCOUNTER — Ambulatory Visit: Payer: Medicare Other

## 2018-03-14 VITALS — BP 110/74 | HR 88 | Temp 98.3°F | Wt 119.7 lb

## 2018-03-14 DIAGNOSIS — M810 Age-related osteoporosis without current pathological fracture: Secondary | ICD-10-CM

## 2018-03-14 DIAGNOSIS — R040 Epistaxis: Secondary | ICD-10-CM | POA: Diagnosis not present

## 2018-03-14 DIAGNOSIS — R05 Cough: Secondary | ICD-10-CM

## 2018-03-14 DIAGNOSIS — J029 Acute pharyngitis, unspecified: Secondary | ICD-10-CM | POA: Diagnosis not present

## 2018-03-14 DIAGNOSIS — R059 Cough, unspecified: Secondary | ICD-10-CM

## 2018-03-14 LAB — BASIC METABOLIC PANEL
BUN: 20 mg/dL (ref 6–23)
CO2: 29 mEq/L (ref 19–32)
Calcium: 9 mg/dL (ref 8.4–10.5)
Chloride: 95 mEq/L — ABNORMAL LOW (ref 96–112)
Creatinine, Ser: 0.71 mg/dL (ref 0.40–1.20)
GFR: 83.34 mL/min (ref >60.00)
Glucose, Bld: 103 mg/dL — ABNORMAL HIGH (ref 70–99)
Potassium: 4 mEq/L (ref 3.5–5.1)
Sodium: 132 mEq/L — ABNORMAL LOW (ref 135–145)

## 2018-03-14 LAB — VITAMIN D 25 HYDROXY (VIT D DEFICIENCY, FRACTURES): VITD: 86.54 ng/mL (ref 30.00–100.00)

## 2018-03-14 MED ORDER — DENOSUMAB 60 MG/ML ~~LOC~~ SOSY
60.0000 mg | PREFILLED_SYRINGE | Freq: Once | SUBCUTANEOUS | Status: AC
  Administered 2018-03-14: 60 mg via SUBCUTANEOUS

## 2018-03-14 MED ORDER — AZITHROMYCIN 250 MG PO TABS: ORAL_TABLET | ORAL | 0 refills | Status: AC

## 2018-03-14 NOTE — Progress Notes (Signed)
Subjective:     Patient ID: [name], female   DOB: 08/11/1932, 84 y.o.   MRN: [id number]  HPI Patient seen with 2 week history of sore throat. She is now having cough productive of green sputum. She's had some intermittent loss of voice. She took some over-the-counter Claritin without much improvement in her nasal congestive symptoms. She thinks she may had some low-grade fever past couple days but is not sure. Intermittent mild headaches  She had recent episode of epistaxis left naris and has not had any bleeding since last Wednesday.  She has osteoporosis and needs Prolia injection today. Recent electrolytes were checked in January and normal  Past Medical History:  Diagnosis Date  . Constipation   . Diverticulosis   . Hypertension   . Inguinal hernia, left   . Irritable bowel syndrome   . Osteoporosis   . UTI (lower urinary tract infection)    hx of frequent uti's   Past Surgical History:  Procedure Laterality Date  . APPENDECTOMY    . BREAST SURGERY Left    biopsy  . CERVICAL FUSION    . colon polyps    . HEMORRHOID SURGERY    . HIP SURGERY Right   . INGUINAL HERNIA REPAIR Left 01/13/2017   Procedure: LEFT INGUINAL HERNIA REPAIR WITH MESH;  Surgeon: Jackolyn Confer, MD;  [facility];  Service: General;  Laterality: Left;  . INSERTION OF MESH Left 01/13/2017   Procedure: INSERTION OF MESH;  Surgeon: Jackolyn Confer, MD;  Location: [facility];  Service: General;  Laterality: Left;  . LUMBAR FUSION    . LUMBAR LAMINECTOMY    . partial resection of colon for tumor    . SHOULDER SURGERY Left   . TOTAL HIP ARTHROPLASTY     right    reports that she has never smoked. She uses smokeless tobacco. She reports that she does not drink alcohol or use drugs. family history includes Breast cancer in her sister; Cancer in her father; Colon cancer in her mother; Heart disease in her brother; Liver cancer in her brother; Lung cancer in  her father; Ovarian cancer in her sister. Allergies  Allergen Reactions  . Codeine     REACTION: headaches  . Flagyl [Metronidazole Hcl]     nausea     Review of Systems  Constitutional: Positive for fatigue. Negative for chills.  HENT: Positive for congestion, sinus pressure, sinus pain, sore throat and voice change. Negative for ear pain.   Respiratory: Positive for cough. Negative for shortness of breath and wheezing.   Neurological: Positive for headaches.       Objective:   Physical Exam  Constitutional: She appears well-developed and well-nourished.  HENT:  Right Ear: Tympanic membrane normal.  Left Ear: Tympanic membrane normal.  Nose: Rhinorrhea present. No epistaxis.  Mouth/Throat: Oropharynx is clear and moist and mucous membranes are normal.  Oropharynx unremarkable. She's had previous tonsillectomy  Neck: Neck supple.  Cardiovascular: Normal rate and regular rhythm.  Pulmonary/Chest: Effort normal and breath sounds normal. She has no wheezes. She has no rales.  Lymphadenopathy:    She has no cervical adenopathy.       Assessment:     #1   2 week history of sore throat. Question secondary to postnasal drip. Exam unremarkable-? Allergic postnasal drip versus acute sinusitis  #2 productive cough-nonfocal exam and currently afebrile  #3 recent epistaxis currently stable  #4 osteoporosis    Plan:     -  Avoid Flonase with recent nosebleed issues -Consider over-the-counter Allegra, Zyrtec, or Xyzal -Zithromax for 5 days -Stay well-hydrated -Prolia injection given and will repeat in 6 months  Eulas Post MD [facility]

## 2018-03-14 NOTE — Patient Instructions (Signed)
Consider OTC Allegra or Xyzal for nasal congestion symptoms.

## 2018-05-22 NOTE — Progress Notes (Addendum)
Subjective:   [name] is a 84 y.o. female who presents for Medicare Annual (Subsequent) preventive examination.  Reports health as good  Last OV 5/21 Tic bit x 3 to 4 weeks ago   One dtr 2 grandsons. 2 great grands and one on the way 4 living siblings; (counting herself)  Strong hx of cancer in the family  Traveled on motorcycles on vacation. Sold it 84 yo   Diet bMI 19  Chol/hdl 4 Diet is good Vegetables and fruits   Exercise Does Tai chi every Monday- does tai chi  Does exercise prior to getting out of bed House keeping  Walks a lot, works in the yard    Health Maintenance Due  Topic Date Due  . Samul Dada  04/02/2018   Discussed taking td with pertussis she she incurs an injury   Osteoporosis  - on Prolia now Dr. Elease Hashimoto following   Colonoscopy 06/2010  Last one was 5 years ago;  [facility]ler   Mammogram 09/2013 Last one was 2017    dexa 11/2017 -3.6  Broke her pelvis; broke her hip;   Cardiac Risk Factors include: advanced age (>52mn, >>36women)     Objective:     Vitals: Ht 5' 5.5" (1.664 m)   Wt 117 lb (53.1 kg)   BMI 19.17 kg/m   Body mass index is 19.17 kg/m.  Advanced Directives 05/23/2018 01/13/2017 01/05/2017 12/01/2016  Does Patient Have a Medical Advance Directive? Yes Yes Yes Yes  Type of Advance Directive - [place] will -  Does patient want to make changes to medical advance directive? - No - Patient declined No - Patient declined -  Copy of [place] Chart? - No - copy requested No - copy requested -  Would patient like information on creating a medical advance directive? - No - Patient declined No - Patient declined -    Tobacco Social History   Tobacco Use  Smoking Status Never Smoker  Smokeless Tobacco Current User     Ready to quit: Not Answered Counseling given: Not Answered   Clinical Intake:     Past Medical  History:  Diagnosis Date  . Constipation   . Diverticulosis   . Hypertension   . Inguinal hernia, left   . Irritable bowel syndrome   . Osteoporosis   . UTI (lower urinary tract infection)    hx of frequent uti's   Past Surgical History:  Procedure Laterality Date  . APPENDECTOMY    . BREAST SURGERY Left    biopsy  . CERVICAL FUSION    . colon polyps    . HEMORRHOID SURGERY    . HIP SURGERY Right   . INGUINAL HERNIA REPAIR Left 01/13/2017   Procedure: LEFT INGUINAL HERNIA REPAIR WITH MESH;  Surgeon: TJackolyn Confer MD;  [facility]  Service: General;  Laterality: Left;  . INSERTION OF MESH Left 01/13/2017   Procedure: INSERTION OF MESH;  Surgeon: TJackolyn Confer MD;  [facility]  Service: General;  Laterality: Left;  . LUMBAR FUSION    . LUMBAR LAMINECTOMY    . partial resection of colon for tumor    . SHOULDER SURGERY Left   . TOTAL HIP ARTHROPLASTY     right   Family History  Problem Relation Age of Onset  . Colon cancer Mother   . Cancer Father   . Lung cancer Father   .  Ovarian cancer Sister   . Breast cancer Sister   . Liver cancer Brother   . Heart disease Brother    Social History   Socioeconomic History  . Marital status: Married    Spouse name: Not on file  . Number of children: Not on file  . Years of education: Not on file  . Highest education level: Not on file  Occupational History  . Occupation: retired    Fish farm manager: RETIRED  Social Needs  . Financial resource strain: Not on file  . Food insecurity:    Worry: Not on file    Inability: Not on file  . Transportation needs:    Medical: Not on file    Non-medical: Not on file  Tobacco Use  . Smoking status: Never Smoker  . Smokeless tobacco: Current User  Substance and Sexual Activity  . Alcohol use: No  . Drug use: No  . Sexual activity: Not Currently  Lifestyle  . Physical activity:    Days per week: Not on file    Minutes per session:  Not on file  . Stress: Not on file  Relationships  . Social connections:    Talks on phone: Not on file    Gets together: Not on file    Attends religious service: Not on file    Active member of club or organization: Not on file    Attends meetings of clubs or organizations: Not on file    Relationship status: Not on file  Other Topics Concern  . 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 b complex vitamins tablet Take 1 tablet by mouth daily.    . bisoprolol-hydrochlorothiazide (ZIAC) 5-6.25 MG tablet Take 1 tablet by mouth daily.  Marland Kitchen denosumab (PROLIA) 60 MG/ML SOLN injection Inject 60 mg into the skin every 6 (six) months. Administer in upper arm, thigh, or abdomen  . Glucosamine-Chondroitin 250-200 MG TABS Take 1 tablet by mouth daily.  . hydrocortisone-pramoxine (ANALPRAM-HC) 2.5-1 % rectal cream Place rectally 2 (two) times daily.  . meloxicam (MOBIC) 15 MG tablet Take 1 tablet (15 mg total) by mouth daily as needed.  . Multiple Minerals-Vitamins (CITRACAL PLUS) TABS Take 1 tablet by mouth daily.  . Omega-3 Fatty Acids (FISH OIL) 1000 MG CAPS Take 2 capsules (2,000 mg total) by mouth 2 (two) times daily.  Marland Kitchen Specialty Vitamins Products (VITAMINS FOR HAIR PO) Take by mouth.  . Vitamin D, Ergocalciferol, (DRISDOL) 50000 units CAPS capsule Take 1 capsule (50,000 Units total) by mouth every 7 (seven) days.   No facility-administered encounter medications on file as of 05/23/2018.     Activities of Daily Living In your present state of health, do you have any difficulty performing the following activities: 05/23/2018  Hearing? N  Vision? N  Difficulty concentrating or making decisions? N  Walking or climbing stairs? N  Dressing or bathing? N  Doing errands, shopping? N  Preparing Food and eating ? N  Using the  Toilet? N  In the past six months, have you accidently leaked urine? N  Do you have problems with loss of bowel control? N  Managing your Medications? N  Managing your Finances? N  Housekeeping or managing your Housekeeping? N  Some recent data might be hidden  Patient Care Team: Eulas Post, MD as PCP - General (Family Medicine)    Assessment:   This is a routine wellness examination for [place].  Exercise Activities and Dietary recommendations Current Exercise Habits: Home exercise routine;Structured exercise class, Time (Minutes): 60, Frequency (Times/Week): 5, Weekly Exercise (Minutes/Week): 300, Intensity: Moderate  Goals    . Patient Stated     To maintain the same plan        Fall Risk Fall Risk  05/23/2018 12/01/2016 11/24/2015 11/24/2015 11/22/2014  Falls in the past year? Yes Yes Yes Yes No  Number falls in past yr: _0 -  Comment only tripped x 1  - - - -  Injury with Fall? No - Yes - -  Follow up Education provided Education provided Falls prevention discussed Falls evaluation completed;Education provided;Falls prevention discussed -     Depression Screen PHQ 2/9 Scores 05/23/2018 12/01/2016 11/24/2015 11/24/2015  PHQ - 2 Score 0 0 0 0     Cognitive Function MMSE - Mini Mental State Exam 05/23/2018  Not completed: (No Data)   Ad8 score reviewed for issues:  Issues making decisions:  Less interest in hobbies / activities:  Repeats questions, stories (family complaining):  Trouble using ordinary gadgets (microwave, computer, phone):  Forgets the month or year:   Mismanaging finances:   Remembering appts:  Daily problems with thinking and/or memory: Ad8 score is=0       6CIT Screen 12/01/2016  What Year? 0 points  What month? 0 points  What time? 0 points  Count back from 20 0 points  Months in reverse 0 points  Repeat phrase 2 points  Total Score 2    Immunization History  Administered Date(s) Administered  . H1N1 10/02/2008  .  Influenza Split 09/30/2011, 10/10/2012  . Influenza Whole 09/04/2007, 07/30/2008, 10/08/2009, 09/11/2010  . Influenza,inj,Quad PF,6+ Mos 07/11/2013  . Influenza-Unspecified 09/11/2014, 08/25/2016  . Pneumococcal Conjugate-13 11/22/2014  . Pneumococcal Polysaccharide-23 12/04/2007  . Td 04/02/2008  . Zoster 11/26/2013    Screening Tests Health Maintenance  Topic Date Due  . TETANUS/TDAP  04/02/2018  . INFLUENZA VACCINE  05/25/2018  . DEXA SCAN  Completed  . PNA vac Low Risk Adult  Completed         Plan:      PCP Notes   Health Maintenance Discussed taking td with pertussis she she incurs an injury   Osteoporosis  - on Prolia now Dr. Elease Hashimoto following - Dexa T-score was -3.6   Colonoscopy 06/2010 - hx of colon cancer in the family  but does not feel she will have another one.  Do not see repeat   Mammogram 09/2013 Last one was 2017 per her report. Does not want to have another but will discuss with Dr. Elease Hashimoto at her next visit    dexa 11/2017 -3.6    Abnormal Screens  none  Referrals  no  Patient concerns; Had a tic taken off the back of her knee on the left Today, it looks tiny bump. No redness or enduration Incubation for RMSF is 3 to 5 days and for lymes generally a rash will apear 30 days.  If you develop h/a, myalgia to call and come in. Appears to be clearing.    Nurse Concerns; As noted   Next PCP apt Just seen in May and next ov TBS        I have personally reviewed and noted the following in the patient's chart:   . Medical  and social history . Use of alcohol, tobacco or illicit drugs  . Current medications and supplements . Functional ability and status . Nutritional status . Physical activity . Advanced directives . List of other physicians . Hospitalizations, surgeries, and ER visits in previous 12 months . Vitals . Screenings to include cognitive, depression, and falls . Referrals and appointments  In addition, I have  reviewed and discussed with patient certain preventive protocols, quality metrics, and best practice recommendations. A written personalized care plan for preventive services as well as general preventive health recommendations were provided to patient.     [email or web address],ASUOR, RN  05/23/2018  I have reviewed the documentation for the [facility] and [place] provided by the health coach and agree with their documentation. I was immediately available for any questions  Eulas Post MD [facility]

## 2018-05-23 ENCOUNTER — Ambulatory Visit (INDEPENDENT_AMBULATORY_CARE_PROVIDER_SITE_OTHER): Payer: Medicare Other

## 2018-05-23 VITALS — Ht 65.5 in | Wt 117.0 lb

## 2018-05-23 DIAGNOSIS — Z0000 Encounter for general adult medical examination without abnormal findings: Secondary | ICD-10-CM

## 2018-05-23 NOTE — Patient Instructions (Addendum)
[name] , Thank you for taking time to come for your Medicare Wellness Visit. I appreciate your ongoing commitment to your health goals. Please review the following plan we discussed and let me know if I can assist you in the future.   Will schedule another Breast mammogram if Dr. Elease Hashimoto   Would like to have a copy of your Health Care POA for our chart and records   A Tetanus is recommended every 10 years. Medicare covers a tetanus if you have a cut or wound; otherwise, there may be a charge. If you had not had a tetanus with pertusses, known as the Tdap, you can take this anytime.    Shingrix is a vaccine for the prevention of Shingles in Adults 50 and older.  If you are on Medicare, the shingrix is covered under your Part D plan, so you will take both of the vaccines in the series at your pharmacy. Please check with your benefits regarding applicable copays or out of pocket expenses.  The Shingrix is given in 2 vaccines approx 8 weeks apart. You must receive the 2nd dose prior to 6 months from receipt of the first. Please have the pharmacist print out you Immunization  dates for our office records   These are the goals we discussed: Goals    . Patient Stated     To maintain the same plan        This is a list of the screening recommended for you and due dates:  Health Maintenance  Topic Date Due  . Tetanus Vaccine  04/02/2018  . Flu Shot  05/25/2018  . DEXA scan (bone density measurement)  Completed  . Pneumonia vaccines  Completed     Bone Densitometry Bone densitometry is an imaging test that uses a special X-ray to measure the amount of calcium and other minerals in your bones (bone density). This test is also known as a bone mineral density test or dual-energy X-ray absorptiometry (DXA). The test can measure bone density at your hip and your spine. It is similar to having a regular X-ray. You may have this test to:  Diagnose a condition that causes weak or  thin bones (osteoporosis).  Predict your risk of a broken bone (fracture).  Determine how well osteoporosis treatment is working.  Tell a health care provider about:  Any allergies you have.  All medicines you are taking, including vitamins, herbs, eye drops, creams, and over-the-counter medicines.  Any problems you or family members have had with anesthetic medicines.  Any blood disorders you have.  Any surgeries you have had.  Any medical conditions you have.  Possibility of pregnancy.  Any other medical test you had within the previous 14 days that used contrast material. What are the risks? Generally, this is a safe procedure. However, problems can occur and may include the following:  This test exposes you to a very small amount of radiation.  The risks of radiation exposure may be greater to unborn children.  What happens before the procedure?  Do not take any calcium supplements for 24 hours before having the test. You can otherwise eat and drink what you usually do.  Take off all metal jewelry, eyeglasses, dental appliances, and any other metal objects. What happens during the procedure?  You may lie on an exam table. There will be an X-ray generator below you and an imaging device above you.  Other devices, such as boxes or braces, may be used to position your  body properly for the scan.  You will need to lie still while the machine slowly scans your body.  The images will show up on a computer monitor. What happens after the procedure? You may need more testing at a later time. This information is not intended to replace advice given to you by your health care provider. Make sure you discuss any questions you have with your health care provider. Document Released: 11/02/2004 Document Revised: 03/18/2016 Document Reviewed: 03/21/2014 Elsevier Interactive Patient Education  2018 [place] in the Home Falls can cause injuries. They can  happen to people of all ages. There are many things you can do to make your home safe and to help prevent falls. What can I do on the outside of my home?  Regularly fix the edges of walkways and driveways and fix any cracks.  Remove anything that might make you trip as you walk through a door, such as a raised step or threshold.  Trim any bushes or trees on the path to your home.  Use bright outdoor lighting.  Clear any walking paths of anything that might make someone trip, such as rocks or tools.  Regularly check to see if handrails are loose or broken. Make sure that both sides of any steps have handrails.  Any raised decks and porches should have guardrails on the edges.  Have any leaves, snow, or ice cleared regularly.  Use sand or salt on walking paths during winter.  Clean up any spills in your garage right away. This includes oil or grease spills. What can I do in the bathroom?  Use night lights.  Install grab bars by the toilet and in the tub and shower. Do not use towel bars as grab bars.  Use non-skid mats or decals in the tub or shower.  If you need to sit down in the shower, use a plastic, non-slip stool.  Keep the floor dry. Clean up any water that spills on the floor as soon as it happens.  Remove soap buildup in the tub or shower regularly.  Attach bath mats securely with double-sided non-slip rug tape.  Do not have throw rugs and other things on the floor that can make you trip. What can I do in the bedroom?  Use night lights.  Make sure that you have a light by your bed that is easy to reach.  Do not use any sheets or blankets that are too big for your bed. They should not hang down onto the floor.  Have a firm chair that has side arms. You can use this for support while you get dressed.  Do not have throw rugs and other things on the floor that can make you trip. What can I do in the kitchen?  Clean up any spills right away.  Avoid walking on  wet floors.  Keep items that you use a lot in easy-to-reach places.  If you need to reach something above you, use a strong step stool that has a grab bar.  Keep electrical cords out of the way.  Do not use floor polish or wax that makes floors slippery. If you must use wax, use non-skid floor wax.  Do not have throw rugs and other things on the floor that can make you trip. What can I do with my stairs?  Do not leave any items on the stairs.  Make sure that there are handrails on both sides of the stairs and use  them. Fix handrails that are broken or loose. Make sure that handrails are as long as the stairways.  Check any carpeting to make sure that it is firmly attached to the stairs. Fix any carpet that is loose or worn.  Avoid having throw rugs at the top or bottom of the stairs. If you do have throw rugs, attach them to the floor with carpet tape.  Make sure that you have a light switch at the top of the stairs and the bottom of the stairs. If you do not have them, ask someone to add them for you. What else can I do to help prevent falls?  Wear shoes that: ? Do not have high heels. ? Have rubber bottoms. ? Are comfortable and fit you well. ? Are closed at the toe. Do not wear sandals.  If you use a stepladder: ? Make sure that it is fully opened. Do not climb a closed stepladder. ? Make sure that both sides of the stepladder are locked into place. ? Ask someone to hold it for you, if possible.  Clearly mark and make sure that you can see: ? Any grab bars or handrails. ? First and last steps. ? Where the edge of each step is.  Use tools that help you move around (mobility aids) if they are needed. These include: ? Canes. ? Walkers. ? Scooters. ? Crutches.  Turn on the lights when you go into a dark area. Replace any light bulbs as soon as they burn out.  Set up your furniture so you have a clear path. Avoid moving your furniture around.  If any of your floors are  uneven, fix them.  If there are any pets around you, be aware of where they are.  Review your medicines with your doctor. Some medicines can make you feel dizzy. This can increase your chance of falling. Ask your doctor what other things that you can do to help prevent falls. This information is not intended to replace advice given to you by your health care provider. Make sure you discuss any questions you have with your health care provider. Document Released: 08/07/2009 Document Revised: 03/18/2016 Document Reviewed: 11/15/2014 Elsevier Interactive Patient Education  2018 [place] Maintenance, Female Adopting a healthy lifestyle and getting preventive care can go a long way to promote health and wellness. Talk with your health care provider about what schedule of regular examinations is right for you. This is a good chance for you to check in with your provider about disease prevention and staying healthy. In between checkups, there are plenty of things you can do on your own. Experts have done a lot of research about which lifestyle changes and preventive measures are most likely to keep you healthy. Ask your health care provider for more information. Weight and diet Eat a healthy diet  Be sure to include plenty of vegetables, fruits, low-fat dairy products, and lean protein.  Do not eat a lot of foods high in solid fats, added sugars, or salt.  Get regular exercise. This is one of the most important things you can do for your health. ? Most adults should exercise for at least 150 minutes each week. The exercise should increase your heart rate and make you sweat (moderate-intensity exercise). ? Most adults should also do strengthening exercises at least twice a week. This is in addition to the moderate-intensity exercise.  Maintain a healthy weight  Body mass index (BMI) is a measurement that can be  used to identify possible weight problems. It estimates body fat based on  height and weight. Your health care provider can help determine your BMI and help you achieve or maintain a healthy weight.  For females 37 years of age and older: ? A BMI below 18.5 is considered underweight. ? A BMI of 18.5 to 24.9 is normal. ? A BMI of 25 to 29.9 is considered overweight. ? A BMI of 30 and above is considered obese.  Watch levels of cholesterol and blood lipids  You should start having your blood tested for lipids and cholesterol at 82 years of age, then have this test every 5 years.  You may need to have your cholesterol levels checked more often if: ? Your lipid or cholesterol levels are high. ? You are older than 82 years of age. ? You are at high risk for heart disease.  Cancer screening Lung Cancer  Lung cancer screening is recommended for adults 36-92 years old who are at high risk for lung cancer because of a history of smoking.  A yearly low-dose CT scan of the lungs is recommended for people who: ? Currently smoke. ? Have quit within the past 15 years. ? Have at least a 30-pack-year history of smoking. A pack year is smoking an average of one pack of cigarettes a day for 1 year.  Yearly screening should continue until it has been 15 years since you quit.  Yearly screening should stop if you develop a health problem that would prevent you from having lung cancer treatment.  Breast Cancer  Practice breast self-awareness. This means understanding how your breasts normally appear and feel.  It also means doing regular breast self-exams. Let your health care provider know about any changes, no matter how small.  If you are in your 20s or 30s, you should have a clinical breast exam (CBE) by a health care provider every 1-3 years as part of a regular health exam.  If you are 66 or older, have a CBE every year. Also consider having a breast X-ray (mammogram) every year.  If you have a family history of breast cancer, talk to your health care provider  about genetic screening.  If you are at high risk for breast cancer, talk to your health care provider about having an MRI and a mammogram every year.  Breast cancer gene (BRCA) assessment is recommended for women who have family members with BRCA-related cancers. BRCA-related cancers include: ? Breast. ? Ovarian. ? Tubal. ? Peritoneal cancers.  Results of the assessment will determine the need for genetic counseling and BRCA1 and BRCA2 testing.  Cervical Cancer Your health care provider may recommend that you be screened regularly for cancer of the pelvic organs (ovaries, uterus, and vagina). This screening involves a pelvic examination, including checking for microscopic changes to the surface of your cervix (Pap test). You may be encouraged to have this screening done every 3 years, beginning at age 87.  For women ages 51-65, health care providers may recommend pelvic exams and Pap testing every 3 years, or they may recommend the Pap and pelvic exam, combined with testing for human papilloma virus (HPV), every 5 years. Some types of HPV increase your risk of cervical cancer. Testing for HPV may also be done on women of any age with unclear Pap test results.  Other health care providers may not recommend any screening for nonpregnant women who are considered low risk for pelvic cancer and who do not have symptoms. Ask  your health care provider if a screening pelvic exam is right for you.  If you have had past treatment for cervical cancer or a condition that could lead to cancer, you need Pap tests and screening for cancer for at least 20 years after your treatment. If Pap tests have been discontinued, your risk factors (such as having a new sexual partner) need to be reassessed to determine if screening should resume. Some women have medical problems that increase the chance of getting cervical cancer. In these cases, your health care provider may recommend more frequent screening and Pap  tests.  Colorectal Cancer  This type of cancer can be detected and often prevented.  Routine colorectal cancer screening usually begins at 82 years of age and continues through 82 years of age.  Your health care provider may recommend screening at an earlier age if you have risk factors for colon cancer.  Your health care provider may also recommend using home test kits to check for hidden blood in the stool.  A small camera at the end of a tube can be used to examine your colon directly (sigmoidoscopy or colonoscopy). This is done to check for the earliest forms of colorectal cancer.  Routine screening usually begins at age 54.  Direct examination of the colon should be repeated every 5-10 years through 82 years of age. However, you may need to be screened more often if early forms of precancerous polyps or small growths are found.  Skin Cancer  Check your skin from head to toe regularly.  Tell your health care provider about any new moles or changes in moles, especially if there is a change in a mole's shape or color.  Also tell your health care provider if you have a mole that is larger than the size of a pencil eraser.  Always use sunscreen. Apply sunscreen liberally and repeatedly throughout the day.  Protect yourself by wearing long sleeves, pants, a wide-brimmed hat, and sunglasses whenever you are outside.  Heart disease, diabetes, and high blood pressure  High blood pressure causes heart disease and increases the risk of stroke. High blood pressure is more likely to develop in: ? People who have blood pressure in the high end of the normal range (130-139/85-89 mm Hg). ? People who are overweight or obese. ? People who are African American.  If you are 73-44 years of age, have your blood pressure checked every 3-5 years. If you are 15 years of age or older, have your blood pressure checked every year. You should have your blood pressure measured twice-once when you are at  a hospital or clinic, and once when you are not at a hospital or clinic. Record the average of the two measurements. To check your blood pressure when you are not at a hospital or clinic, you can use: ? An automated blood pressure machine at a pharmacy. ? A home blood pressure monitor.  If you are between 90 years and 37 years old, ask your health care provider if you should take aspirin to prevent strokes.  Have regular diabetes screenings. This involves taking a blood sample to check your fasting blood sugar level. ? If you are at a normal weight and have a low risk for diabetes, have this test once every three years after 82 years of age. ? If you are overweight and have a high risk for diabetes, consider being tested at a younger age or more often. Preventing infection Hepatitis B  If you have  a higher risk for hepatitis B, you should be screened for this virus. You are considered at high risk for hepatitis B if: ? You were born in a country where hepatitis B is common. Ask your health care provider which countries are considered high risk. ? Your parents were born in a high-risk country, and you have not been immunized against hepatitis B (hepatitis B vaccine). ? You have HIV or AIDS. ? You use needles to inject street drugs. ? You live with someone who has hepatitis B. ? You have had sex with someone who has hepatitis B. ? You get hemodialysis treatment. ? You take certain medicines for conditions, including cancer, organ transplantation, and autoimmune conditions.  Hepatitis C  Blood testing is recommended for: ? Everyone born from [birth year] through 1963. ? Anyone with known risk factors for hepatitis C.  Sexually transmitted infections (STIs)  You should be screened for sexually transmitted infections (STIs) including gonorrhea and chlamydia if: ? You are sexually active and are younger than 82 years of age. ? You are older than 82 years of age and your health care provider tells  you that you are at risk for this type of infection. ? Your sexual activity has changed since you were last screened and you are at an increased risk for chlamydia or gonorrhea. Ask your health care provider if you are at risk.  If you do not have HIV, but are at risk, it may be recommended that you take a prescription medicine daily to prevent HIV infection. This is called pre-exposure prophylaxis (PrEP). You are considered at risk if: ? You are sexually active and do not regularly use condoms or know the HIV status of your partner(s). ? You take drugs by injection. ? You are sexually active with a partner who has HIV.  Talk with your health care provider about whether you are at high risk of being infected with HIV. If you choose to begin PrEP, you should first be tested for HIV. You should then be tested every 3 months for as long as you are taking PrEP. Pregnancy  If you are premenopausal and you may become pregnant, ask your health care provider about preconception counseling.  If you may become pregnant, take 400 to 800 micrograms (mcg) of folic acid every day.  If you want to prevent pregnancy, talk to your health care provider about birth control (contraception). Osteoporosis and menopause  Osteoporosis is a disease in which the bones lose minerals and strength with aging. This can result in serious bone fractures. Your risk for osteoporosis can be identified using a bone density scan.  If you are 45 years of age or older, or if you are at risk for osteoporosis and fractures, ask your health care provider if you should be screened.  Ask your health care provider whether you should take a calcium or vitamin D supplement to lower your risk for osteoporosis.  Menopause may have certain physical symptoms and risks.  Hormone replacement therapy may reduce some of these symptoms and risks. Talk to your health care provider about whether hormone replacement therapy is right for  you. Follow these instructions at home:  Schedule regular health, dental, and eye exams.  Stay current with your immunizations.  Do not use any tobacco products including cigarettes, chewing tobacco, or electronic cigarettes.  If you are pregnant, do not drink alcohol.  If you are breastfeeding, limit how much and how often you drink alcohol.  Limit alcohol intake to no  more than 1 drink per day for nonpregnant women. One drink equals 12 ounces of beer, 5 ounces of wine, or 1 ounces of hard liquor.  Do not use street drugs.  Do not share needles.  Ask your health care provider for help if you need support or information about quitting drugs.  Tell your health care provider if you often feel depressed.  Tell your health care provider if you have ever been abused or do not feel safe at home. This information is not intended to replace advice given to you by your health care provider. Make sure you discuss any questions you have with your health care provider. Document Released: 04/26/2011 Document Revised: 03/18/2016 Document Reviewed: 07/15/2015 Elsevier Interactive Patient Education  [employer].

## 2018-06-21 ENCOUNTER — Encounter: Payer: Self-pay | Admitting: Family Medicine

## 2018-06-21 DIAGNOSIS — S20211A Contusion of right front wall of thorax, initial encounter: Secondary | ICD-10-CM | POA: Diagnosis not present

## 2018-06-21 DIAGNOSIS — S46911A Strain of unspecified muscle, fascia and tendon at shoulder and upper arm level, right arm, initial encounter: Secondary | ICD-10-CM | POA: Diagnosis not present

## 2018-06-21 DIAGNOSIS — S40021A Contusion of right upper arm, initial encounter: Secondary | ICD-10-CM | POA: Diagnosis not present

## 2018-06-22 ENCOUNTER — Encounter: Payer: Self-pay | Admitting: Family Medicine

## 2018-06-22 ENCOUNTER — Ambulatory Visit (INDEPENDENT_AMBULATORY_CARE_PROVIDER_SITE_OTHER): Payer: Medicare Other | Admitting: Family Medicine

## 2018-06-22 VITALS — BP 120/70 | HR 82 | Temp 98.2°F | Wt 119.1 lb

## 2018-06-22 DIAGNOSIS — S40021A Contusion of right upper arm, initial encounter: Secondary | ICD-10-CM | POA: Diagnosis not present

## 2018-06-22 DIAGNOSIS — S2231XA Fracture of one rib, right side, initial encounter for closed fracture: Secondary | ICD-10-CM

## 2018-06-22 MED ORDER — MELOXICAM 15 MG PO TABS: 15.0000 mg | ORAL_TABLET | Freq: Every day | ORAL | 5 refills | Status: DC | PRN

## 2018-06-22 NOTE — Patient Instructions (Signed)
Rib Fracture ° °A rib fracture is a break or crack in one of the bones of the ribs. The ribs are a group of long, curved bones that wrap around your chest and attach to your spine. They protect your lungs and other organs in the chest cavity. A broken or cracked rib is often painful, but most do not cause other problems. Most rib fractures heal on their own over time. However, rib fractures can be more serious if multiple ribs are broken or if broken ribs move out of place and push against other structures. °What are the causes? °· A direct blow to the chest. For example, this could happen during contact sports, a car accident, or a fall against a hard object. °· Repetitive movements with high force, such as pitching a baseball or having severe coughing spells. °What are the signs or symptoms? °· Pain when you breathe in or cough. °· Pain when someone presses on the injured area. °How is this diagnosed? °Your caregiver will perform a physical exam. Various imaging tests may be ordered to confirm the diagnosis and to look for related injuries. These tests may include a chest X-ray, computed tomography (CT), magnetic resonance imaging (MRI), or a bone scan. °How is this treated? °Rib fractures usually heal on their own in 1-3 months. The longer healing period is often associated with a continued cough or other aggravating activities. During the healing period, pain control is very important. Medication is usually given to control pain. Hospitalization or surgery may be needed for more severe injuries, such as those in which multiple ribs are broken or the ribs have moved out of place. °Follow these instructions at home: °· Avoid strenuous activity and any activities or movements that cause pain. Be careful during activities and avoid bumping the injured rib. °· Gradually increase activity as directed by your caregiver. °· Only take over-the-counter or prescription medications as directed by your caregiver. Do not take  other medications without asking your caregiver first. °· Apply ice to the injured area for the first 1-2 days after you have been treated or as directed by your caregiver. Applying ice helps to reduce inflammation and pain. °? Put ice in a plastic bag. °? Place a towel between your skin and the bag. °? Leave the ice on for 15-20 minutes at a time, every 2 hours while you are awake. °· Perform deep breathing as directed by your caregiver. This will help prevent pneumonia, which is a common complication of a broken rib. Your caregiver may instruct you to: °? Take deep breaths several times a day. °? Try to cough several times a day, holding a pillow against the injured area. °? Use a device called an incentive spirometer to practice deep breathing several times a day. °· Drink enough fluids to keep your urine clear or pale yellow. This will help you avoid constipation. °· Do not wear a rib belt or binder. These restrict breathing, which can lead to pneumonia. °Get help right away if: °· You have a fever. °· You have difficulty breathing or shortness of breath. °· You develop a continual cough, or you cough up thick or bloody sputum. °· You feel sick to your stomach (nausea), throw up (vomit), or have abdominal pain. °· You have worsening pain not controlled with medications. °This information is not intended to replace advice given to you by your health care provider. Make sure you discuss any questions you have with your health care provider. °Document Released: 10/11/2005   Document Revised: 03/18/2016 Document Reviewed: 12/13/2012 °Elsevier Interactive Patient Education © 2018 [employer] ° °

## 2018-06-22 NOTE — Progress Notes (Signed)
Subjective:     Patient ID: [name], female   DOB: 10-29-1931, 84 y.o.   MRN: [id number]  HPI Patient seen for follow-up regarding recent injury. Yesterday she went out into her garage to go to a freezer. Her car was parked very close to the freezer. She accidentally ran into the mirror with her right arm and that threw her off balance and she fell into the freezer with her right side. She noted some right arm pain and right side pain. She went to urgent care in [place], [place]. X-rays of the shoulder revealed no fracture. She had some degenerative changes of AC joint and mild joint space narrowing. She had rib films which showed nondisplaced fracture posterior lateral aspect of the right seventh rib. No pneumothorax. No effusion.  No loss of consciousness. No head injury. She has taken Tylenol for her rib pain which is working fairly well. She has taken Meloxicam in the past.  She is able to move her right shoulder fairly well. She has small hematoma the right arm. Denies any lower extremity injury. No cough. No fever. No dyspnea.  No headache.  She has osteoporosis and is on Prolia injections every 6 months  Past Medical History:  Diagnosis Date  . Constipation   . Diverticulosis   . Hypertension   . Inguinal hernia, left   . Irritable bowel syndrome   . Osteoporosis   . UTI (lower urinary tract infection)    hx of frequent uti's   Past Surgical History:  Procedure Laterality Date  . APPENDECTOMY    . BREAST SURGERY Left    biopsy  . CERVICAL FUSION    . colon polyps    . HEMORRHOID SURGERY    . HIP SURGERY Right   . INGUINAL HERNIA REPAIR Left 01/13/2017   Procedure: LEFT INGUINAL HERNIA REPAIR WITH MESH;  Surgeon: Jackolyn Confer, MD;  Location: [facility];  Service: General;  Laterality: Left;  . INSERTION OF MESH Left 01/13/2017   Procedure: INSERTION OF MESH;  Surgeon: Jackolyn Confer, MD;  Location: [facility];  Service: General;   Laterality: Left;  . LUMBAR FUSION    . LUMBAR LAMINECTOMY    . partial resection of colon for tumor    . SHOULDER SURGERY Left   . TOTAL HIP ARTHROPLASTY     right    reports that she has never smoked. She uses smokeless tobacco. She reports that she does not drink alcohol or use drugs. family history includes Breast cancer in her sister; Cancer in her father; Colon cancer in her mother; Heart disease in her brother; Liver cancer in her brother; Lung cancer in her father; Ovarian cancer in her sister. Allergies  Allergen Reactions  . Codeine     REACTION: headaches  . Flagyl [Metronidazole Hcl]     nausea     Review of Systems  Constitutional: Negative for chills and fever.  Respiratory: Negative for cough, shortness of breath and wheezing.   Cardiovascular: Negative for chest pain.  Gastrointestinal: Negative for abdominal pain.  Neurological: Negative for dizziness and headaches.  Psychiatric/Behavioral: Negative for confusion.       Objective:   Physical Exam  Constitutional: She is oriented to person, place, and time. She appears well-developed and well-nourished.  Cardiovascular: Normal rate and regular rhythm.  Pulmonary/Chest: Effort normal and breath sounds normal.  Musculoskeletal: She exhibits no edema.  Right shoulder full range of motion. Right elbow full range of motion. She has  small hematoma right lateral distal arm. No skin breakdown. Minimally tender.  She has small bruise approximately 3 x 3 cm with local tenderness right posterior lateral seventh rib region  Neurological: She is alert and oriented to person, place, and time.       Assessment:     Status post fall with contusion/hematoma right arm and right seventh rib fracture. No other injuries reported. She does have underlying osteoporosis. We discussed the following issues    Plan:     -continue Tylenol up to 2 every 6 hours as needed for pain -We've recommended that she focus on taking some  good deep breaths several times daily -Avoid heavy lifting -She is aware rib fracture will take several months to fully heal -Continue ice as needed for any discomfort for right arm or right seventh rib region -Refill meloxicam but use sparingly if not getting adequate relief with Tylenol -follow-up for any fever, shortness of breath, or increasing pain  Eulas Post MD [facility]

## 2018-07-08 ENCOUNTER — Other Ambulatory Visit: Payer: Self-pay | Admitting: Family Medicine

## 2018-08-09 ENCOUNTER — Other Ambulatory Visit: Payer: Self-pay | Admitting: Family Medicine

## 2018-08-14 ENCOUNTER — Ambulatory Visit (INDEPENDENT_AMBULATORY_CARE_PROVIDER_SITE_OTHER): Payer: Medicare Other | Admitting: Family Medicine

## 2018-08-14 ENCOUNTER — Encounter: Payer: Self-pay | Admitting: Family Medicine

## 2018-08-14 VITALS — BP 126/78 | HR 84 | Temp 97.7°F | Wt 117.0 lb

## 2018-08-14 DIAGNOSIS — R350 Frequency of micturition: Secondary | ICD-10-CM

## 2018-08-14 DIAGNOSIS — N3001 Acute cystitis with hematuria: Secondary | ICD-10-CM

## 2018-08-14 LAB — POC URINALSYSI DIPSTICK (AUTOMATED)
Bilirubin, UA: NEGATIVE
Glucose, UA: NEGATIVE
Ketones, UA: NEGATIVE
Nitrite, UA: NEGATIVE
Protein, UA: POSITIVE — AB
Spec Grav, UA: 1.015 (ref 1.010–1.025)
Urobilinogen, UA: 0.2 E.U./dL
pH, UA: 7 (ref 5.0–8.0)

## 2018-08-14 MED ORDER — CEPHALEXIN 500 MG PO CAPS: 500.0000 mg | ORAL_CAPSULE | Freq: Three times a day (TID) | ORAL | 0 refills | Status: AC

## 2018-08-14 NOTE — Patient Instructions (Signed)

## 2018-08-14 NOTE — Progress Notes (Signed)
Subjective:    Patient ID: [name], female    DOB: [birth year], 84 y.o.   MRN: [id number]  No chief complaint on file.   HPI Patient was seen today for acute concern.  Pt endorses urinary frequency, pressure, burning, mild odor since Friday.  Pt endorses symptoms worse in the mornings.  Pt has increased p.o. intake of water.  She denies fever, chills, nausea, vomiting, back pain.  Of note pt does have right rib cage soreness s/p seventh rib fracture 1 month ago.  Past Medical History:  Diagnosis Date  . Constipation   . Diverticulosis   . Hypertension   . Inguinal hernia, left   . Irritable bowel syndrome   . Osteoporosis   . UTI (lower urinary tract infection)    hx of frequent uti's    Allergies  Allergen Reactions  . Codeine     REACTION: headaches  . Flagyl [Metronidazole Hcl]     nausea    ROS General: Denies fever, chills, night sweats, changes in weight, changes in appetite HEENT: Denies headaches, ear pain, changes in vision, rhinorrhea, sore throat CV: Denies CP, palpitations, SOB, orthopnea Pulm: Denies SOB, cough, wheezing GI: Denies abdominal pain, nausea, vomiting, diarrhea, constipation GU: Denies dysuria, hematuria, frequency, vaginal discharge  + dysuria, frequency, pressure, slight odor Msk: Denies muscle cramps, joint pains Neuro: Denies weakness, numbness, tingling Skin: Denies rashes, bruising Psych: Denies depression, anxiety, hallucinations     Objective:    Blood pressure 126/78, pulse 84, temperature 97.7 F (36.5 C), temperature source Oral, weight 117 lb (53.1 kg), SpO2 98 %.   Gen. Pleasant, well-nourished, in no distress, normal affect   HEENT: Green/AT, face symmetric, no scleral icterus, PERRLA, nares patent without drainage Lungs: no accessory muscle use Cardiovascular: RRR, no peripheral edema Abdomen: BS present, soft, NT/ND Neuro:  A&Ox3, CN II-XII intact, normal gait    Wt Readings from Last 3 Encounters:  08/14/18  117 lb (53.1 kg)  06/22/18 119 lb 1.6 oz (54 kg)  05/23/18 117 lb (53.1 kg)    Lab Results  Component Value Date   WBC 10.0 05/21/2013   HGB 12.2 01/13/2017   HCT 36.0 01/13/2017   PLT 171.0 05/21/2013   GLUCOSE 103 (H) 03/14/2018   CHOL 205 (H) 05/21/2013   TRIG 95.0 05/21/2013   HDL 58.40 05/21/2013   LDLDIRECT 125.1 05/21/2013   LDLCALC 110 (H) 04/14/2012   ALT 11 08/30/2017   AST 21 08/30/2017   NA 132 (L) 03/14/2018   K 4.0 03/14/2018   CL 95 (L) 03/14/2018   CREATININE 0.71 03/14/2018   BUN 20 03/14/2018   CO2 29 03/14/2018   TSH 1.71 05/21/2013    Assessment/Plan:  Acute cystitis with hematuria  -UA with 2+ blood, 1+ protein, 1+ leuks, SG 1.015 -Will send for UCx -continue increased hydration -given handout - Plan: cephALEXin (KEFLEX) 500 MG capsule, Urine Culture  F/u prn for worsened or continued symptoms  Grier Mitts, MD

## 2018-08-14 NOTE — Addendum Note (Signed)
Addended by: Wyvonne Lenz on: 08/14/2018 11:29 AM   Modules accepted: Orders

## 2018-08-16 LAB — URINE CULTURE
MICRO NUMBER:: 91262413
SPECIMEN QUALITY:: ADEQUATE

## 2018-09-04 ENCOUNTER — Telehealth: Payer: Self-pay | Admitting: Family Medicine

## 2018-09-04 NOTE — Telephone Encounter (Signed)
Copied from [place] [id number]. Topic: Quick Communication - See Telephone Encounter >> Sep 04, 2018  8:21 AM Robina Ade, Helene Kelp D wrote: CRM for notification. See Telephone encounter for: 09/04/18. Pt called and would like to talk to a CMA about a vaccine that she was suppose to have done this week. Pt stated that she was suppose to get a prolia vaccine. Please call pt back, thanks.

## 2018-09-05 NOTE — Telephone Encounter (Signed)
Pt called to check status Cb#[phone]

## 2018-09-05 NOTE — Telephone Encounter (Signed)
Prolia due on or after 09/15/18

## 2018-09-08 NOTE — Telephone Encounter (Signed)
Appointment made for prolia injection 09/18/18. Patient is aware that her deductible will have to be met and then the cost is $0.

## 2018-09-18 ENCOUNTER — Ambulatory Visit (INDEPENDENT_AMBULATORY_CARE_PROVIDER_SITE_OTHER): Payer: Medicare Other

## 2018-09-18 DIAGNOSIS — M810 Age-related osteoporosis without current pathological fracture: Secondary | ICD-10-CM | POA: Diagnosis not present

## 2018-09-18 DIAGNOSIS — Z23 Encounter for immunization: Secondary | ICD-10-CM | POA: Diagnosis not present

## 2018-09-18 MED ORDER — DENOSUMAB 60 MG/ML ~~LOC~~ SOSY
60.0000 mg | PREFILLED_SYRINGE | Freq: Once | SUBCUTANEOUS | Status: AC
  Administered 2018-09-18: 60 mg via SUBCUTANEOUS

## 2018-09-18 NOTE — Addendum Note (Signed)
Addended by: Gwenyth Ober R on: 09/18/2018 09:39 AM   Modules accepted: Orders

## 2018-09-18 NOTE — Progress Notes (Signed)
Per orders of Dr. Elease Hashimoto, injection of Prolia and influenza given by Franco Collet. Patient tolerated injection well.  Pt stated that she had a reaction last year to the high dose that was administered to her at [facility]. Pt requested low dose. Low dose given.

## 2018-11-07 ENCOUNTER — Telehealth: Payer: Self-pay | Admitting: Family Medicine

## 2018-11-07 NOTE — Telephone Encounter (Signed)
noted 

## 2018-11-07 NOTE — Telephone Encounter (Signed)
Form has been placed on your desk in red folder. Please return when complete. Thank you!

## 2018-11-07 NOTE — Telephone Encounter (Signed)
Letter of Medical necessity to be filled out; placed in dr's folder.  Mail to patient in attached envelope upon completion.

## 2019-01-08 ENCOUNTER — Other Ambulatory Visit: Payer: Self-pay | Admitting: Family Medicine

## 2019-03-13 ENCOUNTER — Other Ambulatory Visit: Payer: Self-pay

## 2019-03-13 ENCOUNTER — Ambulatory Visit (INDEPENDENT_AMBULATORY_CARE_PROVIDER_SITE_OTHER): Payer: Medicare Other | Admitting: Family Medicine

## 2019-03-13 DIAGNOSIS — Z79899 Other long term (current) drug therapy: Secondary | ICD-10-CM | POA: Diagnosis not present

## 2019-03-13 DIAGNOSIS — M818 Other osteoporosis without current pathological fracture: Secondary | ICD-10-CM

## 2019-03-13 DIAGNOSIS — I10 Essential (primary) hypertension: Secondary | ICD-10-CM | POA: Diagnosis not present

## 2019-03-13 NOTE — Progress Notes (Signed)
Patient ID: [name], female   DOB: [birth year], 85 y.o.   MRN: [id number]  This visit type was conducted due to national recommendations for restrictions regarding the COVID-19 pandemic in an effort to limit this patient's exposure and mitigate transmission in our community.   Virtual Visit via Telephone Note  I connected with [name] on 03/13/19 at 10:00 AM EDT by telephone and verified that I am speaking with the correct person using two identifiers.   I discussed the limitations, risks, security and privacy concerns of performing an evaluation and management service by telephone and the availability of in person appointments. I also discussed with the patient that there may be a patient responsible charge related to this service. The patient expressed understanding and agreed to proceed.  Location patient: home Location provider: work or home office Participants present for the call: patient, provider Patient did not have a visit in the prior 7 days to address this/these issue(s).   History of Present Illness: Patient has history of hypertension, irritable bowel syndrome, osteoporosis, hyperlipidemia, osteoarthritis.  We discussed the following issues today  She is due for Prolia injection next Wednesday.  She has not had lab work within the past year.  She has tolerated her injections well in the past.  Denies any recent fall or fracture.  She is taking calcium and vitamin D.  She has history of hypertension.  She is on bisoprolol.  No electrolytes in the past year.  Blood pressure stable.  No headaches or dizziness.  Takes meloxicam and tries to take this infrequently for arthritis.  She has been on 15 mg dose but would like to try lower 7.5 mg dose.   Observations/Objective: Patient sounds cheerful and well on the phone. I do not appreciate any SOB. Speech and thought processing are grossly intact. Patient reported vitals:  Assessment and Plan:   #1  osteoporosis -Patient has Prolia injection scheduled for next Wednesday -Check basic metabolic panel and magnesium level and she will present tomorrow at 10 AM for labs  #2 osteoarthritis multiple joints -Avoid nonsteroidals as much as possible.  We wrote for lower dose Mobic 7.5 mg once daily as needed but try Tylenol as first go to  #3 hypertension which has been stable -Continue bisoprolol and check electrolytes as above  Follow Up Instructions:  -Tomorrow for labs and next Wednesday for Prolia injection. -Routine follow-up in 6 months   99441 5-10 99442 11-20 9443 21-30 I did not refer this patient for an OV in the next 24 hours for this/these issue(s).  I discussed the assessment and treatment plan with the patient. The patient was provided an opportunity to ask questions and all were answered. The patient agreed with the plan and demonstrated an understanding of the instructions.   The patient was advised to call back or seek an in-person evaluation if the symptoms worsen or if the condition fails to improve as anticipated.  I provided 23 minutes of non-face-to-face time during this encounter.   [name], MD

## 2019-03-14 ENCOUNTER — Other Ambulatory Visit: Payer: Self-pay

## 2019-03-14 ENCOUNTER — Other Ambulatory Visit (INDEPENDENT_AMBULATORY_CARE_PROVIDER_SITE_OTHER): Payer: Medicare Other

## 2019-03-14 DIAGNOSIS — Z79899 Other long term (current) drug therapy: Secondary | ICD-10-CM | POA: Diagnosis not present

## 2019-03-14 LAB — MAGNESIUM: Magnesium: 2.1 mg/dL (ref 1.5–2.5)

## 2019-03-14 LAB — BASIC METABOLIC PANEL WITH GFR
BUN: 21 mg/dL (ref 6–23)
CO2: 27 meq/L (ref 19–32)
Calcium: 9 mg/dL (ref 8.4–10.5)
Chloride: 99 meq/L (ref 96–112)
Creatinine, Ser: 0.78 mg/dL (ref 0.40–1.20)
GFR: 70.18 mL/min
Glucose, Bld: 70 mg/dL (ref 70–99)
Potassium: 3.9 meq/L (ref 3.5–5.1)
Sodium: 135 meq/L (ref 135–145)

## 2019-03-14 MED ORDER — CITRACAL PLUS PO TABS: 1.0000 | ORAL_TABLET | Freq: Every day | ORAL | 3 refills | Status: DC

## 2019-03-14 MED ORDER — VITAMIN D (ERGOCALCIFEROL) 1.25 MG (50000 UNIT) PO CAPS: 50000.0000 [IU] | ORAL_CAPSULE | ORAL | 2 refills | Status: DC

## 2019-03-14 MED ORDER — FISH OIL 1000 MG PO CAPS: 2000.0000 mg | ORAL_CAPSULE | Freq: Two times a day (BID) | ORAL | 3 refills | Status: DC

## 2019-03-14 MED ORDER — GLUCOSAMINE-CHONDROITIN 250-200 MG PO TABS: 1.0000 | ORAL_TABLET | Freq: Every day | ORAL | 3 refills | Status: DC

## 2019-03-20 ENCOUNTER — Ambulatory Visit: Payer: Medicare Other

## 2019-03-20 ENCOUNTER — Telehealth: Payer: Self-pay | Admitting: *Deleted

## 2019-03-20 NOTE — Telephone Encounter (Signed)
Copied from [place] [id number]. Topic: General - Other >> Mar 20, 2019  8:58 AM [name] A wrote: Patient wants to know if the shot she is scheduled for tomorrow is covered under the Medicare. Patient would like a callback. >> Mar 20, 2019  9:12 AM Cox, Melburn Hake, CMA wrote: Per LOV notes it looks like this is supposed to be for Prolia. Have you worked on this pt? Thanks!  Waiting on verification of insurance coverage for Prolia.  Injection was rescheduled and patient is aware.

## 2019-03-21 ENCOUNTER — Ambulatory Visit: Payer: Medicare Other

## 2019-03-26 NOTE — Telephone Encounter (Signed)
Patient calling to check the status of her receiving her prolia injection on 03/28/2019. Would like a call back from [place]. CB#: [phone]

## 2019-03-27 NOTE — Telephone Encounter (Signed)
Spoke with husband and prolia will be covered at no cost to the patient.

## 2019-03-28 ENCOUNTER — Other Ambulatory Visit: Payer: Self-pay

## 2019-03-28 ENCOUNTER — Ambulatory Visit (INDEPENDENT_AMBULATORY_CARE_PROVIDER_SITE_OTHER): Payer: Medicare Other

## 2019-03-28 DIAGNOSIS — M818 Other osteoporosis without current pathological fracture: Secondary | ICD-10-CM

## 2019-03-28 MED ORDER — DENOSUMAB 60 MG/ML ~~LOC~~ SOSY
60.0000 mg | PREFILLED_SYRINGE | Freq: Once | SUBCUTANEOUS | Status: AC
  Administered 2019-03-28: 60 mg via SUBCUTANEOUS

## 2019-03-28 NOTE — Progress Notes (Signed)
Per orders of Dr. Elease Hashimoto, injection of Prolia given by Rebecca Eaton. Patient tolerated injection well.

## 2019-04-14 ENCOUNTER — Other Ambulatory Visit: Payer: Self-pay | Admitting: Family Medicine

## 2019-04-16 ENCOUNTER — Other Ambulatory Visit: Payer: Self-pay

## 2019-04-16 MED ORDER — VITAMIN D (ERGOCALCIFEROL) 1.25 MG (50000 UNIT) PO CAPS: 50000.0000 [IU] | ORAL_CAPSULE | ORAL | 2 refills | Status: DC

## 2019-04-16 NOTE — Telephone Encounter (Signed)
Called patient and let her know that I did not realize that the Rx was printed and let her know that I have sent in the refill for her to the pharmacy. Patient verbalized an understanding.

## 2019-04-16 NOTE — Telephone Encounter (Addendum)
Pt would like to know why her vit D was refused. Pt states she thought this lab done 03/14/19, but not done since 03/12/18. Please advise if pt should continue Vit D

## 2019-05-07 ENCOUNTER — Ambulatory Visit (INDEPENDENT_AMBULATORY_CARE_PROVIDER_SITE_OTHER): Payer: Medicare Other | Admitting: Family Medicine

## 2019-05-07 ENCOUNTER — Other Ambulatory Visit: Payer: Self-pay

## 2019-05-07 DIAGNOSIS — K644 Residual hemorrhoidal skin tags: Secondary | ICD-10-CM | POA: Diagnosis not present

## 2019-05-07 DIAGNOSIS — N3000 Acute cystitis without hematuria: Secondary | ICD-10-CM | POA: Diagnosis not present

## 2019-05-07 MED ORDER — HYDROCORT-PRAMOXINE (PERIANAL) 2.5-1 % EX CREA: 1.0000 "application " | TOPICAL_CREAM | Freq: Three times a day (TID) | CUTANEOUS | 1 refills | Status: DC

## 2019-05-07 MED ORDER — CEPHALEXIN 500 MG PO CAPS: 500.0000 mg | ORAL_CAPSULE | Freq: Four times a day (QID) | ORAL | 0 refills | Status: DC

## 2019-05-07 NOTE — Progress Notes (Signed)
Patient ID: [name], female   DOB: 10/28/1931, 85 y.o.   MRN: [id number]  This visit type was conducted due to national recommendations for restrictions regarding the COVID-19 pandemic in an effort to limit this patient's exposure and mitigate transmission in our community.   Virtual Visit via Telephone Note  I connected with [name] on 05/07/19 at  1:15 PM EDT by telephone and verified that I am speaking with the correct person using two identifiers.   I discussed the limitations, risks, security and privacy concerns of performing an evaluation and management service by telephone and the availability of in person appointments. I also discussed with the patient that there may be a patient responsible charge related to this service. The patient expressed understanding and agreed to proceed.  Location patient: home Location provider: work or home office Participants present for the call: patient, provider Patient did not have a visit in the prior 7 days to address this/these issue(s).   History of Present Illness: Patient relates 3 to 4-day history of some urine frequency and burning with urination.  Denies any fevers or chills.  No nausea or vomiting.  No gross hematuria.  Last UTI was 2019 which was E. coli.  No recent hospitalization or instrumentation.  Only antibiotic allergy is Flagyl  Patient also relates recurrent external hemorrhoids.  Mild pain.  No rectal bleeding.  She has had surgery for these in the past.  She is requesting refill of Analpram cream.  She denies any constipation issues.   Observations/Objective: Patient sounds cheerful and well on the phone. I do not appreciate any SOB. Speech and thought processing are grossly intact. Patient reported vitals:  Assessment and Plan:  #1 probable uncomplicated cystitis -Keflex 500 mg 3 times daily for 7 days -Plenty fluids and follow-up if symptoms not fully resolving in next couple days  #2 history  of external hemorrhoids -Refilled Analpram cream to use 2 times daily as needed -Continue measures to reduce constipation with plenty fluids, fiber, stool softeners as needed, and increased ambulation as tolerated  Follow Up Instructions:   99441 5-10 99442 11-20 99443 21-30 I did not refer this patient for an OV in the next 24 hours for this/these issue(s).  I discussed the assessment and treatment plan with the patient. The patient was provided an opportunity to ask questions and all were answered. The patient agreed with the plan and demonstrated an understanding of the instructions.   The patient was advised to call back or seek an in-person evaluation if the symptoms worsen or if the condition fails to improve as anticipated.  I provided 17 minutes of non-face-to-face time during this encounter.   [name], MD

## 2019-05-29 ENCOUNTER — Ambulatory Visit: Payer: Medicare Other

## 2019-05-31 DIAGNOSIS — M25511 Pain in right shoulder: Secondary | ICD-10-CM | POA: Diagnosis not present

## 2019-05-31 DIAGNOSIS — M542 Cervicalgia: Secondary | ICD-10-CM | POA: Diagnosis not present

## 2019-06-01 ENCOUNTER — Other Ambulatory Visit: Payer: Self-pay | Admitting: Family Medicine

## 2019-06-09 DIAGNOSIS — M542 Cervicalgia: Secondary | ICD-10-CM | POA: Diagnosis not present

## 2019-06-18 DIAGNOSIS — M542 Cervicalgia: Secondary | ICD-10-CM | POA: Diagnosis not present

## 2019-07-12 DIAGNOSIS — M542 Cervicalgia: Secondary | ICD-10-CM | POA: Diagnosis not present

## 2019-07-27 ENCOUNTER — Ambulatory Visit (INDEPENDENT_AMBULATORY_CARE_PROVIDER_SITE_OTHER): Payer: Medicare Other

## 2019-07-27 ENCOUNTER — Other Ambulatory Visit: Payer: Self-pay

## 2019-07-27 DIAGNOSIS — Z23 Encounter for immunization: Secondary | ICD-10-CM

## 2019-09-11 ENCOUNTER — Telehealth: Payer: Self-pay

## 2019-09-11 DIAGNOSIS — M818 Other osteoporosis without current pathological fracture: Secondary | ICD-10-CM

## 2019-09-11 NOTE — Telephone Encounter (Signed)
Get BMP one week prior to visit in December for Prolia injection  (dx= "high risk medication use")

## 2019-09-11 NOTE — Telephone Encounter (Signed)
Please advise if you want her to have a wellness visit or CPE?  Copied from [place] [id number]. Topic: General - Other >> Sep 11, 2019  1:33 PM Yvette Rack wrote: Reason for CRM: Pt stated she has not had any blood work done this year and she was also suppose to have an appt to get a prolia shot in December. Pt requests call back

## 2019-09-12 NOTE — Telephone Encounter (Signed)
[name],  Can you check to see when patient is due for Prolia and approved? Also please see note from Dr. Elease Hashimoto as she will need a lab appointment one week prior to Prolia for BMP, Dx: high risk medication use.  Thank you!

## 2019-09-13 DIAGNOSIS — D485 Neoplasm of uncertain behavior of skin: Secondary | ICD-10-CM | POA: Diagnosis not present

## 2019-09-13 DIAGNOSIS — Z23 Encounter for immunization: Secondary | ICD-10-CM | POA: Diagnosis not present

## 2019-09-13 DIAGNOSIS — L570 Actinic keratosis: Secondary | ICD-10-CM | POA: Diagnosis not present

## 2019-09-13 NOTE — Telephone Encounter (Signed)
Patient returned call- she wanted to go ahead and schedule lab/injection. Lab: 11/23 Injection: 12/7 Please let her know if these dates need to be changed.

## 2019-09-13 NOTE — Telephone Encounter (Signed)
Prolia is due now.  Insurance verification started.    Left message on machine for patient to return our call to schedule a lab appointment.  Lab ordered   CRM

## 2019-09-14 ENCOUNTER — Other Ambulatory Visit: Payer: Self-pay

## 2019-09-17 ENCOUNTER — Other Ambulatory Visit: Payer: Self-pay

## 2019-09-17 ENCOUNTER — Other Ambulatory Visit (INDEPENDENT_AMBULATORY_CARE_PROVIDER_SITE_OTHER): Payer: Medicare Other

## 2019-09-17 DIAGNOSIS — M818 Other osteoporosis without current pathological fracture: Secondary | ICD-10-CM

## 2019-09-17 LAB — BASIC METABOLIC PANEL
BUN: 21 mg/dL (ref 6–23)
CO2: 26 mEq/L (ref 19–32)
Calcium: 9.2 mg/dL (ref 8.4–10.5)
Chloride: 101 mEq/L (ref 96–112)
Creatinine, Ser: 0.77 mg/dL (ref 0.40–1.20)
GFR: 71.15 mL/min (ref >60.00)
Glucose, Bld: 88 mg/dL (ref 70–99)
Potassium: 4 mEq/L (ref 3.5–5.1)
Sodium: 137 mEq/L (ref 135–145)

## 2019-09-17 LAB — VITAMIN D 25 HYDROXY (VIT D DEFICIENCY, FRACTURES): VITD: 88.31 ng/mL (ref 30.00–100.00)

## 2019-09-18 ENCOUNTER — Other Ambulatory Visit: Payer: Self-pay

## 2019-10-01 ENCOUNTER — Ambulatory Visit: Payer: Medicare Other

## 2019-10-02 ENCOUNTER — Telehealth: Payer: Self-pay | Admitting: *Deleted

## 2019-10-02 NOTE — Telephone Encounter (Signed)
Copied from [place] [phone]. Topic: Quick Communication - Appointment Cancellation >> Oct 02, 2019  4:21 PM Celene Kras wrote: Patient called to cancel appointment scheduled for 10/03/2019. Patient has not rescheduled their appointment. Pt is concerned about prolia shot not being covered by insurance. Pt is requesting to not have shot scheduled until someone can give her a clear answer about it being covered. Please advise.   Route to department's PEC pool.  Appointment has been cancelled until Apolonio Schneiders returns into office

## 2019-10-02 NOTE — Telephone Encounter (Signed)
Copied from [place] [id number]. Topic: General - Inquiry >> Sep 28, 2019  1:23 PM [name] wrote: Reason for CRM: Patient states her insurance cover her prolia shot on Monday. Patient does want to come in unless her insurance pays Call back [phone] >> Oct 02, 2019  9:12 AM [name], [employer] L wrote: Pt is still wanting to know if her Prolia injection has been approved.  She has an appointment for tomorrow for this but will need to cancel if she doesn't hear back. >> Sep 28, 2019  4:59 PM [name] A wrote: Patient is still awaiting a callback in regards to if her prolia injection has been approved by her insurance.

## 2019-10-03 ENCOUNTER — Ambulatory Visit: Payer: Medicare Other

## 2019-10-03 NOTE — Telephone Encounter (Signed)
Patient called in stating she did get in contact with her insurance and was told that the office would have to call to see if she could get approval for the prolia injection. Please advise. Pt would like a call back to see what the status is.

## 2019-10-03 NOTE — Telephone Encounter (Signed)
See note

## 2019-10-05 NOTE — Telephone Encounter (Signed)
Spoke with patient. She is aware that [place] injection and has been out of the office. [name] will follow up with the patient when she returns to the office.

## 2019-10-05 NOTE — Telephone Encounter (Signed)
See phone encounter from 10/02/2019 for further documentation.

## 2019-10-15 ENCOUNTER — Encounter: Payer: Self-pay | Admitting: Family Medicine

## 2019-10-15 ENCOUNTER — Encounter (HOSPITAL_COMMUNITY): Payer: Self-pay | Admitting: Emergency Medicine

## 2019-10-15 ENCOUNTER — Telehealth (INDEPENDENT_AMBULATORY_CARE_PROVIDER_SITE_OTHER): Payer: Medicare Other | Admitting: Family Medicine

## 2019-10-15 ENCOUNTER — Other Ambulatory Visit: Payer: Self-pay

## 2019-10-15 ENCOUNTER — Ambulatory Visit (HOSPITAL_COMMUNITY)
Admission: EM | Admit: 2019-10-15 | Discharge: 2019-10-15 | Disposition: A | Discharge status: Home / Self Care | Payer: Medicare Other | Attending: Internal Medicine | Admitting: Internal Medicine

## 2019-10-15 DIAGNOSIS — Z20822 Contact with and (suspected) exposure to covid-19: Secondary | ICD-10-CM

## 2019-10-15 DIAGNOSIS — R6889 Other general symptoms and signs: Secondary | ICD-10-CM

## 2019-10-15 DIAGNOSIS — B349 Viral infection, unspecified: Secondary | ICD-10-CM | POA: Diagnosis present

## 2019-10-15 DIAGNOSIS — Z20828 Contact with and (suspected) exposure to other viral communicable diseases: Secondary | ICD-10-CM

## 2019-10-15 DIAGNOSIS — I10 Essential (primary) hypertension: Secondary | ICD-10-CM

## 2019-10-15 DIAGNOSIS — U071 COVID-19: Secondary | ICD-10-CM | POA: Insufficient documentation

## 2019-10-15 MED ORDER — BENZONATATE 100 MG PO CAPS: 100.0000 mg | ORAL_CAPSULE | Freq: Three times a day (TID) | ORAL | 0 refills | Status: DC

## 2019-10-15 MED ORDER — ONDANSETRON HCL 4 MG PO TABS: 4.0000 mg | ORAL_TABLET | Freq: Four times a day (QID) | ORAL | 0 refills | Status: DC

## 2019-10-15 NOTE — Progress Notes (Signed)
Virtual Visit via Telephone Note  I connected with the patient on 10/15/19 at 11:00 AM EST by telephone and verified that I am speaking with the correct person using two identifiers.We attempted to connect virtually but we had technical difficulties with the audio and video.    I discussed the limitations, risks, security and privacy concerns of performing an evaluation and management service by telephone and the availability of in person appointments. I also discussed with the patient that there may be a patient responsible charge related to this service. The patient expressed understanding and agreed to proceed.  Location patient: home Location provider: work or home office Participants present for the call: patient, provider Patient did not have a visit in the prior 7 days to address this/these issue(s).   History of Present Illness: I spoke to the patient and her daughter, Nicholes Mango, about her symptoms that began 5 days ago. These include weakness, PND, chest pain (though no cough or SOB), nausea without vomiting, decreased appetite, and numbness in the feet. No fever or headache, no bodyaches, no  Loss of taste or smell. She has had some mild abdominal pain but no diarrhea. Her husband has also had some similar symptoms.    Observations/Objective: Patient sounds cheerful and well on the phone. I do not appreciate any SOB. Speech and thought processing are grossly intact. Patient reported vitals:  Assessment and Plan: I am concerned she may have a Covid-19 infection and or pneumonia, so I suggested her daughter drive her to the [facility] clinic, and they both agreed. She will need an exam, a Covid test, and likely a CXR. I also suggested her husband and her daughter be tested for the Covid virus as well.  Alysia Penna, MD   Follow Up Instructions:     [phone] 5-10 [phone] 11-20 [phone] 21-30 I did not refer this patient for an OV in the next 24 hours for this/these  issue(s).  I discussed the assessment and treatment plan with the patient. The patient was provided an opportunity to ask questions and all were answered. The patient agreed with the plan and demonstrated an understanding of the instructions.   The patient was advised to call back or seek an in-person evaluation if the symptoms worsen or if the condition fails to improve as anticipated.  I provided 14 minutes of non-face-to-face time during this encounter.   Alysia Penna, MD
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
## 2019-10-15 NOTE — ED Triage Notes (Signed)
Sinus drainage in throat, patient touches mid epigastric pain as location of pain and intermittent generalized abdominal pain.    Symptoms started 4 days ago, tired

## 2019-10-15 NOTE — Discharge Instructions (Signed)
Patient is advised to self isolate until COVID-19 test results are available.

## 2019-10-16 ENCOUNTER — Telehealth: Payer: Self-pay | Admitting: *Deleted

## 2019-10-16 NOTE — Telephone Encounter (Signed)
Attempted to call patient.  Prolia injection ready for patient to schedule.  CRM

## 2019-10-17 ENCOUNTER — Other Ambulatory Visit: Payer: Self-pay | Admitting: Unknown Physician Specialty

## 2019-10-17 ENCOUNTER — Telehealth: Payer: Self-pay | Admitting: Emergency Medicine

## 2019-10-17 ENCOUNTER — Other Ambulatory Visit: Payer: Self-pay | Admitting: Infectious Diseases

## 2019-10-17 ENCOUNTER — Telehealth: Payer: Self-pay | Admitting: Unknown Physician Specialty

## 2019-10-17 DIAGNOSIS — U071 COVID-19: Secondary | ICD-10-CM

## 2019-10-17 LAB — NOVEL CORONAVIRUS, NAA (HOSP ORDER, SEND-OUT TO REF LAB; TAT 18-24 HRS): SARS-CoV-2, NAA: DETECTED — AB

## 2019-10-17 NOTE — Telephone Encounter (Signed)
Your test for COVID-19 was positive, meaning that you were infected with the novel coronavirus and could give the germ to others.  Please continue isolation at home for at least 10 days since the start of your symptoms. If you do not have symptoms, please isolate at home for 10 days from the day you were tested. Once you complete your 10 day quarantine, you may return to normal activities as long as you've not had a fever for over 24 hours(without taking fever reducing medicine) and your symptoms are improving. Please continue good preventive care measures, including:  frequent hand-washing, avoid touching your face, cover coughs/sneezes, stay out of crowds and keep a 6 foot distance from others.  Go to the nearest hospital emergency room if fever/cough/breathlessness are severe or illness seems like a threat to life.   Pt was notified earlier today by family medicine. Attempted to reach her to see if she had any more questions, phone just rang continuously.

## 2019-10-17 NOTE — Telephone Encounter (Signed)
  I connected by phone with [name] on 10/17/2019 at 3:53 PM to discuss the potential use of an new treatment for mild to moderate COVID-19 viral infection in non-hospitalized patients.  This patient is a 85 y.o. female that meets the FDA criteria for Emergency Use Authorization of bamlanivimab or casirivimab\imdevimab.  Has a (+) direct SARS-CoV-2 viral test result  Has mild or moderate COVID-19   Is ? 85 years of age and weighs ? 40 kg  Is NOT hospitalized due to COVID-19  Is NOT requiring oxygen therapy or requiring an increase in baseline oxygen flow rate due to COVID-19  Is within 10 days of symptom onset  Has at least one of the high risk factor(s) for progression to severe COVID-19 and/or hospitalization as defined in EUA.  Specific high risk criteria : >/= 83 yo   I have spoken and communicated the following to the patient or parent/caregiver:  1. FDA has authorized the emergency use of bamlanivimab and casirivimab\imdevimab for the treatment of mild to moderate COVID-19 in adults and pediatric patients with positive results of direct SARS-CoV-2 viral testing who are 45 years of age and older weighing at least 40 kg, and who are at high risk for progressing to severe COVID-19 and/or hospitalization.  2. The significant known and potential risks and benefits of bamlanivimab and casirivimab\imdevimab, and the extent to which such potential risks and benefits are unknown.  3. Information on available alternative treatments and the risks and benefits of those alternatives, including clinical trials.  4. Patients treated with bamlanivimab and casirivimab\imdevimab should continue to self-isolate and use infection control measures (e.g., wear mask, isolate, social distance, avoid sharing personal items, clean and disinfect "high touch" surfaces, and frequent handwashing) according to CDC guidelines.   5. The patient or parent/caregiver has the option to accept or refuse  bamlanivimab or casirivimab\imdevimab .  After reviewing this information with the patient, The patient agreed to proceed with receiving the bamlanimivab infusion and will be provided a copy of the Fact sheet prior to receiving the infusion.[name] 10/17/2019 3:53 PM

## 2019-10-20 ENCOUNTER — Telehealth (HOSPITAL_COMMUNITY): Payer: Self-pay | Admitting: Emergency Medicine

## 2019-10-20 NOTE — Telephone Encounter (Signed)
Late entry 10:28 am.  Patient's daughter called Neoma Laming [phone] )  Daughter says mother is doing worse.  Mother is tired, too tired to get dressed.  Spoke to [employer], [place].  Recommended patient go to ED.  Notified patient's daughter of this recommendation.

## 2019-10-22 ENCOUNTER — Ambulatory Visit (HOSPITAL_COMMUNITY): Payer: Medicare Other

## 2019-10-22 ENCOUNTER — Ambulatory Visit (HOSPITAL_COMMUNITY)
Admission: RE | Admit: 2019-10-22 | Discharge: 2019-10-22 | Disposition: A | Discharge status: Home / Self Care | Payer: Medicare Other | Source: Ambulatory Visit | Attending: Pulmonary Disease | Admitting: Pulmonary Disease

## 2019-10-22 DIAGNOSIS — Z23 Encounter for immunization: Secondary | ICD-10-CM | POA: Insufficient documentation

## 2019-10-22 DIAGNOSIS — U071 COVID-19: Secondary | ICD-10-CM | POA: Diagnosis not present

## 2019-10-22 MED ORDER — SODIUM CHLORIDE 0.9 % IV SOLN: INTRAVENOUS | Status: DC | PRN

## 2019-10-22 MED ORDER — ALBUTEROL SULFATE HFA 108 (90 BASE) MCG/ACT IN AERS: 2.0000 | INHALATION_SPRAY | Freq: Once | RESPIRATORY_TRACT | Status: DC | PRN

## 2019-10-22 MED ORDER — METHYLPREDNISOLONE SODIUM SUCC 125 MG IJ SOLR: 125.0000 mg | Freq: Once | INTRAMUSCULAR | Status: DC | PRN

## 2019-10-22 MED ORDER — SODIUM CHLORIDE 0.9 % IV SOLN
700.0000 mg | Freq: Once | INTRAVENOUS | Status: AC
  Administered 2019-10-22: 700 mg via INTRAVENOUS
  Filled 2019-10-22: qty 20

## 2019-10-22 MED ORDER — DIPHENHYDRAMINE HCL 50 MG/ML IJ SOLN: 50.0000 mg | Freq: Once | INTRAMUSCULAR | Status: DC | PRN

## 2019-10-22 MED ORDER — FAMOTIDINE IN NACL 20-0.9 MG/50ML-% IV SOLN: 20.0000 mg | Freq: Once | INTRAVENOUS | Status: DC | PRN

## 2019-10-22 MED ORDER — EPINEPHRINE 0.3 MG/0.3ML IJ SOAJ: 0.3000 mg | Freq: Once | INTRAMUSCULAR | Status: DC | PRN

## 2019-10-22 NOTE — Progress Notes (Signed)
  Diagnosis: COVID-19  Physician: Dr. Wright  Procedure: [facility] Infusion Clinic Med: bamlanivimab infusion - Provided patient with bamlanimivab fact sheet for patients, parents and caregivers prior to infusion.  Complications: No immediate complications noted.  Discharge: Discharged home   [name] 10/22/2019  

## 2019-10-22 NOTE — Discharge Instructions (Signed)
Prevent the Spread of COVID-19 if You Are Sick If you are sick with COVID-19 or think you might have COVID-19, follow the steps below to help protect other people in your home and community. Stay home except to get medical care.  Stay home. Most people with COVID-19 have mild illness and are able to recover at home without medical care. Do not leave your home, except to get medical care. Do not visit public areas.  Take care of yourself. Get rest and stay hydrated.  Get medical care when needed. Call your doctor before you go to their office for care. But, if you have trouble breathing or other concerning symptoms, call 911 for immediate help.  Avoid public transportation, ride-sharing, or taxis. Separate yourself from other people and pets in your home.  As much as possible, stay in a specific room and away from other people and pets in your home. Also, you should use a separate bathroom, if available. If you need to be around other people or animals in or outside of the home, wear a cloth face covering. ? See COVID-19 and Animals if you have questions about pets: https://www.cdc.gov/coronavirus/2019-ncov/faq.html#COVID19animals Monitor your symptoms.  Common symptoms of COVID-19 include fever and cough. Trouble breathing is a more serious symptom that means you should get medical attention.  Follow care instructions from your healthcare provider and local health department. Your local health authorities will give instructions on checking your symptoms and reporting information. If you develop emergency warning signs for COVID-19 get medical attention immediately.  Emergency warning signs include*:  Trouble breathing  Persistent pain or pressure in the chest  New confusion or not able to be woken  Bluish lips or face *This list is not all inclusive. Please consult your medical provider for any other symptoms that are severe or concerning to you. Call 911 if you have a medical  emergency. If you have a medical emergency and need to call 911, notify the operator that you have or think you might have, COVID-19. If possible, put on a facemask before medical help arrives. Call ahead before visiting your doctor.  Call ahead. Many medical visits for routine care are being postponed or done by phone or telemedicine.  If you have a medical appointment that cannot be postponed, call your doctor's office. This will help the office protect themselves and other patients. If you are sick, wear a cloth covering over your nose and mouth.  You should wear a cloth face covering over your nose and mouth if you must be around other people or animals, including pets (even at home).  You don't need to wear the cloth face covering if you are alone. If you can't put on a cloth face covering (because of trouble breathing for example), cover your coughs and sneezes in some other way. Try to stay at least 6 feet away from other people. This will help protect the people around you. Note: During the COVID-19 pandemic, medical grade facemasks are reserved for healthcare workers and some first responders. You may need to make a cloth face covering using a scarf or bandana. Cover your coughs and sneezes.  Cover your mouth and nose with a tissue when you cough or sneeze.  Throw used tissues in a lined trash can.  Immediately wash your hands with soap and water for at least 20 seconds. If soap and water are not available, clean your hands with an alcohol-based hand sanitizer that contains at least 60% alcohol. Clean your hands often.    Wash your hands often with soap and water for at least 20 seconds. This is especially important after blowing your nose, coughing, or sneezing; going to the bathroom; and before eating or preparing food.  Use hand sanitizer if soap and water are not available. Use an alcohol-based hand sanitizer with at least 60% alcohol, covering all surfaces of your hands and rubbing  them together until they feel dry.  Soap and water are the best option, especially if your hands are visibly dirty.  Avoid touching your eyes, nose, and mouth with unwashed hands. Avoid sharing personal household items.  Do not share dishes, drinking glasses, cups, eating utensils, towels, or bedding with other people in your home.  Wash these items thoroughly after using them with soap and water or put them in the dishwasher. Clean all "high-touch" surfaces everyday.  Clean and disinfect high-touch surfaces in your "sick room" and bathroom. Let someone else clean and disinfect surfaces in common areas, but not your bedroom and bathroom.  If a caregiver or other person needs to clean and disinfect a sick person's bedroom or bathroom, they should do so on an as-needed basis. The caregiver/other person should wear a mask and wait as long as possible after the sick person has used the bathroom. High-touch surfaces include phones, remote controls, counters, tabletops, doorknobs, bathroom fixtures, toilets, keyboards, tablets, and bedside tables.  Clean and disinfect areas that may have blood, stool, or body fluids on them.  Use household cleaners and disinfectants. Clean the area or item with soap and water or another detergent if it is dirty. Then use a household disinfectant. ? Be sure to follow the instructions on the label to ensure safe and effective use of the product. Many products recommend keeping the surface wet for several minutes to ensure germs are killed. Many also recommend precautions such as wearing gloves and making sure you have good ventilation during use of the product. ? Most EPA-registered household disinfectants should be effective. How to discontinue home isolation  People with COVID-19 who have stayed home (home isolated) can stop home isolation under the following conditions: ? If you will not have a test to determine if you are still contagious, you can leave home  after these three things have happened:  You have had no fever for at least 72 hours (that is three full days of no fever without the use of medicine that reduces fevers) AND  other symptoms have improved (for example, when your cough or shortness of breath has improved) AND  at least 10 days have passed since your symptoms first appeared. ? If you will be tested to determine if you are still contagious, you can leave home after these three things have happened:  You no longer have a fever (without the use of medicine that reduces fevers) AND  other symptoms have improved (for example, when your cough or shortness of breath has improved) AND  you received two negative tests in a row, 24 hours apart. Your doctor will follow CDC guidelines. In all cases, follow the guidance of your healthcare provider and local health department. The decision to stop home isolation should be made in consultation with your healthcare provider and state and local health departments. Local decisions depend on local circumstances. cdc.gov/coronavirus 02/25/2019 This information is not intended to replace advice given to you by your health care provider. Make sure you discuss any questions you have with your health care provider. Document Released: 02/06/2019 Document Revised: 03/07/2019 Document Reviewed: 02/06/2019   Elsevier Patient Education  2020 Elsevier Inc.  

## 2019-10-23 ENCOUNTER — Telehealth: Payer: Self-pay | Admitting: *Deleted

## 2019-10-23 NOTE — Telephone Encounter (Signed)
Spoke with patient and she will call back in at least 2 weeks and when she is feeling better.

## 2019-10-23 NOTE — Telephone Encounter (Signed)
Left message on machine for patient.  Prolia has been approved and ready for scheduling.

## 2019-10-23 NOTE — Telephone Encounter (Signed)
Copied from [place] [id number]. Topic: General - Other >> Oct 23, 2019  2:10 PM Leward Quan A wrote: Reason for CRM: Patient called to inform Apolonio Schneiders that she just had an infusion from the covid and would like a call back to discuss what time frame does she have to get the Prolia injection. Please call patient at Ph# [phone]

## 2019-10-24 NOTE — Telephone Encounter (Signed)
Spoke with patient and she is waiting for her bone density test before her Prolia injection.

## 2019-10-25 ENCOUNTER — Other Ambulatory Visit: Payer: Self-pay | Admitting: *Deleted

## 2019-10-25 ENCOUNTER — Other Ambulatory Visit: Payer: Self-pay | Admitting: Family Medicine

## 2019-10-25 ENCOUNTER — Telehealth: Payer: Self-pay | Admitting: Family Medicine

## 2019-10-25 MED ORDER — ONDANSETRON HCL 4 MG PO TABS: 4.0000 mg | ORAL_TABLET | Freq: Three times a day (TID) | ORAL | 0 refills | Status: DC | PRN

## 2019-10-25 NOTE — Telephone Encounter (Signed)
Refill OK.  Zofran ODT 4 mg one by mouth every 8 hours as needed for nausea and vomiting

## 2019-10-25 NOTE — Telephone Encounter (Signed)
Please advise 

## 2019-10-25 NOTE — Telephone Encounter (Signed)
Rx sent to pharmacy   

## 2019-10-25 NOTE — Telephone Encounter (Signed)
Medication: ondansetron (ZOFRAN) 4 MG tablet [id number]  Prescription originally from Dr. Lanny Cramp for nasau due to covid. After speaking with the infusion clinic Amy, advised to send to pcp. If PCP unable to fill direct patient back to the infusion clinic attention Richardson Landry. [phone]  Has the patient contacted their pharmacy? Yes  (Agent: If no, request that the patient contact the pharmacy for the refill.) (Agent: If yes, when and what did the pharmacy advise?)  Preferred Pharmacy (with phone number or street name): [id number] - [place], [place]. [address]  Phone:  [phone] Fax:  [phone]     Agent: Please be advised that RX refills may take up to 3 business days. We ask that you follow-up with your pharmacy.

## 2019-10-30 ENCOUNTER — Telehealth: Payer: Self-pay | Admitting: *Deleted

## 2019-10-30 NOTE — Telephone Encounter (Signed)
Copied from [place] [id number]. Topic: General - Call Back - No Documentation >> Oct 30, 2019 10:51 AM Erick Blinks wrote: Reason for CRM: Pt called and is requesting a call back to discuss whether she should receive the Covid 19 vaccine today, tried calling office. Please advise Best contact: [phone]

## 2019-10-30 NOTE — Telephone Encounter (Signed)
Patient requesting call back regarding. Please contact patient.

## 2019-10-31 ENCOUNTER — Telehealth: Payer: Self-pay | Admitting: Family Medicine

## 2019-10-31 NOTE — Telephone Encounter (Signed)
yes

## 2019-10-31 NOTE — Telephone Encounter (Signed)
Patient wants me to ask you if you recommend that she receive the covid vaccine?

## 2019-10-31 NOTE — Telephone Encounter (Signed)
Pt had the infusion for the COVID on 10/22/2019 at [facility] and she has had COVID on 10/15/2019.  Pt is asking is it okay for her to get the COVID vaccine?

## 2019-10-31 NOTE — Telephone Encounter (Signed)
Please see message. Called patient and she wants to know when is it okay to take the Covid vaccine. She is still currently having symptoms and recovering from Covid. Please advise.  Also she wants to know if it is okay for her to get her Prolia shot also.

## 2019-11-01 NOTE — Telephone Encounter (Signed)
Based on what I have read, as long as she is asymptomatic and past the 10 day quarantine she can go ahead and get the Covid vaccine.   She is also OK to get her next Prolia injection.

## 2019-11-02 NOTE — Telephone Encounter (Signed)
Called patient and gave her this message. Patient verbalized an understanding.

## 2019-11-02 NOTE — Telephone Encounter (Signed)
I just saw that she had a monoclonal antibody infusion. She will need to wait 90 days from infusion for the vaccine.

## 2019-11-28 ENCOUNTER — Other Ambulatory Visit: Payer: Self-pay

## 2019-11-28 MED ORDER — VITAMIN D (ERGOCALCIFEROL) 1.25 MG (50000 UNIT) PO CAPS: 50000.0000 [IU] | ORAL_CAPSULE | ORAL | 2 refills | Status: DC

## 2019-11-28 MED ORDER — FISH OIL 1000 MG PO CAPS: 2000.0000 mg | ORAL_CAPSULE | Freq: Two times a day (BID) | ORAL | 3 refills | Status: DC

## 2019-11-28 MED ORDER — CITRACAL PLUS PO TABS: 1.0000 | ORAL_TABLET | Freq: Every day | ORAL | 3 refills | Status: DC

## 2019-11-28 MED ORDER — GLUCOSAMINE-CHONDROITIN 250-200 MG PO TABS: 1.0000 | ORAL_TABLET | Freq: Every day | ORAL | 3 refills | Status: DC

## 2019-12-05 NOTE — Telephone Encounter (Signed)
prolia is approved.  Appointment scheduled.

## 2019-12-06 ENCOUNTER — Ambulatory Visit (INDEPENDENT_AMBULATORY_CARE_PROVIDER_SITE_OTHER): Payer: Medicare Other | Admitting: *Deleted

## 2019-12-06 ENCOUNTER — Other Ambulatory Visit: Payer: Self-pay

## 2019-12-06 DIAGNOSIS — M818 Other osteoporosis without current pathological fracture: Secondary | ICD-10-CM | POA: Diagnosis not present

## 2019-12-06 MED ORDER — DENOSUMAB 60 MG/ML ~~LOC~~ SOSY
60.0000 mg | PREFILLED_SYRINGE | Freq: Once | SUBCUTANEOUS | Status: AC
  Administered 2019-12-06: 60 mg via SUBCUTANEOUS

## 2019-12-06 NOTE — Progress Notes (Signed)
Per orders of Dr. Hernandez, injection of Prolia given by Tondalaya Perren. Patient tolerated injection well. 

## 2020-01-21 DIAGNOSIS — H2513 Age-related nuclear cataract, bilateral: Secondary | ICD-10-CM | POA: Diagnosis not present

## 2020-02-04 ENCOUNTER — Ambulatory Visit: Payer: Medicare Other | Attending: Internal Medicine

## 2020-02-04 DIAGNOSIS — Z23 Encounter for immunization: Secondary | ICD-10-CM

## 2020-02-04 NOTE — Progress Notes (Signed)
   Covid-19 Vaccination Clinic  Name:  [name]    MRN: [id number] DOB: [birth year]  02/04/2020  [name] was observed post Covid-19 immunization for 15 minutes without incident. She was provided with Vaccine Information Sheet and instruction to access the V-Safe system.   [name] was instructed to call 911 with any severe reactions post vaccine: Marland Kitchen Difficulty breathing  . Swelling of face and throat  . A fast heartbeat  . A bad rash all over body  . Dizziness and weakness   Immunizations Administered    Name Date Dose VIS Date Route   Pfizer COVID-19 Vaccine 02/04/2020 10:54 AM 0.3 mL 10/05/2019 Intramuscular   Manufacturer: [place]   Lot: [id number]   [place]: [id number]

## 2020-02-05 ENCOUNTER — Other Ambulatory Visit: Payer: Self-pay | Admitting: Family Medicine

## 2020-02-13 DIAGNOSIS — D225 Melanocytic nevi of trunk: Secondary | ICD-10-CM | POA: Diagnosis not present

## 2020-02-13 DIAGNOSIS — D1801 Hemangioma of skin and subcutaneous tissue: Secondary | ICD-10-CM | POA: Diagnosis not present

## 2020-02-13 DIAGNOSIS — L821 Other seborrheic keratosis: Secondary | ICD-10-CM | POA: Diagnosis not present

## 2020-02-13 DIAGNOSIS — C4490 Unspecified malignant neoplasm of skin, unspecified: Secondary | ICD-10-CM | POA: Diagnosis not present

## 2020-02-13 DIAGNOSIS — D485 Neoplasm of uncertain behavior of skin: Secondary | ICD-10-CM | POA: Diagnosis not present

## 2020-02-13 DIAGNOSIS — L570 Actinic keratosis: Secondary | ICD-10-CM | POA: Diagnosis not present

## 2020-02-13 DIAGNOSIS — B079 Viral wart, unspecified: Secondary | ICD-10-CM | POA: Diagnosis not present

## 2020-02-13 DIAGNOSIS — L578 Other skin changes due to chronic exposure to nonionizing radiation: Secondary | ICD-10-CM | POA: Diagnosis not present

## 2020-02-27 ENCOUNTER — Ambulatory Visit: Payer: Medicare Other | Attending: Internal Medicine

## 2020-02-27 DIAGNOSIS — Z23 Encounter for immunization: Secondary | ICD-10-CM

## 2020-02-27 NOTE — Progress Notes (Signed)
   Covid-19 Vaccination Clinic  Name:  [name]    MRN: [id number] DOB: 1931/11/30  02/27/2020  [name] was observed post Covid-19 immunization for 15 minutes without incident. She was provided with Vaccine Information Sheet and instruction to access the V-Safe system.   [name] was instructed to call 911 with any severe reactions post vaccine: Marland Kitchen Difficulty breathing  . Swelling of face and throat  . A fast heartbeat  . A bad rash all over body  . Dizziness and weakness   Immunizations Administered    Name Date Dose VIS Date Route   Pfizer COVID-19 Vaccine 02/27/2020  8:09 AM 0.3 mL 12/19/2018 Intramuscular   Manufacturer: [place]   Lot: [id number]   [place]: [id number]

## 2020-05-01 ENCOUNTER — Telehealth: Payer: Self-pay | Admitting: Family Medicine

## 2020-05-01 ENCOUNTER — Other Ambulatory Visit: Payer: Self-pay | Admitting: Family Medicine

## 2020-05-01 NOTE — Telephone Encounter (Signed)
Attempted to call the patient.  Her Prolia injection can be scheduled after 06/05/20.

## 2020-05-01 NOTE — Telephone Encounter (Signed)
Pt is calling in wanting to know when she will be having her next Prolia inj.  Pt would like to have a call once it has been approved.

## 2020-05-12 NOTE — Telephone Encounter (Signed)
Pt is calling to schedule her Prolia injection. Pt says, she is sorry for not getting your call on 05/01/20. Pt can be reached at [phone]. Thanks

## 2020-05-15 NOTE — Telephone Encounter (Signed)
Left message on machine for patient to schedule Prolia.  Okay to schedule after 06/05/20.

## 2020-05-16 NOTE — Telephone Encounter (Signed)
Patient scheduled.

## 2020-05-29 ENCOUNTER — Telehealth: Payer: Self-pay | Admitting: *Deleted

## 2020-05-29 NOTE — Telephone Encounter (Signed)
Left message on machine for patient to schedule a Prolia injection.  Can be scheduled after 06/05/20.

## 2020-06-10 ENCOUNTER — Other Ambulatory Visit: Payer: Self-pay

## 2020-06-10 ENCOUNTER — Ambulatory Visit (INDEPENDENT_AMBULATORY_CARE_PROVIDER_SITE_OTHER): Payer: Medicare Other | Admitting: *Deleted

## 2020-06-10 DIAGNOSIS — M818 Other osteoporosis without current pathological fracture: Secondary | ICD-10-CM | POA: Diagnosis not present

## 2020-06-10 MED ORDER — DENOSUMAB 60 MG/ML ~~LOC~~ SOSY
60.0000 mg | PREFILLED_SYRINGE | Freq: Once | SUBCUTANEOUS | Status: AC
  Administered 2020-06-10: 60 mg via SUBCUTANEOUS

## 2020-06-10 NOTE — Progress Notes (Signed)
Patient in office for Prolia injection. Injection administered with no immediate reaction.

## 2020-07-23 ENCOUNTER — Other Ambulatory Visit: Payer: Self-pay | Admitting: Family Medicine

## 2020-08-05 ENCOUNTER — Other Ambulatory Visit: Payer: Self-pay

## 2020-08-05 ENCOUNTER — Encounter: Payer: Self-pay | Admitting: Family Medicine

## 2020-08-05 ENCOUNTER — Ambulatory Visit (INDEPENDENT_AMBULATORY_CARE_PROVIDER_SITE_OTHER): Payer: Medicare Other | Admitting: Family Medicine

## 2020-08-05 VITALS — BP 116/80 | HR 84 | Temp 97.6°F | Ht 65.5 in | Wt 118.0 lb

## 2020-08-05 DIAGNOSIS — R300 Dysuria: Secondary | ICD-10-CM | POA: Diagnosis not present

## 2020-08-05 DIAGNOSIS — R252 Cramp and spasm: Secondary | ICD-10-CM | POA: Diagnosis not present

## 2020-08-05 DIAGNOSIS — Z23 Encounter for immunization: Secondary | ICD-10-CM | POA: Diagnosis not present

## 2020-08-05 LAB — POCT URINALYSIS DIP (MANUAL ENTRY)
Bilirubin, UA: NEGATIVE
Blood, UA: NEGATIVE
Glucose, UA: NEGATIVE mg/dL
Ketones, POC UA: NEGATIVE mg/dL
Nitrite, UA: NEGATIVE
Protein Ur, POC: NEGATIVE mg/dL
Spec Grav, UA: 1.01 (ref 1.010–1.025)
Urobilinogen, UA: 0.2 E.U./dL
pH, UA: 5 (ref 5.0–8.0)

## 2020-08-05 MED ORDER — CEPHALEXIN 500 MG PO CAPS: 500.0000 mg | ORAL_CAPSULE | Freq: Three times a day (TID) | ORAL | 0 refills | Status: DC

## 2020-08-05 NOTE — Progress Notes (Signed)
Established Patient Office Visit  Subjective:  Patient ID: [name], female    DOB: August 14, 1932  Age: 86 y.o. MRN: [id number]  CC:  Chief Complaint  Patient presents with  . Dysuria    onset this past Friday, took cranberry pills. This morning very painful in vsginsl area  . Immunizations    want reg flu shot this yr, high dose causes problems    HPI [name] presents for symptoms of urine frequency and burning with urination since this past Friday.  She took some cranberry pills but has not seen much benefit.  She has had occasional mild left lower lumbar back pain.  No nausea or vomiting.  She had UTI with E. coli 2019 but no infection since then.  No gross hematuria.    She also complains of frequent leg cramps at night.  She realizes she may not drink enough fluids.  She does take bisoprolol HCTZ for hypertension.  Last electrolytes were about a year ago.  Symptoms are not nightly.  Past Medical History:  Diagnosis Date  . Constipation   . Diverticulosis   . Hypertension   . Inguinal hernia, left   . Irritable bowel syndrome   . Osteoporosis   . UTI (lower urinary tract infection)    hx of frequent uti's    Past Surgical History:  Procedure Laterality Date  . APPENDECTOMY    . BREAST SURGERY Left    biopsy  . CERVICAL FUSION    . colon polyps    . HEMORRHOID SURGERY    . HIP SURGERY Right   . INGUINAL HERNIA REPAIR Left 01/13/2017   Procedure: LEFT INGUINAL HERNIA REPAIR WITH MESH;  Surgeon: Jackolyn Confer, MD;  Location: [facility];  Service: General;  Laterality: Left;  . INSERTION OF MESH Left 01/13/2017   Procedure: INSERTION OF MESH;  Surgeon: Jackolyn Confer, MD;  Location: [facility];  Service: General;  Laterality: Left;  . LUMBAR FUSION    . LUMBAR LAMINECTOMY    . partial resection of colon for tumor    . SHOULDER SURGERY Left   . TOTAL HIP ARTHROPLASTY     right    Family History  Problem  Relation Age of Onset  . Colon cancer Mother   . Cancer Father   . Lung cancer Father   . Ovarian cancer Sister   . Breast cancer Sister   . Liver cancer Brother   . Heart disease Brother     Social History   Socioeconomic History  . Marital status: Married    Spouse name: Not on file  . Number of children: Not on file  . Years of education: Not on file  . Highest education level: Not on file  Occupational History  . Occupation: retired    Fish farm manager: RETIRED  Tobacco Use  . Smoking status: Never Smoker  . Smokeless tobacco: Current User  Substance and Sexual Activity  . Alcohol use: No  . Drug use: No  . Sexual activity: Not Currently  Other Topics Concern  . 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 Difficulty of Paying Living Expenses: Not on file  Food Insecurity:   . Worried About Charity fundraiser in the Last  Year: Not on file  . Ran Out of Food in the Last Year: Not on file  Transportation Needs:   . Lack of Transportation (Medical): Not on file  . Lack of Transportation (Non-Medical): Not on file  Physical Activity:   . Days of Exercise per Week: Not on file  . Minutes of Exercise per Session: Not on file  Stress:   . Feeling of Stress : Not on file  Social Connections:   . Frequency of Communication with Friends and Family: Not on file  . Frequency of Social Gatherings with Friends and Family: Not on file  . Attends Religious Services: Not on file  . Active Member of Clubs or Organizations: Not on file  . Attends Archivist Meetings: Not on file  . Marital Status: Not on file  Intimate Partner Violence:   . Fear of Current or Ex-Partner: Not on file  . Emotionally Abused: Not on file  . Physically Abused: Not on file  . Sexually Abused: Not on file    Outpatient Medications  Prior to Visit  Medication Sig Dispense Refill  . b complex vitamins tablet Take 1 tablet by mouth daily.      . benzonatate (TESSALON) 100 MG capsule Take 1 capsule (100 mg total) by mouth every 8 (eight) hours. 21 capsule 0  . bisoprolol-hydrochlorothiazide (ZIAC) 5-6.25 MG tablet TAKE 1 TABLET BY MOUTH DAILY. NEEDS APPT FOR FURTHER REFILLS 30 tablet 0  . cephALEXin (KEFLEX) 500 MG capsule Take 1 capsule (500 mg total) by mouth 4 (four) times daily. 21 capsule 0  . Glucosamine-Chondroitin 250-200 MG TABS Take 1 tablet by mouth daily. 90 tablet 3  . hydrocortisone-pramoxine (ANALPRAM HC) 2.5-1 % rectal cream Place 1 application rectally 3 (three) times daily. 30 g 1  . meloxicam (MOBIC) 7.5 MG tablet Take 7.5 mg by mouth daily.    . Multiple Minerals-Vitamins (CITRACAL PLUS) TABS Take 1 tablet by mouth daily. 90 tablet 3  . Omega-3 Fatty Acids (FISH OIL) 1000 MG CAPS Take 2 capsules (2,000 mg total) by mouth 2 (two) times daily. 120 capsule 3  . ondansetron (ZOFRAN) 4 MG tablet Take 1 tablet (4 mg total) by mouth every 8 (eight) hours as needed for nausea or vomiting. 12 tablet 0  . Vitamin D, Ergocalciferol, (DRISDOL) 1.25 MG (50000 UNIT) CAPS capsule TAKE 1 CAPSULE (50,000 UNITS TOTAL) BY MOUTH EVERY 7 (SEVEN) DAYS. 12 capsule 2   No facility-administered medications prior to visit.    Allergies  Allergen Reactions  . Codeine     REACTION: headaches  . Flagyl [Metronidazole Hcl]     nausea    ROS Review of Systems  Constitutional: Negative for appetite change, chills, fever and unexpected weight change.  Gastrointestinal: Negative for abdominal pain, constipation, diarrhea, nausea and vomiting.  Genitourinary: Positive for dysuria and frequency.  Musculoskeletal: Negative for back pain.  Neurological: Negative for dizziness.      Objective:    Physical Exam Constitutional:      Appearance: She is well-developed.  HENT:     Head: Normocephalic and atraumatic.  Neck:      Thyroid: No thyromegaly.  Cardiovascular:     Rate and Rhythm: Normal rate and regular rhythm.     Heart sounds: Normal heart sounds.  Pulmonary:     Breath sounds: Normal breath sounds.  Abdominal:     General: Bowel sounds are normal.     Palpations: Abdomen is soft.     Tenderness: There is  no abdominal tenderness.  Musculoskeletal:     Cervical back: Neck supple.     Right lower leg: No edema.     Left lower leg: No edema.     BP 116/80   Pulse 84   Temp 97.6 F (36.4 C)   Ht 5' 5.5" (1.664 m)   Wt 118 lb (53.5 kg)   PF 97 L/min   BMI 19.34 kg/m  Wt Readings from Last 3 Encounters:  08/05/20 118 lb (53.5 kg)  08/14/18 117 lb (53.1 kg)  06/22/18 119 lb 1.6 oz (54 kg)     Health Maintenance Due  Topic Date Due  . TETANUS/TDAP  04/02/2018  . INFLUENZA VACCINE  05/25/2020    There are no preventive care reminders to display for this patient.  Lab Results  Component Value Date   TSH 1.71 05/21/2013   Lab Results  Component Value Date   WBC 10.0 05/21/2013   HGB 12.2 01/13/2017   HCT 36.0 01/13/2017   MCV 93.3 05/21/2013   PLT 171.0 05/21/2013   Lab Results  Component Value Date   NA 137 09/17/2019   K 4.0 09/17/2019   CO2 26 09/17/2019   GLUCOSE 88 09/17/2019   BUN 21 09/17/2019   CREATININE 0.77 09/17/2019   BILITOT 0.8 08/30/2017   ALKPHOS 47 08/30/2017   AST 21 08/30/2017   ALT 11 08/30/2017   PROT 6.7 08/30/2017   ALBUMIN 4.1 08/30/2017   CALCIUM 9.2 09/17/2019   GFR 71.15 09/17/2019   Lab Results  Component Value Date   CHOL 205 (H) 05/21/2013   Lab Results  Component Value Date   HDL 58.40 05/21/2013   Lab Results  Component Value Date   LDLCALC 110 (H) 04/14/2012   Lab Results  Component Value Date   TRIG 95.0 05/21/2013   Lab Results  Component Value Date   CHOLHDL 4 05/21/2013   No results found for: HGBA1C    Assessment & Plan:   Problem List Items Addressed This Visit    None    Visit Diagnoses    Dysuria     -  Primary   Relevant Orders   POCT urinalysis dipstick (Completed)   Urine Culture   Leg cramps       Relevant Orders   Basic metabolic panel   Magnesium     -Urine dipstick shows 3+ leukocytes.  Suspect she probably does have UTI.  Urine culture sent.  Start Keflex 500 mg 3 times daily pending culture results.  We have strongly advised her to drink plenty of fluids -Follow-up immediately for any fever, weakness, vomiting, or other concerns -We discussed issues regarding muscle cramps.  Increase hydration.  Increase potassium rich foods.  We will check basic metabolic panel and magnesium level. -If above labs normal consider B complex vitamin  Meds ordered this encounter  Medications  . cephALEXin (KEFLEX) 500 MG capsule    Sig: Take 1 capsule (500 mg total) by mouth 3 (three) times daily.    Dispense:  21 capsule    Refill:  0    Follow-up: No follow-ups on file.    [name], MD

## 2020-08-05 NOTE — Patient Instructions (Addendum)
If you have lab work done today you will be contacted with your lab results within the next 2 weeks.  If you have not heard from [place] then please contact us. The fastest way to get your results is to register for My Chart.   IF you received an x-ray today, you will receive an invoice from [facility]. Please contact [facility] at [phone] with questions or concerns regarding your invoice.   IF you received labwork today, you will receive an invoice from [facility]. Please contact [facility] at [phone] with questions or concerns regarding your invoice.   Our billing staff will not be able to assist you with questions regarding bills from these companies.  You will be contacted with the lab results as soon as they are available. The fastest way to get your results is to activate your My Chart account. Instructions are located on the last page of this paperwork. If you have not heard from [place] regarding the results in 2 weeks, please contact this office.     Urinary Tract Infection, Adult  A urinary tract infection (UTI) is an infection of any part of the urinary tract. The urinary tract includes the kidneys, ureters, bladder, and urethra. These organs make, store, and get rid of urine in the body. Your health care provider may use other names to describe the infection. An upper UTI affects the ureters and kidneys (pyelonephritis). A lower UTI affects the bladder (cystitis) and urethra (urethritis). What are the causes? Most urinary tract infections are caused by bacteria in your genital area, around the entrance to your urinary tract (urethra). These bacteria grow and cause inflammation of your urinary tract. What increases the risk? You are more likely to develop this condition if:  You have a urinary catheter that stays in place (indwelling).  You are not able to control when you urinate or have a bowel movement (you have incontinence).  You are female and  you: ? Use a spermicide or diaphragm for birth control. ? Have low estrogen levels. ? Are pregnant.  You have certain genes that increase your risk (genetics).  You are sexually active.  You take antibiotic medicines.  You have a condition that causes your flow of urine to slow down, such as: ? An enlarged prostate, if you are female. ? Blockage in your urethra (stricture). ? A kidney stone. ? A nerve condition that affects your bladder control (neurogenic bladder). ? Not getting enough to drink, or not urinating often.  You have certain medical conditions, such as: ? Diabetes. ? A weak disease-fighting system (immunesystem). ? Sickle cell disease. ? Gout. ? Spinal cord injury. What are the signs or symptoms? Symptoms of this condition include:  Needing to urinate right away (urgently).  Frequent urination or passing small amounts of urine frequently.  Pain or burning with urination.  Blood in the urine.  Urine that smells bad or unusual.  Trouble urinating.  Cloudy urine.  Vaginal discharge, if you are female.  Pain in the abdomen or the lower back. You may also have:  Vomiting or a decreased appetite.  Confusion.  Irritability or tiredness.  A fever.  Diarrhea. The first symptom in older adults may be confusion. In some cases, they may not have any symptoms until the infection has worsened. How is this diagnosed? This condition is diagnosed based on your medical history and a physical exam. You may also have other tests, including:  Urine tests.  Blood tests.  Tests  for sexually transmitted infections (STIs). If you have had more than one UTI, a cystoscopy or imaging studies may be done to determine the cause of the infections. How is this treated? Treatment for this condition includes:  Antibiotic medicine.  Over-the-counter medicines to treat discomfort.  Drinking enough water to stay hydrated. If you have frequent infections or have other  conditions such as a kidney stone, you may need to see a health care provider who specializes in the urinary tract (urologist). In rare cases, urinary tract infections can cause sepsis. Sepsis is a life-threatening condition that occurs when the body responds to an infection. Sepsis is treated in the hospital with IV antibiotics, fluids, and other medicines. Follow these instructions at home:  Medicines  Take over-the-counter and prescription medicines only as told by your health care provider.  If you were prescribed an antibiotic medicine, take it as told by your health care provider. Do not stop using the antibiotic even if you start to feel better. General instructions  Make sure you: ? Empty your bladder often and completely. Do not hold urine for long periods of time. ? Empty your bladder after sex. ? Wipe from front to back after a bowel movement if you are female. Use each tissue one time when you wipe.  Drink enough fluid to keep your urine pale yellow.  Keep all follow-up visits as told by your health care provider. This is important. Contact a health care provider if:  Your symptoms do not get better after 1-2 days.  Your symptoms go away and then return. Get help right away if you have:  Severe pain in your back or your lower abdomen.  A fever.  Nausea or vomiting. Summary  A urinary tract infection (UTI) is an infection of any part of the urinary tract, which includes the kidneys, ureters, bladder, and urethra.  Most urinary tract infections are caused by bacteria in your genital area, around the entrance to your urinary tract (urethra).  Treatment for this condition often includes antibiotic medicines.  If you were prescribed an antibiotic medicine, take it as told by your health care provider. Do not stop using the antibiotic even if you start to feel better.  Keep all follow-up visits as told by your health care provider. This is important. This information  is not intended to replace advice given to you by your health care provider. Make sure you discuss any questions you have with your health care provider. Document Revised: 09/28/2018 Document Reviewed: 04/20/2018 Elsevier Patient Education  2020 [employer].

## 2020-08-06 LAB — BASIC METABOLIC PANEL
BUN: 21 mg/dL (ref 7–25)
CO2: 27 mmol/L (ref 20–32)
Calcium: 9.8 mg/dL (ref 8.6–10.4)
Chloride: 97 mmol/L — ABNORMAL LOW (ref 98–110)
Creat: 0.77 mg/dL (ref 0.60–0.88)
Glucose, Bld: 89 mg/dL (ref 65–99)
Potassium: 4 mmol/L (ref 3.5–5.3)
Sodium: 133 mmol/L — ABNORMAL LOW (ref 135–146)

## 2020-08-06 LAB — MAGNESIUM: Magnesium: 2.1 mg/dL (ref 1.5–2.5)

## 2020-08-08 LAB — URINE CULTURE
MICRO NUMBER:: 11061566
SPECIMEN QUALITY:: ADEQUATE

## 2020-08-14 ENCOUNTER — Other Ambulatory Visit: Payer: Self-pay | Admitting: Family Medicine

## 2020-09-07 ENCOUNTER — Ambulatory Visit: Payer: Medicare Other | Attending: Internal Medicine

## 2020-09-07 DIAGNOSIS — Z23 Encounter for immunization: Secondary | ICD-10-CM

## 2020-09-07 NOTE — Progress Notes (Signed)
   Covid-19 Vaccination Clinic  Name:  IOLA TURRI    MRN: [id number] DOB: 22-Oct-1932  09/07/2020  Ms. Evetts was observed post Covid-19 immunization for 15 minutes without incident. She was provided with Vaccine Information Sheet and instruction to access the V-Safe system.   Ms. Siegrist was instructed to call 911 with any severe reactions post vaccine: Marland Kitchen Difficulty breathing  . Swelling of face and throat  . A fast heartbeat  . A bad rash all over body  . Dizziness and weakness   Immunizations Administered    Name Date Dose VIS Date Route   Pfizer COVID-19 Vaccine 09/07/2020  9:13 AM 0.3 mL 08/13/2020 Intramuscular   Manufacturer: [place]   Lot: [id number]   [place]: [id number]

## 2020-09-08 ENCOUNTER — Other Ambulatory Visit: Payer: Self-pay | Admitting: Family Medicine

## 2020-10-01 ENCOUNTER — Other Ambulatory Visit: Payer: Self-pay | Admitting: Family Medicine

## 2020-10-16 NOTE — Progress Notes (Signed)
Subjective:   [name] is a 86 y.o. female who presents for Medicare Annual (Subsequent) preventive examination.  Review of Systems    N/A  Cardiac Risk Factors include: advanced age (>57men, >28 women);hypertension;dyslipidemia     Objective:    Today's Vitals   10/27/20 1032  BP: 122/72  Pulse: 86  Temp: 97.6 F (36.4 C)  TempSrc: Oral  SpO2: 97%  Weight: 118 lb 4 oz (53.6 kg)  Height: 5\' 6"  (1.676 m)   Body mass index is 19.09 kg/m.  Advanced Directives 10/27/2020 05/23/2018 01/13/2017 01/05/2017 12/01/2016  Does Patient Have a Medical Advance Directive? Yes Yes Yes Yes Yes  Type of Paramedic of [place];Living will - [place];Living will [place];Living will -  Does patient want to make changes to medical advance directive? No - Patient declined - No - Patient declined No - Patient declined -  Copy of [place] in Chart? No - copy requested - No - copy requested No - copy requested -  Would patient like information on creating a medical advance directive? - - No - Patient declined No - Patient declined -    Current Medications (verified) Outpatient Encounter Medications as of 10/27/2020  Medication Sig  . b complex vitamins tablet Take 1 tablet by mouth daily.  . bisoprolol-hydrochlorothiazide (ZIAC) 5-6.25 MG tablet Take 1 tablet by mouth daily.  . Glucosamine-Chondroitin 250-200 MG TABS Take 1 tablet by mouth daily.  . Multiple Minerals-Vitamins (CITRACAL PLUS) TABS Take 1 tablet by mouth daily.  . Omega-3 Fatty Acids (FISH OIL) 1000 MG CAPS Take 2 capsules (2,000 mg total) by mouth 2 (two) times daily.  . Vitamin D, Ergocalciferol, (DRISDOL) 1.25 MG (50000 UNIT) CAPS capsule TAKE 1 CAPSULE (50,000 UNITS TOTAL) BY MOUTH EVERY 7 (SEVEN) DAYS.  . hydrocortisone-pramoxine (ANALPRAM HC) 2.5-1 % rectal cream Place 1 application rectally 3 (three) times daily. (Patient not taking:  Reported on 10/27/2020)  . meloxicam (MOBIC) 7.5 MG tablet Take 7.5 mg by mouth daily. (Patient not taking: Reported on 10/27/2020)  . [DISCONTINUED] benzonatate (TESSALON) 100 MG capsule Take 1 capsule (100 mg total) by mouth every 8 (eight) hours.  . [DISCONTINUED] cephALEXin (KEFLEX) 500 MG capsule Take 1 capsule (500 mg total) by mouth 4 (four) times daily.  . [DISCONTINUED] cephALEXin (KEFLEX) 500 MG capsule Take 1 capsule (500 mg total) by mouth 3 (three) times daily.  . [DISCONTINUED] ondansetron (ZOFRAN) 4 MG tablet Take 1 tablet (4 mg total) by mouth every 8 (eight) hours as needed for nausea or vomiting.   No facility-administered encounter medications on file as of 10/27/2020.    Allergies (verified) Codeine and Flagyl [metronidazole hcl]   History: Past Medical History:  Diagnosis Date  . Constipation   . Diverticulosis   . Hypertension   . Inguinal hernia, left   . Irritable bowel syndrome   . Osteoporosis   . UTI (lower urinary tract infection)    hx of frequent uti's   Past Surgical History:  Procedure Laterality Date  . APPENDECTOMY    . BREAST SURGERY Left    biopsy  . CERVICAL FUSION    . colon polyps    . HEMORRHOID SURGERY    . HIP SURGERY Right   . INGUINAL HERNIA REPAIR Left 01/13/2017   Procedure: LEFT INGUINAL HERNIA REPAIR WITH MESH;  Surgeon: Jackolyn Confer, MD;  Location: [facility];  Service: General;  Laterality: Left;  . INSERTION  OF MESH Left 01/13/2017   Procedure: INSERTION OF MESH;  Surgeon: Jackolyn Confer, MD;  Location: [facility];  Service: General;  Laterality: Left;  . LUMBAR FUSION    . LUMBAR LAMINECTOMY    . partial resection of colon for tumor    . SHOULDER SURGERY Left   . TOTAL HIP ARTHROPLASTY     right   Family History  Problem Relation Age of Onset  . Colon cancer Mother   . Cancer Father   . Lung cancer Father   . Ovarian cancer Sister   . Breast cancer Sister   . Liver cancer Brother   .  Heart disease Brother    Social History   Socioeconomic History  . Marital status: Married    Spouse name: Not on file  . Number of children: Not on file  . Years of education: Not on file  . Highest education level: Not on file  Occupational History  . Occupation: retired    Fish farm manager: RETIRED  Tobacco Use  . Smoking status: Never Smoker  . Smokeless tobacco: Current User  Substance and Sexual Activity  . Alcohol use: No  . Drug use: No  . Sexual activity: Not Currently  Other Topics Concern  . 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 Difficulty of Paying Living Expenses: Not hard at all  Food Insecurity: No Food Insecurity  . Worried About Charity fundraiser in the Last Year: Never true  . Ran Out of Food in the Last Year: Never true  Transportation Needs: No Transportation Needs  . Lack of Transportation (Medical): No  . Lack of Transportation (Non-Medical): No  Physical Activity: Sufficiently Active  . Days of Exercise per Week: 7 days  . Minutes of Exercise per Session: 30 min  Stress: No Stress Concern Present  . Feeling of Stress : Not at all  Social Connections: Moderately Integrated  . Frequency of Communication with Friends and Family: More than three times a week  . Frequency of Social Gatherings with Friends and Family: More than three times a week  . Attends Religious Services: More than 4 times per year  . Active Member of Clubs or Organizations: No  . Attends Archivist Meetings: Never  . Marital Status: Married    Tobacco Counseling Ready to quit: Not Answered Counseling given: Not Answered   Clinical Intake:  Pre-visit preparation completed: Yes  Pain : No/denies pain     Diabetes: No  How often do you need to have someone help you when you read  instructions, pamphlets, or other written materials from your doctor or pharmacy?: 1 - Never What is the last grade level you completed in school?: Some College  Diabetic?No   Interpreter Needed?: No  Information entered by :: [place] of Daily Living In your present state of health, do you have any difficulty performing the following activities: 10/27/2020  Hearing? N  Vision? N  Difficulty concentrating or making decisions? N  Walking or climbing stairs? N  Dressing or bathing? N  Doing errands, shopping? N  Preparing Food and eating ? N  Using the Toilet? N  In the past six months, have you accidently leaked urine? N  Do you  have problems with loss of bowel control? N  Managing your Medications? N  Managing your Finances? N  Housekeeping or managing your Housekeeping? N  Some recent data might be hidden    Patient Care Team: Eulas Post, MD as PCP - General (Family Medicine)  Indicate any recent Medical Services you may have received from other than [facility] providers in the past year (date may be approximate).     Assessment:   This is a routine wellness examination for [place].  Hearing/Vision screen  Hearing Screening   125Hz  250Hz  500Hz  1000Hz  2000Hz  3000Hz  4000Hz  6000Hz  8000Hz   Right ear:           Left ear:           Vision Screening Comments: Patient states gets eyes examined once per year. Denies any visual changes    Dietary issues and exercise activities discussed: Current Exercise Habits: Home exercise routine, Type of exercise: walking;stretching, Time (Minutes): 30, Frequency (Times/Week): 7, Weekly Exercise (Minutes/Week): 210, Intensity: Moderate  Goals    . Patient Stated     To maintain the same plan     . stay independent     Keep exercising, stay engaged in activities and food banks Keep volunteering       Depression Screen PHQ 2/9 Scores 10/27/2020 05/23/2018 12/01/2016 11/24/2015 11/24/2015 11/22/2014 09/14/2014  PHQ - 2  Score 0 0 0 0 0 0 0  PHQ- 9 Score 0 - - - - - -    Fall Risk Fall Risk  10/27/2020 08/05/2020 09/18/2019 06/22/2018 05/23/2018  Falls in the past year? 1 0 0 Yes Yes  Comment - - Emmi Telephone Survey: data to providers prior to load - -  Number falls in past yr: 0 0 - 1 1  Comment - - - - only tripped x 1   Injury with Fall? 0 0 - Yes No  Risk for fall due to : No Fall Risks - - - -  Follow up Falls evaluation completed;Falls prevention discussed - - - Education provided    FALL RISK PREVENTION PERTAINING TO THE HOME:  Any stairs in or around the home? No  If so, are there any without handrails? No  Home free of loose throw rugs in walkways, pet beds, electrical cords, etc? Yes  Adequate lighting in your home to reduce risk of falls? Yes   ASSISTIVE DEVICES UTILIZED TO PREVENT FALLS:  Life alert? No  Use of a cane, walker or w/c? No  Grab bars in the bathroom? No  Shower chair or bench in shower? No  Elevated toilet seat or a handicapped toilet? No   TIMED UP AND GO:  Was the test performed? Yes .  Length of time to ambulate 10 feet: 4 sec.   Gait steady and fast without use of assistive device  Cognitive Function: MMSE - Mini Mental State Exam 05/23/2018  Not completed: (No Data)     6CIT Screen 10/27/2020 12/01/2016  What Year? 0 points 0 points  What month? 0 points 0 points  What time? 0 points 0 points  Count back from 20 0 points 0 points  Months in reverse 0 points 0 points  Repeat phrase 6 points 2 points  Total Score 6 2    Immunizations Immunization History  Administered Date(s) Administered  . H1N1 10/02/2008  . Influenza Split 09/30/2011, 10/10/2012  . Influenza Whole 09/04/2007, 07/30/2008, 10/08/2009, 09/11/2010  . Influenza, High Dose Seasonal PF 07/28/2017  . Influenza,inj,Quad PF,6+ Mos  07/11/2013, 09/18/2018, 07/27/2019, 08/05/2020  . Influenza-Unspecified 09/11/2014, 08/25/2016, 08/04/2020  . PFIZER SARS-COV-2 Vaccination 02/04/2020, 02/27/2020,  09/07/2020  . Pneumococcal Conjugate-13 11/22/2014  . Pneumococcal Polysaccharide-23 12/04/2007  . Td 04/02/2008  . Zoster 11/26/2013    TDAP status: Due, Education has been provided regarding the importance of this vaccine. Advised may receive this vaccine at local pharmacy or Health Dept. Aware to provide a copy of the vaccination record if obtained from local pharmacy or Health Dept. Verbalized acceptance and understanding.  Flu Vaccine status: Up to date  Pneumococcal vaccine status: Up to date  Covid-19 vaccine status: Completed vaccines  Qualifies for Shingles Vaccine? Yes   Zostavax completed Yes   Shingrix Completed?: No.    Education has been provided regarding the importance of this vaccine. Patient has been advised to call insurance company to determine out of pocket expense if they have not yet received this vaccine. Advised may also receive vaccine at local pharmacy or Health Dept. Verbalized acceptance and understanding.  Screening Tests Health Maintenance  Topic Date Due  . TETANUS/TDAP  08/05/2021 (Originally 04/02/2018)  . INFLUENZA VACCINE  Completed  . DEXA SCAN  Completed  . COVID-19 Vaccine  Completed  . PNA vac Low Risk Adult  Completed    Health Maintenance  There are no preventive care reminders to display for this patient.  Colorectal cancer screening: No longer required.   Mammogram status: No longer required due to age.  Bone Density status: Completed 11/28/2017. Results reflect: Bone density results: OSTEOPOROSIS. Repeat every 2 years.  Lung Cancer Screening: (Low Dose CT Chest recommended if Age 84-80 years, 30 pack-year currently smoking OR have quit w/in 15years.) does not qualify.   Lung Cancer Screening Referral: N/A   Additional Screening:  Hepatitis C Screening: does not qualify;   Vision Screening: Recommended annual ophthalmology exams for early detection of glaucoma and other disorders of the eye. Is the patient up to date with their  annual eye exam?  Yes  Who is the provider or what is the name of the office in which the patient attends annual eye exams? [facility] [address] unable to remember exact name If pt is not established with a provider, would they like to be referred to a provider to establish care? No .   Dental Screening: Recommended annual dental exams for proper oral hygiene  Community Resource Referral / Chronic Care Management: CRR required this visit?  No   CCM required this visit?  No      Plan:     I have personally reviewed and noted the following in the patient's chart:   . Medical and social history . Use of alcohol, tobacco or illicit drugs  . Current medications and supplements . Functional ability and status . Nutritional status . Physical activity . Advanced directives . List of other physicians . Hospitalizations, surgeries, and ER visits in previous 12 months . Vitals . Screenings to include cognitive, depression, and falls . Referrals and appointments  In addition, I have reviewed and discussed with patient certain preventive protocols, quality metrics, and best practice recommendations. A written personalized care plan for preventive services as well as general preventive health recommendations were provided to patient.     Ofilia Neas, LPN   [id number]   Nurse Notes: None

## 2020-10-24 ENCOUNTER — Other Ambulatory Visit: Payer: Self-pay | Admitting: Family Medicine

## 2020-10-27 ENCOUNTER — Ambulatory Visit (INDEPENDENT_AMBULATORY_CARE_PROVIDER_SITE_OTHER): Payer: Medicare Other

## 2020-10-27 ENCOUNTER — Other Ambulatory Visit: Payer: Self-pay

## 2020-10-27 VITALS — BP 122/72 | HR 86 | Temp 97.6°F | Ht 66.0 in | Wt 118.2 lb

## 2020-10-27 DIAGNOSIS — Z0000 Encounter for general adult medical examination without abnormal findings: Secondary | ICD-10-CM

## 2020-10-27 NOTE — Patient Instructions (Signed)
[name] , Thank you for taking time to come for your Medicare Wellness Visit. I appreciate your ongoing commitment to your health goals. Please review the following plan we discussed and let me know if I can assist you in the future.   Screening recommendations/referrals: Colonoscopy: No longer required  Mammogram: No longer required  Bone Density: No longer required  Recommended yearly ophthalmology/optometry visit for glaucoma screening and checkup Recommended yearly dental visit for hygiene and checkup  Vaccinations: Influenza vaccine: Up to date, next due fall 2022  Pneumococcal vaccine: Completed series  Tdap vaccine: Currently due, you may contact your insurance to discuss cost or await and injury to receive  Shingles vaccine: Currently due for Shingrix, if you wish to receive we recommend that you do so at your local pharmacy.     Advanced directives: Please bring in copies of your advanced medical directives so that we may scan them into your chart.   Conditions/risks identified: None   Next appointment: 10/28/2021 @ 10:30 am with [facility] Nurse Health Advisor    Preventive Care 65 Years and Older, Female Preventive care refers to lifestyle choices and visits with your health care provider that can promote health and wellness. What does preventive care include?  A yearly physical exam. This is also called an annual well check.  Dental exams once or twice a year.  Routine eye exams. Ask your health care provider how often you should have your eyes checked.  Personal lifestyle choices, including:  Daily care of your teeth and gums.  Regular physical activity.  Eating a healthy diet.  Avoiding tobacco and drug use.  Limiting alcohol use.  Practicing safe sex.  Taking low-dose aspirin every day.  Taking vitamin and mineral supplements as recommended by your health care provider. What happens during an annual well check? The services and screenings done by  your health care provider during your annual well check will depend on your age, overall health, lifestyle risk factors, and family history of disease. Counseling  Your health care provider may ask you questions about your:  Alcohol use.  Tobacco use.  Drug use.  Emotional well-being.  Home and relationship well-being.  Sexual activity.  Eating habits.  History of falls.  Memory and ability to understand (cognition).  Work and work Astronomer.  Reproductive health. Screening  You may have the following tests or measurements:  Height, weight, and BMI.  Blood pressure.  Lipid and cholesterol levels. These may be checked every 5 years, or more frequently if you are over 81 years old.  Skin check.  Lung cancer screening. You may have this screening every year starting at age 17 if you have a 30-pack-year history of smoking and currently smoke or have quit within the past 15 years.  Fecal occult blood test (FOBT) of the stool. You may have this test every year starting at age 60.  Flexible sigmoidoscopy or colonoscopy. You may have a sigmoidoscopy every 5 years or a colonoscopy every 10 years starting at age 30.  Hepatitis C blood test.  Hepatitis B blood test.  Sexually transmitted disease (STD) testing.  Diabetes screening. This is done by checking your blood sugar (glucose) after you have not eaten for a while (fasting). You may have this done every 1-3 years.  Bone density scan. This is done to screen for osteoporosis. You may have this done starting at age 64.  Mammogram. This may be done every 1-2 years. Talk to your health care provider about how  often you should have regular mammograms. Talk with your health care provider about your test results, treatment options, and if necessary, the need for more tests. Vaccines  Your health care provider may recommend certain vaccines, such as:  Influenza vaccine. This is recommended every year.  Tetanus,  diphtheria, and acellular pertussis (Tdap, Td) vaccine. You may need a Td booster every 10 years.  Zoster vaccine. You may need this after age 82.  Pneumococcal 13-valent conjugate (PCV13) vaccine. One dose is recommended after age 1.  Pneumococcal polysaccharide (PPSV23) vaccine. One dose is recommended after age 79. Talk to your health care provider about which screenings and vaccines you need and how often you need them. This information is not intended to replace advice given to you by your health care provider. Make sure you discuss any questions you have with your health care provider. Document Released: 11/07/2015 Document Revised: 06/30/2016 Document Reviewed: 08/12/2015 Elsevier Interactive Patient Education  2017 [place] Prevention in the Home Falls can cause injuries. They can happen to people of all ages. There are many things you can do to make your home safe and to help prevent falls. What can I do on the outside of my home?  Regularly fix the edges of walkways and driveways and fix any cracks.  Remove anything that might make you trip as you walk through a door, such as a raised step or threshold.  Trim any bushes or trees on the path to your home.  Use bright outdoor lighting.  Clear any walking paths of anything that might make someone trip, such as rocks or tools.  Regularly check to see if handrails are loose or broken. Make sure that both sides of any steps have handrails.  Any raised decks and porches should have guardrails on the edges.  Have any leaves, snow, or ice cleared regularly.  Use sand or salt on walking paths during winter.  Clean up any spills in your garage right away. This includes oil or grease spills. What can I do in the bathroom?  Use night lights.  Install grab bars by the toilet and in the tub and shower. Do not use towel bars as grab bars.  Use non-skid mats or decals in the tub or shower.  If you need to sit down in  the shower, use a plastic, non-slip stool.  Keep the floor dry. Clean up any water that spills on the floor as soon as it happens.  Remove soap buildup in the tub or shower regularly.  Attach bath mats securely with double-sided non-slip rug tape.  Do not have throw rugs and other things on the floor that can make you trip. What can I do in the bedroom?  Use night lights.  Make sure that you have a light by your bed that is easy to reach.  Do not use any sheets or blankets that are too big for your bed. They should not hang down onto the floor.  Have a firm chair that has side arms. You can use this for support while you get dressed.  Do not have throw rugs and other things on the floor that can make you trip. What can I do in the kitchen?  Clean up any spills right away.  Avoid walking on wet floors.  Keep items that you use a lot in easy-to-reach places.  If you need to reach something above you, use a strong step stool that has a grab bar.  Keep electrical  cords out of the way.  Do not use floor polish or wax that makes floors slippery. If you must use wax, use non-skid floor wax.  Do not have throw rugs and other things on the floor that can make you trip. What can I do with my stairs?  Do not leave any items on the stairs.  Make sure that there are handrails on both sides of the stairs and use them. Fix handrails that are broken or loose. Make sure that handrails are as long as the stairways.  Check any carpeting to make sure that it is firmly attached to the stairs. Fix any carpet that is loose or worn.  Avoid having throw rugs at the top or bottom of the stairs. If you do have throw rugs, attach them to the floor with carpet tape.  Make sure that you have a light switch at the top of the stairs and the bottom of the stairs. If you do not have them, ask someone to add them for you. What else can I do to help prevent falls?  Wear shoes that:  Do not have high  heels.  Have rubber bottoms.  Are comfortable and fit you well.  Are closed at the toe. Do not wear sandals.  If you use a stepladder:  Make sure that it is fully opened. Do not climb a closed stepladder.  Make sure that both sides of the stepladder are locked into place.  Ask someone to hold it for you, if possible.  Clearly mark and make sure that you can see:  Any grab bars or handrails.  First and last steps.  Where the edge of each step is.  Use tools that help you move around (mobility aids) if they are needed. These include:  Canes.  Walkers.  Scooters.  Crutches.  Turn on the lights when you go into a dark area. Replace any light bulbs as soon as they burn out.  Set up your furniture so you have a clear path. Avoid moving your furniture around.  If any of your floors are uneven, fix them.  If there are any pets around you, be aware of where they are.  Review your medicines with your doctor. Some medicines can make you feel dizzy. This can increase your chance of falling. Ask your doctor what other things that you can do to help prevent falls. This information is not intended to replace advice given to you by your health care provider. Make sure you discuss any questions you have with your health care provider. Document Released: 08/07/2009 Document Revised: 03/18/2016 Document Reviewed: 11/15/2014 Elsevier Interactive Patient Education  2017 [employer].

## 2020-11-04 ENCOUNTER — Other Ambulatory Visit: Payer: Self-pay

## 2020-11-04 ENCOUNTER — Telehealth: Payer: Self-pay | Admitting: Family Medicine

## 2020-11-04 MED ORDER — VIACTIV CALCIUM PLUS D 650-12.5-40 MG-MCG PO CHEW: CHEWABLE_TABLET | ORAL | 3 refills | Status: DC

## 2020-11-04 MED ORDER — VITAMIN D (ERGOCALCIFEROL) 1.25 MG (50000 UNIT) PO CAPS: 50000.0000 [IU] | ORAL_CAPSULE | ORAL | 2 refills | Status: DC

## 2020-11-04 MED ORDER — FISH OIL 1000 MG PO CAPS: 2000.0000 mg | ORAL_CAPSULE | Freq: Two times a day (BID) | ORAL | 3 refills | Status: DC

## 2020-11-04 MED ORDER — GLUCOSAMINE-CHONDROITIN 250-200 MG PO TABS: 1.0000 | ORAL_TABLET | Freq: Every day | ORAL | 3 refills | Status: DC

## 2020-11-04 NOTE — Telephone Encounter (Signed)
Patient is calling and stated that she left some paperwork with the provider and wanted to see if they had been completed, please advise. CB is [phone]

## 2020-11-04 NOTE — Telephone Encounter (Signed)
Informed patient paperwork has been complete and put in the mail.

## 2020-11-04 NOTE — Telephone Encounter (Signed)
Letter of medical necessity form filled out.  Medications printed.  Mailed.

## 2020-11-14 ENCOUNTER — Encounter (HOSPITAL_COMMUNITY): Payer: Self-pay | Admitting: *Deleted

## 2020-11-14 ENCOUNTER — Emergency Department (HOSPITAL_COMMUNITY): Payer: Medicare Other

## 2020-11-14 ENCOUNTER — Inpatient Hospital Stay (HOSPITAL_COMMUNITY)
Admission: EM | Admit: 2020-11-14 | Discharge: 2020-11-22 | DRG: 481 | Disposition: A | Discharge status: Skilled Nursing Facility | Payer: Medicare Other | Attending: Internal Medicine | Admitting: Internal Medicine

## 2020-11-14 ENCOUNTER — Other Ambulatory Visit: Payer: Self-pay

## 2020-11-14 DIAGNOSIS — M978XXD Periprosthetic fracture around other internal prosthetic joint, subsequent encounter: Secondary | ICD-10-CM | POA: Diagnosis not present

## 2020-11-14 DIAGNOSIS — D62 Acute posthemorrhagic anemia: Secondary | ICD-10-CM | POA: Diagnosis not present

## 2020-11-14 DIAGNOSIS — Z7982 Long term (current) use of aspirin: Secondary | ICD-10-CM

## 2020-11-14 DIAGNOSIS — Z87440 Personal history of urinary (tract) infections: Secondary | ICD-10-CM | POA: Diagnosis not present

## 2020-11-14 DIAGNOSIS — K7689 Other specified diseases of liver: Secondary | ICD-10-CM | POA: Diagnosis not present

## 2020-11-14 DIAGNOSIS — Z9181 History of falling: Secondary | ICD-10-CM | POA: Diagnosis not present

## 2020-11-14 DIAGNOSIS — K5790 Diverticulosis of intestine, part unspecified, without perforation or abscess without bleeding: Secondary | ICD-10-CM | POA: Diagnosis not present

## 2020-11-14 DIAGNOSIS — M25551 Pain in right hip: Secondary | ICD-10-CM | POA: Diagnosis not present

## 2020-11-14 DIAGNOSIS — Z20822 Contact with and (suspected) exposure to covid-19: Secondary | ICD-10-CM | POA: Diagnosis present

## 2020-11-14 DIAGNOSIS — Z981 Arthrodesis status: Secondary | ICD-10-CM

## 2020-11-14 DIAGNOSIS — S72001A Fracture of unspecified part of neck of right femur, initial encounter for closed fracture: Secondary | ICD-10-CM | POA: Diagnosis not present

## 2020-11-14 DIAGNOSIS — R2681 Unsteadiness on feet: Secondary | ICD-10-CM | POA: Diagnosis not present

## 2020-11-14 DIAGNOSIS — S72009A Fracture of unspecified part of neck of unspecified femur, initial encounter for closed fracture: Secondary | ICD-10-CM | POA: Diagnosis present

## 2020-11-14 DIAGNOSIS — Z79899 Other long term (current) drug therapy: Secondary | ICD-10-CM | POA: Diagnosis not present

## 2020-11-14 DIAGNOSIS — Z8249 Family history of ischemic heart disease and other diseases of the circulatory system: Secondary | ICD-10-CM

## 2020-11-14 DIAGNOSIS — R339 Retention of urine, unspecified: Secondary | ICD-10-CM | POA: Diagnosis present

## 2020-11-14 DIAGNOSIS — M2550 Pain in unspecified joint: Secondary | ICD-10-CM | POA: Diagnosis not present

## 2020-11-14 DIAGNOSIS — I10 Essential (primary) hypertension: Secondary | ICD-10-CM | POA: Diagnosis present

## 2020-11-14 DIAGNOSIS — W000XXA Fall on same level due to ice and snow, initial encounter: Secondary | ICD-10-CM | POA: Diagnosis present

## 2020-11-14 DIAGNOSIS — D72829 Elevated white blood cell count, unspecified: Secondary | ICD-10-CM | POA: Diagnosis present

## 2020-11-14 DIAGNOSIS — M6281 Muscle weakness (generalized): Secondary | ICD-10-CM | POA: Diagnosis not present

## 2020-11-14 DIAGNOSIS — M1990 Unspecified osteoarthritis, unspecified site: Secondary | ICD-10-CM | POA: Diagnosis not present

## 2020-11-14 DIAGNOSIS — Z01810 Encounter for preprocedural cardiovascular examination: Secondary | ICD-10-CM

## 2020-11-14 DIAGNOSIS — Z885 Allergy status to narcotic agent status: Secondary | ICD-10-CM

## 2020-11-14 DIAGNOSIS — Z4789 Encounter for other orthopedic aftercare: Secondary | ICD-10-CM | POA: Diagnosis not present

## 2020-11-14 DIAGNOSIS — S72351D Displaced comminuted fracture of shaft of right femur, subsequent encounter for closed fracture with routine healing: Secondary | ICD-10-CM | POA: Diagnosis not present

## 2020-11-14 DIAGNOSIS — I4891 Unspecified atrial fibrillation: Secondary | ICD-10-CM | POA: Diagnosis not present

## 2020-11-14 DIAGNOSIS — Z7401 Bed confinement status: Secondary | ICD-10-CM | POA: Diagnosis not present

## 2020-11-14 DIAGNOSIS — Y9301 Activity, walking, marching and hiking: Secondary | ICD-10-CM | POA: Diagnosis present

## 2020-11-14 DIAGNOSIS — R41841 Cognitive communication deficit: Secondary | ICD-10-CM | POA: Diagnosis not present

## 2020-11-14 DIAGNOSIS — W000XXD Fall on same level due to ice and snow, subsequent encounter: Secondary | ICD-10-CM | POA: Diagnosis not present

## 2020-11-14 DIAGNOSIS — S72041A Displaced fracture of base of neck of right femur, initial encounter for closed fracture: Secondary | ICD-10-CM | POA: Diagnosis not present

## 2020-11-14 DIAGNOSIS — Z66 Do not resuscitate: Secondary | ICD-10-CM | POA: Diagnosis not present

## 2020-11-14 DIAGNOSIS — Z96641 Presence of right artificial hip joint: Secondary | ICD-10-CM | POA: Diagnosis not present

## 2020-11-14 DIAGNOSIS — Z888 Allergy status to other drugs, medicaments and biological substances status: Secondary | ICD-10-CM

## 2020-11-14 DIAGNOSIS — M9701XA Periprosthetic fracture around internal prosthetic right hip joint, initial encounter: Secondary | ICD-10-CM

## 2020-11-14 DIAGNOSIS — M419 Scoliosis, unspecified: Secondary | ICD-10-CM | POA: Diagnosis not present

## 2020-11-14 DIAGNOSIS — R0902 Hypoxemia: Secondary | ICD-10-CM | POA: Diagnosis not present

## 2020-11-14 DIAGNOSIS — K5900 Constipation, unspecified: Secondary | ICD-10-CM | POA: Diagnosis not present

## 2020-11-14 DIAGNOSIS — E559 Vitamin D deficiency, unspecified: Secondary | ICD-10-CM | POA: Diagnosis not present

## 2020-11-14 DIAGNOSIS — Z01818 Encounter for other preprocedural examination: Secondary | ICD-10-CM | POA: Diagnosis not present

## 2020-11-14 DIAGNOSIS — D649 Anemia, unspecified: Secondary | ICD-10-CM | POA: Diagnosis not present

## 2020-11-14 DIAGNOSIS — N179 Acute kidney failure, unspecified: Secondary | ICD-10-CM | POA: Diagnosis not present

## 2020-11-14 DIAGNOSIS — M978XXA Periprosthetic fracture around other internal prosthetic joint, initial encounter: Principal | ICD-10-CM

## 2020-11-14 DIAGNOSIS — M810 Age-related osteoporosis without current pathological fracture: Secondary | ICD-10-CM | POA: Diagnosis present

## 2020-11-14 DIAGNOSIS — R52 Pain, unspecified: Secondary | ICD-10-CM | POA: Diagnosis not present

## 2020-11-14 DIAGNOSIS — Z96649 Presence of unspecified artificial hip joint: Secondary | ICD-10-CM

## 2020-11-14 DIAGNOSIS — R278 Other lack of coordination: Secondary | ICD-10-CM | POA: Diagnosis not present

## 2020-11-14 DIAGNOSIS — Z4889 Encounter for other specified surgical aftercare: Secondary | ICD-10-CM | POA: Diagnosis not present

## 2020-11-14 DIAGNOSIS — R29898 Other symptoms and signs involving the musculoskeletal system: Secondary | ICD-10-CM | POA: Diagnosis not present

## 2020-11-14 DIAGNOSIS — D500 Iron deficiency anemia secondary to blood loss (chronic): Secondary | ICD-10-CM | POA: Diagnosis not present

## 2020-11-14 DIAGNOSIS — Z471 Aftercare following joint replacement surgery: Secondary | ICD-10-CM | POA: Diagnosis not present

## 2020-11-14 DIAGNOSIS — K589 Irritable bowel syndrome without diarrhea: Secondary | ICD-10-CM | POA: Diagnosis not present

## 2020-11-14 DIAGNOSIS — E785 Hyperlipidemia, unspecified: Secondary | ICD-10-CM | POA: Diagnosis not present

## 2020-11-14 DIAGNOSIS — N281 Cyst of kidney, acquired: Secondary | ICD-10-CM | POA: Diagnosis not present

## 2020-11-14 DIAGNOSIS — S79911A Unspecified injury of right hip, initial encounter: Secondary | ICD-10-CM | POA: Diagnosis not present

## 2020-11-14 LAB — BASIC METABOLIC PANEL
Anion gap: 10 (ref 5–15)
BUN: 24 mg/dL — ABNORMAL HIGH (ref 8–23)
CO2: 24 mmol/L (ref 22–32)
Calcium: 8.8 mg/dL — ABNORMAL LOW (ref 8.9–10.3)
Chloride: 101 mmol/L (ref 98–111)
Creatinine, Ser: 0.76 mg/dL (ref 0.44–1.00)
GFR, Estimated: >60 mL/min (ref >60)
Glucose, Bld: 136 mg/dL — ABNORMAL HIGH (ref 70–99)
Potassium: 3.6 mmol/L (ref 3.5–5.1)
Sodium: 135 mmol/L (ref 135–145)

## 2020-11-14 LAB — SARS CORONAVIRUS 2 (TAT 6-24 HRS): SARS Coronavirus 2: NEGATIVE

## 2020-11-14 LAB — CBC WITH DIFFERENTIAL/PLATELET
Abs Immature Granulocytes: 0.11 10*3/uL — ABNORMAL HIGH (ref 0.00–0.07)
Basophils Absolute: 0.1 10*3/uL (ref 0.0–0.1)
Basophils Relative: 0 %
Eosinophils Absolute: 0 10*3/uL (ref 0.0–0.5)
Eosinophils Relative: 0 %
HCT: 31 % — ABNORMAL LOW (ref 36.0–46.0)
Hemoglobin: 10.1 g/dL — ABNORMAL LOW (ref 12.0–15.0)
Immature Granulocytes: 1 %
Lymphocytes Relative: 7 %
Lymphs Abs: 1.4 10*3/uL (ref 0.7–4.0)
MCH: 31.6 pg (ref 26.0–34.0)
MCHC: 32.6 g/dL (ref 30.0–36.0)
MCV: 96.9 fL (ref 80.0–100.0)
Monocytes Absolute: 0.8 10*3/uL (ref 0.1–1.0)
Monocytes Relative: 4 %
Neutro Abs: 17.6 10*3/uL — ABNORMAL HIGH (ref 1.7–7.7)
Neutrophils Relative %: 88 %
Platelets: 169 10*3/uL (ref 150–400)
RBC: 3.2 MIL/uL — ABNORMAL LOW (ref 3.87–5.11)
RDW: 11.6 % (ref 11.5–15.5)
WBC: 20 10*3/uL — ABNORMAL HIGH (ref 4.0–10.5)
nRBC: 0 % (ref 0.0–0.2)

## 2020-11-14 MED ORDER — LIP MEDEX EX OINT
TOPICAL_OINTMENT | CUTANEOUS | Status: AC
  Filled 2020-11-14: qty 7

## 2020-11-14 MED ORDER — ENOXAPARIN SODIUM 40 MG/0.4ML ~~LOC~~ SOLN: 40.0000 mg | SUBCUTANEOUS | Status: DC

## 2020-11-14 MED ORDER — ONDANSETRON HCL 4 MG/2ML IJ SOLN
4.0000 mg | Freq: Once | INTRAMUSCULAR | Status: AC
  Administered 2020-11-14: 4 mg via INTRAVENOUS
  Filled 2020-11-14: qty 2

## 2020-11-14 MED ORDER — POLYVINYL ALCOHOL 1.4 % OP SOLN
1.0000 [drp] | OPHTHALMIC | Status: DC | PRN
  Filled 2020-11-14: qty 15

## 2020-11-14 MED ORDER — ACETAMINOPHEN 500 MG PO TABS
1000.0000 mg | ORAL_TABLET | Freq: Once | ORAL | Status: AC
  Administered 2020-11-14: 1000 mg via ORAL
  Filled 2020-11-14: qty 2

## 2020-11-14 MED ORDER — TRAMADOL HCL 50 MG PO TABS
50.0000 mg | ORAL_TABLET | Freq: Four times a day (QID) | ORAL | Status: DC
  Administered 2020-11-14 – 2020-11-15 (×3): 50 mg via ORAL
  Filled 2020-11-14 (×3): qty 1

## 2020-11-14 MED ORDER — HYDROCODONE-ACETAMINOPHEN 5-325 MG PO TABS
1.0000 | ORAL_TABLET | ORAL | Status: DC | PRN
Start: 2020-11-14 — End: 2020-11-17
  Administered 2020-11-15 (×2): 1 via ORAL
  Administered 2020-11-16 – 2020-11-17 (×4): 2 via ORAL
  Filled 2020-11-14 (×5): qty 2

## 2020-11-14 MED ORDER — DIPHENHYDRAMINE HCL 12.5 MG/5ML PO ELIX: 12.5000 mg | ORAL_SOLUTION | ORAL | Status: DC | PRN

## 2020-11-14 MED ORDER — BISOPROLOL-HYDROCHLOROTHIAZIDE 5-6.25 MG PO TABS
1.0000 | ORAL_TABLET | Freq: Every day | ORAL | Status: DC
  Filled 2020-11-14: qty 1

## 2020-11-14 MED ORDER — HYDROCODONE-ACETAMINOPHEN 7.5-325 MG PO TABS: 1.0000 | ORAL_TABLET | ORAL | Status: DC | PRN

## 2020-11-14 MED ORDER — MORPHINE SULFATE (PF) 2 MG/ML IV SOLN
2.0000 mg | Freq: Once | INTRAVENOUS | Status: AC
  Administered 2020-11-14: 2 mg via INTRAVENOUS
  Filled 2020-11-14: qty 1

## 2020-11-14 MED ORDER — ACETAMINOPHEN 325 MG PO TABS: 325.0000 mg | ORAL_TABLET | Freq: Four times a day (QID) | ORAL | Status: DC | PRN

## 2020-11-14 MED ORDER — OMEGA-3-ACID ETHYL ESTERS 1 G PO CAPS
1.0000 g | ORAL_CAPSULE | Freq: Two times a day (BID) | ORAL | Status: DC
  Administered 2020-11-14 – 2020-11-22 (×16): 1 g via ORAL
  Filled 2020-11-14 (×17): qty 1

## 2020-11-14 MED ORDER — BISACODYL 10 MG RE SUPP
10.0000 mg | Freq: Every day | RECTAL | Status: DC | PRN
  Administered 2020-11-22: 10 mg via RECTAL
  Filled 2020-11-14: qty 1

## 2020-11-14 MED ORDER — MORPHINE SULFATE (PF) 2 MG/ML IV SOLN
0.5000 mg | INTRAVENOUS | Status: DC | PRN
  Administered 2020-11-16: 1 mg via INTRAVENOUS
  Filled 2020-11-14: qty 1

## 2020-11-14 MED ORDER — DOCUSATE SODIUM 100 MG PO CAPS
100.0000 mg | ORAL_CAPSULE | Freq: Two times a day (BID) | ORAL | Status: DC
  Administered 2020-11-14 – 2020-11-22 (×16): 100 mg via ORAL
  Filled 2020-11-14 (×16): qty 1

## 2020-11-14 MED ORDER — VITAMIN D (ERGOCALCIFEROL) 1.25 MG (50000 UNIT) PO CAPS
50000.0000 [IU] | ORAL_CAPSULE | ORAL | Status: DC
  Administered 2020-11-15 – 2020-11-22 (×2): 50000 [IU] via ORAL
  Filled 2020-11-14 (×2): qty 1

## 2020-11-14 MED ORDER — SENNOSIDES-DOCUSATE SODIUM 8.6-50 MG PO TABS
1.0000 | ORAL_TABLET | Freq: Every evening | ORAL | Status: DC | PRN
  Administered 2020-11-18: 1 via ORAL
  Filled 2020-11-14: qty 1

## 2020-11-14 MED ORDER — HYDROCODONE-ACETAMINOPHEN 5-325 MG PO TABS: 1.0000 | ORAL_TABLET | Freq: Four times a day (QID) | ORAL | Status: DC | PRN

## 2020-11-14 MED ORDER — SODIUM CHLORIDE 0.9 % IV SOLN: Freq: Once | INTRAVENOUS | Status: AC

## 2020-11-14 MED ORDER — MORPHINE SULFATE (PF) 2 MG/ML IV SOLN
0.5000 mg | INTRAVENOUS | Status: DC | PRN
  Administered 2020-11-14: 0.5 mg via INTRAVENOUS
  Filled 2020-11-14: qty 1

## 2020-11-14 MED ORDER — ENOXAPARIN SODIUM 40 MG/0.4ML ~~LOC~~ SOLN
40.0000 mg | SUBCUTANEOUS | Status: DC
  Administered 2020-11-14 – 2020-11-15 (×2): 40 mg via SUBCUTANEOUS
  Filled 2020-11-14 (×2): qty 0.4

## 2020-11-14 NOTE — Consult Note (Signed)
ORTHOPAEDIC CONSULTATION  REQUESTING PHYSICIAN: Jonnie Finner, DO  Chief Complaint: Right Hip pain  HPI: [name] is a 87 y.o. female who complains of right hip pain.  Imaging shows Right hip hemiarthroplasty. Acute minimally displaced periprosthetic fracture in the proximal shaft and metaphysis of the right femur. No hardware fracture. No right hip dislocation.  Orthopedics was consulted for evaluation.    Denies history of MI, CVA, DVT, PE.  Previously ambulatory.  The patient was living independently with her husband.    Past Medical History:  Diagnosis Date  . Constipation   . Diverticulosis   . Hypertension   . Inguinal hernia, left   . Irritable bowel syndrome   . Osteoporosis   . UTI (lower urinary tract infection)    hx of frequent uti's   Past Surgical History:  Procedure Laterality Date  . APPENDECTOMY    . BREAST SURGERY Left    biopsy  . CERVICAL FUSION    . colon polyps    . HEMORRHOID SURGERY    . HIP SURGERY Right   . INGUINAL HERNIA REPAIR Left 01/13/2017   Procedure: LEFT INGUINAL HERNIA REPAIR WITH MESH;  Surgeon: Jackolyn Confer, MD;  Location: [facility];  Service: General;  Laterality: Left;  . INSERTION OF MESH Left 01/13/2017   Procedure: INSERTION OF MESH;  Surgeon: Jackolyn Confer, MD;  Location: [facility];  Service: General;  Laterality: Left;  . LUMBAR FUSION    . LUMBAR LAMINECTOMY    . partial resection of colon for tumor    . SHOULDER SURGERY Left   . TOTAL HIP ARTHROPLASTY     right   Social History   Socioeconomic History  . Marital status: Married    Spouse name: Not on file  . Number of children: Not on file  . Years of education: Not on file  . Highest education level: Not on file  Occupational History  . Occupation: retired    Fish farm manager: RETIRED  Tobacco Use  . Smoking status: Never Smoker  . Smokeless tobacco: Never Used  Vaping Use  . Vaping Use: Never used   Substance and Sexual Activity  . Alcohol use: No  . Drug use: No  . Sexual activity: Not Currently  Other Topics Concern  . 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 Difficulty of Paying Living Expenses: Not hard at all  Food Insecurity: No Food Insecurity  . Worried About Charity fundraiser in the Last Year: Never true  . Ran Out of Food in the Last Year: Never true  Transportation Needs: No Transportation Needs  . Lack of Transportation (Medical): No  . Lack of Transportation (Non-Medical): No  Physical Activity: Sufficiently Active  . Days of Exercise per Week: 7 days  . Minutes of Exercise per Session: 30 min  Stress: No Stress Concern Present  . Feeling of Stress : Not at all  Social Connections: Moderately Integrated  . Frequency of Communication with Friends and Family: More than three times a week  . Frequency of Social Gatherings with Friends and Family: More than three times a week  . Attends Religious Services: More than 4 times per year  . Active Member of Clubs  or Organizations: No  . Attends Archivist Meetings: Never  . Marital Status: Married   Family History  Problem Relation Age of Onset  . Colon cancer Mother   . Cancer Father   . Lung cancer Father   . Ovarian cancer Sister   . Breast cancer Sister   . Liver cancer Brother   . Heart disease Brother    Allergies  Allergen Reactions  . Codeine     REACTION: headaches  . Flagyl [Metronidazole Hcl]     nausea   Prior to Admission medications   Medication Sig Start Date End Date Taking? Authorizing Provider  b complex vitamins tablet Take 1 tablet by mouth daily.   Yes [provider]  bisoprolol-hydrochlorothiazide (ZIAC) 5-6.25 MG tablet Take 1 tablet by mouth daily.   Yes [provider]   Glucosamine-Chondroitin 250-200 MG TABS Take 1 tablet by mouth daily. 11/04/20  Yes Burchette, Alinda Sierras, MD  ibuprofen (ADVIL) 200 MG tablet Take 200 mg by mouth every 6 (six) hours as needed for fever, headache or mild pain.   Yes [provider]  Multiple Minerals-Vitamins (CITRACAL PLUS) TABS Take 1 tablet by mouth daily. 11/28/19  Yes Burchette, Alinda Sierras, MD  Omega-3 Fatty Acids (FISH OIL) 1000 MG CAPS Take 2 capsules (2,000 mg total) by mouth 2 (two) times daily. 11/04/20  Yes Burchette, Alinda Sierras, MD  polyvinyl alcohol (LIQUIFILM TEARS) 1.4 % ophthalmic solution Place 1 drop into both eyes as needed for dry eyes.   Yes [provider]  Vitamin D, Ergocalciferol, (DRISDOL) 1.25 MG (50000 UNIT) CAPS capsule Take 1 capsule (50,000 Units total) by mouth every 7 (seven) days. 11/04/20  Yes Burchette, Alinda Sierras, MD  Calcium-Vitamin D-Vitamin K (VIACTIV CALCIUM PLUS D) 650-12.5-40 MG-MCG-MCG CHEW As directed Patient not taking: Reported on 11/14/2020 11/04/20   Eulas Post, MD   DG Hip Unilat  With Pelvis 2-3 Views Right  Result Date: 11/14/2020 CLINICAL DATA:  Fall with right hip pain EXAM: DG HIP (WITH OR WITHOUT PELVIS) 2-3V RIGHT COMPARISON:  06/12/2008 right hip radiographs FINDINGS: Status post right hip hemiarthroplasty. New periprosthetic fracture in proximal shaft and metaphysis of right femur with minimal 4 mm lateral displacement of the dominant lateral fracture fragment. No dislocation at the right hip. No hardware fracture. No additional bone fracture. No focal osseous lesions. No pelvic diastasis. Degenerative changes in the visualized lower lumbar spine. IMPRESSION: Right hip hemiarthroplasty. Acute minimally displaced periprosthetic fracture in the proximal shaft and metaphysis of the right femur. No hardware fracture. No right hip dislocation. Electronically Signed   By: Ilona Sorrel M.D.   On: 11/14/2020 12:51    Positive ROS: All other systems have been reviewed and were  otherwise negative with the exception of those mentioned in the HPI and as above.  Objective: Labs cbc Recent Labs    11/14/20 1403  WBC 20.0*  HGB 10.1*  HCT 31.0*  PLT 169    Labs inflam No results for input(s): CRP in the last 72 hours.  Invalid input(s): ESR  Labs coag No results for input(s): INR, PTT in the last 72 hours.  Invalid input(s): PT  Recent Labs    11/14/20 1403  NA 135  K 3.6  CL 101  CO2 24  GLUCOSE 136*  BUN 24*  CREATININE 0.76  CALCIUM 8.8*    Physical Exam: Vitals:   11/14/20 1445 11/14/20 1500  BP: 113/65 92/63  Pulse: 70 72  Resp: 16 16  Temp:    SpO2: 100% 100%   General: Alert, no acute distress. Resting in bed. Mental status: Alert and Oriented x3 Neurologic: Speech Clear and organized, no gross focal findings or movement disorder appreciated. Respiratory: No cyanosis, no use of accessory musculature Cardiovascular: No pedal edema GI: Abdomen is soft and non-tender, non-distended. Skin: Warm and dry.  No lesions in the area of chief complaint . Extremities: RLE nvi. Comfortable in current position.  Warm and well perfused w/o edema. NVI.  Psychiatric: Patient is competent for consent with normal mood and affect  MUSCULOSKELETAL:  Other extremities are atraumatic with painless ROM and NVI.  Assessment / Plan: Active Problems:   Hip fracture [facility])     Plan for Operative fixation by Dr. Alvan Dame or similar Orthopedic surgeon with appropriate skill set probably Monday. -May have regular diet for now.  -Medicine team to admit and perform pre-op clearance  -PT/OT post op -Foley okay prn for comfort  -Likely to require Rehab or SNF placement upon discharge.   The risks benefits and alternatives of the procedure were discussed with the patient including but not limited to infection, bleeding, nerve injury, the need for revision surgery, blood clots, cardiopulmonary complications, morbidity, mortality, among others.  The patient  verbalizes understanding and wishes to proceed.     Weightbearing: Bedrest.  Will amend post op. VTE prophylaxis: Lovenox,  SCDs.  Pain control: Norco, Minimize narcotics if able. Follow-up plan: Will follow in acute inpatient post-op phase.  Contact information:  Edmonia Lynch MD, Margy Clarks PA-C  Rachael Fee PA-C 11/14/2020 4:44 PM

## 2020-11-14 NOTE — H&P (Signed)
History and Physical    [name] ZOX:[id number] DOB: 10/12/1932 DOA: 11/14/2020  PCP: Eulas Post, MD  Patient coming from: Home  Chief Complaint: Right hip pain after fall  HPI: [name] is a 87 y.o. female with medical history significant of HTN. Presenting with right hip pain after a fall. She reports that she had a mechanical fall on ice this morning. She had bad placement of her foot and simply slipped on the ice. She denies any head injury or LOC. She crawled 40ft to her garage and called for help. She was transported to the ED. She denies any other aggravating or alleviating factors.    ED Course: XR right hip showed acute minimally displaced periprosthetic fracture in the proximal shaft and metaphysis of the right femur. Ortho was consulted. [facility] was called for admission.   Review of Systems:  Denies CP, palpitations, dyspnea, LOC, syncope, dizziness. Review of systems is otherwise negative for all not mentioned in HPI.   PMHx Past Medical History:  Diagnosis Date  . Constipation   . Diverticulosis   . Hypertension   . Inguinal hernia, left   . Irritable bowel syndrome   . Osteoporosis   . UTI (lower urinary tract infection)    hx of frequent uti's    PSHx Past Surgical History:  Procedure Laterality Date  . APPENDECTOMY    . BREAST SURGERY Left    biopsy  . CERVICAL FUSION    . colon polyps    . HEMORRHOID SURGERY    . HIP SURGERY Right   . INGUINAL HERNIA REPAIR Left 01/13/2017   Procedure: LEFT INGUINAL HERNIA REPAIR WITH MESH;  Surgeon: Jackolyn Confer, MD;  Location: [facility];  Service: General;  Laterality: Left;  . INSERTION OF MESH Left 01/13/2017   Procedure: INSERTION OF MESH;  Surgeon: Jackolyn Confer, MD;  Location: [facility];  Service: General;  Laterality: Left;  . LUMBAR FUSION    . LUMBAR LAMINECTOMY    . partial resection of colon for tumor    . SHOULDER SURGERY Left   . TOTAL HIP  ARTHROPLASTY     right    SocHx  reports that she has never smoked. She has never used smokeless tobacco. She reports that she does not drink alcohol and does not use drugs.  Allergies  Allergen Reactions  . Codeine     REACTION: headaches  . Flagyl [Metronidazole Hcl]     nausea    FamHx Family History  Problem Relation Age of Onset  . Colon cancer Mother   . Cancer Father   . Lung cancer Father   . Ovarian cancer Sister   . Breast cancer Sister   . Liver cancer Brother   . Heart disease Brother     Prior to Admission medications   Medication Sig Start Date End Date Taking? Authorizing Provider  b complex vitamins tablet Take 1 tablet by mouth daily.   Yes [provider]  bisoprolol-hydrochlorothiazide (ZIAC) 5-6.25 MG tablet Take 1 tablet by mouth daily.   Yes [provider]  Glucosamine-Chondroitin 250-200 MG TABS Take 1 tablet by mouth daily. 11/04/20  Yes Burchette, Alinda Sierras, MD  ibuprofen (ADVIL) 200 MG tablet Take 200 mg by mouth every 6 (six) hours as needed for fever, headache or mild pain.   Yes [provider]  Multiple Minerals-Vitamins (CITRACAL PLUS) TABS Take 1 tablet by mouth daily. 11/28/19  Yes Burchette, Alinda Sierras, MD  Omega-3  Fatty Acids (FISH OIL) 1000 MG CAPS Take 2 capsules (2,000 mg total) by mouth 2 (two) times daily. 11/04/20  Yes Burchette, Alinda Sierras, MD  polyvinyl alcohol (LIQUIFILM TEARS) 1.4 % ophthalmic solution Place 1 drop into both eyes as needed for dry eyes.   Yes [provider]  Vitamin D, Ergocalciferol, (DRISDOL) 1.25 MG (50000 UNIT) CAPS capsule Take 1 capsule (50,000 Units total) by mouth every 7 (seven) days. 11/04/20  Yes Burchette, Alinda Sierras, MD  Calcium-Vitamin D-Vitamin K (VIACTIV CALCIUM PLUS D) 650-12.5-40 MG-MCG-MCG CHEW As directed Patient not taking: Reported on 11/14/2020 11/04/20   Eulas Post, MD    Physical Exam: Vitals:   11/14/20 1400 11/14/20 1430 11/14/20 1445 11/14/20 1500  BP:  102/63 116/67 113/65 92/63  Pulse: 72 73 70 72  Resp: 19 16 16 16   Temp:      SpO2: 90% 99% 100% 100%  Weight:      Height:        General: 87 y.o. female resting in bed in NAD Eyes: PERRL, normal sclera ENMT: Nares patent w/o discharge, orophaynx clear, dentition normal, ears w/o discharge/lesions/ulcers Neck: Supple, trachea midline Cardiovascular: RRR, +S1, S2, no m/g/r, equal pulses throughout Respiratory: CTABL, no w/r/r, normal WOB GI: BS+, NDNT, no masses noted, no organomegaly noted MSK: No e/c/c; limit ROM for right hip d/t pain Skin: No rashes, bruises, ulcerations noted Neuro: A&O x 3, no focal deficits Psyc: Appropriate interaction and affect, calm/cooperative  Labs on Admission: I have personally reviewed following labs and imaging studies  CBC: Recent Labs  Lab 11/14/20 1403  WBC 20.0*  NEUTROABS 17.6*  HGB 10.1*  HCT 31.0*  MCV 96.9  PLT 123XX123   Basic Metabolic Panel: Recent Labs  Lab 11/14/20 1403  NA 135  K 3.6  CL 101  CO2 24  GLUCOSE 136*  BUN 24*  CREATININE 0.76  CALCIUM 8.8*   GFR: Estimated Creatinine Clearance: 43 mL/min (by C-G formula based on SCr of 0.76 mg/dL). Liver Function Tests: No results for input(s): AST, ALT, ALKPHOS, BILITOT, PROT, ALBUMIN in the last 168 hours. No results for input(s): LIPASE, AMYLASE in the last 168 hours. No results for input(s): AMMONIA in the last 168 hours. Coagulation Profile: No results for input(s): INR, PROTIME in the last 168 hours. Cardiac Enzymes: No results for input(s): CKTOTAL, CKMB, CKMBINDEX, TROPONINI in the last 168 hours. BNP (last 3 results) No results for input(s): PROBNP in the last 8760 hours. HbA1C: No results for input(s): HGBA1C in the last 72 hours. CBG: No results for input(s): GLUCAP in the last 168 hours. Lipid Profile: No results for input(s): CHOL, HDL, LDLCALC, TRIG, CHOLHDL, LDLDIRECT in the last 72 hours. Thyroid Function Tests: No results for input(s): TSH,  T4TOTAL, FREET4, T3FREE, THYROIDAB in the last 72 hours. Anemia Panel: No results for input(s): VITAMINB12, FOLATE, FERRITIN, TIBC, IRON, RETICCTPCT in the last 72 hours. Urine analysis:    Component Value Date/Time   COLORURINE yellow 12/02/2009 1439   APPEARANCEUR Clear 12/02/2009 1439   LABSPEC 1.015 12/02/2009 1439   PHURINE 6.5 12/02/2009 1439   HGBUR negative 12/02/2009 1439   BILIRUBINUR negative 08/05/2020 1542   BILIRUBINUR NEG 08/14/2018 1129   KETONESUR negative 08/05/2020 1542   PROTEINUR negative 08/05/2020 1542   PROTEINUR Positive (A) 08/14/2018 1129   UROBILINOGEN 0.2 08/05/2020 1542   UROBILINOGEN 0.2 12/02/2009 1439   NITRITE Negative 08/05/2020 1542   NITRITE NEG 08/14/2018 1129   NITRITE negative 12/02/2009 1439   LEUKOCYTESUR  Large (3+) (A) 08/05/2020 1542    Radiological Exams on Admission: DG Hip Unilat  With Pelvis 2-3 Views Right  Result Date: 11/14/2020 CLINICAL DATA:  Fall with right hip pain EXAM: DG HIP (WITH OR WITHOUT PELVIS) 2-3V RIGHT COMPARISON:  06/12/2008 right hip radiographs FINDINGS: Status post right hip hemiarthroplasty. New periprosthetic fracture in proximal shaft and metaphysis of right femur with minimal 4 mm lateral displacement of the dominant lateral fracture fragment. No dislocation at the right hip. No hardware fracture. No additional bone fracture. No focal osseous lesions. No pelvic diastasis. Degenerative changes in the visualized lower lumbar spine. IMPRESSION: Right hip hemiarthroplasty. Acute minimally displaced periprosthetic fracture in the proximal shaft and metaphysis of the right femur. No hardware fracture. No right hip dislocation. Electronically Signed   By: Ilona Sorrel M.D.   On: 11/14/2020 12:51   Assessment/Plan Acute minimally displaced periprosthetic fracture in the proximal shaft and metaphysis of the right femur     - admit to inpt, med-surg     - Orthopedics consulted ([facility]), will see pt.     -  pain control, lovenox, NPO pMN for possible AM procedure     - EKG ordered  HTN     - resume home meds  Leukocytosis     - likely reactive, but will check UA  Normocytic anemia     - no evidence of bleed     - check iron panel  DVT prophylaxis: lovenox  Code Status: DNR, confirmed at bedside Family Communication: w/ husband at bedside.  Consults called: EDP spoke with orthopedics   Status is: Inpatient  Remains inpatient appropriate because:Inpatient level of care appropriate due to severity of illness   Dispo: The patient is from: Home              Anticipated d/c is to: Home              Anticipated d/c date is: 2 days              Patient currently is not medically stable to d/c.   Difficult to place patient No  Jonnie Finner DO [facility]  If 7PM-7AM, please contact night-coverage [email or web address]  11/14/2020, 3:46 PM
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
## 2020-11-14 NOTE — ED Triage Notes (Signed)
Patient reports to the ER for right hip and femur pain from a mechanical trip and fall. Patient denies neck pain, back pain or LOC. Patient  has a hx of right hip fracture.  140mcg of fentanyl given  154/84 BP CBG 141 96% RA

## 2020-11-14 NOTE — Progress Notes (Signed)
Orthopedic Tech Progress Note Patient Details:  [name] 04-30-1932 [id number]  Ortho Devices Ortho Device/Splint Location: overhead frame Ortho Device/Splint Interventions: Ordered,Application   Post Interventions Patient Tolerated: Well Instructions Provided: Care of device   Braulio Bosch 11/14/2020, 5:39 PM

## 2020-11-15 ENCOUNTER — Inpatient Hospital Stay (HOSPITAL_COMMUNITY): Payer: Medicare Other

## 2020-11-15 DIAGNOSIS — M978XXA Periprosthetic fracture around other internal prosthetic joint, initial encounter: Secondary | ICD-10-CM

## 2020-11-15 DIAGNOSIS — Z96649 Presence of unspecified artificial hip joint: Secondary | ICD-10-CM

## 2020-11-15 LAB — HEPATIC FUNCTION PANEL
ALT: 11 U/L (ref 0–44)
AST: 21 U/L (ref 15–41)
Albumin: 3 g/dL — ABNORMAL LOW (ref 3.5–5.0)
Alkaline Phosphatase: 35 U/L — ABNORMAL LOW (ref 38–126)
Bilirubin, Direct: 0.1 mg/dL (ref 0.0–0.2)
Indirect Bilirubin: 0.7 mg/dL (ref 0.3–0.9)
Total Bilirubin: 0.8 mg/dL (ref 0.3–1.2)
Total Protein: 5.2 g/dL — ABNORMAL LOW (ref 6.5–8.1)

## 2020-11-15 LAB — CBC WITH DIFFERENTIAL/PLATELET
Abs Immature Granulocytes: 0.02 10*3/uL (ref 0.00–0.07)
Basophils Absolute: 0 10*3/uL (ref 0.0–0.1)
Basophils Relative: 1 %
Eosinophils Absolute: 0 10*3/uL (ref 0.0–0.5)
Eosinophils Relative: 0 %
HCT: 21.9 % — ABNORMAL LOW (ref 36.0–46.0)
Hemoglobin: 7.1 g/dL — ABNORMAL LOW (ref 12.0–15.0)
Immature Granulocytes: 0 %
Lymphocytes Relative: 25 %
Lymphs Abs: 2.1 10*3/uL (ref 0.7–4.0)
MCH: 32 pg (ref 26.0–34.0)
MCHC: 32.4 g/dL (ref 30.0–36.0)
MCV: 98.6 fL (ref 80.0–100.0)
Monocytes Absolute: 0.9 10*3/uL (ref 0.1–1.0)
Monocytes Relative: 10 %
Neutro Abs: 5.5 10*3/uL (ref 1.7–7.7)
Neutrophils Relative %: 64 %
Platelets: 123 10*3/uL — ABNORMAL LOW (ref 150–400)
RBC: 2.22 MIL/uL — ABNORMAL LOW (ref 3.87–5.11)
RDW: 11.9 % (ref 11.5–15.5)
WBC: 8.6 10*3/uL (ref 4.0–10.5)
nRBC: 0 % (ref 0.0–0.2)

## 2020-11-15 LAB — BASIC METABOLIC PANEL
Anion gap: 9 (ref 5–15)
BUN: 28 mg/dL — ABNORMAL HIGH (ref 8–23)
CO2: 23 mmol/L (ref 22–32)
Calcium: 7.9 mg/dL — ABNORMAL LOW (ref 8.9–10.3)
Chloride: 102 mmol/L (ref 98–111)
Creatinine, Ser: 1.05 mg/dL — ABNORMAL HIGH (ref 0.44–1.00)
GFR, Estimated: 52 mL/min — ABNORMAL LOW (ref >60)
Glucose, Bld: 119 mg/dL — ABNORMAL HIGH (ref 70–99)
Potassium: 4.2 mmol/L (ref 3.5–5.1)
Sodium: 134 mmol/L — ABNORMAL LOW (ref 135–145)

## 2020-11-15 LAB — CBC
HCT: 22.5 % — ABNORMAL LOW (ref 36.0–46.0)
Hemoglobin: 7.3 g/dL — ABNORMAL LOW (ref 12.0–15.0)
MCH: 31.7 pg (ref 26.0–34.0)
MCHC: 32.4 g/dL (ref 30.0–36.0)
MCV: 97.8 fL (ref 80.0–100.0)
Platelets: 126 10*3/uL — ABNORMAL LOW (ref 150–400)
RBC: 2.3 MIL/uL — ABNORMAL LOW (ref 3.87–5.11)
RDW: 11.9 % (ref 11.5–15.5)
WBC: 8.9 10*3/uL (ref 4.0–10.5)
nRBC: 0 % (ref 0.0–0.2)

## 2020-11-15 LAB — VITAMIN B12: Vitamin B-12: 631 pg/mL (ref 180–914)

## 2020-11-15 LAB — IRON AND TIBC
Iron: 54 ug/dL (ref 28–170)
Saturation Ratios: 21 % (ref 10.4–31.8)
TIBC: 260 ug/dL (ref 250–450)
UIBC: 206 ug/dL

## 2020-11-15 LAB — FOLATE: Folate: 30.8 ng/mL (ref >5.9)

## 2020-11-15 LAB — FERRITIN: Ferritin: 52 ng/mL (ref 11–307)

## 2020-11-15 LAB — RETICULOCYTES
Immature Retic Fract: 6.8 % (ref 2.3–15.9)
RBC.: 2.22 MIL/uL — ABNORMAL LOW (ref 3.87–5.11)
Retic Count, Absolute: 34.6 10*3/uL (ref 19.0–186.0)
Retic Ct Pct: 1.6 % (ref 0.4–3.1)

## 2020-11-15 LAB — PROTIME-INR
INR: 1.2 (ref 0.8–1.2)
Prothrombin Time: 14.5 seconds (ref 11.4–15.2)

## 2020-11-15 LAB — ABO/RH: ABO/RH(D): A POS

## 2020-11-15 MED ORDER — BISOPROLOL FUMARATE 5 MG PO TABS
5.0000 mg | ORAL_TABLET | Freq: Every day | ORAL | Status: DC
  Administered 2020-11-15: 09:00:00 5 mg via ORAL
  Filled 2020-11-15 (×2): qty 1

## 2020-11-15 MED ORDER — ADULT MULTIVITAMIN W/MINERALS CH
1.0000 | ORAL_TABLET | Freq: Every day | ORAL | Status: DC
  Administered 2020-11-16 – 2020-11-22 (×7): 1 via ORAL
  Filled 2020-11-15 (×7): qty 1

## 2020-11-15 MED ORDER — SODIUM CHLORIDE 0.9 % IV SOLN: INTRAVENOUS | Status: DC

## 2020-11-15 MED ORDER — ENSURE ENLIVE PO LIQD
237.0000 mL | Freq: Two times a day (BID) | ORAL | Status: DC
  Administered 2020-11-17 – 2020-11-22 (×9): 237 mL via ORAL

## 2020-11-15 MED ORDER — CHLORHEXIDINE GLUCONATE CLOTH 2 % EX PADS
6.0000 | MEDICATED_PAD | Freq: Every day | CUTANEOUS | Status: DC
  Administered 2020-11-17: 12:00:00 6 via TOPICAL

## 2020-11-15 NOTE — Progress Notes (Signed)
Pt only voided 25cc since 1900. Bladder scan only showed 187cc. Hospitalist on call notified. See Mar for orders given. Will continue to monitor pts output.

## 2020-11-15 NOTE — Progress Notes (Signed)
\      Subjective: Patient reports pain as moderate.  Tolerating diet.  Urinating.  +Flatus.  No CP, SOB.   Pt. On bedrest until post-op  Objective:   VITALS:   Vitals:   11/14/20 2044 11/15/20 0049 11/15/20 0518 11/15/20 0913  BP: 100/68 (!) 97/57 104/62 116/66  Pulse: 87 88 83 91  Resp: 17 17 16    Temp: 97.8 F (36.6 C) 98.1 F (36.7 C) 98.2 F (36.8 C)   TempSrc: Oral Oral Oral   SpO2: 96% 96% 96%   Weight:      Height:       CBC Latest Ref Rng & Units 11/15/2020 11/15/2020 11/14/2020  WBC 4.0 - 10.5 K/uL 8.6 8.9 20.0(H)  Hemoglobin 12.0 - 15.0 g/dL 7.1(L) 7.3(L) 10.1(L)  Hematocrit 36.0 - 46.0 % 21.9(L) 22.5(L) 31.0(L)  Platelets 150 - 400 K/uL 123(L) 126(L) 169   BMP Latest Ref Rng & Units 11/15/2020 11/14/2020 08/05/2020  Glucose 70 - 99 mg/dL 119(H) 136(H) 89  BUN 8 - 23 mg/dL 28(H) 24(H) 21  Creatinine 0.44 - 1.00 mg/dL 1.05(H) 0.76 0.77  BUN/Creat Ratio 6 - 22 (calc) - - NOT APPLICABLE  Sodium 616 - 145 mmol/L 134(L) 135 133(L)  Potassium 3.5 - 5.1 mmol/L 4.2 3.6 4.0  Chloride 98 - 111 mmol/L 102 101 97(L)  CO2 22 - 32 mmol/L 23 24 27   Calcium 8.9 - 10.3 mg/dL 7.9(L) 8.8(L) 9.8   Intake/Output      01/21 0701 01/22 0700 01/22 0701 01/23 0700   P.O. 240    I.V. (mL/kg) 192.6 (3.6)    Total Intake(mL/kg) 432.6 (8)    Urine (mL/kg/hr) 25    Stool 0    Total Output 25    Net +407.6         Stool Occurrence 0 x       Physical Exam: General: NAD.  Resting in bed Resp: No increased wob Cardio: regular rate and rhythm ABD soft Neurologically intact MSK Neurovascularly intact Sensation intact distally Intact pulses distally Dorsiflexion/Plantar flexion intact   Assessment:    Right periprosthetic fracture of the right hip.  Active Problems:   Hip fracture ([facility])    Plan:  Incentive Spirometry Elevate and Apply ice  Weightbearing: NWB RLE  Orthopedic device(s): None Showering: Keep dressing dry VTE prophylaxis: lovenox, SCDs  Will plan  to hold lovenox Monday morning (11/17/20) for anticipated surgery. Pain control: oxycodone, morphine IV Contact information:  Edmonia Lynch MD, Margy Clarks PA-C  Dispo: Skilled Nursing Facility/Rehab     Rachael Fee, Hershal Coria 11/15/2020, 1:18 PM

## 2020-11-15 NOTE — Progress Notes (Addendum)
Initial Nutrition Assessment  DOCUMENTATION CODES:   Not applicable  INTERVENTION:   Ensure Enlive po BID, each supplement provides 350 kcal and 20 grams of protein  MVI daily   Liberalize diet   NUTRITION DIAGNOSIS:   Increased nutrient needs related to hip fracture as evidenced by estimated needs.  GOAL:   Patient will meet greater than or equal to 90% of their needs  MONITOR:   PO intake,Supplement acceptance,Labs,Weight trends,Skin,I & O's  REASON FOR ASSESSMENT:   Consult Hip fracture protocol  ASSESSMENT:   87 y.o. female with medical history significant of HTN and IBS who present with right hip fracture after a fall.  RD working remotely.  Unable to reach pt by phone. Pt with increased estimated needs r/t hip fracture. RD will add supplements and MVI to help pt meet her estimated needs. Pt is at high risk for malnutrition. RD will obtain nutrition related history and exam at follow-up. Per chart, pt appears weight stable at baseline. Plan is for surgical repair on 1/24.  Medications reviewed and include: colace, lovenox, lovaza, vitamin D  Labs reviewed: Na 134(L), BUN 28(H), creat 1.05(H) Hgb 7.1(L), Hct 21.9(L)  NUTRITION - FOCUSED PHYSICAL EXAM: Unable to perform at this time   Diet Order:   Diet Order            Diet 2 gram sodium Room service appropriate? Yes; Fluid consistency: Thin  Diet effective now                EDUCATION NEEDS:   Not appropriate for education at this time  Skin:  Skin Assessment: Reviewed RN Assessment  Last BM:  1/21  Height:   Ht Readings from Last 1 Encounters:  11/14/20 5\' 6"  (1.676 m)    Weight:   Wt Readings from Last 1 Encounters:  11/14/20 54 kg    Ideal Body Weight:  59 kg  BMI:  Body mass index is 19.21 kg/m.  Estimated Nutritional Needs:   Kcal:  1400-1600kcal/day  Protein:  70-80g/day  Fluid:  1.4-1.6L/day  Koleen Distance MS, RD, LDN Please refer to [facility] for RD and/or RD  on-call/weekend/after hours pager

## 2020-11-15 NOTE — Progress Notes (Signed)
[facility] Progress Note  Patient: [name]    YYT:[id number]  DOA: 11/14/2020     Date of Service: the patient was seen and examined on 11/15/2020  Brief hospital course: Past medical history of HTN, osteoporosis.  Presents with a mechanical fall on ice with injury to right hip resulting in periprosthetic proximal right femur fracture. Currently plan is monitor for orthopedic recommendation, currently n.p.o.  Assessment and Plan: 1.  Acute minimally displaced periprosthetic fracture in proximal shaft of right femur Orthopedic consulted. Operative repair on Monday. Pain control. Nonweightbearing right now until cleared by orthopedics. Lovenox and SCD for DVT prophylaxis.  2. Preoperative medical evaluation A) Cardiac risk: Based on RCRI   With this the patient is a low risk for adverse Cardiac outcome from surgery. Be watchful of hydration since the pt has history of CHF. Monitor Ins and Out.  3.  Essential hypertension Blood pressure stable.  Continue home bisoprolol. Hold HCTZ.  4.  Acute kidney injury Baseline serum creatinine around 0.7, currently 1.05. Likely from poor p.o. intake. We will continue with IV fluids  5.  Acute anemia Patient appears to have chronic anemia which may have been unmasked by hemodilution. Chronic anemia with hemoglobin of 10 on admission.  Currently hemoglobin 7.3. All 3 cell lines are down significantly. Repeat hemoglobin stable.  Folic acid normal.  E75 relatively normal.  Iron level relatively normal.  Reticulocyte count normal.  INR normal. Monitor H&H. Transfuse for hemoglobin less than 7 or hemodynamic instability.  6. osteoporosis Vitamin D deficiency Continue calcium and vitamin D supplementation.  7. urinary retention. Foley catheter insertion. Remove postoperatively day 1.  Diet: Regular diet DVT Prophylaxis:   enoxaparin (LOVENOX) injection 40 mg Start: 11/14/20 1800 SCDs Start: 11/14/20 1658  Advance  goals of care discussion: Full code. Patient wants to remain full code perioperatively as well as preoperatively.  Family Communication: no family was present at bedside, at the time of interview.   Disposition:  Status is: Inpatient  Remains inpatient appropriate because:Ongoing diagnostic testing needed not appropriate for outpatient work up   Dispo: The patient is from: Home              Anticipated d/c is to: SNF              Anticipated d/c date is: 3 days              Patient currently is not medically stable to d/c.   Difficult to place patient No   Subjective: Pain still present.  No nausea or vomiting.  Has difficulty urinating secondary to pain.  And had retention twice requiring Foley catheter placement.  Physical Exam:  General: Appear in mild distress, no Rash; Oral Mucosa Clear, moist. no Abnormal Neck Mass Or lumps, Conjunctiva normal  Cardiovascular: S1 and S2 Present, no  Murmur, Respiratory: good respiratory effort, Bilateral Air entry present and CTA, no Crackles, no wheezes Abdomen: Bowel Sound present, Soft and no tenderness Extremities: no Pedal edema Neurology: alert and oriented to time, place, and person affect appropriate. no new focal deficit Gait not checked due to patient safety concerns  Vitals:   11/14/20 1947 11/14/20 2044 11/15/20 0049 11/15/20 0518  BP: 102/65 100/68 (!) 97/57 104/62  Pulse: 91 87 88 83  Resp: 17 17 17 16   Temp: 97.9 F (36.6 C) 97.8 F (36.6 C) 98.1 F (36.7 C) 98.2 F (36.8 C)  TempSrc: Oral Oral Oral Oral  SpO2: 96% 96% 96% 96%  Weight:      Height:        Intake/Output Summary (Last 24 hours) at 11/15/2020 0708 Last data filed at 11/15/2020 0600 Gross per 24 hour  Intake 432.59 ml  Output 25 ml  Net 407.59 ml   Filed Weights   11/14/20 1215  Weight: 54 kg    Data Reviewed: I have personally reviewed and interpreted daily labs, tele strips, imaging. I reviewed all nursing notes, pharmacy notes, vitals,  pertinent old records I have discussed plan of care as described above with RN and patient/family.  CBC: Recent Labs  Lab 11/14/20 1403 11/15/20 0556  WBC 20.0* 8.9  NEUTROABS 17.6*  --   HGB 10.1* 7.3*  HCT 31.0* 22.5*  MCV 96.9 97.8  PLT 169 583*   Basic Metabolic Panel: Recent Labs  Lab 11/14/20 1403 11/15/20 0556  NA 135 134*  K 3.6 4.2  CL 101 102  CO2 24 23  GLUCOSE 136* 119*  BUN 24* 28*  CREATININE 0.76 1.05*  CALCIUM 8.8* 7.9*    Studies: DG Hip Unilat  With Pelvis 2-3 Views Right  Result Date: 11/14/2020 CLINICAL DATA:  Fall with right hip pain EXAM: DG HIP (WITH OR WITHOUT PELVIS) 2-3V RIGHT COMPARISON:  06/12/2008 right hip radiographs FINDINGS: Status post right hip hemiarthroplasty. New periprosthetic fracture in proximal shaft and metaphysis of right femur with minimal 4 mm lateral displacement of the dominant lateral fracture fragment. No dislocation at the right hip. No hardware fracture. No additional bone fracture. No focal osseous lesions. No pelvic diastasis. Degenerative changes in the visualized lower lumbar spine. IMPRESSION: Right hip hemiarthroplasty. Acute minimally displaced periprosthetic fracture in the proximal shaft and metaphysis of the right femur. No hardware fracture. No right hip dislocation. Electronically Signed   By: Ilona Sorrel M.D.   On: 11/14/2020 12:51    Scheduled Meds: . bisoprolol-hydrochlorothiazide  1 tablet Oral Daily  . docusate sodium  100 mg Oral BID  . enoxaparin (LOVENOX) injection  40 mg Subcutaneous Q24H  . omega-3 acid ethyl esters  1 g Oral BID  . traMADol  50 mg Oral Q6H  . Vitamin D (Ergocalciferol)  50,000 Units Oral Q7 days   Continuous Infusions: . sodium chloride 100 mL/hr at 11/15/20 0634   PRN Meds: acetaminophen, bisacodyl, diphenhydrAMINE, HYDROcodone-acetaminophen, HYDROcodone-acetaminophen, morphine injection, polyvinyl alcohol, senna-docusate  Time spent: 35 minutes  Author: Berle Mull,  MD [facility] 11/15/2020 7:08 AM  To reach On-call, see care teams to locate the attending and reach out via [email or web address]. Between 7PM-7AM, please contact night-coverage If you still have difficulty reaching the attending provider, please page the [facility] (Director on Call) for [facility] on amion for assistance.

## 2020-11-16 ENCOUNTER — Inpatient Hospital Stay (HOSPITAL_COMMUNITY): Payer: Medicare Other

## 2020-11-16 DIAGNOSIS — M978XXA Periprosthetic fracture around other internal prosthetic joint, initial encounter: Secondary | ICD-10-CM | POA: Diagnosis not present

## 2020-11-16 DIAGNOSIS — Z96649 Presence of unspecified artificial hip joint: Secondary | ICD-10-CM | POA: Diagnosis not present

## 2020-11-16 LAB — BASIC METABOLIC PANEL
Anion gap: 4 — ABNORMAL LOW (ref 5–15)
BUN: 19 mg/dL (ref 8–23)
CO2: 23 mmol/L (ref 22–32)
Calcium: 7.2 mg/dL — ABNORMAL LOW (ref 8.9–10.3)
Chloride: 107 mmol/L (ref 98–111)
Creatinine, Ser: 0.77 mg/dL (ref 0.44–1.00)
GFR, Estimated: >60 mL/min (ref >60)
Glucose, Bld: 106 mg/dL — ABNORMAL HIGH (ref 70–99)
Potassium: 3.9 mmol/L (ref 3.5–5.1)
Sodium: 134 mmol/L — ABNORMAL LOW (ref 135–145)

## 2020-11-16 LAB — CBC
HCT: 17.6 % — ABNORMAL LOW (ref 36.0–46.0)
Hemoglobin: 5.6 g/dL — CL (ref 12.0–15.0)
MCH: 31.8 pg (ref 26.0–34.0)
MCHC: 31.8 g/dL (ref 30.0–36.0)
MCV: 100 fL (ref 80.0–100.0)
Platelets: 90 10*3/uL — ABNORMAL LOW (ref 150–400)
RBC: 1.76 MIL/uL — ABNORMAL LOW (ref 3.87–5.11)
RDW: 11.9 % (ref 11.5–15.5)
WBC: 7.6 10*3/uL (ref 4.0–10.5)
nRBC: 0 % (ref 0.0–0.2)

## 2020-11-16 LAB — PREPARE RBC (CROSSMATCH)

## 2020-11-16 LAB — MAGNESIUM: Magnesium: 2.2 mg/dL (ref 1.7–2.4)

## 2020-11-16 LAB — HEMOGLOBIN AND HEMATOCRIT, BLOOD
HCT: 26.6 % — ABNORMAL LOW (ref 36.0–46.0)
Hemoglobin: 8.9 g/dL — ABNORMAL LOW (ref 12.0–15.0)

## 2020-11-16 MED ORDER — TRANEXAMIC ACID-NACL 1000-0.7 MG/100ML-% IV SOLN
1000.0000 mg | Freq: Once | INTRAVENOUS | Status: AC
  Administered 2020-11-16: 1000 mg via INTRAVENOUS
  Filled 2020-11-16: qty 100

## 2020-11-16 MED ORDER — IOHEXOL 9 MG/ML PO SOLN
500.0000 mL | ORAL | Status: AC
  Administered 2020-11-16 (×2): 500 mL via ORAL

## 2020-11-16 MED ORDER — DEXTROSE IN LACTATED RINGERS 5 % IV SOLN: INTRAVENOUS | Status: DC

## 2020-11-16 MED ORDER — IOHEXOL 300 MG/ML  SOLN
100.0000 mL | Freq: Once | INTRAMUSCULAR | Status: AC | PRN
  Administered 2020-11-16: 15:00:00 100 mL via INTRAVENOUS

## 2020-11-16 MED ORDER — SODIUM CHLORIDE 0.9% IV SOLUTION: Freq: Once | INTRAVENOUS | Status: AC

## 2020-11-16 NOTE — Progress Notes (Signed)
\      Subjective: Patient reports pain as moderate.  Tolerating diet.  Urinating.  +Flatus.  No CP, SOB.   Pt. On bedrest until post-op  Objective:   VITALS:   Vitals:   11/16/20 0143 11/16/20 0659 11/16/20 0956 11/16/20 1039  BP: (!) 96/49 (!) 102/59 122/74 (!) 125/58  Pulse: 89 89 (!) 106 96  Resp: 16 18 16 16   Temp: 97.9 F (36.6 C) 97.7 F (36.5 C) 98.3 F (36.8 C) 98.6 F (37 C)  TempSrc: Oral Oral Oral Oral  SpO2: 100% 96% 98% 95%  Weight:      Height:       CBC Latest Ref Rng & Units 11/16/2020 11/15/2020 11/15/2020  WBC 4.0 - 10.5 K/uL 7.6 8.6 8.9  Hemoglobin 12.0 - 15.0 g/dL 5.6(LL) 7.1(L) 7.3(L)  Hematocrit 36.0 - 46.0 % 17.6(L) 21.9(L) 22.5(L)  Platelets 150 - 400 K/uL 90(L) 123(L) 126(L)   BMP Latest Ref Rng & Units 11/16/2020 11/15/2020 11/14/2020  Glucose 70 - 99 mg/dL 106(H) 119(H) 136(H)  BUN 8 - 23 mg/dL 19 28(H) 24(H)  Creatinine 0.44 - 1.00 mg/dL 0.77 1.05(H) 0.76  BUN/Creat Ratio 6 - 22 (calc) - - -  Sodium 135 - 145 mmol/L 134(L) 134(L) 135  Potassium 3.5 - 5.1 mmol/L 3.9 4.2 3.6  Chloride 98 - 111 mmol/L 107 102 101  CO2 22 - 32 mmol/L 23 23 24   Calcium 8.9 - 10.3 mg/dL 7.2(L) 7.9(L) 8.8(L)   Intake/Output      01/22 0701 01/23 0700 01/23 0701 01/24 0700   P.O. 600 0   I.V. (mL/kg) 1937.6 (35.9)    Blood  315   Total Intake(mL/kg) 2537.6 (47) 315 (5.8)   Urine (mL/kg/hr) 1200 (0.9) 0 (0)   Stool     Total Output 1200 0   Net +1337.6 +315           Physical Exam: General: NAD.  Resting in bed Resp: No increased wob Cardio: regular rate and rhythm ABD soft Neurologically intact MSK Neurovascularly intact Sensation intact distally Intact pulses distally Dorsiflexion/Plantar flexion intact   Assessment:    Right periprosthetic fracture of the right hip.  Active Problems:   Hip fracture (HCC)    Plan:  Incentive Spirometry Elevate and Apply ice  Weightbearing: NWB RLE  Orthopedic device(s): None Showering: Keep  dressing dry VTE prophylaxis: lovenox, SCDs  Will plan to hold lovenox Monday morning (11/17/20) for anticipated surgery. Pain control: oxycodone, morphine IV Contact information:  Edmonia Lynch MD, Margy Clarks PA-C  Dispo: Skilled Nursing Facility/Rehab     Rachael Fee, Hershal Coria 11/16/2020, 11:27 AM

## 2020-11-16 NOTE — Progress Notes (Signed)
[facility] Progress Note  Patient: [name]    PTW:[id number]  DOA: 11/14/2020     Date of Service: the patient was seen and examined on 11/16/2020  Brief hospital course: Past medical history of HTN, osteoporosis.  Presents with a mechanical fall on ice with injury to right hip resulting in periprosthetic proximal right femur fracture. Currently plan is monitor for orthopedic recommendation, currently n.p.o.  Assessment and Plan: 1.  Acute minimally displaced periprosthetic fracture in proximal shaft of right femur Orthopedic consulted.  Family prefers [employer] group. Operative repair on Monday. Pain control. Nonweightbearing right now until cleared by orthopedics. Holding Lovenox  SCD for DVT prophylaxis.  2. Preoperative medical evaluation A) Cardiac risk: Based on RCRI   With this the patient is a low risk for adverse Cardiac outcome from surgery. Be watchful of hydration since the pt has history of CHF. Monitor Ins and Out.  3.  Essential hypertension Blood pressure stable.  Continue home bisoprolol. Hold HCTZ.  4.  Acute kidney injury Baseline serum creatinine around 0.7, currently 1.05. Likely from poor p.o. intake. We will continue with IV fluids  5.  Acute bicytopenia likely from hemodilution Patient appears to have chronic anemia which may have been unmasked by hemodilution. Chronic anemia with hemoglobin of 10 on admission.  Currently hemoglobin 5.6 All 3 cell lines are down significantly. Folic acid normal.  Z00 relatively normal.  Iron level relatively normal. Reticulocyte count normal.  INR normal. Due to significant drop in the hemoglobin CT abdomen as well as CT hip ordered performed to rule out any significant bleeding. No excessive bruising no active bleeding reported. Transfused 2 PRBC. Monitor CBC posttransfusion. Transfuse for hemoglobin less than 7 or hemodynamic instability.  6. osteoporosis Vitamin D deficiency Continue  calcium and vitamin D supplementation.  7. urinary retention. Foley catheter insertion. Remove postoperatively day 1.  Diet: Regular diet DVT Prophylaxis:   SCDs Start: 11/14/20 1658  Advance goals of care discussion: Full code. Patient wants to remain full code perioperatively as well as preoperatively.  Family Communication: no family was present at bedside, at the time of interview.   Disposition:  Status is: Inpatient  Remains inpatient appropriate because:Ongoing diagnostic testing needed not appropriate for outpatient work up   Dispo: The patient is from: Home              Anticipated d/c is to: SNF              Anticipated d/c date is: 3 days              Patient currently is not medically stable to d/c.   Difficult to place patient No   Subjective: Pain still present.  No nausea or vomiting.  No fever no chills.  No BM.  No bleeding anywhere.  Physical Exam:  General: Appear in mild distress, no Rash; Oral Mucosa Clear, moist. no Abnormal Neck Mass Or lumps, Conjunctiva normal  Cardiovascular: S1 and S2 Present, no Murmur, Respiratory: Normal respiratory effort, Bilateral Air entry present and no Crackles, no wheezes Abdomen: Bowel Sound present, Soft and no tenderness Extremities: trace Pedal edema, right thigh edema Neurology: alert and oriented to time, place, and person affect appropriate. no new focal deficit Gait not checked due to patient safety concerns    Vitals:   11/16/20 1039 11/16/20 1310 11/16/20 1332 11/16/20 1352  BP: (!) 125/58 116/62 116/62 123/64  Pulse: 96 85 85 88  Resp: 16 16 16 16   Temp:  98.6 F (37 C) 98.3 F (36.8 C) 98.3 F (36.8 C) (!) 97.2 F (36.2 C)  TempSrc: Oral Oral Oral Oral  SpO2: 95% 96% 96% 95%  Weight:      Height:        Intake/Output Summary (Last 24 hours) at 11/16/2020 1606 Last data filed at 11/16/2020 1400 Gross per 24 hour  Intake 2252.56 ml  Output 1600 ml  Net 652.56 ml   Filed Weights   11/14/20  1215  Weight: 54 kg    Data Reviewed: I have personally reviewed and interpreted daily labs, tele strips, imaging. I reviewed all nursing notes, pharmacy notes, vitals, pertinent old records I have discussed plan of care as described above with RN and patient/family.  CBC: Recent Labs  Lab 11/14/20 1403 11/15/20 0556 11/15/20 0747 11/16/20 0550  WBC 20.0* 8.9 8.6 7.6  NEUTROABS 17.6*  --  5.5  --   HGB 10.1* 7.3* 7.1* 5.6*  HCT 31.0* 22.5* 21.9* 17.6*  MCV 96.9 97.8 98.6 100.0  PLT 169 126* 123* 90*   Basic Metabolic Panel: Recent Labs  Lab 11/14/20 1403 11/15/20 0556 11/16/20 0550  NA 135 134* 134*  K 3.6 4.2 3.9  CL 101 102 107  CO2 24 23 23   GLUCOSE 136* 119* 106*  BUN 24* 28* 19  CREATININE 0.76 1.05* 0.77  CALCIUM 8.8* 7.9* 7.2*  MG  --   --  2.2    Studies: CT ABDOMEN PELVIS W CONTRAST  Result Date: 11/16/2020 CLINICAL DATA:  06/09/2006 EXAM: CT ABDOMEN AND PELVIS WITH CONTRAST TECHNIQUE: Multidetector CT imaging of the abdomen and pelvis was performed using the standard protocol following bolus administration of intravenous contrast. CONTRAST:  128mL OMNIPAQUE IOHEXOL 300 MG/ML  SOLN COMPARISON:  06/09/2006 FINDINGS: Lower chest: Patchy bilateral lower lobe opacities, likely atelectasis. Trace bilateral pleural effusions. Hepatobiliary: Subcentimeter posterior right hepatic cysts. Gallbladder is unremarkable. No intrahepatic or extrahepatic ductal dilatation. Pancreas: Within normal limits. Spleen: Within normal limits. Adrenals/Urinary Tract: Adrenal glands are within normal limits. Subcentimeter left renal cysts. Right kidney is within normal limits. No hydronephrosis. Bladder is decompressed by an indwelling Foley catheter. Stomach/Bowel: Stomach is within normal limits. No evidence of bowel obstruction. Appendix is not discretely visualized. Extensive sigmoid diverticulosis, without evidence of diverticulitis. Vascular/Lymphatic: No evidence of abdominal aortic  aneurysm. Atherosclerotic calcifications of the abdominal aorta and branch vessels. Sent lymph nodes Reproductive: Uterus is within normal limits. Left ovary is within normal limits.  No right adnexal mass. Other: Trace pelvic ascites, simple.  No free air. Musculoskeletal: Degenerative changes of the visualized thoracolumbar spine. Mild lumbar dextroscoliosis. Right hip hemiarthroplasty. Periprosthetic fracture with associated intramuscular fluid/hemorrhage will be described on dedicated right hip CT. Mild body wall edema along the right lateral flank. Old/healed right lateral 6th through 8th rib fractures. IMPRESSION: Right hip hemiarthroplasty. Periprosthetic fracture with associated intramuscular fluid/hemorrhage will be described on dedicated right hip CT. Otherwise, no evidence of traumatic injury to the abdomen/pelvis. Trace bilateral pleural effusions. Patchy bilateral lower lobe opacities, likely atelectasis. Electronically Signed   By: Julian Hy M.D.   On: 11/16/2020 15:28    Scheduled Meds: . Chlorhexidine Gluconate Cloth  6 each Topical Daily  . docusate sodium  100 mg Oral BID  . feeding supplement  237 mL Oral BID BM  . multivitamin with minerals  1 tablet Oral Daily  . omega-3 acid ethyl esters  1 g Oral BID  . Vitamin D (Ergocalciferol)  50,000 Units Oral Q7 days  Continuous Infusions: . dextrose 5% lactated ringers     PRN Meds: acetaminophen, bisacodyl, diphenhydrAMINE, HYDROcodone-acetaminophen, HYDROcodone-acetaminophen, morphine injection, polyvinyl alcohol, senna-docusate  Time spent: 35 minutes  Author: Berle Mull, MD [facility] 11/16/2020 4:06 PM  To reach On-call, see care teams to locate the attending and reach out via [email or web address]. Between 7PM-7AM, please contact night-coverage If you still have difficulty reaching the attending provider, please page the [facility] (Director on Call) for [facility] on amion for assistance.

## 2020-11-17 ENCOUNTER — Inpatient Hospital Stay (HOSPITAL_COMMUNITY): Payer: Medicare Other

## 2020-11-17 ENCOUNTER — Encounter (HOSPITAL_COMMUNITY)
Admission: EM | Disposition: A | Discharge status: Skilled Nursing Facility | Payer: Self-pay | Source: Home / Self Care | Attending: Internal Medicine

## 2020-11-17 ENCOUNTER — Encounter (HOSPITAL_COMMUNITY): Payer: Self-pay | Admitting: Internal Medicine

## 2020-11-17 ENCOUNTER — Inpatient Hospital Stay (HOSPITAL_COMMUNITY): Payer: Medicare Other | Admitting: Anesthesiology

## 2020-11-17 DIAGNOSIS — S72001A Fracture of unspecified part of neck of right femur, initial encounter for closed fracture: Secondary | ICD-10-CM | POA: Diagnosis not present

## 2020-11-17 HISTORY — PX: TOTAL HIP REVISION: SHX763

## 2020-11-17 LAB — BASIC METABOLIC PANEL
Anion gap: 7 (ref 5–15)
BUN: 11 mg/dL (ref 8–23)
CO2: 20 mmol/L — ABNORMAL LOW (ref 22–32)
Calcium: 7.4 mg/dL — ABNORMAL LOW (ref 8.9–10.3)
Chloride: 106 mmol/L (ref 98–111)
Creatinine, Ser: 0.63 mg/dL (ref 0.44–1.00)
GFR, Estimated: >60 mL/min (ref >60)
Glucose, Bld: 117 mg/dL — ABNORMAL HIGH (ref 70–99)
Potassium: 3.8 mmol/L (ref 3.5–5.1)
Sodium: 133 mmol/L — ABNORMAL LOW (ref 135–145)

## 2020-11-17 LAB — CBC
HCT: 26.1 % — ABNORMAL LOW (ref 36.0–46.0)
Hemoglobin: 8.9 g/dL — ABNORMAL LOW (ref 12.0–15.0)
MCH: 31.4 pg (ref 26.0–34.0)
MCHC: 34.1 g/dL (ref 30.0–36.0)
MCV: 92.2 fL (ref 80.0–100.0)
Platelets: 99 10*3/uL — ABNORMAL LOW (ref 150–400)
RBC: 2.83 MIL/uL — ABNORMAL LOW (ref 3.87–5.11)
RDW: 14.6 % (ref 11.5–15.5)
WBC: 9.4 10*3/uL (ref 4.0–10.5)
nRBC: 0 % (ref 0.0–0.2)

## 2020-11-17 LAB — SURGICAL PCR SCREEN
MRSA, PCR: NEGATIVE
Staphylococcus aureus: NEGATIVE

## 2020-11-17 LAB — MAGNESIUM: Magnesium: 2.1 mg/dL (ref 1.7–2.4)

## 2020-11-17 SURGERY — TOTAL HIP REVISION
Anesthesia: General | Site: Hip | Laterality: Right

## 2020-11-17 MED ORDER — 0.9 % SODIUM CHLORIDE (POUR BTL) OPTIME
TOPICAL | Status: DC | PRN
  Administered 2020-11-17: 1000 mL

## 2020-11-17 MED ORDER — FENTANYL CITRATE (PF) 100 MCG/2ML IJ SOLN
INTRAMUSCULAR | Status: AC
  Filled 2020-11-17: qty 2

## 2020-11-17 MED ORDER — MENTHOL 3 MG MT LOZG
1.0000 | LOZENGE | OROMUCOSAL | Status: DC | PRN
  Filled 2020-11-17: qty 9

## 2020-11-17 MED ORDER — LIDOCAINE 2% (20 MG/ML) 5 ML SYRINGE
INTRAMUSCULAR | Status: DC | PRN
  Administered 2020-11-17: 60 mg via INTRAVENOUS

## 2020-11-17 MED ORDER — LACTATED RINGERS IV SOLN: INTRAVENOUS | Status: DC

## 2020-11-17 MED ORDER — DEXAMETHASONE SODIUM PHOSPHATE 10 MG/ML IJ SOLN
INTRAMUSCULAR | Status: DC | PRN
  Administered 2020-11-17: 5 mg via INTRAVENOUS

## 2020-11-17 MED ORDER — KETAMINE HCL 10 MG/ML IJ SOLN
INTRAMUSCULAR | Status: DC | PRN
  Administered 2020-11-17: 20 mg via INTRAVENOUS

## 2020-11-17 MED ORDER — STERILE WATER FOR IRRIGATION IR SOLN
Status: DC | PRN
  Administered 2020-11-17: 2000 mL

## 2020-11-17 MED ORDER — METOCLOPRAMIDE HCL 5 MG/ML IJ SOLN: 5.0000 mg | Freq: Three times a day (TID) | INTRAMUSCULAR | Status: DC | PRN

## 2020-11-17 MED ORDER — ONDANSETRON HCL 4 MG/2ML IJ SOLN
INTRAMUSCULAR | Status: AC
  Filled 2020-11-17: qty 2

## 2020-11-17 MED ORDER — CEFAZOLIN SODIUM-DEXTROSE 1-4 GM/50ML-% IV SOLN
1.0000 g | Freq: Four times a day (QID) | INTRAVENOUS | Status: AC
  Administered 2020-11-17 – 2020-11-18 (×3): 1 g via INTRAVENOUS
  Filled 2020-11-17 (×3): qty 50

## 2020-11-17 MED ORDER — ONDANSETRON HCL 4 MG/2ML IJ SOLN
4.0000 mg | Freq: Once | INTRAMUSCULAR | Status: AC | PRN
  Administered 2020-11-17: 4 mg via INTRAVENOUS

## 2020-11-17 MED ORDER — MEPERIDINE HCL 50 MG/ML IJ SOLN: 6.2500 mg | INTRAMUSCULAR | Status: DC | PRN

## 2020-11-17 MED ORDER — SUGAMMADEX SODIUM 200 MG/2ML IV SOLN
INTRAVENOUS | Status: DC | PRN
  Administered 2020-11-17: 200 mg via INTRAVENOUS

## 2020-11-17 MED ORDER — FENTANYL CITRATE (PF) 100 MCG/2ML IJ SOLN
INTRAMUSCULAR | Status: AC
  Administered 2020-11-17: 50 ug
  Filled 2020-11-17: qty 2

## 2020-11-17 MED ORDER — FERROUS SULFATE 325 (65 FE) MG PO TABS
325.0000 mg | ORAL_TABLET | Freq: Two times a day (BID) | ORAL | Status: DC
  Administered 2020-11-18 – 2020-11-22 (×9): 325 mg via ORAL
  Filled 2020-11-17 (×9): qty 1

## 2020-11-17 MED ORDER — ROCURONIUM BROMIDE 10 MG/ML (PF) SYRINGE
PREFILLED_SYRINGE | INTRAVENOUS | Status: AC
  Filled 2020-11-17: qty 10

## 2020-11-17 MED ORDER — METOCLOPRAMIDE HCL 5 MG PO TABS: 5.0000 mg | ORAL_TABLET | Freq: Three times a day (TID) | ORAL | Status: DC | PRN

## 2020-11-17 MED ORDER — METHOCARBAMOL 500 MG IVPB - SIMPLE MED
INTRAVENOUS | Status: AC
  Administered 2020-11-17: 500 mg via INTRAVENOUS
  Filled 2020-11-17: qty 50

## 2020-11-17 MED ORDER — DOCUSATE SODIUM 100 MG PO CAPS: 100.0000 mg | ORAL_CAPSULE | Freq: Two times a day (BID) | ORAL | Status: DC

## 2020-11-17 MED ORDER — TRANEXAMIC ACID-NACL 1000-0.7 MG/100ML-% IV SOLN
1000.0000 mg | INTRAVENOUS | Status: AC
  Administered 2020-11-17: 1000 mg via INTRAVENOUS
  Filled 2020-11-17: qty 100

## 2020-11-17 MED ORDER — CEFAZOLIN SODIUM-DEXTROSE 2-4 GM/100ML-% IV SOLN
2.0000 g | INTRAVENOUS | Status: AC
  Administered 2020-11-17: 2 g via INTRAVENOUS
  Filled 2020-11-17: qty 100

## 2020-11-17 MED ORDER — HYDROCODONE-ACETAMINOPHEN 5-325 MG PO TABS
1.0000 | ORAL_TABLET | ORAL | Status: DC | PRN
  Administered 2020-11-17 – 2020-11-22 (×11): 2 via ORAL
  Filled 2020-11-17 (×11): qty 2

## 2020-11-17 MED ORDER — DEXAMETHASONE SODIUM PHOSPHATE 10 MG/ML IJ SOLN
INTRAMUSCULAR | Status: AC
  Filled 2020-11-17: qty 1

## 2020-11-17 MED ORDER — FENTANYL CITRATE (PF) 100 MCG/2ML IJ SOLN
INTRAMUSCULAR | Status: DC | PRN
  Administered 2020-11-17: 100 ug via INTRAVENOUS

## 2020-11-17 MED ORDER — HYDROCODONE-ACETAMINOPHEN 7.5-325 MG PO TABS
1.0000 | ORAL_TABLET | ORAL | Status: DC | PRN
  Administered 2020-11-21 – 2020-11-22 (×4): 2 via ORAL
  Filled 2020-11-17 (×4): qty 2

## 2020-11-17 MED ORDER — ACETAMINOPHEN 325 MG PO TABS: 325.0000 mg | ORAL_TABLET | Freq: Four times a day (QID) | ORAL | Status: DC | PRN

## 2020-11-17 MED ORDER — FENTANYL CITRATE (PF) 100 MCG/2ML IJ SOLN
INTRAMUSCULAR | Status: AC
  Administered 2020-11-17: 50 ug via INTRAVENOUS
  Filled 2020-11-17: qty 2

## 2020-11-17 MED ORDER — ONDANSETRON HCL 4 MG PO TABS: 4.0000 mg | ORAL_TABLET | Freq: Four times a day (QID) | ORAL | Status: DC | PRN

## 2020-11-17 MED ORDER — HYDROMORPHONE HCL 1 MG/ML IJ SOLN
INTRAMUSCULAR | Status: AC
  Administered 2020-11-17: 0.25 mg via INTRAVENOUS
  Filled 2020-11-17: qty 1

## 2020-11-17 MED ORDER — LIDOCAINE HCL (PF) 2 % IJ SOLN
INTRAMUSCULAR | Status: AC
  Filled 2020-11-17: qty 5

## 2020-11-17 MED ORDER — CHLORHEXIDINE GLUCONATE 4 % EX LIQD: 60.0000 mL | Freq: Once | CUTANEOUS | Status: DC

## 2020-11-17 MED ORDER — ROCURONIUM BROMIDE 10 MG/ML (PF) SYRINGE
PREFILLED_SYRINGE | INTRAVENOUS | Status: DC | PRN
  Administered 2020-11-17: 20 mg via INTRAVENOUS
  Administered 2020-11-17: 50 mg via INTRAVENOUS

## 2020-11-17 MED ORDER — FENTANYL CITRATE (PF) 100 MCG/2ML IJ SOLN
25.0000 ug | INTRAMUSCULAR | Status: DC | PRN
  Administered 2020-11-17: 50 ug via INTRAVENOUS

## 2020-11-17 MED ORDER — SODIUM CHLORIDE 0.9 % IV SOLN: INTRAVENOUS | Status: DC

## 2020-11-17 MED ORDER — POVIDONE-IODINE 10 % EX SWAB
2.0000 "application " | Freq: Once | CUTANEOUS | Status: AC
  Administered 2020-11-17: 2 via TOPICAL

## 2020-11-17 MED ORDER — PROPOFOL 10 MG/ML IV BOLUS
INTRAVENOUS | Status: DC | PRN
  Administered 2020-11-17: 100 mg via INTRAVENOUS

## 2020-11-17 MED ORDER — ACETAMINOPHEN 10 MG/ML IV SOLN: 1000.0000 mg | Freq: Once | INTRAVENOUS | Status: DC | PRN

## 2020-11-17 MED ORDER — ASPIRIN EC 81 MG PO TBEC
81.0000 mg | DELAYED_RELEASE_TABLET | Freq: Two times a day (BID) | ORAL | Status: DC
  Administered 2020-11-17 – 2020-11-22 (×10): 81 mg via ORAL
  Filled 2020-11-17 (×10): qty 1

## 2020-11-17 MED ORDER — MORPHINE SULFATE (PF) 2 MG/ML IV SOLN
0.5000 mg | INTRAVENOUS | Status: DC | PRN
  Administered 2020-11-19: 23:00:00 1 mg via INTRAVENOUS
  Filled 2020-11-17: qty 1

## 2020-11-17 MED ORDER — TRANEXAMIC ACID-NACL 1000-0.7 MG/100ML-% IV SOLN: 1000.0000 mg | Freq: Once | INTRAVENOUS | Status: DC

## 2020-11-17 MED ORDER — ONDANSETRON HCL 4 MG/2ML IJ SOLN: 4.0000 mg | Freq: Four times a day (QID) | INTRAMUSCULAR | Status: DC | PRN

## 2020-11-17 MED ORDER — LABETALOL HCL 5 MG/ML IV SOLN
INTRAVENOUS | Status: DC | PRN
  Administered 2020-11-17 (×2): 5 mg via INTRAVENOUS

## 2020-11-17 MED ORDER — METHOCARBAMOL 500 MG IVPB - SIMPLE MED
500.0000 mg | Freq: Four times a day (QID) | INTRAVENOUS | Status: DC | PRN
  Filled 2020-11-17: qty 50

## 2020-11-17 MED ORDER — HYDROMORPHONE HCL 1 MG/ML IJ SOLN
0.2500 mg | INTRAMUSCULAR | Status: DC | PRN
  Administered 2020-11-17: 0.25 mg via INTRAVENOUS

## 2020-11-17 MED ORDER — LACTATED RINGERS IV SOLN: INTRAVENOUS | Status: DC | PRN

## 2020-11-17 MED ORDER — PHENOL 1.4 % MT LIQD: 1.0000 | OROMUCOSAL | Status: DC | PRN

## 2020-11-17 MED ORDER — METHOCARBAMOL 500 MG PO TABS
500.0000 mg | ORAL_TABLET | Freq: Four times a day (QID) | ORAL | Status: DC | PRN
  Administered 2020-11-17 – 2020-11-18 (×2): 500 mg via ORAL
  Filled 2020-11-17 (×3): qty 1

## 2020-11-17 SURGICAL SUPPLY — 54 items
BAG DECANTER FOR FLEXI CONT (MISCELLANEOUS) ×2 IMPLANT
BAG ZIPLOCK 12X15 (MISCELLANEOUS) ×2 IMPLANT
BLADE SAW SGTL 18X1.27X75 (BLADE) IMPLANT
BRUSH FEMORAL CANAL (MISCELLANEOUS) IMPLANT
CABLE CERLAGE W/CRIMP 1.8 (Cable) ×1 IMPLANT
CABLE EXT 4H GTR W/4 23X232 (Cable) ×1 IMPLANT
CABLE GTR COCR 1.8X635 (Orthopedic Implant) ×1 IMPLANT
COVER SURGICAL LIGHT HANDLE (MISCELLANEOUS) ×2 IMPLANT
COVER WAND RF STERILE (DRAPES) IMPLANT
DERMABOND ADVANCED (GAUZE/BANDAGES/DRESSINGS) ×1
DERMABOND ADVANCED .7 DNX12 (GAUZE/BANDAGES/DRESSINGS) ×1 IMPLANT
DRAPE INCISE IOBAN 85X60 (DRAPES) ×2 IMPLANT
DRAPE ORTHO SPLIT 77X108 STRL (DRAPES) ×4
DRAPE POUCH INSTRU U-SHP 10X18 (DRAPES) ×2 IMPLANT
DRAPE SURG 17X11 SM STRL (DRAPES) ×2 IMPLANT
DRAPE SURG ORHT 6 SPLT 77X108 (DRAPES) ×2 IMPLANT
DRAPE U-SHAPE 47X51 STRL (DRAPES) ×2 IMPLANT
DRSG AQUACEL AG ADV 3.5X10 (GAUZE/BANDAGES/DRESSINGS) IMPLANT
DRSG AQUACEL AG ADV 3.5X14 (GAUZE/BANDAGES/DRESSINGS) ×1 IMPLANT
DURAPREP 26ML APPLICATOR (WOUND CARE) ×2 IMPLANT
ELECT BLADE TIP CTD 4 INCH (ELECTRODE) ×2 IMPLANT
ELECT REM PT RETURN 15FT ADLT (MISCELLANEOUS) ×2 IMPLANT
FACESHIELD WRAPAROUND (MASK) ×8 IMPLANT
FACESHIELD WRAPAROUND OR TEAM (MASK) ×4 IMPLANT
GLOVE BIOGEL PI IND STRL 8.5 (GLOVE) ×1 IMPLANT
GLOVE BIOGEL PI INDICATOR 8.5 (GLOVE) ×1
GLOVE ECLIPSE 8.0 STRL XLNG CF (GLOVE) ×4 IMPLANT
GLOVE SURG ENC MOIS LTX SZ6 (GLOVE) ×4 IMPLANT
GLOVE SURG UNDER LTX SZ6.5 (GLOVE) ×2 IMPLANT
GLOVE SURG UNDER POLY LF SZ7.5 (GLOVE) ×2 IMPLANT
GOWN STRL REUS W/TWL 2XL LVL3 (GOWN DISPOSABLE) ×2 IMPLANT
GOWN STRL REUS W/TWL LRG LVL3 (GOWN DISPOSABLE) ×4 IMPLANT
HANDPIECE INTERPULSE COAX TIP (DISPOSABLE) ×2
KIT BASIN OR (CUSTOM PROCEDURE TRAY) ×2 IMPLANT
KIT TURNOVER KIT A (KITS) ×1 IMPLANT
MANIFOLD NEPTUNE II (INSTRUMENTS) ×2 IMPLANT
NDL SAFETY ECLIPSE 18X1.5 (NEEDLE) ×1 IMPLANT
NEEDLE HYPO 18GX1.5 SHARP (NEEDLE)
NS IRRIG 1000ML POUR BTL (IV SOLUTION) ×2 IMPLANT
PACK TOTAL JOINT (CUSTOM PROCEDURE TRAY) ×2 IMPLANT
PENCIL SMOKE EVACUATOR (MISCELLANEOUS) ×2 IMPLANT
PROTECTOR NERVE ULNAR (MISCELLANEOUS) ×2 IMPLANT
SET HNDPC FAN SPRY TIP SCT (DISPOSABLE) ×1 IMPLANT
SPONGE LAP 18X18 RF (DISPOSABLE) IMPLANT
STAPLER VISISTAT 35W (STAPLE) IMPLANT
SUCTION FRAZIER HANDLE 12FR (TUBING) ×2
SUCTION TUBE FRAZIER 12FR DISP (TUBING) ×1 IMPLANT
SUT STRATAFIX PDS+ 0 24IN (SUTURE) ×2 IMPLANT
SUT VIC AB 1 CT1 36 (SUTURE) ×2 IMPLANT
SUT VIC AB 2-0 CT1 27 (SUTURE) ×4
SUT VIC AB 2-0 CT1 TAPERPNT 27 (SUTURE) ×2 IMPLANT
TOWEL OR 17X26 10 PK STRL BLUE (TOWEL DISPOSABLE) ×4 IMPLANT
TRAY FOLEY MTR SLVR 16FR STAT (SET/KITS/TRAYS/PACK) ×1 IMPLANT
WATER STERILE IRR 1000ML POUR (IV SOLUTION) ×4 IMPLANT

## 2020-11-17 NOTE — Transfer of Care (Signed)
Immediate Anesthesia Transfer of Care Note  Patient: [name]  Procedure(s) Performed: ORIF of Right periprosthetic fracture (Right Hip)  Patient Location: PACU  Anesthesia Type:General  Level of Consciousness: drowsy  Airway & Oxygen Therapy: Patient Spontanous Breathing and Patient connected to face mask oxygen  Post-op Assessment: Report given to RN and Post -op Vital signs reviewed and stable  Post vital signs: Reviewed and stable  Last Vitals:  Vitals Value Taken Time  BP 153/59 11/17/20 1801  Temp    Pulse 74 11/17/20 1805  Resp 20 11/17/20 1805  SpO2 100 % 11/17/20 1805  Vitals shown include unvalidated device data.  Last Pain:  Vitals:   11/17/20 1412  TempSrc: Oral  PainSc:       Patients Stated Pain Goal: 2 (64/33/29 5188)  Complications: No complications documented.

## 2020-11-17 NOTE — Care Management Important Message (Signed)
Medicare important message printed for Lucy Hoyle to give to the patient. 

## 2020-11-17 NOTE — Consult Note (Signed)
Reason for Consult: right hip peri-prosthetic femur fracture Referring Physician: Pietro Cassis, MD and Fredonia Highland,  MD  [name] is an 87 y.o. female.  HPI: 87 yo F with a chief complaint of right hip pain.  The patient was walking outside and slipped on the ice and fell onto her right hip.  Denies any other areas of injury denies head injury denies neck pain denies chest pain abdominal pain back pain.  Has pain to both of her knees.  Was unable to stand back up due to the pain and crawled back to the house.  Patient has a history of right hip hemiarthroplasty after fracture some years ago.  No UE complaints B knee pain right greater than left  Lives independently with husband  Past Medical History:  Diagnosis Date  . Constipation   . Diverticulosis   . Hypertension   . Inguinal hernia, left   . Irritable bowel syndrome   . Osteoporosis   . UTI (lower urinary tract infection)    hx of frequent uti's    Past Surgical History:  Procedure Laterality Date  . APPENDECTOMY    . BREAST SURGERY Left    biopsy  . CERVICAL FUSION    . colon polyps    . HEMORRHOID SURGERY    . HIP SURGERY Right   . INGUINAL HERNIA REPAIR Left 01/13/2017   Procedure: LEFT INGUINAL HERNIA REPAIR WITH MESH;  Surgeon: Jackolyn Confer, MD;  Location: [facility];  Service: General;  Laterality: Left;  . INSERTION OF MESH Left 01/13/2017   Procedure: INSERTION OF MESH;  Surgeon: Jackolyn Confer, MD;  Location: [facility];  Service: General;  Laterality: Left;  . LUMBAR FUSION    . LUMBAR LAMINECTOMY    . partial resection of colon for tumor    . SHOULDER SURGERY Left   . TOTAL HIP ARTHROPLASTY     right    Family History  Problem Relation Age of Onset  . Colon cancer Mother   . Cancer Father   . Lung cancer Father   . Ovarian cancer Sister   . Breast cancer Sister   . Liver cancer Brother   . Heart disease Brother     Social History:  reports that she has  never smoked. She has never used smokeless tobacco. She reports that she does not drink alcohol and does not use drugs.  Allergies:  Allergies  Allergen Reactions  . Codeine     REACTION: headaches  . Flagyl [Metronidazole Hcl]     nausea    Medications:  I have reviewed the patient's current medications. Scheduled: . Chlorhexidine Gluconate Cloth  6 each Topical Daily  . docusate sodium  100 mg Oral BID  . feeding supplement  237 mL Oral BID BM  . multivitamin with minerals  1 tablet Oral Daily  . omega-3 acid ethyl esters  1 g Oral BID  . Vitamin D (Ergocalciferol)  50,000 Units Oral Q7 days    Results for orders placed or performed during the hospital encounter of 11/14/20 (from the past 24 hour(s))  Prepare RBC (crossmatch)     Status: None   Collection Time: 11/16/20  7:50 AM  Result Value Ref Range   Order Confirmation      ORDER PROCESSED BY BLOOD BANK Performed at [facility] [address], [place], [place] [address]   Hemoglobin and hematocrit, blood     Status: Abnormal   Collection Time: 11/16/20  7:55 PM  Result Value Ref Range   Hemoglobin 8.9 (L) 12.0 - 15.0 g/dL   HCT 26.6 (L) 36.0 - 76.2 %  Basic metabolic panel     Status: Abnormal   Collection Time: 11/17/20  5:24 AM  Result Value Ref Range   Sodium 133 (L) 135 - 145 mmol/L   Potassium 3.8 3.5 - 5.1 mmol/L   Chloride 106 98 - 111 mmol/L   CO2 20 (L) 22 - 32 mmol/L   Glucose, Bld 117 (H) 70 - 99 mg/dL   BUN 11 8 - 23 mg/dL   Creatinine, Ser 0.63 0.44 - 1.00 mg/dL   Calcium 7.4 (L) 8.9 - 10.3 mg/dL   GFR, Estimated >60 >60 mL/min   Anion gap 7 5 - 15  CBC     Status: Abnormal   Collection Time: 11/17/20  5:24 AM  Result Value Ref Range   WBC 9.4 4.0 - 10.5 K/uL   RBC 2.83 (L) 3.87 - 5.11 MIL/uL   Hemoglobin 8.9 (L) 12.0 - 15.0 g/dL   HCT 26.1 (L) 36.0 - 46.0 %   MCV 92.2 80.0 - 100.0 fL   MCH 31.4 26.0 - 34.0 pg   MCHC 34.1 30.0 - 36.0 g/dL   RDW 14.6 11.5 - 15.5 %    Platelets 99 (L) 150 - 400 K/uL   nRBC 0.0 0.0 - 0.2 %  Magnesium     Status: None   Collection Time: 11/17/20  5:24 AM  Result Value Ref Range   Magnesium 2.1 1.7 - 2.4 mg/dL    X-ray: CLINICAL DATA:  Fall with right hip pain  EXAM: DG HIP (WITH OR WITHOUT PELVIS) 2-3V RIGHT  COMPARISON:  06/12/2008 right hip radiographs  FINDINGS: Status post right hip hemiarthroplasty. New periprosthetic fracture in proximal shaft and metaphysis of right femur with minimal 4 mm lateral displacement of the dominant lateral fracture fragment. No dislocation at the right hip. No hardware fracture. No additional bone fracture. No focal osseous lesions. No pelvic diastasis. Degenerative changes in the visualized lower lumbar spine.  IMPRESSION: Right hip hemiarthroplasty. Acute minimally displaced periprosthetic fracture in the proximal shaft and metaphysis of the right femur. No hardware fracture. No right hip dislocation.   Electronically Signed   By: Ilona Sorrel M.D.  ROS: Review of Systems  Constitutional: Negative for chills and fever.  HENT: Negative for congestion and rhinorrhea.   Eyes: Negative for redness and visual disturbance.  Respiratory: Negative for shortness of breath and wheezing.   Cardiovascular: Negative for chest pain and palpitations.  Gastrointestinal: Negative for nausea and vomiting.  Genitourinary: Negative for dysuria and urgency.  Musculoskeletal: Positive for arthralgias and gait problem. Negative for myalgias.  Skin: Negative for pallor and wound.  Neurological: Negative for dizziness and headaches.  Blood pressure 131/68, pulse 92, temperature 99.5 F (37.5 C), temperature source Oral, resp. rate 17, height 5\' 6"  (1.676 m), weight 54 kg, SpO2 93 %.  Physical Exam: Vitals and nursing note reviewed.  Constitutional:      General: She is not in acute distress.    Appearance: She is well-developed and well-nourished. She is not diaphoretic.  HENT:      Head: Normocephalic and atraumatic.  Eyes:     Extraocular Movements: EOM normal.     Pupils: Pupils are equal, round, and reactive to light.  Cardiovascular:     Rate and Rhythm: Normal rate and regular rhythm.     Heart sounds: No murmur heard. No friction  rub. No gallop.   Pulmonary:     Effort: Pulmonary effort is normal.     Breath sounds: No wheezing or rales.  Abdominal:     General: There is no distension.     Palpations: Abdomen is soft.     Tenderness: There is no abdominal tenderness.  Musculoskeletal:        General: Swelling and tenderness present. No edema.     Cervical back: Normal range of motion and neck supple.     Comments: Tenderness and swelling to the right proximal femur.  Held in a slightly flexed position.  Pulse motor and sensation intact distally.  Bilateral knee abrasions.  No pelvic tenderness to compression.  Palpated from head to toe without any other noted areas of bony tenderness.  No palpable knee effusions Skin:    General: Skin is warm and dry.  Neurological:     Mental Status: She is alert and oriented to person, place, and time.  Psychiatric:        Mood and Affect: Mood and affect normal.        Behavior: Behavior normal.   Assessment/Plan: 1. Right periprosthetic proximal femur fracture with what appears to be a stable fixed component within proximal third of femur [place] Levo B)  Plan: To OR today for ORIF verus ORID and revision of her femoral stem  NPO Consent ordered after reviewing plan/indications Post op plan discussed  Mauri Pole 11/17/2020, 7:35 AM

## 2020-11-17 NOTE — Anesthesia Procedure Notes (Signed)
Procedure Name: Intubation Date/Time: 11/17/2020 4:05 PM Performed by: Sharlette Dense, CRNA Patient Re-evaluated:Patient Re-evaluated prior to induction Oxygen Delivery Method: Circle system utilized Preoxygenation: Pre-oxygenation with 100% oxygen Induction Type: IV induction Ventilation: Mask ventilation without difficulty and Oral airway inserted - appropriate to patient size Laryngoscope Size: Sabra Heck and 2 Grade View: Grade II Tube type: Oral Tube size: 7.0 mm Number of attempts: 1 Airway Equipment and Method: Stylet Placement Confirmation: ETT inserted through vocal cords under direct vision,  positive ETCO2 and breath sounds checked- equal and bilateral Secured at: 21 cm Tube secured with: Tape Dental Injury: Teeth and Oropharynx as per pre-operative assessment

## 2020-11-17 NOTE — Brief Op Note (Signed)
11/14/2020 - 11/17/2020  5:24 PM  PATIENT:  [name]  87 y.o. female  PRE-OPERATIVE DIAGNOSIS:  right comminuted periprosthetic proximal femur fracture with stable femoral prosthesis  POST-OPERATIVE DIAGNOSIS:  right comminuted periprosthetic proximal femur fracture with stable femoral prosthesis  PROCEDURE:  Procedure(s): ORIF of Right periprosthetic fracture (Right)  SURGEON:  Surgeon(s) and Role:    Paralee Cancel, MD - Primary  PHYSICIAN ASSISTANT: Griffith Citron, PA-C  ANESTHESIA:   general  EBL:  450 mL   BLOOD ADMINISTERED:1 unit of PRBC  DRAINS: none   LOCAL MEDICATIONS USED:  NONE  SPECIMEN:  No Specimen  DISPOSITION OF SPECIMEN:  N/A  COUNTS:  YES  TOURNIQUET:  * No tourniquets in log *  DICTATION: .Other Dictation: Dictation Number [id number]  PLAN OF CARE: Admit to inpatient   PATIENT DISPOSITION:  PACU - hemodynamically stable.   Delay start of Pharmacological VTE agent (>24hrs) due to surgical blood loss or risk of bleeding: no

## 2020-11-17 NOTE — H&P (View-Only) (Signed)
Reason for Consult: right hip peri-prosthetic femur fracture Referring Physician: Pietro Cassis, MD and Fredonia Highland,  MD  [name] is an 87 y.o. female.  HPI: 87 yo F with a chief complaint of right hip pain.  The patient was walking outside and slipped on the ice and fell onto her right hip.  Denies any other areas of injury denies head injury denies neck pain denies chest pain abdominal pain back pain.  Has pain to both of her knees.  Was unable to stand back up due to the pain and crawled back to the house.  Patient has a history of right hip hemiarthroplasty after fracture some years ago.  No UE complaints B knee pain right greater than left  Lives independently with husband  Past Medical History:  Diagnosis Date  . Constipation   . Diverticulosis   . Hypertension   . Inguinal hernia, left   . Irritable bowel syndrome   . Osteoporosis   . UTI (lower urinary tract infection)    hx of frequent uti's    Past Surgical History:  Procedure Laterality Date  . APPENDECTOMY    . BREAST SURGERY Left    biopsy  . CERVICAL FUSION    . colon polyps    . HEMORRHOID SURGERY    . HIP SURGERY Right   . INGUINAL HERNIA REPAIR Left 01/13/2017   Procedure: LEFT INGUINAL HERNIA REPAIR WITH MESH;  Surgeon: Jackolyn Confer, MD;  Location: [facility];  Service: General;  Laterality: Left;  . INSERTION OF MESH Left 01/13/2017   Procedure: INSERTION OF MESH;  Surgeon: Jackolyn Confer, MD;  Location: [facility];  Service: General;  Laterality: Left;  . LUMBAR FUSION    . LUMBAR LAMINECTOMY    . partial resection of colon for tumor    . SHOULDER SURGERY Left   . TOTAL HIP ARTHROPLASTY     right    Family History  Problem Relation Age of Onset  . Colon cancer Mother   . Cancer Father   . Lung cancer Father   . Ovarian cancer Sister   . Breast cancer Sister   . Liver cancer Brother   . Heart disease Brother     Social History:  reports that she has  never smoked. She has never used smokeless tobacco. She reports that she does not drink alcohol and does not use drugs.  Allergies:  Allergies  Allergen Reactions  . Codeine     REACTION: headaches  . Flagyl [Metronidazole Hcl]     nausea    Medications:  I have reviewed the patient's current medications. Scheduled: . Chlorhexidine Gluconate Cloth  6 each Topical Daily  . docusate sodium  100 mg Oral BID  . feeding supplement  237 mL Oral BID BM  . multivitamin with minerals  1 tablet Oral Daily  . omega-3 acid ethyl esters  1 g Oral BID  . Vitamin D (Ergocalciferol)  50,000 Units Oral Q7 days    Results for orders placed or performed during the hospital encounter of 11/14/20 (from the past 24 hour(s))  Prepare RBC (crossmatch)     Status: None   Collection Time: 11/16/20  7:50 AM  Result Value Ref Range   Order Confirmation      ORDER PROCESSED BY BLOOD BANK Performed at [facility] [address] [place], [place] [address]   Hemoglobin and hematocrit, blood     Status: Abnormal   Collection Time: 11/16/20  7:55 PM  Result Value Ref Range   Hemoglobin 8.9 (L) 12.0 - 15.0 g/dL   HCT 26.6 (L) 36.0 - 76.2 %  Basic metabolic panel     Status: Abnormal   Collection Time: 11/17/20  5:24 AM  Result Value Ref Range   Sodium 133 (L) 135 - 145 mmol/L   Potassium 3.8 3.5 - 5.1 mmol/L   Chloride 106 98 - 111 mmol/L   CO2 20 (L) 22 - 32 mmol/L   Glucose, Bld 117 (H) 70 - 99 mg/dL   BUN 11 8 - 23 mg/dL   Creatinine, Ser 0.63 0.44 - 1.00 mg/dL   Calcium 7.4 (L) 8.9 - 10.3 mg/dL   GFR, Estimated >60 >60 mL/min   Anion gap 7 5 - 15  CBC     Status: Abnormal   Collection Time: 11/17/20  5:24 AM  Result Value Ref Range   WBC 9.4 4.0 - 10.5 K/uL   RBC 2.83 (L) 3.87 - 5.11 MIL/uL   Hemoglobin 8.9 (L) 12.0 - 15.0 g/dL   HCT 26.1 (L) 36.0 - 46.0 %   MCV 92.2 80.0 - 100.0 fL   MCH 31.4 26.0 - 34.0 pg   MCHC 34.1 30.0 - 36.0 g/dL   RDW 14.6 11.5 - 15.5 %    Platelets 99 (L) 150 - 400 K/uL   nRBC 0.0 0.0 - 0.2 %  Magnesium     Status: None   Collection Time: 11/17/20  5:24 AM  Result Value Ref Range   Magnesium 2.1 1.7 - 2.4 mg/dL    X-ray: CLINICAL DATA:  Fall with right hip pain  EXAM: DG HIP (WITH OR WITHOUT PELVIS) 2-3V RIGHT  COMPARISON:  06/12/2008 right hip radiographs  FINDINGS: Status post right hip hemiarthroplasty. New periprosthetic fracture in proximal shaft and metaphysis of right femur with minimal 4 mm lateral displacement of the dominant lateral fracture fragment. No dislocation at the right hip. No hardware fracture. No additional bone fracture. No focal osseous lesions. No pelvic diastasis. Degenerative changes in the visualized lower lumbar spine.  IMPRESSION: Right hip hemiarthroplasty. Acute minimally displaced periprosthetic fracture in the proximal shaft and metaphysis of the right femur. No hardware fracture. No right hip dislocation.   Electronically Signed   By: Ilona Sorrel M.D.  ROS: Review of Systems  Constitutional: Negative for chills and fever.  HENT: Negative for congestion and rhinorrhea.   Eyes: Negative for redness and visual disturbance.  Respiratory: Negative for shortness of breath and wheezing.   Cardiovascular: Negative for chest pain and palpitations.  Gastrointestinal: Negative for nausea and vomiting.  Genitourinary: Negative for dysuria and urgency.  Musculoskeletal: Positive for arthralgias and gait problem. Negative for myalgias.  Skin: Negative for pallor and wound.  Neurological: Negative for dizziness and headaches.  Blood pressure 131/68, pulse 92, temperature 99.5 F (37.5 C), temperature source Oral, resp. rate 17, height 5\' 6"  (1.676 m), weight 54 kg, SpO2 93 %.  Physical Exam: Vitals and nursing note reviewed.  Constitutional:      General: She is not in acute distress.    Appearance: She is well-developed and well-nourished. She is not diaphoretic.  HENT:      Head: Normocephalic and atraumatic.  Eyes:     Extraocular Movements: EOM normal.     Pupils: Pupils are equal, round, and reactive to light.  Cardiovascular:     Rate and Rhythm: Normal rate and regular rhythm.     Heart sounds: No murmur heard. No friction  rub. No gallop.   Pulmonary:     Effort: Pulmonary effort is normal.     Breath sounds: No wheezing or rales.  Abdominal:     General: There is no distension.     Palpations: Abdomen is soft.     Tenderness: There is no abdominal tenderness.  Musculoskeletal:        General: Swelling and tenderness present. No edema.     Cervical back: Normal range of motion and neck supple.     Comments: Tenderness and swelling to the right proximal femur.  Held in a slightly flexed position.  Pulse motor and sensation intact distally.  Bilateral knee abrasions.  No pelvic tenderness to compression.  Palpated from head to toe without any other noted areas of bony tenderness.  No palpable knee effusions Skin:    General: Skin is warm and dry.  Neurological:     Mental Status: She is alert and oriented to person, place, and time.  Psychiatric:        Mood and Affect: Mood and affect normal.        Behavior: Behavior normal.   Assessment/Plan: 1. Right periprosthetic proximal femur fracture with what appears to be a stable fixed component within proximal third of femur [place] Levo B)  Plan: To OR today for ORIF verus ORID and revision of her femoral stem  NPO Consent ordered after reviewing plan/indications Post op plan discussed  Mauri Pole 11/17/2020, 7:35 AM

## 2020-11-17 NOTE — Interval H&P Note (Signed)
History and Physical Interval Note:  11/17/2020 3:47 PM  [name]  has presented today for surgery, with the diagnosis of right periprosthetic fracture.  The various methods of treatment have been discussed with the patient and family. After consideration of risks, benefits and other options for treatment, the patient has consented to  Procedure(s): Right periprosthetic fracture with plate and cables possible revision form bipolar to posterior total hip (Right) as a surgical intervention.  The patient's history has been reviewed, patient examined, no change in status, stable for surgery.  I have reviewed the patient's chart and labs.  Questions were answered to the patient's satisfaction.     Mauri Pole

## 2020-11-17 NOTE — Progress Notes (Signed)
PROGRESS NOTE  [name]  DOB: 16-Mar-1932  PCP: Eulas Post, MD YQM:[id number]  DOA: 11/14/2020  LOS: 3 days   Chief Complaint  Patient presents with  . Fall    Brief narrative: [name] is a 87 y.o. female with PMH significant for hypertension, osteoporosis. Patient presented to the ED on 1/21 after a mechanical fall on ice. X-ray showed proximal right femur fracture Admitted to hospitalist service Orthopedics consulted. Noted a plan for surgical fixation today.  Subjective: Patient was seen and examined this morning.  Elderly Caucasian female.  Pleasant.  Not in distress.  Pain controlled.  Remains NPO.  Assessment/Plan: Acute minimally displaced periprosthetic fracture in proximal shaft of right femur -Plan for surgical fixation today.   -Pain management and DVT prophylaxis per primary team.    Essential hypertension Blood pressure stable on bisoprolol.  HCTZ on hold.  Acute kidney injury -Improving with IV fluid.  HCTZ on hold.  Continue IV hydration. Recent Labs    08/05/20 1559 11/14/20 1403 11/15/20 0556 11/16/20 0550 11/17/20 0524  BUN 21 24* 28* 19 11  CREATININE 0.77 0.76 1.05* 0.77 0.63   Acute blood anemia -Hemoglobin level low at 7.3 at presentation, dipped down to 5.6. -CT scan with significant amount of hemorrhage at the fracture site. -2 units of PRBCs transfused.  Hemoglobin up at 8.9 this morning.  Continue to monitor. Recent Labs    11/15/20 0556 11/15/20 0747 11/16/20 0550 11/16/20 1955 11/17/20 0524  HGB 7.3* 7.1* 5.6* 8.9* 8.9*  MCV 97.8 98.6 100.0  --  92.2  VITAMINB12  --  631  --   --   --   FOLATE  --  30.8  --   --   --   FERRITIN  --  52  --   --   --   TIBC  --  260  --   --   --   IRON  --  54  --   --   --   RETICCTPCT  --  1.6  --   --   --    Osteoporosis Vitamin D deficiency Continue calcium and vitamin D supplementation.  Acute urinary retention. -Foley catheter insertion. Remove  postoperatively day 1.  Mobility: PT evaluation post procedure Code Status:   Code Status: Full Code  Nutritional status: Body mass index is 19.21 kg/m. Nutrition Problem: Increased nutrient needs Etiology: hip fracture Signs/Symptoms: estimated needs Diet Order            Diet NPO time specified Except for: Sips with Meds  Diet effective now                 DVT prophylaxis: SCDs Start: 11/14/20 1658   Antimicrobials:  None Fluid: D5 at 75 mL/h Consultants: Orthopedics Family Communication:  None at bedside  Status is: Inpatient  Remains inpatient appropriate because: Pending surgical fixation   Dispo: The patient is from: Home              Anticipated d/c is to: Dependent PT evaluation pending              Anticipated d/c date is: 3 days              Patient currently is not medically stable to d/c.   Difficult to place patient No       Infusions:  . dextrose 5% lactated ringers 75 mL/hr at 11/17/20 8891    Scheduled Meds: . Chlorhexidine Gluconate Cloth  6 each Topical Daily  . docusate sodium  100 mg Oral BID  . feeding supplement  237 mL Oral BID BM  . multivitamin with minerals  1 tablet Oral Daily  . omega-3 acid ethyl esters  1 g Oral BID  . Vitamin D (Ergocalciferol)  50,000 Units Oral Q7 days    Antimicrobials: Anti-infectives (From admission, onward)   None      PRN meds: acetaminophen, bisacodyl, diphenhydrAMINE, HYDROcodone-acetaminophen, HYDROcodone-acetaminophen, morphine injection, polyvinyl alcohol, senna-docusate   Objective: Vitals:   11/16/20 2136 11/17/20 0600  BP: 122/72 131/68  Pulse: 88 92  Resp: 16 17  Temp: 98.6 F (37 C) 99.5 F (37.5 C)  SpO2: 94% 93%    Intake/Output Summary (Last 24 hours) at 11/17/2020 1030 Last data filed at 11/17/2020 1000 Gross per 24 hour  Intake 1368.54 ml  Output 3250 ml  Net -1881.46 ml   Filed Weights   11/14/20 1215  Weight: 54 kg   Weight change:  Body mass index is 19.21  kg/m.   Physical Exam: General exam: Pleasant, elderly Caucasian female.  Not in distress.  Pain controlled Skin: No rashes, lesions or ulcers. HEENT: Atraumatic, normocephalic, no obvious bleeding Lungs: Clear to auscultate bilaterally CVS: Regular rate and rhythm, no murmur GI/Abd soft, nontender, nondistended, bowel sound present CNS: Alert, awake, oriented x3 Psychiatry: Mood appropriate Extremities: No pedal edema, no calf tenderness  Data Review: I have personally reviewed the laboratory data and studies available.  Recent Labs  Lab 11/14/20 1403 11/15/20 0556 11/15/20 0747 11/16/20 0550 11/16/20 1955 11/17/20 0524  WBC 20.0* 8.9 8.6 7.6  --  9.4  NEUTROABS 17.6*  --  5.5  --   --   --   HGB 10.1* 7.3* 7.1* 5.6* 8.9* 8.9*  HCT 31.0* 22.5* 21.9* 17.6* 26.6* 26.1*  MCV 96.9 97.8 98.6 100.0  --  92.2  PLT 169 126* 123* 90*  --  99*   Recent Labs  Lab 11/14/20 1403 11/15/20 0556 11/16/20 0550 11/17/20 0524  NA 135 134* 134* 133*  K 3.6 4.2 3.9 3.8  CL 101 102 107 106  CO2 24 23 23  20*  GLUCOSE 136* 119* 106* 117*  BUN 24* 28* 19 11  CREATININE 0.76 1.05* 0.77 0.63  CALCIUM 8.8* 7.9* 7.2* 7.4*  MG  --   --  2.2 2.1    F/u labs ordered  Signed, Terrilee Croak, MD [facility] 11/17/2020

## 2020-11-17 NOTE — Anesthesia Preprocedure Evaluation (Signed)
Anesthesia Evaluation  Patient identified by MRN, date of birth, ID band Patient awake    Reviewed: Allergy & Precautions, NPO status , Patient's Chart, lab work & pertinent test results, reviewed documented beta blocker date and time   Airway Mallampati: I       Dental no notable dental hx.    Pulmonary neg pulmonary ROS,    Pulmonary exam normal        Cardiovascular hypertension, Pt. on home beta blockers and Pt. on medications Normal cardiovascular exam     Neuro/Psych negative neurological ROS  negative psych ROS   GI/Hepatic Neg liver ROS,   Endo/Other  negative endocrine ROS  Renal/GU negative Renal ROS  Female GU complaint     Musculoskeletal   Abdominal Normal abdominal exam  (+)   Peds  Hematology  (+) Blood dyscrasia, anemia ,   Anesthesia Other Findings   Reproductive/Obstetrics                             Anesthesia Physical Anesthesia Plan  ASA: II  Anesthesia Plan: General   Post-op Pain Management:    Induction: Intravenous  PONV Risk Score and Plan: 2 and Ondansetron  Airway Management Planned: Oral ETT  Additional Equipment: None  Intra-op Plan:   Post-operative Plan: Extubation in OR  Informed Consent: I have reviewed the patients History and Physical, chart, labs and discussed the procedure including the risks, benefits and alternatives for the proposed anesthesia with the patient or authorized representative who has indicated his/her understanding and acceptance.     Dental advisory given  Plan Discussed with: CRNA  Anesthesia Plan Comments:         Anesthesia Quick Evaluation

## 2020-11-18 ENCOUNTER — Encounter (HOSPITAL_COMMUNITY): Payer: Self-pay | Admitting: Orthopedic Surgery

## 2020-11-18 DIAGNOSIS — S72001A Fracture of unspecified part of neck of right femur, initial encounter for closed fracture: Secondary | ICD-10-CM | POA: Diagnosis not present

## 2020-11-18 LAB — CBC
HCT: 28 % — ABNORMAL LOW (ref 36.0–46.0)
Hemoglobin: 9.6 g/dL — ABNORMAL LOW (ref 12.0–15.0)
MCH: 30.8 pg (ref 26.0–34.0)
MCHC: 34.3 g/dL (ref 30.0–36.0)
MCV: 89.7 fL (ref 80.0–100.0)
Platelets: 119 10*3/uL — ABNORMAL LOW (ref 150–400)
RBC: 3.12 MIL/uL — ABNORMAL LOW (ref 3.87–5.11)
RDW: 14.5 % (ref 11.5–15.5)
WBC: 10.9 10*3/uL — ABNORMAL HIGH (ref 4.0–10.5)
nRBC: 0 % (ref 0.0–0.2)

## 2020-11-18 LAB — BASIC METABOLIC PANEL
Anion gap: 10 (ref 5–15)
BUN: 12 mg/dL (ref 8–23)
CO2: 20 mmol/L — ABNORMAL LOW (ref 22–32)
Calcium: 7.1 mg/dL — ABNORMAL LOW (ref 8.9–10.3)
Chloride: 103 mmol/L (ref 98–111)
Creatinine, Ser: 0.67 mg/dL (ref 0.44–1.00)
GFR, Estimated: >60 mL/min (ref >60)
Glucose, Bld: 146 mg/dL — ABNORMAL HIGH (ref 70–99)
Potassium: 3.9 mmol/L (ref 3.5–5.1)
Sodium: 133 mmol/L — ABNORMAL LOW (ref 135–145)

## 2020-11-18 NOTE — Progress Notes (Signed)
PROGRESS NOTE  [name]  DOB: 27-Jan-1932  PCP: Eulas Post, MD HEN:[id number]  DOA: 11/14/2020  LOS: 4 days   Chief Complaint  Patient presents with  . Fall    Brief narrative: [name] is a 87 y.o. female with PMH significant for hypertension, osteoporosis. Patient presented to the ED on 1/21 after a mechanical fall on ice. X-ray showed proximal right femur fracture Admitted to hospitalist service Orthopedics consulted. 1/24, patient underwent ORIF of right periprosthetic fracture.  Subjective: Patient was seen and examined this morning.  Elderly Caucasian female.  Pleasant.  Had a lot of pain last night.  Controlled this morning.   Assessment/Plan: Acute minimally displaced periprosthetic fracture in proximal shaft of right femur -1/24, patient underwent ORIF of right periprosthetic fracture. -Pain management with Norco as needed. -Per orthopedics team, DVT prophylaxis with aspirin 81 mg twice daily.  Essential hypertension -Blood pressure stable on bisoprolol.  HCTZ on hold. -Blood pressure was low at 96/53 this morning.  I would continue IV fluid for next 24 hours.  Acute kidney injury -Improving with IV fluid.  HCTZ on hold.  Continue IV hydration. Recent Labs    08/05/20 1559 11/14/20 1403 11/15/20 0556 11/16/20 0550 11/17/20 0524 11/18/20 0511  BUN 21 24* 28* 19 11 12   CREATININE 0.77 0.76 1.05* 0.77 0.63 0.67   Acute blood anemia -Hemoglobin level low at 7.3 at presentation, dipped down to 5.6. -CT scan with significant amount of hemorrhage at the fracture site. -2 units of PRBCs transfused.  Hemoglobin improved and is stable, 9.6 this morning.  Continue to monitor. Recent Labs    11/15/20 0747 11/16/20 0550 11/16/20 1955 11/17/20 0524 11/18/20 0511  HGB 7.1* 5.6* 8.9* 8.9* 9.6*  MCV 98.6 100.0  --  92.2 89.7  VITAMINB12 631  --   --   --   --   FOLATE 30.8  --   --   --   --   FERRITIN 52  --   --   --   --    TIBC 260  --   --   --   --   IRON 54  --   --   --   --   RETICCTPCT 1.6  --   --   --   --    Osteoporosis Vitamin D deficiency Continue calcium and vitamin D supplementation.  Acute urinary retention. -Foley catheter was inserted.  To remove today.  Mobility: PT evaluation pending. Code Status:   Code Status: Full Code  Nutritional status: Body mass index is 19.21 kg/m. Nutrition Problem: Increased nutrient needs Etiology: hip fracture Signs/Symptoms: estimated needs Diet Order            Diet regular Room service appropriate? Yes; Fluid consistency: Thin  Diet effective now                 DVT prophylaxis: SCDs Start: 11/17/20 1941 Place TED hose Start: 11/17/20 1941 SCDs Start: 11/14/20 1658   Antimicrobials:  None Fluid: D5 at 75 mL/h to continue for next 24 hours. Consultants: Orthopedics Family Communication:  None at bedside  Status is: Inpatient  Remains inpatient appropriate because: Postop day 1.  Pending PT eval   Dispo: The patient is from: Home              Anticipated d/c is to: Dependent PT evaluation pending              Anticipated d/c date is: 3  days              Patient currently is not medically stable to d/c.   Difficult to place patient No       Infusions:  . lactated ringers 75 mL/hr at 11/18/20 1054  . methocarbamol (ROBAXIN) IV 500 mg (11/17/20 1811)    Scheduled Meds: . aspirin EC  81 mg Oral BID  . docusate sodium  100 mg Oral BID  . feeding supplement  237 mL Oral BID BM  . ferrous sulfate  325 mg Oral BID WC  . multivitamin with minerals  1 tablet Oral Daily  . omega-3 acid ethyl esters  1 g Oral BID  . Vitamin D (Ergocalciferol)  50,000 Units Oral Q7 days    Antimicrobials: Anti-infectives (From admission, onward)   Start     Dose/Rate Route Frequency Ordered Stop   11/17/20 2200  ceFAZolin (ANCEF) IVPB 1 g/50 mL premix        1 g 100 mL/hr over 30 Minutes Intravenous Every 6 hours 11/17/20 1940 11/18/20 1034    11/17/20 1500  ceFAZolin (ANCEF) IVPB 2g/100 mL premix        2 g 200 mL/hr over 30 Minutes Intravenous On call to O.R. 11/17/20 1146 11/17/20 1625      PRN meds: acetaminophen, bisacodyl, diphenhydrAMINE, HYDROcodone-acetaminophen, HYDROcodone-acetaminophen, menthol-cetylpyridinium **OR** phenol, methocarbamol **OR** methocarbamol (ROBAXIN) IV, metoCLOPramide **OR** metoCLOPramide (REGLAN) injection, morphine injection, ondansetron **OR** ondansetron (ZOFRAN) IV, polyvinyl alcohol, senna-docusate   Objective: Vitals:   11/17/20 2251 11/18/20 0203  BP: 120/65 (!) 96/53  Pulse: 87 88  Resp: 16 16  Temp: 97.7 F (36.5 C) 98.7 F (37.1 C)  SpO2: 96% 97%    Intake/Output Summary (Last 24 hours) at 11/18/2020 1144 Last data filed at 11/18/2020 1100 Gross per 24 hour  Intake 2479.37 ml  Output 2050 ml  Net 429.37 ml   Filed Weights   11/14/20 1215  Weight: 54 kg   Weight change:  Body mass index is 19.21 kg/m.   Physical Exam: General exam: Pleasant, elderly Caucasian female.  Not in distress.  Pain controlled this morning. Skin: No rashes, lesions or ulcers. HEENT: Atraumatic, normocephalic, no obvious bleeding Lungs: Clear to auscultation bilaterally. CVS: Regular rate and rhythm, no murmur GI/Abd soft, nontender, nondistended, bowel sound present CNS: Alert, awake, oriented x3 Psychiatry: Mood appropriate Extremities: No pedal edema, no calf tenderness  Data Review: I have personally reviewed the laboratory data and studies available.  Recent Labs  Lab 11/14/20 1403 11/15/20 0556 11/15/20 0747 11/16/20 0550 11/16/20 1955 11/17/20 0524 11/18/20 0511  WBC 20.0* 8.9 8.6 7.6  --  9.4 10.9*  NEUTROABS 17.6*  --  5.5  --   --   --   --   HGB 10.1* 7.3* 7.1* 5.6* 8.9* 8.9* 9.6*  HCT 31.0* 22.5* 21.9* 17.6* 26.6* 26.1* 28.0*  MCV 96.9 97.8 98.6 100.0  --  92.2 89.7  PLT 169 126* 123* 90*  --  99* 119*   Recent Labs  Lab 11/14/20 1403 11/15/20 0556  11/16/20 0550 11/17/20 0524 11/18/20 0511  NA 135 134* 134* 133* 133*  K 3.6 4.2 3.9 3.8 3.9  CL 101 102 107 106 103  CO2 24 23 23  20* 20*  GLUCOSE 136* 119* 106* 117* 146*  BUN 24* 28* 19 11 12   CREATININE 0.76 1.05* 0.77 0.63 0.67  CALCIUM 8.8* 7.9* 7.2* 7.4* 7.1*  MG  --   --  2.2 2.1  --  F/u labs ordered  Signed, Terrilee Croak, MD [facility] 11/18/2020

## 2020-11-18 NOTE — Evaluation (Signed)
Physical Therapy Evaluation Patient Details Name: [name] MRN: [id number] DOB: 01/13/1932 Today's Date: 11/18/2020   History of Present Illness  [name] is a 87 y.o. female with PMH significant for hypertension, osteoporosis, Right THA.  Patient presented to the ED on 1/21 after a mechanical fall on ice.  X-ray showed proximal right femur fracture  Admitted to hospitalist service  Orthopedics consulted.  1/24, patient underwent ORIF of right periprosthetic fracture.  Clinical Impression  The patient  Tolerated mobilizing to bed edge and ambulated x 8[' using Rw. Patient reports increased pain from 3-6 with mobility, which decreased to 3 after positioned in recliner.  Patient desires to DC home  With spouse assisting with her care.  Pt admitted with above diagnosis.   Pt currently with functional limitations due to the deficits listed below (see PT Problem List). Pt will benefit from skilled PT to increase their independence and safety with mobility to allow discharge to the venue listed below.   .    Follow Up Recommendations Home health PT    Equipment Recommendations  None recommended by PT    Recommendations for Other Services OT consult     Precautions / Restrictions Precautions Precautions: Fall Restrictions Weight Bearing Restrictions: Yes RLE Weight Bearing: Partial weight bearing RLE Partial Weight Bearing Percentage or Pounds: 50      Mobility  Bed Mobility Overal bed mobility: Needs Assistance Bed Mobility: Supine to Sit     Supine to sit: Mod assist     General bed mobility comments: assist with right leg to bed edge.  Mod assist with trunk. Patient able to scoot  to bed edge using UE's with min guard..    Transfers Overall transfer level: Needs assistance Equipment used: Rolling walker (2 wheeled) Transfers: Sit to/from Stand Sit to Stand: Min assist         General transfer comment: cues for hand placement to power up.  Multimodal cues  for right leg and hands to sit down, although patient did not  reach back to recliner.  Ambulation/Gait Ambulation/Gait assistance: Min assist;+2 safety/equipment Gait Distance (Feet): 8 Feet Assistive device: Rolling walker (2 wheeled) Gait Pattern/deviations: Step-to pattern Gait velocity: decr   General Gait Details: cues for posture and position inside Rw and 50% WB. patient did abide by PWB.  Stairs            Wheelchair Mobility    Modified Rankin (Stroke Patients Only)       Balance Overall balance assessment: Needs assistance Sitting-balance support: Bilateral upper extremity supported;Feet supported Sitting balance-Leahy Scale: Fair     Standing balance support: Bilateral upper extremity supported;During functional activity Standing balance-Leahy Scale: Poor Standing balance comment: reliant on RW and UE's                             Pertinent Vitals/Pain Pain Assessment: Faces Pain Score: 5  Pain Location: right thigh Pain Descriptors / Indicators: Aching;Tightness;Discomfort;Grimacing Pain Intervention(s): Monitored during session;Premedicated before session;Ice applied;Repositioned    Home Living Family/patient expects to be discharged to:: Private residence Living Arrangements: Spouse/significant other Available Help at Discharge: Family;Available 24 hours/day Type of Home: House Home Access: Stairs to enter   [employer] of Steps: 1 GARAGE Home Layout: One level Home Equipment: Environmental consultant - 2 wheels;Cane - single point;Bedside commode;Toilet riser      Prior Function Level of Independence: Independent  Hand Dominance   Dominant Hand: Right    Extremity/Trunk Assessment   Upper Extremity Assessment Upper Extremity Assessment: Generalized weakness    Lower Extremity Assessment Lower Extremity Assessment: Generalized weakness;RLE deficits/detail RLE Deficits / Details: assisted with  hip flexion in supine, tolerated 50% WB    Cervical / Trunk Assessment Cervical / Trunk Assessment: Kyphotic  Communication   Communication: No difficulties/? HOH  Cognition Arousal/Alertness: Awake/alert Behavior During Therapy: WFL for tasks assessed/performed Overall Cognitive Status: Within Functional Limits for tasks assessed                                        General Comments      Exercises General Exercises - Lower Extremity Heel Slides: AAROM;Right;5 reps   Assessment/Plan    PT Assessment Patient needs continued PT services  PT Problem List Decreased strength;Decreased knowledge of precautions;Decreased range of motion;Decreased mobility;Decreased knowledge of use of DME;Decreased activity tolerance;Decreased safety awareness       PT Treatment Interventions DME instruction;Gait training;Functional mobility training;Stair training;Therapeutic activities;Therapeutic exercise;Patient/family education    PT Goals (Current goals can be found in the Care Plan section)  Acute Rehab PT Goals Patient Stated Goal: to go home PT Goal Formulation: With patient Time For Goal Achievement: 12/02/20 Potential to Achieve Goals: Good    Frequency Min 5X/week   Barriers to discharge        Co-evaluation               AM-PAC PT "6 Clicks" Mobility  Outcome Measure Help needed turning from your back to your side while in a flat bed without using bedrails?: A Lot Help needed moving from lying on your back to sitting on the side of a flat bed without using bedrails?: A Lot Help needed moving to and from a bed to a chair (including a wheelchair)?: A Lot Help needed standing up from a chair using your arms (e.g., wheelchair or bedside chair)?: A Lot Help needed to walk in hospital room?: A Lot Help needed climbing 3-5 steps with a railing? : A Lot 6 Click Score: 12    End of Session Equipment Utilized During Treatment: Gait belt Activity Tolerance:  Patient tolerated treatment well Patient left: in chair;with call bell/phone within reach;with chair alarm set Nurse Communication: Mobility status PT Visit Diagnosis: Unsteadiness on feet (R26.81);Muscle weakness (generalized) (M62.81);Difficulty in walking, not elsewhere classified (R26.2);Pain Pain - Right/Left: Right Pain - part of body: Leg    Time: 1950-9326 PT Time Calculation (min) (ACUTE ONLY): 29 min   Charges:   PT Evaluation $PT Eval Low Complexity: 1 Low PT Treatments $Gait Training: 8-22 mins        Tresa Endo PT Acute Rehabilitation Services Pager [phone] Office [phone]   Claretha Cooper 11/18/2020, 3:01 PM

## 2020-11-18 NOTE — Progress Notes (Signed)
Patient ID: [name], female   DOB: 01-24-1932, 87 y.o.   MRN: [id number] Subjective: 1 Day Post-Op Procedure(s) (LRB): ORIF of Right periprosthetic fracture (Right)    Patient reports pain as mild while in bed.  No events noted  Objective:   VITALS:   Vitals:   11/17/20 2251 11/18/20 0203  BP: 120/65 (!) 96/53  Pulse: 87 88  Resp: 16 16  Temp: 97.7 F (36.5 C) 98.7 F (37.1 C)  SpO2: 96% 97%    Neurovascular intact Incision: dressing C/D/I - right thigh  LABS Recent Labs    11/16/20 0550 11/16/20 1955 11/17/20 0524 11/18/20 0511  HGB 5.6* 8.9* 8.9* 9.6*  HCT 17.6* 26.6* 26.1* 28.0*  WBC 7.6  --  9.4 10.9*  PLT 90*  --  99* 119*    Recent Labs    11/16/20 0550 11/17/20 0524 11/18/20 0511  NA 134* 133* 133*  K 3.9 3.8 3.9  BUN 19 11 12   CREATININE 0.77 0.63 0.67  GLUCOSE 106* 117* 146*    Recent Labs    11/15/20 0747  INR 1.2     Assessment/Plan: 1 Day Post-Op Procedure(s) (LRB): ORIF of Right periprosthetic fracture (Right)   Advance diet Up with therapy - PWB RLE Disposition pending therapy assessment and recs Norco for pain ASA and SCDs for DVT proph while in hospital RTC in 2 weeks

## 2020-11-18 NOTE — Op Note (Signed)
NAMEDAVIANA, HAYMAKER MEDICAL RECORD [id number] ACCOUNT [id number] DATE OF BIRTH:07-12-1932 FACILITY: [facility] LOCATION: [facility] PHYSICIAN:Artemio Dobie Marian Sorrow, MD  OPERATIVE REPORT  DATE OF PROCEDURE:  11/17/2020  PREOPERATIVE DIAGNOSIS:  Right comminuted periprosthetic proximal femur fracture with stable femoral prosthesis, [place] type B.  POSTOPERATIVE DIAGNOSIS:  Right comminuted periprosthetic proximal femur fracture with stable femoral prosthesis, [place] type B.  PROCEDURE:  Open reduction internal fixation of right periprosthetic femur fracture utilizing a 4-hole Zimmer cable claw plate.  An additional cable was utilized for provisional fixation and kept in place.  SURGEON:  Paralee Cancel, MD  ASSISTANT:  Griffith Citron.  Note that Ms. Nehemiah Settle was present for the critical portion of the case for assistance in reduction, maintenance of reduction, application of a cable claw plate while maintaining reduction as well as primary wound closure.  ANESTHESIA:  General.  BLOOD LOSS:  450 mL.  IV FLUIDS:  Included 1 unit of packed red blood cells given her preoperative hemoglobin of 8.3 after 2 units of blood.  DRAINS:  None.  COMPLICATIONS:  None apparent.  INDICATIONS:  The patient is a pleasant 87 year old female who unfortunately had a ground level fall, slipping on ice on Friday, the 21st.  She crawled back to the house based on inability to bear weight and was eventually transferred to the hospital by  EMS.  Radiographs revealed a comminuted periprosthetic femur fracture.  We were consulted for management of the complex fixation and treatment of this periprosthetic fracture including decision making and the procedure itself.  Risks of infection,  nonunion, malunion, further complications regarding her prosthesis were all reviewed and consent was obtained for benefit of pain relief.  DESCRIPTION OF PROCEDURE:  The patient was brought to the operative theater.  Once  adequate anesthesia, preoperative antibiotics, Ancef administered, as well as tranexamic acid she was positioned into the left lateral decubitus position with the right  hip up.  The right lower extremity was prepped and draped in sterile fashion.  A timeout was performed identifying the patient, the planned procedure and extremity.  Her previous incision was marked on the skin.  We utilized the distal portion extending  to the proximal third of the incision and then extended it distally for exposure purposes given the long plate that was utilized.  Soft tissue dissection was carried down to the iliotibial band and gluteal fascia.  This was then incised evacuating  hematoma from her fracture site.  The old Ethibond sutures were removed.  I then identified the significant comminuted fracture involving the greater trochanter into the shaft.  The fractures sites were identified.  I then elevated the vastus lateralis  off the posterior intermuscular septum.  There was noted to be muscle damage related to the fracture in this area.  Retractors were placed.  At this point, based on the comminuted nature and the osteoporotic nature of her bone, I elected to place a cable  for provisional fixation.  I was able to apply a cable around the mid portion of her fracture site.  This provisionally provided some stability to the fracture site.  I assessed the femoral component and as I felt was the case based on her preoperative  radiographs, there was no evidence of instability or motion of the femoral component within the shaft of the femur.  It moved in unison.  Once this was done, I exposed further the greater trochanter noting significant comminution and osteoporosis to the  trochanter.  We opened up the 4-hole long cable  claw plate.  I then applied just to the proximal aspect of the trochanter, which was noted to be very weak and osteoporotic.  I then placed this over the shaft of her femur.  With direct proximal  pressure  placed onto the trochanter I placed a single cable in the proximal cable hole.  I then placed one distal to the area where I had placed a provisional cable.  At this point, the cable construct was relatively stable.  I removed the attachment device for  insertion of the plate.  I then passed 2 cables in the proximal holes around the lesser trochanter.  These were crimped and cut.  A final cable was placed in the distal portion of the plate.  At this point, we had 2 proximal cables and 3 cables around  the plate as well as a provisional cable.  At this point, I felt that the fracture fragments were all placed into a near anatomic position based on the visualization anatomically.  I irrigated the wound with normal saline solution.  We placed the vastus  lateralis over the plate of the construct and then reapproximated the iliotibial band and gluteal fascia using #1 Vicryl and #1 Stratafix suture.  The remainder of the wound was closed in layers with 2-0 Vicryl and a running Monocryl stitch.  The hip was  then cleaned, dried and dressed sterilely using surgical glue and Aquacel dressing.  She was then brought to the recovery room in stable condition, tolerating the procedure well.  She did have a low hemoglobin preoperatively related to her blood loss from the fracture as well as perhaps with chronic disease.  We did give her another unit of blood in the OR with another unit available.  She remained hemodynamically stable  throughout the case.  Please see anesthesia records for any other necessary information.  Postoperatively, she will be partial weightbearing with the use of a walker for 6-12 weeks depending on fracture healing and stability of the construct.  She will be in the hospital for the next 2-3 days as they work with physical therapy to determine  her needs.  HN/NUANCE  D:11/17/2020 T:11/18/2020 JOB:[id number]

## 2020-11-19 DIAGNOSIS — S72001A Fracture of unspecified part of neck of right femur, initial encounter for closed fracture: Secondary | ICD-10-CM | POA: Diagnosis not present

## 2020-11-19 LAB — BPAM RBC
Blood Product Expiration Date: 202202022359
Blood Product Expiration Date: 202202022359
Blood Product Expiration Date: 202202162359
Blood Product Expiration Date: 202202182359
ISSUE DATE / TIME: 202201231003
ISSUE DATE / TIME: 202201231318
ISSUE DATE / TIME: 202201241525
ISSUE DATE / TIME: 202201241525
Unit Type and Rh: 6200
Unit Type and Rh: 6200
Unit Type and Rh: 6200
Unit Type and Rh: 6200

## 2020-11-19 LAB — TYPE AND SCREEN
ABO/RH(D): A POS
Antibody Screen: NEGATIVE
Unit division: 0
Unit division: 0
Unit division: 0
Unit division: 0

## 2020-11-19 LAB — BASIC METABOLIC PANEL
Anion gap: 8 (ref 5–15)
Anion gap: 9 (ref 5–15)
BUN: 16 mg/dL (ref 8–23)
BUN: 16 mg/dL (ref 8–23)
CO2: 23 mmol/L (ref 22–32)
CO2: 24 mmol/L (ref 22–32)
Calcium: 7.3 mg/dL — ABNORMAL LOW (ref 8.9–10.3)
Calcium: 7.5 mg/dL — ABNORMAL LOW (ref 8.9–10.3)
Chloride: 104 mmol/L (ref 98–111)
Chloride: 104 mmol/L (ref 98–111)
Creatinine, Ser: 0.55 mg/dL (ref 0.44–1.00)
Creatinine, Ser: 0.61 mg/dL (ref 0.44–1.00)
GFR, Estimated: >60 mL/min (ref >60)
GFR, Estimated: >60 mL/min (ref >60)
Glucose, Bld: 103 mg/dL — ABNORMAL HIGH (ref 70–99)
Glucose, Bld: 122 mg/dL — ABNORMAL HIGH (ref 70–99)
Potassium: 3.5 mmol/L (ref 3.5–5.1)
Potassium: 3.6 mmol/L (ref 3.5–5.1)
Sodium: 135 mmol/L (ref 135–145)
Sodium: 137 mmol/L (ref 135–145)

## 2020-11-19 LAB — CBC
HCT: 26 % — ABNORMAL LOW (ref 36.0–46.0)
Hemoglobin: 8.7 g/dL — ABNORMAL LOW (ref 12.0–15.0)
MCH: 31 pg (ref 26.0–34.0)
MCHC: 33.5 g/dL (ref 30.0–36.0)
MCV: 92.5 fL (ref 80.0–100.0)
Platelets: 144 10*3/uL — ABNORMAL LOW (ref 150–400)
RBC: 2.81 MIL/uL — ABNORMAL LOW (ref 3.87–5.11)
RDW: 14.5 % (ref 11.5–15.5)
WBC: 10.4 10*3/uL (ref 4.0–10.5)
nRBC: 0 % (ref 0.0–0.2)

## 2020-11-19 LAB — MAGNESIUM: Magnesium: 2.4 mg/dL (ref 1.7–2.4)

## 2020-11-19 LAB — TROPONIN I (HIGH SENSITIVITY): Troponin I (High Sensitivity): 11 ng/L (ref <18)

## 2020-11-19 MED ORDER — METOPROLOL TARTRATE 12.5 MG HALF TABLET
12.5000 mg | ORAL_TABLET | Freq: Two times a day (BID) | ORAL | Status: DC
  Administered 2020-11-19: 23:00:00 12.5 mg via ORAL
  Filled 2020-11-19: qty 1

## 2020-11-19 MED ORDER — POTASSIUM CHLORIDE CRYS ER 20 MEQ PO TBCR
40.0000 meq | EXTENDED_RELEASE_TABLET | Freq: Two times a day (BID) | ORAL | Status: DC
  Administered 2020-11-19: 23:00:00 40 meq via ORAL
  Filled 2020-11-19: qty 2

## 2020-11-19 MED ORDER — METOPROLOL TARTRATE 5 MG/5ML IV SOLN
INTRAVENOUS | Status: AC
  Administered 2020-11-19: 2.5 mg via INTRAMUSCULAR
  Filled 2020-11-19: qty 5

## 2020-11-19 MED ORDER — METOPROLOL TARTRATE 5 MG/5ML IV SOLN
2.5000 mg | Freq: Once | INTRAVENOUS | Status: AC
  Administered 2020-11-19: 23:00:00 2.5 mg via INTRAVENOUS

## 2020-11-19 NOTE — Progress Notes (Signed)
Physical Therapy Treatment Patient Details Name: [name] MRN: [id number] DOB: 05-17-1932 Today's Date: 11/19/2020    History of Present Illness [name] is a 87 y.o. female with PMH significant for hypertension, osteoporosis, Right THA.  Patient presented to the ED on 1/21 after a mechanical fall on ice.  X-ray showed proximal right femur fracture  Admitted to hospitalist service  Orthopedics consulted.  1/24, patient underwent ORIF of right periprosthetic fracture.    PT Comments    Pt able to ambulate and use bathroom with min/guard assist for safety today.  Pt slow to mobilize however requiring less assist then last session.  Pt prefers to d/c home.  Will need to practice one step prior to d/c.   Follow Up Recommendations  Home health PT;Supervision for mobility/OOB     Equipment Recommendations  None recommended by PT    Recommendations for Other Services       Precautions / Restrictions Precautions Precautions: Fall Restrictions Weight Bearing Restrictions: Yes RLE Weight Bearing: Partial weight bearing RLE Partial Weight Bearing Percentage or Pounds: 50    Mobility  Bed Mobility Overal bed mobility: Needs Assistance Bed Mobility: Sit to Supine       Sit to supine: Min assist   General bed mobility comments: assist for R LE  Transfers Overall transfer level: Needs assistance Equipment used: Rolling walker (2 wheeled) Transfers: Sit to/from Stand Sit to Stand: Min guard         General transfer comment: min/guard for safety, cues for UE and LE positioning for pain control  Ambulation/Gait Ambulation/Gait assistance: Min guard Gait Distance (Feet): 60 Feet Assistive device: Rolling walker (2 wheeled) Gait Pattern/deviations: Step-to pattern;Antalgic     General Gait Details: cues for posture and RW positioning and 50% WB   Stairs             Wheelchair Mobility    Modified Rankin (Stroke Patients Only)        Balance                                            Cognition Arousal/Alertness: Awake/alert Behavior During Therapy: WFL for tasks assessed/performed Overall Cognitive Status: Within Functional Limits for tasks assessed                                        Exercises      General Comments        Pertinent Vitals/Pain Pain Assessment: Faces Faces Pain Scale: Hurts a little bit Pain Location: right thigh Pain Descriptors / Indicators: Sore Pain Intervention(s): Repositioned;Premedicated before session;Monitored during session;Ice applied    Home Living                      Prior Function            PT Goals (current goals can now be found in the care plan section) Progress towards PT goals: Progressing toward goals    Frequency    Min 5X/week      PT Plan Current plan remains appropriate    Co-evaluation              AM-PAC PT "6 Clicks" Mobility   Outcome Measure  Help needed turning from your back to your side while  in a flat bed without using bedrails?: A Little Help needed moving from lying on your back to sitting on the side of a flat bed without using bedrails?: A Little Help needed moving to and from a bed to a chair (including a wheelchair)?: A Little Help needed standing up from a chair using your arms (e.g., wheelchair or bedside chair)?: A Little Help needed to walk in hospital room?: A Little Help needed climbing 3-5 steps with a railing? : A Lot 6 Click Score: 17    End of Session Equipment Utilized During Treatment: Gait belt Activity Tolerance: Patient tolerated treatment well Patient left: with call bell/phone within reach;in bed;with bed alarm set Nurse Communication: Mobility status PT Visit Diagnosis: Unsteadiness on feet (R26.81);Muscle weakness (generalized) (M62.81);Difficulty in walking, not elsewhere classified (R26.2)     Time: 3875-6433 PT Time Calculation (min) (ACUTE ONLY):  24 min  Charges:  $Gait Training: 8-22 mins $Therapeutic Activity: 8-22 mins                    Jannette Spanner PT, DPT Acute Rehabilitation Services Pager: [phone] Office: [phone]   York Ram E 11/19/2020, 1:25 PM

## 2020-11-19 NOTE — Progress Notes (Signed)
rm 1301 [name], 88yof ORIF on 1/24.By Dr. Alvan Dame, Hx HTN. Now has HR 155 BP 119/72, R20 Sat96 Mews 3.  pls call.

## 2020-11-19 NOTE — Progress Notes (Signed)
Patient ID: [name], female   DOB: April 18, 1932, 87 y.o.   MRN: [id number] Subjective: 2 Days Post-Op Procedure(s) (LRB): ORIF of Right periprosthetic fracture (Right)    Patient reports pain as moderate.  Still with pain making it hard to move her right LE. Also complains of mouth sores - has been giving mouth wash to try and help   Objective:   VITALS:   Vitals:   11/18/20 1345 11/18/20 1603  BP: (!) 147/82 126/77  Pulse: 99 88  Resp: 14 14  Temp: 98.1 F (36.7 C) 98.2 F (36.8 C)  SpO2: 97% 96%    Neurovascular intact Incision: dressing C/D/I - right hip Limited active knee flexion due to pain but trying  LABS Recent Labs    11/17/20 0524 11/18/20 0511 11/19/20 0509  HGB 8.9* 9.6* 8.7*  HCT 26.1* 28.0* 26.0*  WBC 9.4 10.9* 10.4  PLT 99* 119* 144*    Recent Labs    11/17/20 0524 11/18/20 0511 11/19/20 0509  NA 133* 133* 135  K 3.8 3.9 3.5  BUN 11 12 16   CREATININE 0.63 0.67 0.55  GLUCOSE 117* 146* 103*    No results for input(s): LABPT, INR in the last 72 hours.   Assessment/Plan: 2 Days Post-Op Procedure(s) (LRB): ORIF of Right periprosthetic fracture (Right)   Up with therapy - PWB RLE Given how she is doing this am she will likely need [facility] SN[facility] rehab prior to being safe enough to get home Continue with therapy to determine if there is enough progress to consider other alternatives Oral sores per medicine Continue pain meds and DVT prophylaxis

## 2020-11-19 NOTE — Plan of Care (Addendum)
Called to bedside, pt tachycardic. Found pt with HR irregular HR 170s. Denies any chest pain, nausea, abdominal pain or SOB. Obtained stat EKG. Pt in Afib RVR. Pt with c/o pain.   A&P PAF with RVR  -Gave PRN morphine and 2.5mg  IV metoprolol and started pt on low -dose metoprolol. (pt takes bisoprolol at home). -Stat BMP & trop -Goal to keep K>4 & Mag >2 -CHADs2Vasc Score 4. Pt with recent surgery. Will defer anticoagulation plan to day team.

## 2020-11-19 NOTE — Evaluation (Signed)
Occupational Therapy Evaluation Patient Details Name: [name] MRN: [id number] DOB: 1932-08-22 Today's Date: 11/19/2020    History of Present Illness [name] is a 87 y.o. female with PMH significant for hypertension, osteoporosis, Right THA.  Patient presented to the ED on 1/21 after a mechanical fall on ice.  X-ray showed proximal right femur fracture  Admitted to hospitalist service  Orthopedics consulted.  1/24, patient underwent ORIF of right periprosthetic fracture.   Clinical Impression   [name] is an 87 year old woman s/p right hip fracture repair who presents with decrease ROM and strength of RLE, PWB restriction, edema in RLE, complaints of pain, impaired balance and decreased activity tolerance. Patient mod assist to transfer to side of bed and min guard with RW to transfer to [facility] and then recliner. Ambulation limited by decreased activity toleranc and patient reporting soreness in bilateral wrists. Patient needing increased assistance with LB ADLs and seated positioning for UB ADLs and grooming. Patient will benefit from skilled OT services while in hospital to improve deficits and learn compensatory strategies as needed in order to return PLOF.      Follow Up Recommendations  SNF    Equipment Recommendations  None recommended by OT    Recommendations for Other Services       Precautions / Restrictions Precautions Precautions: Fall Restrictions Weight Bearing Restrictions: Yes RLE Weight Bearing: Partial weight bearing RLE Partial Weight Bearing Percentage or Pounds: 50      Mobility Bed Mobility Overal bed mobility: Needs Assistance Bed Mobility: Supine to Sit     Supine to sit: Mod assist     General bed mobility comments: Min assist for RLE and hand hold to assist with pulling up.    Transfers Overall transfer level: Needs assistance Equipment used: Rolling walker (2 wheeled) Transfers: Sit to/from [employer] Sit to Stand: Min guard Stand pivot transfers: Min guard       General transfer comment: min guard to transfer to [facility] and then to recliner with use of RW.    Balance Overall balance assessment: Needs assistance Sitting-balance support: Feet supported Sitting balance-Leahy Scale: Good     Standing balance support: Bilateral upper extremity supported;During functional activity Standing balance-Leahy Scale: Poor Standing balance comment: reliant on RW and UE's                           ADL either performed or assessed with clinical judgement   ADL Overall ADL's : Needs assistance/impaired Eating/Feeding: Independent   Grooming: Set up;Wash/dry face;Sitting   Upper Body Bathing: Set up;Sitting   Lower Body Bathing: Minimal assistance;Set up;Sitting/lateral leans   Upper Body Dressing : Set up;Sitting   Lower Body Dressing: Moderate assistance;Sit to/from stand   Toilet Transfer: Min Advice worker Details (indicate cue type and reason): pivoted to [place] Manipulation and Hygiene: Set up;Minimal assistance;Sitting/lateral lean Toileting - Clothing Manipulation Details (indicate cue type and reason): min assist for clothing management             Vision Patient Visual Report: No change from baseline       Perception     Praxis      Pertinent Vitals/Pain Pain Assessment: Faces Faces Pain Scale: Hurts little more Pain Location: right thigh Pain Descriptors / Indicators: Aching;Tightness;Discomfort;Grimacing;Guarding Pain Intervention(s): Premedicated before session;Monitored during session;Limited activity within patient's tolerance     Hand Dominance Right   Extremity/Trunk Assessment Upper Extremity  Assessment Upper Extremity Assessment: Overall WFL for tasks assessed   Lower Extremity Assessment Lower Extremity Assessment: Defer to PT evaluation   Cervical / Trunk Assessment Cervical  / Trunk Assessment: Normal   Communication Communication Communication: No difficulties   Cognition Arousal/Alertness: Awake/alert Behavior During Therapy: WFL for tasks assessed/performed Overall Cognitive Status: Within Functional Limits for tasks assessed                                     General Comments       Exercises     Shoulder Instructions      Home Living Family/patient expects to be discharged to:: Private residence Living Arrangements: Spouse/significant other Available Help at Discharge: Family;Available 24 hours/day Type of Home: House Home Access: Stairs to enter [employer] of Steps: 1 GARAGE   Home Layout: One level     Bathroom Shower/Tub: Walk-in shower         Home Equipment: Environmental consultant - 2 wheels;Cane - single point;Bedside commode;Toilet riser          Prior Functioning/Environment Level of Independence: Independent        Comments: Repors active at baseline wih yardwork and gardening.        OT Problem List: Decreased strength;Decreased range of motion;Decreased activity tolerance;Impaired balance (sitting and/or standing);Decreased knowledge of use of DME or AE;Decreased knowledge of precautions;Pain;Increased edema      OT Treatment/Interventions: Self-care/ADL training;Therapeutic exercise;DME and/or AE instruction;Therapeutic activities;Balance training;Patient/family education    OT Goals(Current goals can be found in the care plan section) Acute Rehab OT Goals Patient Stated Goal: to go home OT Goal Formulation: With patient Time For Goal Achievement: 12/03/20 Potential to Achieve Goals: Good  OT Frequency: Min 2X/week   Barriers to D/C:            Co-evaluation              AM-PAC OT "6 Clicks" Daily Activity     Outcome Measure Help from another person eating meals?: None Help from another person taking care of personal grooming?: A Little Help from another person toileting, which  includes using toliet, bedpan, or urinal?: A Little Help from another person bathing (including washing, rinsing, drying)?: A Little Help from another person to put on and taking off regular upper body clothing?: A Little Help from another person to put on and taking off regular lower body clothing?: A Lot 6 Click Score: 18   End of Session Equipment Utilized During Treatment: Rolling walker Nurse Communication: Mobility status  Activity Tolerance: Patient limited by pain Patient left: in chair;with call bell/phone within reach;with chair alarm set  OT Visit Diagnosis: Unsteadiness on feet (R26.81);Other abnormalities of gait and mobility (R26.89);History of falling (Z91.81);Pain                Time: 3267-1245 OT Time Calculation (min): 19 min Charges:  OT General Charges $OT Visit: 1 Visit OT Evaluation $OT Eval Low Complexity: 1 Low  Ryker Pherigo, OTR/L [place]  Office [phone] Pager: [place] 11/19/2020, 8:59 AM

## 2020-11-19 NOTE — Plan of Care (Signed)
  Problem: Pain Management: Goal: Pain level will decrease with appropriate interventions Outcome: Progressing   Problem: Skin Integrity: Goal: Will show signs of wound healing Outcome: Progressing   Problem: Activity: Goal: Ability to avoid complications of mobility impairment will improve Outcome: Progressing Goal: Ability to tolerate increased activity will improve Outcome: Progressing
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
## 2020-11-19 NOTE — Progress Notes (Signed)
PROGRESS NOTE  MAIRLYN TEGTMEYER  DOB: Jun 04, 1932  PCP: Eulas Post, MD QVZ:[id number]  DOA: 11/14/2020  LOS: 5 days   Chief Complaint  Patient presents with  . Fall    Brief narrative: [name] is a 87 y.o. female with PMH significant for hypertension, osteoporosis. Patient presented to the ED on 1/21 after a mechanical fall on ice. X-ray showed proximal right femur fracture Admitted to hospitalist service Orthopedics consulted. 1/24, patient underwent ORIF of right periprosthetic fracture.  Subjective: Patient was seen and examined this morning.  Elderly Caucasian female.   Not in distress.  Not in pain.  Husband at bedside.    Assessment/Plan: Acute minimally displaced periprosthetic fracture in proximal shaft of right femur -1/24, patient underwent ORIF of right periprosthetic fracture. -Pain management with Norco as needed. -Per orthopedics team, DVT prophylaxis with aspirin 81 mg twice daily.  Essential hypertension -Blood pressure stable on bisoprolol.  HCTZ on hold. -Okay to stop IV fluid today.  Acute kidney injury -Improved with IV fluid.  HCTZ on hold.   Recent Labs    08/05/20 1559 11/14/20 1403 11/15/20 0556 11/16/20 0550 11/17/20 0524 11/18/20 0511 11/19/20 0509  BUN 21 24* 28* 19 11 12 16   CREATININE 0.77 0.76 1.05* 0.77 0.63 0.67 0.55   Acute blood anemia -Hemoglobin level low at 7.3 at presentation, dipped down to 5.6. -CT scan with significant amount of hemorrhage at the fracture site. -2 units of PRBCs transfused.  Hemoglobin improved.  There is a drop from 9.6 yesterday to 8.7 today.  Probably dilutional.  No active bleeding.  Continue to monitor.   Recent Labs    11/15/20 0747 11/16/20 0550 11/16/20 1955 11/17/20 0524 11/18/20 0511 11/19/20 0509  HGB 7.1* 5.6* 8.9* 8.9* 9.6* 8.7*  MCV 98.6 100.0  --  92.2 89.7 92.5  VITAMINB12 631  --   --   --   --   --   FOLATE 30.8  --   --   --   --   --   FERRITIN 52   --   --   --   --   --   TIBC 260  --   --   --   --   --   IRON 54  --   --   --   --   --   RETICCTPCT 1.6  --   --   --   --   --    Osteoporosis Vitamin D deficiency Continue calcium and vitamin D supplementation.  Acute urinary retention. -Foley catheter on.  Mobility: PT evaluation pending. Code Status:   Code Status: Full Code  Nutritional status: Body mass index is 19.21 kg/m. Nutrition Problem: Increased nutrient needs Etiology: hip fracture Signs/Symptoms: estimated needs Diet Order            Diet regular Room service appropriate? Yes; Fluid consistency: Thin  Diet effective now                 DVT prophylaxis: SCDs Start: 11/17/20 1941 Place TED hose Start: 11/17/20 1941 SCDs Start: 11/14/20 1658   Antimicrobials:  None Fluid: Stop IV fluid today Consultants: Orthopedics Family Communication:  Husband at bedside  Status is: Inpatient   Remains inpatient appropriate because: Home health PT versus SNF recommended by PT.  Family making a decision  Dispo: The patient is from: Home              Anticipated d/c is to: Likely  home with home health tomorrow              Anticipated d/c date is: Tomorrow              Patient currently is not medically stable to d/c.   Difficult to place patient No       Infusions:  . lactated ringers 75 mL/hr at 11/18/20 1054  . methocarbamol (ROBAXIN) IV 500 mg (11/17/20 1811)    Scheduled Meds: . aspirin EC  81 mg Oral BID  . docusate sodium  100 mg Oral BID  . feeding supplement  237 mL Oral BID BM  . ferrous sulfate  325 mg Oral BID WC  . multivitamin with minerals  1 tablet Oral Daily  . omega-3 acid ethyl esters  1 g Oral BID  . Vitamin D (Ergocalciferol)  50,000 Units Oral Q7 days    Antimicrobials: Anti-infectives (From admission, onward)   Start     Dose/Rate Route Frequency Ordered Stop   11/17/20 2200  ceFAZolin (ANCEF) IVPB 1 g/50 mL premix        1 g 100 mL/hr over 30 Minutes Intravenous Every  6 hours 11/17/20 1940 11/18/20 1034   11/17/20 1500  ceFAZolin (ANCEF) IVPB 2g/100 mL premix        2 g 200 mL/hr over 30 Minutes Intravenous On call to O.R. 11/17/20 1146 11/17/20 1625      PRN meds: acetaminophen, bisacodyl, diphenhydrAMINE, HYDROcodone-acetaminophen, HYDROcodone-acetaminophen, menthol-cetylpyridinium **OR** phenol, methocarbamol **OR** methocarbamol (ROBAXIN) IV, metoCLOPramide **OR** metoCLOPramide (REGLAN) injection, morphine injection, ondansetron **OR** ondansetron (ZOFRAN) IV, polyvinyl alcohol, senna-docusate   Objective: Vitals:   11/18/20 1345 11/18/20 1603  BP: (!) 147/82 126/77  Pulse: 99 88  Resp: 14 14  Temp: 98.1 F (36.7 C) 98.2 F (36.8 C)  SpO2: 97% 96%    Intake/Output Summary (Last 24 hours) at 11/19/2020 1425 Last data filed at 11/19/2020 1418 Gross per 24 hour  Intake 1200.05 ml  Output 1850 ml  Net -649.95 ml   Filed Weights   11/14/20 1215  Weight: 54 kg   Weight change:  Body mass index is 19.21 kg/m.   Physical Exam: General exam: Pleasant, elderly Caucasian female.  Not in distress.  Pain controlled Skin: No rashes, lesions or ulcers. HEENT: Atraumatic, normocephalic, no obvious bleeding Lungs: Clear to auscultation bilaterally CVS: Regular rate and rhythm, no murmur GI/Abd soft, nontender, nondistended, bowel sound present CNS: Alert, awake, oriented x3 Psychiatry: Mood appropriate Extremities: No pedal edema, no calf tenderness  Data Review: I have personally reviewed the laboratory data and studies available.  Recent Labs  Lab 11/14/20 1403 11/15/20 0556 11/15/20 0747 11/16/20 0550 11/16/20 1955 11/17/20 0524 11/18/20 0511 11/19/20 0509  WBC 20.0*   < > 8.6 7.6  --  9.4 10.9* 10.4  NEUTROABS 17.6*  --  5.5  --   --   --   --   --   HGB 10.1*   < > 7.1* 5.6* 8.9* 8.9* 9.6* 8.7*  HCT 31.0*   < > 21.9* 17.6* 26.6* 26.1* 28.0* 26.0*  MCV 96.9   < > 98.6 100.0  --  92.2 89.7 92.5  PLT 169   < > 123* 90*  --  99*  119* 144*   < > = values in this interval not displayed.   Recent Labs  Lab 11/15/20 0556 11/16/20 0550 11/17/20 0524 11/18/20 0511 11/19/20 0509  NA 134* 134* 133* 133* 135  K 4.2 3.9 3.8 3.9 3.5  CL 102 107 106 103 104  CO2 23 23 20* 20* 23  GLUCOSE 119* 106* 117* 146* 103*  BUN 28* 19 11 12 16   CREATININE 1.05* 0.77 0.63 0.67 0.55  CALCIUM 7.9* 7.2* 7.4* 7.1* 7.3*  MG  --  2.2 2.1  --   --     F/u labs ordered  Signed, Terrilee Croak, MD [facility] 11/19/2020

## 2020-11-19 NOTE — Progress Notes (Signed)
Physical Therapy Treatment Patient Details Name: [name] MRN: [id number] DOB: 1932-03-03 Today's Date: 11/19/2020    History of Present Illness [name] is a 87 y.o. female with PMH significant for hypertension, osteoporosis, Right THA.  Patient presented to the ED on 1/21 after a mechanical fall on ice.  X-ray showed proximal right femur fracture  Admitted to hospitalist service  Orthopedics consulted.  1/24, patient underwent ORIF of right periprosthetic fracture.    PT Comments    Pt performed a couple exercises in bed and then ambulated short distance in hallway.  Ambulation limited by dizziness and increased pain.   Follow Up Recommendations  Home health PT;Supervision for mobility/OOB     Equipment Recommendations  None recommended by PT    Recommendations for Other Services       Precautions / Restrictions Precautions Precautions: Fall Restrictions Weight Bearing Restrictions: Yes RLE Weight Bearing: Partial weight bearing RLE Partial Weight Bearing Percentage or Pounds: 50    Mobility  Bed Mobility Overal bed mobility: Needs Assistance Bed Mobility: Sit to Supine;Supine to Sit     Supine to sit: Min guard Sit to supine: Min assist   General bed mobility comments: assist for R LE  Transfers Overall transfer level: Needs assistance Equipment used: Rolling walker (2 wheeled) Transfers: Sit to/from Stand Sit to Stand: Min guard;Min assist         General transfer comment: min/guard for safety, cues for UE and LE positioning for pain control, min assist to control descent  Ambulation/Gait Ambulation/Gait assistance: Min guard Gait Distance (Feet): 24 Feet Assistive device: Rolling walker (2 wheeled) Gait Pattern/deviations: Step-to pattern;Antalgic Gait velocity: decr   General Gait Details: cues for posture and RW positioning, pt reports dizziness with ambulation so limited distance; vitals upon sitting on bed: 105 bpm, 149/70  bpm, 97% Spo2.  Pt reports increased pain however so RN notified.   Stairs             Wheelchair Mobility    Modified Rankin (Stroke Patients Only)       Balance                                            Cognition Arousal/Alertness: Awake/alert Behavior During Therapy: WFL for tasks assessed/performed Overall Cognitive Status: Within Functional Limits for tasks assessed                                        Exercises General Exercises - Lower Extremity Quad Sets: AROM;Right;10 reps Heel Slides: AAROM;Right;10 reps Hip ABduction/ADduction: AAROM;Right;10 reps    General Comments        Pertinent Vitals/Pain Pain Assessment: 0-10 Pain Score: 10-Worst pain ever Faces Pain Scale: Hurts a little bit Pain Location: right thigh Pain Descriptors / Indicators: Sore;Aching Pain Intervention(s): Repositioned;Monitored during session;Patient requesting pain meds-RN notified;Ice applied    Home Living                      Prior Function            PT Goals (current goals can now be found in the care plan section) Progress towards PT goals: Progressing toward goals    Frequency    Min 5X/week      PT Plan Current  plan remains appropriate    Co-evaluation              AM-PAC PT "6 Clicks" Mobility   Outcome Measure  Help needed turning from your back to your side while in a flat bed without using bedrails?: A Little Help needed moving from lying on your back to sitting on the side of a flat bed without using bedrails?: A Little Help needed moving to and from a bed to a chair (including a wheelchair)?: A Little Help needed standing up from a chair using your arms (e.g., wheelchair or bedside chair)?: A Little Help needed to walk in hospital room?: A Little Help needed climbing 3-5 steps with a railing? : A Lot 6 Click Score: 17    End of Session Equipment Utilized During Treatment: Gait belt Activity  Tolerance: Patient tolerated treatment well Patient left: with call bell/phone within reach;in bed;with bed alarm set Nurse Communication: Mobility status PT Visit Diagnosis: Unsteadiness on feet (R26.81);Muscle weakness (generalized) (M62.81);Difficulty in walking, not elsewhere classified (R26.2)     Time: 1430-1450 PT Time Calculation (min) (ACUTE ONLY): 20 min  Charges:  $Gait Training: 8-22 mins                      Jannette Spanner PT, DPT Acute Rehabilitation Services Pager: [phone] Office: [phone]     Trena Platt 11/19/2020, 3:21 PM

## 2020-11-19 NOTE — TOC Progression Note (Signed)
Transition of Care [facility]) - Progression Note    Patient Details  Name: [name] MRN: [id number] Date of Birth: 1932-08-28  [facility]) CM/SW Contact  Lennart Pall, LCSW Phone Number: 11/19/2020, 3:28 PM  Clinical Narrative:     HHPT referral placed and accepted with [facility].  Expected Discharge Plan: [place] Barriers to Discharge: Continued Medical Work up  Expected Discharge Plan and Services Expected Discharge Plan: [place] In-house Referral: Clinical Social Work     Living arrangements for the past 2 months: Single Family Home                 DME Arranged: N/A DME Agency: NA                   Social Determinants of Health (SDOH) Interventions    Readmission Risk Interventions Readmission Risk Prevention Plan 11/19/2020  Post Dischage Appt Complete  Medication Screening Complete  Transportation Screening Complete  Some recent data might be hidden

## 2020-11-20 DIAGNOSIS — S72001A Fracture of unspecified part of neck of right femur, initial encounter for closed fracture: Secondary | ICD-10-CM | POA: Diagnosis not present

## 2020-11-20 LAB — BASIC METABOLIC PANEL
Anion gap: 6 (ref 5–15)
BUN: 17 mg/dL (ref 8–23)
CO2: 26 mmol/L (ref 22–32)
Calcium: 7.3 mg/dL — ABNORMAL LOW (ref 8.9–10.3)
Chloride: 103 mmol/L (ref 98–111)
Creatinine, Ser: 0.58 mg/dL (ref 0.44–1.00)
GFR, Estimated: >60 mL/min (ref >60)
Glucose, Bld: 111 mg/dL — ABNORMAL HIGH (ref 70–99)
Potassium: 4.4 mmol/L (ref 3.5–5.1)
Sodium: 135 mmol/L (ref 135–145)

## 2020-11-20 LAB — CBC
HCT: 27.1 % — ABNORMAL LOW (ref 36.0–46.0)
Hemoglobin: 9.1 g/dL — ABNORMAL LOW (ref 12.0–15.0)
MCH: 31.2 pg (ref 26.0–34.0)
MCHC: 33.6 g/dL (ref 30.0–36.0)
MCV: 92.8 fL (ref 80.0–100.0)
Platelets: 186 10*3/uL (ref 150–400)
RBC: 2.92 MIL/uL — ABNORMAL LOW (ref 3.87–5.11)
RDW: 14.5 % (ref 11.5–15.5)
WBC: 10.2 10*3/uL (ref 4.0–10.5)
nRBC: 0 % (ref 0.0–0.2)

## 2020-11-20 LAB — TROPONIN I (HIGH SENSITIVITY): Troponin I (High Sensitivity): 14 ng/L (ref <18)

## 2020-11-20 MED ORDER — METOPROLOL TARTRATE 12.5 MG HALF TABLET
12.5000 mg | ORAL_TABLET | Freq: Once | ORAL | Status: AC
  Administered 2020-11-20: 12.5 mg via ORAL
  Filled 2020-11-20: qty 1

## 2020-11-20 MED ORDER — METOPROLOL TARTRATE 25 MG PO TABS: 25.0000 mg | ORAL_TABLET | Freq: Two times a day (BID) | ORAL | Status: DC

## 2020-11-20 MED ORDER — BISOPROLOL FUMARATE 5 MG PO TABS
5.0000 mg | ORAL_TABLET | Freq: Every day | ORAL | Status: DC
Start: 2020-11-20 — End: 2020-11-22
  Administered 2020-11-20 – 2020-11-22 (×3): 5 mg via ORAL
  Filled 2020-11-20 (×3): qty 1

## 2020-11-20 NOTE — TOC Transition Note (Addendum)
Transition of Care [facility]) - CM/SW Discharge Note   Patient Details  Name: [name] MRN: [id number] Date of Birth: May 24, 1932  [facility] CM/SW Contact:  Lennart Pall, LCSW Phone Number: 11/20/2020, 9:59 AM   Clinical Narrative:    Per MD, pt to be cleared for dc home today.  [place] referral placed with [facility].  NO DME needs.  No further TOC needs.  ADDENDUM:  Alerted by MD that pt is now having concerns about dc home and reconsidering possible SNF.  Met with pt and spouse and pt reports that she is feeling "weaker" today.  Of note, had cardiac issues beginning last evening and plan to move pt to tele floor today.  Pt is also concerned that she hasn't had BM since Friday.  She is more anxious today and unsure about dc home.  Spouse still very much wants her to dc home, however, he defers to pt for final decision.  I will prep pt for SNF and begin bed search.  TOC to follow along to see final decision on dc plan once medically ready for dc.  Final next level of care: [place] Barriers to Discharge: Barriers Resolved   Patient Goals and CMS Choice Patient states their goals for this hospitalization and ongoing recovery are:: go home      Discharge Placement                       Discharge Plan and Services In-house Referral: Clinical Social Work              DME Arranged: N/A DME Agency: NA       HH Arranged: PT [place] Agency: [facility] Date [place] Agency Contacted: 11/19/20 Time [place]: 1400 Representative spoke with at [place]: [place]  Social Determinants of Health ([place]) Interventions     Readmission Risk Interventions Readmission Risk Prevention Plan 11/19/2020  Post Dischage Appt Complete  Medication Screening Complete  Transportation Screening Complete  Some recent data might be hidden

## 2020-11-20 NOTE — Anesthesia Postprocedure Evaluation (Signed)
Anesthesia Post Note  Patient: [name]  Procedure(s) Performed: ORIF of Right periprosthetic fracture (Right Hip)     Patient location during evaluation: PACU Anesthesia Type: General Level of consciousness: awake and alert Pain management: pain level controlled Vital Signs Assessment: post-procedure vital signs reviewed and stable Respiratory status: spontaneous breathing, nonlabored ventilation, respiratory function stable and patient connected to nasal cannula oxygen Cardiovascular status: blood pressure returned to baseline and stable Postop Assessment: no apparent nausea or vomiting Anesthetic complications: no   No complications documented.  Last Vitals:  Vitals:   11/20/20 1320 11/20/20 1522  BP: 129/81 113/72  Pulse: 86 88  Resp: 18 18  Temp: 36.9 C 37.3 C  SpO2: 97% 93%    Last Pain:  Vitals:   11/20/20 1522  TempSrc: Oral  PainSc:                  [place]
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
## 2020-11-20 NOTE — Progress Notes (Signed)
Physical Therapy Treatment Patient Details Name: [name] MRN: [id number] DOB: 1932/09/28 Today's Date: 11/20/2020    History of Present Illness [name] is a 87 y.o. female with PMH significant for hypertension, osteoporosis, Right THA.  Patient presented to the ED on 1/21 after a mechanical fall on ice.  X-ray showed proximal right femur fracture  Admitted to hospitalist service  Orthopedics consulted.  1/24, patient underwent ORIF of right periprosthetic fracture.  Pt also diagnosed with New onset A. fib 11/20/20    PT Comments    Pt reports tough night with pain and tachycardia.  Pt agreeable to mobilize and reports feeling a little better today.  Pt required min assist for mobility.  Follow Up Recommendations  SNF     Equipment Recommendations  None recommended by PT    Recommendations for Other Services       Precautions / Restrictions Precautions Precautions: Fall Restrictions Weight Bearing Restrictions: Yes RLE Weight Bearing: Partial weight bearing RLE Partial Weight Bearing Percentage or Pounds: 50    Mobility  Bed Mobility Overal bed mobility: Needs Assistance Bed Mobility: Supine to Sit;Sit to Supine     Supine to sit: Min guard Sit to supine: Min assist   General bed mobility comments: assist for R LE  Transfers Overall transfer level: Needs assistance Equipment used: Rolling walker (2 wheeled) Transfers: Sit to/from Stand Sit to Stand: Min assist Stand pivot transfers: Min assist       General transfer comment: cues for UE and LE positioning for pain control, min assist for steadying and controlling descent;  pt assisted to [facility] prior to ambulating in hallway  Ambulation/Gait Ambulation/Gait assistance: Min guard;Min assist Gait Distance (Feet): 80 Feet Assistive device: Rolling walker (2 wheeled) Gait Pattern/deviations: Step-to pattern;Antalgic     General Gait Details: cues for posture and RW positioning, pt denies  symptoms today, HR 98 bpm and SPO2 95% room air upon return to bed   Stairs             Wheelchair Mobility    Modified Rankin (Stroke Patients Only)       Balance                                            Cognition Arousal/Alertness: Awake/alert Behavior During Therapy: WFL for tasks assessed/performed Overall Cognitive Status: Within Functional Limits for tasks assessed                                        Exercises      General Comments        Pertinent Vitals/Pain Pain Assessment: 0-10 Pain Score: 6  Pain Location: right thigh Pain Descriptors / Indicators: Sore;Aching Pain Intervention(s): Repositioned;Monitored during session    Home Living                      Prior Function            PT Goals (current goals can now be found in the care plan section) Progress towards PT goals: Progressing toward goals    Frequency    Min 5X/week      PT Plan Discharge plan needs to be updated    Co-evaluation  AM-PAC PT "6 Clicks" Mobility   Outcome Measure  Help needed turning from your back to your side while in a flat bed without using bedrails?: A Little Help needed moving from lying on your back to sitting on the side of a flat bed without using bedrails?: A Little Help needed moving to and from a bed to a chair (including a wheelchair)?: A Little Help needed standing up from a chair using your arms (e.g., wheelchair or bedside chair)?: A Little Help needed to walk in hospital room?: A Little Help needed climbing 3-5 steps with a railing? : A Lot 6 Click Score: 17    End of Session Equipment Utilized During Treatment: Gait belt Activity Tolerance: Patient tolerated treatment well Patient left: with call bell/phone within reach;in bed;with bed alarm set   PT Visit Diagnosis: Unsteadiness on feet (R26.81);Muscle weakness (generalized) (M62.81);Difficulty in walking, not elsewhere  classified (R26.2)     Time: 6063-0160 PT Time Calculation (min) (ACUTE ONLY): 20 min  Charges:  $Gait Training: 8-22 mins                     Arlyce Dice, DPT Acute Rehabilitation Services Pager: [phone] Office: [phone]   York Ram E 11/20/2020, 3:26 PM

## 2020-11-20 NOTE — Progress Notes (Signed)
Transferred to room 1617, report given to [facility]

## 2020-11-20 NOTE — Progress Notes (Signed)
rm 1301,[name], [id number], OIRF on 1/24. Report on current HR 80-120,fluctuates, AFib. Mostly 100. Denies pain or stress.  Sleeping now.

## 2020-11-20 NOTE — Progress Notes (Signed)
Late entry note on night shift. During evening vital signs, NT reported to nurse that patient had heart of 110 to 150 and it is decreasing or stable., So nurse went to assess patient and recheck vitals.  On assessment nurse found that Heart rate was in 120's to 150s.  Nurse called NP to come to patient's room and assess patient as her mews was a three with a tachy HR. NPcame to patient's  bedside and assessed patient immediately.  NP  instructed nurse to give IV Morphine now at 2235, then Metoprolol IV 2. 5mg  IV was ordered now at 2304 which were done.  During this time EKG was ordered and complete while the NP was at bedside.  Troponin X2 was also ordered an overnight Patients vitals  remained at 80 and 110 with the above interventions. NP came back to reassess patient around 0600. Vitals signs were rechecked while NP was in the room.

## 2020-11-20 NOTE — Progress Notes (Signed)
PROGRESS NOTE  [name]  DOB: November 15, 1931  PCP: Eulas Post, MD [id number]  DOA: 11/14/2020  LOS: 6 days   Chief Complaint  Patient presents with  . Fall    Brief narrative: [name] is a 87 y.o. female with PMH significant for hypertension, osteoporosis. Patient presented to the ED on 1/21 after a mechanical fall on ice. X-ray showed proximal right femur fracture Admitted to hospitalist service Orthopedics consulted. 1/24, patient underwent ORIF of right periprosthetic fracture. Patient's mobility has been very limited post surgically.  Patient and her husband have been unclear about the side of next disposition.  This morning, they made a decision to go to SNF.  Subjective: Patient was seen and examined this morning.  Elderly Caucasian female.   Events of last night noted.  Patient was in pain.  She was in A. fib with RVR.  Given metoprolol. Remains slightly tachycardic this morning  Assessment/Plan: Acute minimally displaced periprosthetic fracture in proximal shaft of right femur -1/24, patient underwent ORIF of right periprosthetic fracture. -Pain management with Norco as needed. -Per orthopedics team, DVT prophylaxis with aspirin 81 mg twice daily.  A. fib with RVR -New onset A. fib probably related to pain. -given metoprolol last night. -Prior admission, patient was on bisoprolol 5 mg daily at home.  I would resume the same this morning. -Remain slightly tachycardic this morning.  Continue to monitor.  Essential hypertension -Blood pressure stable on bisoprolol.  HCTZ on hold.  Acute kidney injury -Improved with IV fluid.  HCTZ on hold.   Recent Labs    08/05/20 1559 11/14/20 1403 11/15/20 0556 11/16/20 0550 11/17/20 0524 11/18/20 0511 11/19/20 0509 11/19/20 2321 11/20/20 0129  BUN 21 24* 28* 19 11 12 16 16 17   CREATININE 0.77 0.76 1.05* 0.77 0.63 0.67 0.55 0.61 0.58   Acute blood anemia -Hemoglobin level low at 7.3  at presentation, dipped down to 5.6. -CT scan with significant amount of hemorrhage at the fracture site. -2 units of PRBCs transfused.  Hemoglobin improved.  Stable at 9.1 today.  No active bleeding. Recent Labs    11/15/20 0747 11/16/20 0550 11/16/20 1955 11/17/20 0524 11/18/20 0511 11/19/20 0509 11/20/20 0129  HGB 7.1*   < > 8.9* 8.9* 9.6* 8.7* 9.1*  MCV 98.6   < >  --  92.2 89.7 92.5 92.8  VITAMINB12 631  --   --   --   --   --   --   FOLATE 30.8  --   --   --   --   --   --   FERRITIN 52  --   --   --   --   --   --   TIBC 260  --   --   --   --   --   --   IRON 54  --   --   --   --   --   --   RETICCTPCT 1.6  --   --   --   --   --   --    < > = values in this interval not displayed.   Osteoporosis Vitamin D deficiency Continue calcium and vitamin D supplementation.  Acute urinary retention. -Foley catheter on.  Mobility: PT evaluation pending. Code Status:   Code Status: Full Code  Nutritional status: Body mass index is 19.21 kg/m. Nutrition Problem: Increased nutrient needs Etiology: hip fracture Signs/Symptoms: estimated needs Diet Order  Diet regular Room service appropriate? Yes; Fluid consistency: Thin  Diet effective now                 DVT prophylaxis: SCDs Start: 11/17/20 1941 Place TED hose Start: 11/17/20 1941 SCDs Start: 11/14/20 1658   Antimicrobials:  None Fluid: None IV fluid Consultants: Orthopedics Family Communication:  Husband at bedside  Status is: Inpatient   Remains inpatient appropriate because: Pending SNF.  Also has new onset A. fib with RVR  Dispo: The patient is from: Home              Anticipated d/c is to: Patient and family are making a decision to go to SNF.              Anticipated d/c date is: Tomorrow              Patient currently is not medically stable to d/c.   Difficult to place patient No  Infusions:  . methocarbamol (ROBAXIN) IV 500 mg (11/17/20 1811)    Scheduled Meds: . aspirin EC  81  mg Oral BID  . bisoprolol  5 mg Oral Daily  . docusate sodium  100 mg Oral BID  . feeding supplement  237 mL Oral BID BM  . ferrous sulfate  325 mg Oral BID WC  . multivitamin with minerals  1 tablet Oral Daily  . omega-3 acid ethyl esters  1 g Oral BID  . Vitamin D (Ergocalciferol)  50,000 Units Oral Q7 days    Antimicrobials: Anti-infectives (From admission, onward)   Start     Dose/Rate Route Frequency Ordered Stop   11/17/20 2200  ceFAZolin (ANCEF) IVPB 1 g/50 mL premix        1 g 100 mL/hr over 30 Minutes Intravenous Every 6 hours 11/17/20 1940 11/18/20 1034   11/17/20 1500  ceFAZolin (ANCEF) IVPB 2g/100 mL premix        2 g 200 mL/hr over 30 Minutes Intravenous On call to O.R. 11/17/20 1146 11/17/20 1625      PRN meds: acetaminophen, bisacodyl, diphenhydrAMINE, HYDROcodone-acetaminophen, HYDROcodone-acetaminophen, menthol-cetylpyridinium **OR** phenol, methocarbamol **OR** methocarbamol (ROBAXIN) IV, metoCLOPramide **OR** metoCLOPramide (REGLAN) injection, morphine injection, ondansetron **OR** ondansetron (ZOFRAN) IV, polyvinyl alcohol, senna-docusate   Objective: Vitals:   11/20/20 0609 11/20/20 1007  BP: 130/81 120/67  Pulse: (!) 111 97  Resp:  16  Temp:  98.4 F (36.9 C)  SpO2:  96%    Intake/Output Summary (Last 24 hours) at 11/20/2020 1132 Last data filed at 11/20/2020 1000 Gross per 24 hour  Intake 480 ml  Output 1400 ml  Net -920 ml   Filed Weights   11/14/20 1215  Weight: 54 kg   Weight change:  Body mass index is 19.21 kg/m.   Physical Exam: General exam: Pleasant, elderly Caucasian female.  Not in distress.  Pain controlled Skin: No rashes, lesions or ulcers. HEENT: Atraumatic, normocephalic, no obvious bleeding Lungs: Clear to auscultation bilaterally CVS: Regular rate and rhythm, no murmur GI/Abd soft, nontender, nondistended, bowel sound present CNS: Alert, awake, oriented x3 Psychiatry: Mood appropriate Extremities: No pedal edema, no  calf tenderness  Data Review: I have personally reviewed the laboratory data and studies available.  Recent Labs  Lab 11/14/20 1403 11/15/20 0556 11/15/20 0747 11/16/20 0550 11/16/20 1955 11/17/20 0524 11/18/20 0511 11/19/20 0509 11/20/20 0129  WBC 20.0*   < > 8.6 7.6  --  9.4 10.9* 10.4 10.2  NEUTROABS 17.6*  --  5.5  --   --   --   --   --   --  HGB 10.1*   < > 7.1* 5.6* 8.9* 8.9* 9.6* 8.7* 9.1*  HCT 31.0*   < > 21.9* 17.6* 26.6* 26.1* 28.0* 26.0* 27.1*  MCV 96.9   < > 98.6 100.0  --  92.2 89.7 92.5 92.8  PLT 169   < > 123* 90*  --  99* 119* 144* 186   < > = values in this interval not displayed.   Recent Labs  Lab 11/16/20 0550 11/17/20 0524 11/18/20 0511 11/19/20 0509 11/19/20 2321 11/20/20 0129  NA 134* 133* 133* 135 137 135  K 3.9 3.8 3.9 3.5 3.6 4.4  CL 107 106 103 104 104 103  CO2 23 20* 20* 23 24 26   GLUCOSE 106* 117* 146* 103* 122* 111*  BUN 19 11 12 16 16 17   CREATININE 0.77 0.63 0.67 0.55 0.61 0.58  CALCIUM 7.2* 7.4* 7.1* 7.3* 7.5* 7.3*  MG 2.2 2.1  --   --  2.4  --     F/u labs ordered  Signed, Terrilee Croak, MD [facility] 11/20/2020

## 2020-11-21 DIAGNOSIS — S72001A Fracture of unspecified part of neck of right femur, initial encounter for closed fracture: Secondary | ICD-10-CM | POA: Diagnosis not present

## 2020-11-21 NOTE — Progress Notes (Signed)
PROGRESS NOTE  [name]  DOB: 1931/12/16  PCP: Eulas Post, MD [id number]  DOA: 11/14/2020  LOS: 7 days   Chief Complaint  Patient presents with  . Fall    Brief narrative: [name] is a 87 y.o. female with PMH significant for hypertension, osteoporosis. Patient presented to the ED on 1/21 after a mechanical fall on ice. X-ray showed proximal right femur fracture Admitted to hospitalist service Orthopedics consulted. 1/24, patient underwent ORIF of right periprosthetic fracture. Patient's mobility has been very limited post surgically.  Patient and her husband have been unclear about the side of next disposition.  This morning, they made a decision to go to SNF.  Subjective: Patient was seen and examined this morning.  Elderly Caucasian female.   Lying down in bed.  Not in distress.  Pain controlled today.  Heart rate and blood pressure both controlled.  Assessment/Plan: Acute minimally displaced periprosthetic fracture in proximal shaft of right femur -1/24, patient underwent ORIF of right periprosthetic fracture. -Pain management with Norco as needed. -Per orthopedics team, DVT prophylaxis with aspirin 81 mg twice daily.  A. fib with RVR -New onset A. fib probably related to pain. -Controlled with resumption of bisoprolol.  Continue to monitor  Essential hypertension -Blood pressure stable on bisoprolol.  HCTZ on hold.  Acute kidney injury -Improved with IV fluid.  HCTZ on hold.   Recent Labs    08/05/20 1559 11/14/20 1403 11/15/20 0556 11/16/20 0550 11/17/20 0524 11/18/20 0511 11/19/20 0509 11/19/20 2321 11/20/20 0129  BUN 21 24* 28* 19 11 12 16 16 17   CREATININE 0.77 0.76 1.05* 0.77 0.63 0.67 0.55 0.61 0.58   Acute blood anemia -Hemoglobin level low at 7.3 at presentation, dipped down to 5.6. -CT scan with significant amount of hemorrhage at the fracture site. -2 units of PRBCs transfused.  Hemoglobin improved.  Stable  at 9.1 on last check on 1/27..  No active bleeding. Recent Labs    11/15/20 0747 11/16/20 0550 11/16/20 1955 11/17/20 0524 11/18/20 0511 11/19/20 0509 11/20/20 0129  HGB 7.1*   < > 8.9* 8.9* 9.6* 8.7* 9.1*  MCV 98.6   < >  --  92.2 89.7 92.5 92.8  VITAMINB12 631  --   --   --   --   --   --   FOLATE 30.8  --   --   --   --   --   --   FERRITIN 52  --   --   --   --   --   --   TIBC 260  --   --   --   --   --   --   IRON 54  --   --   --   --   --   --   RETICCTPCT 1.6  --   --   --   --   --   --    < > = values in this interval not displayed.   Osteoporosis Vitamin D deficiency Continue calcium and vitamin D supplementation.  Acute urinary retention. -Foley catheter on.  Mobility: PT evaluation pending. Code Status:   Code Status: Full Code  Nutritional status: Body mass index is 19.21 kg/m. Nutrition Problem: Increased nutrient needs Etiology: hip fracture Signs/Symptoms: estimated needs Diet Order            Diet regular Room service appropriate? Yes; Fluid consistency: Thin  Diet effective now  DVT prophylaxis: SCDs Start: 11/17/20 1941 Place TED hose Start: 11/17/20 1941 SCDs Start: 11/14/20 1658   Antimicrobials:  None Fluid: None IV fluid Consultants: Orthopedics Family Communication:  Husband not at bedside today  Status is: Inpatient   Remains inpatient appropriate because: Pending SNF.  Dispo: The patient is from: Home              Anticipated d/c is to: SNF              Anticipated d/c date is: Tomorrow most likely              Patient currently is medically stable to d/c.   Difficult to place patient No  Infusions:  . methocarbamol (ROBAXIN) IV 500 mg (11/17/20 1811)    Scheduled Meds: . aspirin EC  81 mg Oral BID  . bisoprolol  5 mg Oral Daily  . docusate sodium  100 mg Oral BID  . feeding supplement  237 mL Oral BID BM  . ferrous sulfate  325 mg Oral BID WC  . multivitamin with minerals  1 tablet Oral Daily  .  omega-3 acid ethyl esters  1 g Oral BID  . Vitamin D (Ergocalciferol)  50,000 Units Oral Q7 days    Antimicrobials: Anti-infectives (From admission, onward)   Start     Dose/Rate Route Frequency Ordered Stop   11/17/20 2200  ceFAZolin (ANCEF) IVPB 1 g/50 mL premix        1 g 100 mL/hr over 30 Minutes Intravenous Every 6 hours 11/17/20 1940 11/18/20 1034   11/17/20 1500  ceFAZolin (ANCEF) IVPB 2g/100 mL premix        2 g 200 mL/hr over 30 Minutes Intravenous On call to O.R. 11/17/20 1146 11/17/20 1625      PRN meds: acetaminophen, bisacodyl, diphenhydrAMINE, HYDROcodone-acetaminophen, HYDROcodone-acetaminophen, menthol-cetylpyridinium **OR** phenol, methocarbamol **OR** methocarbamol (ROBAXIN) IV, metoCLOPramide **OR** metoCLOPramide (REGLAN) injection, morphine injection, ondansetron **OR** ondansetron (ZOFRAN) IV, polyvinyl alcohol, senna-docusate   Objective: Vitals:   11/21/20 0221 11/21/20 0550  BP: 136/80 (!) 148/79  Pulse: 94 92  Resp: 14 14  Temp: 98 F (36.7 C) 97.7 F (36.5 C)  SpO2: 98% 94%    Intake/Output Summary (Last 24 hours) at 11/21/2020 1350 Last data filed at 11/21/2020 1255 Gross per 24 hour  Intake 760 ml  Output 1025 ml  Net -265 ml   Filed Weights   11/14/20 1215  Weight: 54 kg   Weight change:  Body mass index is 19.21 kg/m.   Physical Exam: General exam: Pleasant, elderly Caucasian female.  Not in distress.  Pain controlled Skin: No rashes, lesions or ulcers. HEENT: Atraumatic, normocephalic, no obvious bleeding Lungs: Clear to auscultation bilaterally CVS: Irregular rhythm, controlled rate, no murmur  GI/Abd soft, nontender, nondistended, bowel sound present CNS: Alert, awake, oriented x3 Psychiatry: Mood appropriate Extremities: No pedal edema, no calf tenderness  Data Review: I have personally reviewed the laboratory data and studies available.  Recent Labs  Lab 11/14/20 1403 11/15/20 0556 11/15/20 0747 11/16/20 0550  11/16/20 1955 11/17/20 0524 11/18/20 0511 11/19/20 0509 11/20/20 0129  WBC 20.0*   < > 8.6 7.6  --  9.4 10.9* 10.4 10.2  NEUTROABS 17.6*  --  5.5  --   --   --   --   --   --   HGB 10.1*   < > 7.1* 5.6* 8.9* 8.9* 9.6* 8.7* 9.1*  HCT 31.0*   < > 21.9* 17.6* 26.6* 26.1* 28.0* 26.0* 27.1*  MCV 96.9   < > 98.6 100.0  --  92.2 89.7 92.5 92.8  PLT 169   < > 123* 90*  --  99* 119* 144* 186   < > = values in this interval not displayed.   Recent Labs  Lab 11/16/20 0550 11/17/20 0524 11/18/20 0511 11/19/20 0509 11/19/20 2321 11/20/20 0129  NA 134* 133* 133* 135 137 135  K 3.9 3.8 3.9 3.5 3.6 4.4  CL 107 106 103 104 104 103  CO2 23 20* 20* 23 24 26   GLUCOSE 106* 117* 146* 103* 122* 111*  BUN 19 11 12 16 16 17   CREATININE 0.77 0.63 0.67 0.55 0.61 0.58  CALCIUM 7.2* 7.4* 7.1* 7.3* 7.5* 7.3*  MG 2.2 2.1  --   --  2.4  --     F/u labs ordered  Signed, Terrilee Croak, MD [facility] 11/21/2020

## 2020-11-21 NOTE — TOC Progression Note (Signed)
Transition of Care [facility]) - Progression Note    Patient Details  Name: [name] MRN: [id number] Date of Birth: 1932-10-03  Transition of [facility]) CM/SW Contact  Kaylenn Civil, Marjie Skiff, RN Phone Number: 11/21/2020, 1:22 PM  Clinical Narrative:    SNF bed offers given to pt and husband at bedside. [facility] chosen. Tracy at [facility] alerted of bed acceptance. Plan is for DC to SNF tomorrow. MD asked for Covid test for dc.    Expected Discharge Plan: [place] Barriers to Discharge: Barriers Resolved  Expected Discharge Plan and Services Expected Discharge Plan: [place] In-house Referral: Clinical Social Work     Living arrangements for the past 2 months: Single Family Home                 DME Arranged: N/A DME Agency: NA       HH Arranged: PT [place] Agency: [facility] Date [place]: 11/19/20 Time [place]: 1400 Representative spoke with at [place]: [place] ([place]) Interventions    Readmission Risk Interventions Readmission Risk Prevention Plan 11/19/2020  Post Dischage Appt Complete  Medication Screening Complete  Transportation Screening Complete  Some recent data might be hidden

## 2020-11-21 NOTE — Progress Notes (Signed)
Physical Therapy Treatment Patient Details Name: [name] MRN: [id number] DOB: 11-Dec-1931 Today's Date: 11/21/2020    History of Present Illness [name] is a 87 y.o. female with PMH significant for hypertension, osteoporosis, Right THA.  Patient presented to the ED on 1/21 after a mechanical fall on ice.  X-ray showed proximal right femur fracture  Admitted to hospitalist service  Orthopedics consulted.  1/24, patient underwent ORIF of right periprosthetic fracture.  Pt also diagnosed with New onset A. fib 11/20/20    PT Comments    Pt performed LE exercises and ambulated in hallway. Pt premedicated for session and reports pain improved.  Pt left in recliner and ice applied over hip incision.  Pt plans to d/c to SNF.   Follow Up Recommendations  SNF     Equipment Recommendations  None recommended by PT    Recommendations for Other Services       Precautions / Restrictions Precautions Precautions: Fall Restrictions Weight Bearing Restrictions: Yes RLE Weight Bearing: Partial weight bearing RLE Partial Weight Bearing Percentage or Pounds: 50    Mobility  Bed Mobility Overal bed mobility: Needs Assistance Bed Mobility: Supine to Sit     Supine to sit: Min guard        Transfers Overall transfer level: Needs assistance Equipment used: Rolling walker (2 wheeled) Transfers: Sit to/from Stand Sit to Stand: Min assist         General transfer comment: cues for UE and LE positioning for pain control, min assist for steadying and controlling descent; improved from higher surface  Ambulation/Gait Ambulation/Gait assistance: Min guard Gait Distance (Feet): 100 Feet Assistive device: Rolling walker (2 wheeled) Gait Pattern/deviations: Step-to pattern;Antalgic Gait velocity: decr   General Gait Details: cues for posture and RW positioning, pt denies symptoms today, HR 97 bpm during ambulation   Stairs             Wheelchair Mobility     Modified Rankin (Stroke Patients Only)       Balance                                            Cognition Arousal/Alertness: Awake/alert Behavior During Therapy: WFL for tasks assessed/performed Overall Cognitive Status: Within Functional Limits for tasks assessed                                        Exercises General Exercises - Lower Extremity Ankle Circles/Pumps: AROM;10 reps;Both Quad Sets: AROM;Both;10 reps Heel Slides: AAROM;Right;10 reps Hip ABduction/ADduction: AAROM;Right;10 reps    General Comments        Pertinent Vitals/Pain Pain Assessment: 0-10 Pain Score: 3  Pain Location: right thigh Pain Descriptors / Indicators: Sore;Aching Pain Intervention(s): Monitored during session;Repositioned;Premedicated before session    Home Living                      Prior Function            PT Goals (current goals can now be found in the care plan section) Progress towards PT goals: Progressing toward goals    Frequency    Min 3X/week      PT Plan Discharge plan needs to be updated;Frequency needs to be updated    Co-evaluation  AM-PAC PT "6 Clicks" Mobility   Outcome Measure  Help needed turning from your back to your side while in a flat bed without using bedrails?: A Little Help needed moving from lying on your back to sitting on the side of a flat bed without using bedrails?: A Little Help needed moving to and from a bed to a chair (including a wheelchair)?: A Little Help needed standing up from a chair using your arms (e.g., wheelchair or bedside chair)?: A Little Help needed to walk in hospital room?: A Little Help needed climbing 3-5 steps with a railing? : A Lot 6 Click Score: 17    End of Session Equipment Utilized During Treatment: Gait belt Activity Tolerance: Patient tolerated treatment well Patient left: with call bell/phone within reach;in chair;with chair alarm  set Nurse Communication: Mobility status PT Visit Diagnosis: Unsteadiness on feet (R26.81);Muscle weakness (generalized) (M62.81);Difficulty in walking, not elsewhere classified (R26.2)     Time: 7366-8159 PT Time Calculation (min) (ACUTE ONLY): 26 min  Charges:  $Gait Training: 8-22 mins $Therapeutic Exercise: 8-22 mins                    Jannette Spanner PT, DPT Acute Rehabilitation Services Pager: [phone] Office: [phone]  York Ram E 11/21/2020, 4:04 PM

## 2020-11-21 NOTE — Care Management Important Message (Signed)
Important Message  Patient Details IM Letter given to the Patient. Name: [name] MRN: [id number] Date of Birth: Jun 23, 1932   Medicare Important Message Given:  Yes     Kerin Salen 11/21/2020, 12:12 PM

## 2020-11-22 DIAGNOSIS — D500 Iron deficiency anemia secondary to blood loss (chronic): Secondary | ICD-10-CM | POA: Diagnosis not present

## 2020-11-22 DIAGNOSIS — E559 Vitamin D deficiency, unspecified: Secondary | ICD-10-CM | POA: Diagnosis not present

## 2020-11-22 DIAGNOSIS — Z471 Aftercare following joint replacement surgery: Secondary | ICD-10-CM | POA: Diagnosis not present

## 2020-11-22 DIAGNOSIS — M25551 Pain in right hip: Secondary | ICD-10-CM | POA: Diagnosis not present

## 2020-11-22 DIAGNOSIS — I10 Essential (primary) hypertension: Secondary | ICD-10-CM | POA: Diagnosis not present

## 2020-11-22 DIAGNOSIS — M978XXD Periprosthetic fracture around other internal prosthetic joint, subsequent encounter: Secondary | ICD-10-CM | POA: Diagnosis not present

## 2020-11-22 DIAGNOSIS — S72121A Displaced fracture of lesser trochanter of right femur, initial encounter for closed fracture: Secondary | ICD-10-CM | POA: Diagnosis not present

## 2020-11-22 DIAGNOSIS — Z87440 Personal history of urinary (tract) infections: Secondary | ICD-10-CM | POA: Diagnosis not present

## 2020-11-22 DIAGNOSIS — M810 Age-related osteoporosis without current pathological fracture: Secondary | ICD-10-CM | POA: Diagnosis not present

## 2020-11-22 DIAGNOSIS — K5790 Diverticulosis of intestine, part unspecified, without perforation or abscess without bleeding: Secondary | ICD-10-CM | POA: Diagnosis not present

## 2020-11-22 DIAGNOSIS — R600 Localized edema: Secondary | ICD-10-CM | POA: Diagnosis not present

## 2020-11-22 DIAGNOSIS — R52 Pain, unspecified: Secondary | ICD-10-CM | POA: Diagnosis not present

## 2020-11-22 DIAGNOSIS — E785 Hyperlipidemia, unspecified: Secondary | ICD-10-CM | POA: Diagnosis not present

## 2020-11-22 DIAGNOSIS — M1611 Unilateral primary osteoarthritis, right hip: Secondary | ICD-10-CM | POA: Diagnosis not present

## 2020-11-22 DIAGNOSIS — R278 Other lack of coordination: Secondary | ICD-10-CM | POA: Diagnosis not present

## 2020-11-22 DIAGNOSIS — Z7401 Bed confinement status: Secondary | ICD-10-CM | POA: Diagnosis not present

## 2020-11-22 DIAGNOSIS — M6281 Muscle weakness (generalized): Secondary | ICD-10-CM | POA: Diagnosis not present

## 2020-11-22 DIAGNOSIS — R69 Illness, unspecified: Secondary | ICD-10-CM | POA: Diagnosis not present

## 2020-11-22 DIAGNOSIS — S72351D Displaced comminuted fracture of shaft of right femur, subsequent encounter for closed fracture with routine healing: Secondary | ICD-10-CM | POA: Diagnosis not present

## 2020-11-22 DIAGNOSIS — R202 Paresthesia of skin: Secondary | ICD-10-CM | POA: Diagnosis present

## 2020-11-22 DIAGNOSIS — G5793 Unspecified mononeuropathy of bilateral lower limbs: Secondary | ICD-10-CM | POA: Diagnosis not present

## 2020-11-22 DIAGNOSIS — W000XXD Fall on same level due to ice and snow, subsequent encounter: Secondary | ICD-10-CM | POA: Diagnosis not present

## 2020-11-22 DIAGNOSIS — R41841 Cognitive communication deficit: Secondary | ICD-10-CM | POA: Diagnosis not present

## 2020-11-22 DIAGNOSIS — R2681 Unsteadiness on feet: Secondary | ICD-10-CM | POA: Diagnosis not present

## 2020-11-22 DIAGNOSIS — R208 Other disturbances of skin sensation: Secondary | ICD-10-CM | POA: Diagnosis not present

## 2020-11-22 DIAGNOSIS — S72001A Fracture of unspecified part of neck of right femur, initial encounter for closed fracture: Secondary | ICD-10-CM | POA: Diagnosis not present

## 2020-11-22 DIAGNOSIS — K589 Irritable bowel syndrome without diarrhea: Secondary | ICD-10-CM | POA: Diagnosis not present

## 2020-11-22 DIAGNOSIS — M1990 Unspecified osteoarthritis, unspecified site: Secondary | ICD-10-CM | POA: Diagnosis not present

## 2020-11-22 DIAGNOSIS — Z96642 Presence of left artificial hip joint: Secondary | ICD-10-CM | POA: Diagnosis not present

## 2020-11-22 DIAGNOSIS — M7989 Other specified soft tissue disorders: Secondary | ICD-10-CM | POA: Diagnosis not present

## 2020-11-22 DIAGNOSIS — Z7982 Long term (current) use of aspirin: Secondary | ICD-10-CM | POA: Diagnosis not present

## 2020-11-22 DIAGNOSIS — Z9181 History of falling: Secondary | ICD-10-CM | POA: Diagnosis not present

## 2020-11-22 DIAGNOSIS — R29898 Other symptoms and signs involving the musculoskeletal system: Secondary | ICD-10-CM | POA: Diagnosis not present

## 2020-11-22 DIAGNOSIS — I4891 Unspecified atrial fibrillation: Secondary | ICD-10-CM | POA: Diagnosis not present

## 2020-11-22 DIAGNOSIS — D649 Anemia, unspecified: Secondary | ICD-10-CM | POA: Diagnosis not present

## 2020-11-22 DIAGNOSIS — M2550 Pain in unspecified joint: Secondary | ICD-10-CM | POA: Diagnosis not present

## 2020-11-22 DIAGNOSIS — Z79899 Other long term (current) drug therapy: Secondary | ICD-10-CM | POA: Diagnosis not present

## 2020-11-22 DIAGNOSIS — R609 Edema, unspecified: Secondary | ICD-10-CM | POA: Diagnosis not present

## 2020-11-22 DIAGNOSIS — R339 Retention of urine, unspecified: Secondary | ICD-10-CM | POA: Diagnosis not present

## 2020-11-22 DIAGNOSIS — K5900 Constipation, unspecified: Secondary | ICD-10-CM | POA: Diagnosis not present

## 2020-11-22 DIAGNOSIS — Z4889 Encounter for other specified surgical aftercare: Secondary | ICD-10-CM | POA: Diagnosis not present

## 2020-11-22 DIAGNOSIS — R531 Weakness: Secondary | ICD-10-CM | POA: Diagnosis not present

## 2020-11-22 DIAGNOSIS — Z4789 Encounter for other orthopedic aftercare: Secondary | ICD-10-CM | POA: Diagnosis not present

## 2020-11-22 LAB — SARS CORONAVIRUS 2 (TAT 6-24 HRS): SARS Coronavirus 2: NEGATIVE

## 2020-11-22 LAB — SARS CORONAVIRUS 2 BY RT PCR (HOSPITAL ORDER, PERFORMED IN ~~LOC~~ HOSPITAL LAB): SARS Coronavirus 2: NEGATIVE

## 2020-11-22 MED ORDER — BISACODYL 10 MG RE SUPP: 10.0000 mg | Freq: Every day | RECTAL | 0 refills | Status: DC | PRN

## 2020-11-22 MED ORDER — ENSURE ENLIVE PO LIQD: 237.0000 mL | Freq: Two times a day (BID) | ORAL | 12 refills | Status: DC

## 2020-11-22 MED ORDER — METHOCARBAMOL 500 MG PO TABS: 500.0000 mg | ORAL_TABLET | Freq: Four times a day (QID) | ORAL | 0 refills | Status: AC | PRN

## 2020-11-22 MED ORDER — ASPIRIN 81 MG PO TBEC: 81.0000 mg | DELAYED_RELEASE_TABLET | Freq: Two times a day (BID) | ORAL | Status: AC

## 2020-11-22 MED ORDER — ASPIRIN 81 MG PO TBEC: 81.0000 mg | DELAYED_RELEASE_TABLET | Freq: Two times a day (BID) | ORAL | 11 refills | Status: DC

## 2020-11-22 MED ORDER — SENNOSIDES-DOCUSATE SODIUM 8.6-50 MG PO TABS: 1.0000 | ORAL_TABLET | Freq: Every evening | ORAL | Status: DC | PRN

## 2020-11-22 MED ORDER — HYDROCODONE-ACETAMINOPHEN 7.5-325 MG PO TABS: 1.0000 | ORAL_TABLET | Freq: Four times a day (QID) | ORAL | 0 refills | Status: AC | PRN

## 2020-11-22 MED ORDER — FERROUS SULFATE 325 (65 FE) MG PO TABS: 325.0000 mg | ORAL_TABLET | Freq: Every day | ORAL | 3 refills | Status: DC

## 2020-11-22 NOTE — Progress Notes (Signed)
Report called to Flora at [facility].

## 2020-11-22 NOTE — Progress Notes (Signed)
Orthopedics Progress Note  Subjective: Patient reports minimal pain but still complains of thigh swelling  Objective:  Vitals:   11/21/20 2003 11/22/20 0424  BP: 126/85 136/83  Pulse: 87 95  Resp: 14 14  Temp: 98.1 F (36.7 C) 97.6 F (36.4 C)  SpO2: 97% 99%    General: Awake and alert  Musculoskeletal: moderated expected swelling in the upper thigh. Incision looks good and Aquacel dressing in place. Compartments supple. No pain with active ankle pumps Neurovascularly intact  Lab Results  Component Value Date   WBC 10.2 11/20/2020   HGB 9.1 (L) 11/20/2020   HCT 27.1 (L) 11/20/2020   MCV 92.8 11/20/2020   PLT 186 11/20/2020       Component Value Date/Time   NA 135 11/20/2020 0129   K 4.4 11/20/2020 0129   CL 103 11/20/2020 0129   CO2 26 11/20/2020 0129   GLUCOSE 111 (H) 11/20/2020 0129   BUN 17 11/20/2020 0129   CREATININE 0.58 11/20/2020 0129   CREATININE 0.77 08/05/2020 1559   CALCIUM 7.3 (L) 11/20/2020 0129   GFRNONAA >60 11/20/2020 0129   GFRAA 105 09/04/2007 1038    Lab Results  Component Value Date   INR 1.2 11/15/2020    Assessment/Plan:  s/p Procedure(s): ORIF of Right periprosthetic fracture Stable from ortho standpoint. Continue PT and mobilization Discharge to SNF planned for today  Doran Heater. Veverly Fells, MD 11/22/2020 8:18 AM

## 2020-11-22 NOTE — Progress Notes (Signed)
Physical Therapy Treatment Patient Details Name: [name] MRN: [id number] DOB: [birth year] Today's Date: 11/22/2020    History of Present Illness [name] is a 87 y.o. female with PMH significant for hypertension, osteoporosis, Right THA.  Patient presented to the ED on 1/21 after a mechanical fall on ice.  X-ray showed proximal right femur fracture  Admitted to hospitalist service  Orthopedics consulted.  1/24, patient underwent ORIF of right periprosthetic fracture.  Pt also diagnosed with New onset A. fib 11/20/20    PT Comments    Pt ambulated in hallway and set up in recliner end of session.  Pt feels ready to d/c to rehab in next step to getting home.  Follow Up Recommendations  SNF     Equipment Recommendations  None recommended by PT    Recommendations for Other Services       Precautions / Restrictions Precautions Precautions: Fall Restrictions Weight Bearing Restrictions: Yes RLE Weight Bearing: Partial weight bearing RLE Partial Weight Bearing Percentage or Pounds: 50    Mobility  Bed Mobility Overal bed mobility: Needs Assistance Bed Mobility: Supine to Sit     Supine to sit: Min guard Sit to supine: Min guard      Transfers Overall transfer level: Needs assistance Equipment used: Rolling walker (2 wheeled) Transfers: Sit to/from Stand Sit to Stand: Min assist         General transfer comment: cues for UE and LE positioning for pain control, min assist for steadying and controlling descent  Ambulation/Gait Ambulation/Gait assistance: Min guard Gait Distance (Feet): 120 Feet Assistive device: Rolling walker (2 wheeled) Gait Pattern/deviations: Step-to pattern;Antalgic Gait velocity: decr   General Gait Details: cues for posture and RW positioning, pt denies symptoms today, HR 104 bpm during ambulation   Stairs             Wheelchair Mobility    Modified Rankin (Stroke Patients Only)       Balance                                             Cognition Arousal/Alertness: Awake/alert Behavior During Therapy: WFL for tasks assessed/performed Overall Cognitive Status: Within Functional Limits for tasks assessed                                        Exercises General Exercises - Lower Extremity Long Arc Quad: AROM;Right;10 reps;Seated    General Comments        Pertinent Vitals/Pain Pain Assessment: 0-10 Pain Score: 5  Pain Location: right thigh Pain Descriptors / Indicators: Sore;Aching Pain Intervention(s): Repositioned;Monitored during session;Premedicated before session    Home Living                      Prior Function            PT Goals (current goals can now be found in the care plan section) Progress towards PT goals: Progressing toward goals    Frequency    Min 3X/week      PT Plan Current plan remains appropriate    Co-evaluation              AM-PAC PT "6 Clicks" Mobility   Outcome Measure  Help needed turning from your back to your  side while in a flat bed without using bedrails?: A Little Help needed moving from lying on your back to sitting on the side of a flat bed without using bedrails?: A Little Help needed moving to and from a bed to a chair (including a wheelchair)?: A Little Help needed standing up from a chair using your arms (e.g., wheelchair or bedside chair)?: A Little Help needed to walk in hospital room?: A Little Help needed climbing 3-5 steps with a railing? : A Lot 6 Click Score: 17    End of Session Equipment Utilized During Treatment: Gait belt Activity Tolerance: Patient tolerated treatment well Patient left: with call bell/phone within reach;in chair;with chair alarm set   PT Visit Diagnosis: Unsteadiness on feet (R26.81);Muscle weakness (generalized) (M62.81);Difficulty in walking, not elsewhere classified (R26.2)     Time: 2836-6294 PT Time Calculation (min) (ACUTE ONLY):  18 min  Charges:  $Gait Training: 8-22 mins                     Jannette Spanner PT, DPT Acute Rehabilitation Services Pager: [phone] Office: [phone]   York Ram E 11/22/2020, 12:59 PM

## 2020-11-22 NOTE — Progress Notes (Signed)
Pt discharged to [facility] facility via [place]. Report already called to facility. No immediate questions or concerns.

## 2020-11-22 NOTE — Discharge Summary (Addendum)
Physician Discharge Summary  [name] [id number] DOB: 05/27/1932 DOA: 11/14/2020  PCP: Eulas Post, MD  Admit date: 11/14/2020 Discharge date: 11/22/2020  Admitted From: Home Discharge disposition: SNF   Code Status: Full Code  Diet Recommendation: Cardiac diet  Discharge Diagnosis:   Active Problems:   Hip fracture [facility])  Chief Complaint  Patient presents with  . Fall    Brief narrative: [name] is a 87 y.o. female with PMH significant for hypertension, osteoporosis. Patient presented to the ED on 1/21 after a mechanical fall on ice. X-ray showed proximal right femur fracture Admitted to hospitalist service Orthopedics consulted. 1/24, patient underwent ORIF of right periprosthetic fracture. Seen by PT postprocedure.  Patient and family chose discharge to SNF because of inadequate support at home.  Subjective: Patient was seen and examined this morning.  Elderly Caucasian female.  Not in distress.  Pain controlled.  Assessment/Plan: Acute minimally displaced periprosthetic fracture in proximal shaft of right femur -1/24, patient underwent ORIF of right periprosthetic fracture. -Pain management with Norco as needed. -Per orthopedics team, DVT prophylaxis with aspirin 81 mg twice daily.  A. fib with RVR -New onset A. fib probably related to pain. -Controlled with resumption of bisoprolol.  Continue to monitor  Essential hypertension -Continue bisoprolol.  Resume HCTZ post discharge.  Acute kidney injury -Improved with IV fluid.   Recent Labs    08/05/20 1559 11/14/20 1403 11/15/20 0556 11/16/20 0550 11/17/20 0524 11/18/20 0511 11/19/20 0509 11/19/20 2321 11/20/20 0129  BUN 21 24* 28* 19 11 12 16 16 17   CREATININE 0.77 0.76 1.05* 0.77 0.63 0.67 0.55 0.61 0.58   Acute blood anemia Iron deficiency anemia -Hemoglobin level low at 7.3 at presentation, dipped down to 5.6. -CT scan with significant amount of hemorrhage  at the fracture site. -2 units of PRBCs transfused.  Hemoglobin improved.  Stable at 9.1 on last check on 1/27..  No active bleeding. -Ferritin level low at 52.  Oral iron replacement to continue at discharge Recent Labs    11/15/20 0747 11/16/20 0550 11/16/20 1955 11/17/20 0524 11/18/20 0511 11/19/20 0509 11/20/20 0129  HGB 7.1*   < > 8.9* 8.9* 9.6* 8.7* 9.1*  MCV 98.6   < >  --  92.2 89.7 92.5 92.8  VITAMINB12 631  --   --   --   --   --   --   FOLATE 30.8  --   --   --   --   --   --   FERRITIN 52  --   --   --   --   --   --   TIBC 260  --   --   --   --   --   --   IRON 54  --   --   --   --   --   --   RETICCTPCT 1.6  --   --   --   --   --   --    < > = values in this interval not displayed.   Osteoporosis Vitamin D deficiency Continue calcium and vitamin D supplementation.  Acute urinary retention. -Foley catheter on.   Wound care: Incision (Closed) 11/17/20 Hip Right (Active)  Date First Assessed/Time First Assessed: 11/17/20 1721   Location: Hip  Location Orientation: Right    Assessments 11/17/2020  6:00 PM 11/21/2020  7:38 AM  Dressing Type -- Silver hydrofiber  Dressing Clean;Dry;Intact Clean;Dry;Intact  Site / Wound Assessment  Dressing in place / Unable to assess;Clean;Dry Dressing in place / Unable to assess  Drainage Amount None None  Treatment Ice applied --     No Linked orders to display    Discharge Exam:   Vitals:   11/21/20 0550 11/21/20 1500 11/21/20 2003 11/22/20 0424  BP: (!) 148/79 123/71 126/85 136/83  Pulse: 92 79 87 95  Resp: 14  14 14   Temp: 97.7 F (36.5 C) 97.9 F (36.6 C) 98.1 F (36.7 C) 97.6 F (36.4 C)  TempSrc: Oral Oral Oral Oral  SpO2: 94% 100% 97% 99%  Weight:      Height:        Body mass index is 19.21 kg/m.  General exam: Pleasant, elderly Caucasian female. Skin: No rashes, lesions or ulcers. HEENT: Atraumatic, normocephalic, no obvious bleeding Lungs: Clear to auscultation bilaterally CVS: Regular rate and  rhythm, no murmur GI/Abd soft, nontender, nondistended, bowel sound present CNS: Alert, awake, oriented x3 Psychiatry: Mood appropriate Extremities: No pedal edema, no calf tenderness  Follow ups:   Discharge Instructions    Discharge wound care:   Complete by: As directed    Reinforce dressing  Until discontinued   Increase activity slowly   Complete by: As directed       Follow-up Information    Paralee Cancel, MD Follow up in 2 week(s).   Specialty: Orthopedic Surgery Why: wound check, X-rays Contact information: [address] [place] [address] [phone]        [facility] Follow up.   Specialty: Home Health Services Why: to provide home heatlh physical therapy Contact information: [address] [place] [address] [phone]        Eulas Post, MD Follow up.   Specialty: Family Medicine Contact information: [place] [address] [phone]               Recommendations for Outpatient Follow-Up:   1. Follow-up with PCP as an outpatient 2. Follow-up with orthopedics on outpatient  Discharge Instructions:  Follow with Primary MD Eulas Post, MD in 7 days   Get CBC/BMP checked in next visit within 1 week by PCP or SNF MD ( we routinely change or add medications that can affect your baseline labs and fluid status, therefore we recommend that you get the mentioned basic workup next visit with your PCP, your PCP may decide not to get them or add new tests based on their clinical decision)  On your next visit with your PCP, please Get Medicines reviewed and adjusted.  Please request your PCP  to go over all Hospital Tests and Procedure/Radiological results at the follow up, please get all Hospital records sent to your Prim MD by signing hospital release before you go home.  Activity: As tolerated with Full fall precautions use walker/cane & assistance as needed  For Heart  failure patients - Check your Weight same time everyday, if you gain over 2 pounds, or you develop in leg swelling, experience more shortness of breath or chest pain, call your Primary MD immediately. Follow Cardiac Low Salt Diet and 1.5 lit/day fluid restriction.  If you have smoked or chewed Tobacco in the last 2 yrs please stop smoking, stop any regular Alcohol  and or any Recreational drug use.  If you experience worsening of your admission symptoms, develop shortness of breath, life threatening emergency, suicidal or homicidal thoughts you must seek medical attention immediately by calling 911 or  calling your MD immediately  if symptoms less severe.  You Must read complete instructions/literature along with all the possible adverse reactions/side effects for all the Medicines you take and that have been prescribed to you. Take any new Medicines after you have completely understood and accpet all the possible adverse reactions/side effects.   Do not drive, operate heavy machinery, perform activities at heights, swimming or participation in water activities or provide baby sitting services if your were admitted for syncope or siezures until you have seen by Primary MD or a Neurologist and advised to do so again.  Do not drive when taking Pain medications.  Do not take more than prescribed Pain, Sleep and Anxiety Medications  Wear Seat belts while driving.   Please note You were cared for by a hospitalist during your hospital stay. If you have any questions about your discharge medications or the care you received while you were in the hospital after you are discharged, you can call the unit and asked to speak with the hospitalist on call if the hospitalist that took care of you is not available. Once you are discharged, your primary care physician will handle any further medical issues. Please note that NO REFILLS for any discharge medications will be authorized once you are discharged, as it is  imperative that you return to your primary care physician (or establish a relationship with a primary care physician if you do not have one) for your aftercare needs so that they can reassess your need for medications and monitor your lab values.    Allergies as of 11/22/2020      Reactions   Codeine    REACTION: headaches   Flagyl [metronidazole Hcl]    nausea      Medication List    STOP taking these medications   ibuprofen 200 MG tablet Commonly known as: ADVIL     TAKE these medications   aspirin 81 MG EC tablet Take 1 tablet (81 mg total) by mouth 2 (two) times daily. Resume aspirin 81 mg daily after 30 days.   b complex vitamins tablet Take 1 tablet by mouth daily.   bisacodyl 10 MG suppository Commonly known as: DULCOLAX Place 1 suppository (10 mg total) rectally daily as needed for moderate constipation.   bisoprolol-hydrochlorothiazide 5-6.25 MG tablet Commonly known as: ZIAC Take 1 tablet by mouth daily.   Citracal Plus Tabs Take 1 tablet by mouth daily.   feeding supplement Liqd Take 237 mLs by mouth 2 (two) times daily between meals.   ferrous sulfate 325 (65 FE) MG tablet Take 1 tablet (325 mg total) by mouth daily with breakfast.   Fish Oil 1000 MG Caps Take 2 capsules (2,000 mg total) by mouth 2 (two) times daily.   Glucosamine-Chondroitin 250-200 MG Tabs Take 1 tablet by mouth daily.   HYDROcodone-acetaminophen 7.5-325 MG tablet Commonly known as: NORCO Take 1 tablet by mouth every 6 (six) hours as needed for up to 3 days for severe pain (pain score 7-10).   methocarbamol 500 MG tablet Commonly known as: ROBAXIN Take 1 tablet (500 mg total) by mouth every 6 (six) hours as needed for up to 3 days for muscle spasms.   polyvinyl alcohol 1.4 % ophthalmic solution Commonly known as: LIQUIFILM TEARS Place 1 drop into both eyes as needed for dry eyes.   senna-docusate 8.6-50 MG tablet Commonly known as: Senokot-S Take 1 tablet by mouth at bedtime  as needed for mild constipation.   Viactiv Calcium Plus D  650-12.5-40 MG-MCG-MCG Chew Generic drug: Calcium-Vitamin D-Vitamin K As directed   Vitamin D (Ergocalciferol) 1.25 MG (50000 UNIT) Caps capsule Commonly known as: DRISDOL Take 1 capsule (50,000 Units total) by mouth every 7 (seven) days.            Discharge Care Instructions  (From admission, onward)         Start     Ordered   11/22/20 0000  Discharge wound care:       Comments: Reinforce dressing  Until discontinued   11/22/20 1007          Time coordinating discharge: 35 minutes  The results of significant diagnostics from this hospitalization (including imaging, microbiology, ancillary and laboratory) are listed below for reference.    Procedures and Diagnostic Studies:   DG CHEST PORT 1 VIEW  Result Date: 11/15/2020 CLINICAL DATA:  Hip fracture.  Preop EXAM: PORTABLE CHEST 1 VIEW COMPARISON:  None. FINDINGS: Costochondral calcification. No superimposed infiltrate, edema, effusion, or pneumothorax. Normal heart size and mediastinal contours. IMPRESSION: No evidence of active disease. Electronically Signed   By: Monte Fantasia M.D.   On: 11/15/2020 08:51   DG Hip Unilat  With Pelvis 2-3 Views Right  Result Date: 11/14/2020 CLINICAL DATA:  Fall with right hip pain EXAM: DG HIP (WITH OR WITHOUT PELVIS) 2-3V RIGHT COMPARISON:  06/12/2008 right hip radiographs FINDINGS: Status post right hip hemiarthroplasty. New periprosthetic fracture in proximal shaft and metaphysis of right femur with minimal 4 mm lateral displacement of the dominant lateral fracture fragment. No dislocation at the right hip. No hardware fracture. No additional bone fracture. No focal osseous lesions. No pelvic diastasis. Degenerative changes in the visualized lower lumbar spine. IMPRESSION: Right hip hemiarthroplasty. Acute minimally displaced periprosthetic fracture in the proximal shaft and metaphysis of the right femur. No hardware fracture.  No right hip dislocation. Electronically Signed   By: Ilona Sorrel M.D.   On: 11/14/2020 12:51     Labs:   Basic Metabolic Panel: Recent Labs  Lab 11/16/20 0550 11/17/20 0524 11/18/20 0511 11/19/20 0509 11/19/20 2321 11/20/20 0129  NA 134* 133* 133* 135 137 135  K 3.9 3.8 3.9 3.5 3.6 4.4  CL 107 106 103 104 104 103  CO2 23 20* 20* 23 24 26   GLUCOSE 106* 117* 146* 103* 122* 111*  BUN 19 11 12 16 16 17   CREATININE 0.77 0.63 0.67 0.55 0.61 0.58  CALCIUM 7.2* 7.4* 7.1* 7.3* 7.5* 7.3*  MG 2.2 2.1  --   --  2.4  --    GFR Estimated Creatinine Clearance: 43 mL/min (by C-G formula based on SCr of 0.58 mg/dL). Liver Function Tests: No results for input(s): AST, ALT, ALKPHOS, BILITOT, PROT, ALBUMIN in the last 168 hours. No results for input(s): LIPASE, AMYLASE in the last 168 hours. No results for input(s): AMMONIA in the last 168 hours. Coagulation profile No results for input(s): INR, PROTIME in the last 168 hours.  CBC: Recent Labs  Lab 11/16/20 0550 11/16/20 1955 11/17/20 0524 11/18/20 0511 11/19/20 0509 11/20/20 0129  WBC 7.6  --  9.4 10.9* 10.4 10.2  HGB 5.6* 8.9* 8.9* 9.6* 8.7* 9.1*  HCT 17.6* 26.6* 26.1* 28.0* 26.0* 27.1*  MCV 100.0  --  92.2 89.7 92.5 92.8  PLT 90*  --  99* 119* 144* 186   Cardiac Enzymes: No results for input(s): CKTOTAL, CKMB, CKMBINDEX, TROPONINI in the last 168 hours. BNP: Invalid input(s): POCBNP CBG: No results for input(s): GLUCAP in the last 168  hours. D-Dimer No results for input(s): DDIMER in the last 72 hours. Hgb A1c No results for input(s): HGBA1C in the last 72 hours. Lipid Profile No results for input(s): CHOL, HDL, LDLCALC, TRIG, CHOLHDL, LDLDIRECT in the last 72 hours. Thyroid function studies No results for input(s): TSH, T4TOTAL, T3FREE, THYROIDAB in the last 72 hours.  Invalid input(s): FREET3 Anemia work up No results for input(s): VITAMINB12, FOLATE, FERRITIN, TIBC, IRON, RETICCTPCT in the last 72  hours. Microbiology Recent Results (from the past 240 hour(s))  SARS CORONAVIRUS 2 (TAT 6-24 HRS) Nasopharyngeal Nasopharyngeal Swab     Status: None   Collection Time: 11/14/20  2:03 PM   Specimen: Nasopharyngeal Swab  Result Value Ref Range Status   SARS Coronavirus 2 NEGATIVE NEGATIVE Final    Comment: (NOTE) SARS-CoV-2 target nucleic acids are NOT DETECTED.  The SARS-CoV-2 RNA is generally detectable in upper and lower respiratory specimens during the acute phase of infection. Negative results do not preclude SARS-CoV-2 infection, do not rule out co-infections with other pathogens, and should not be used as the sole basis for treatment or other patient management decisions. Negative results must be combined with clinical observations, patient history, and epidemiological information. The expected result is Negative.  Fact Sheet for Patients: SugarRoll.be  Fact Sheet for Healthcare Providers: https://www.woods-mathews.com/  This test is not yet approved or cleared by the [place] FDA and  has been authorized for detection and/or diagnosis of SARS-CoV-2 by FDA under an Emergency Use Authorization (EUA). This EUA will remain  in effect (meaning this test can be used) for the duration of the COVID-19 declaration under Se ction 564(b)(1) of the Act, 21 U.S.C. section 360bbb-3(b)(1), unless the authorization is terminated or revoked sooner.  Performed at [facility], [place] [address] [place], [place] [address]   Surgical pcr screen     Status: None   Collection Time: 11/17/20 12:02 PM   Specimen: Nasal Mucosa; Nasal Swab  Result Value Ref Range Status   MRSA, PCR NEGATIVE NEGATIVE Final   Staphylococcus aureus NEGATIVE NEGATIVE Final    Comment: (NOTE) The Xpert SA Assay (FDA approved for NASAL specimens in patients 61 years of age and older), is one component of a comprehensive surveillance program. It is not intended to  diagnose infection nor to guide or monitor treatment. Performed at [facility], [place] [address], [place], [place] [address]   SARS Coronavirus 2 by RT PCR (hospital order, performed in [facility] hospital lab) Nasopharyngeal Nasopharyngeal Swab     Status: None   Collection Time: 11/22/20  9:56 AM   Specimen: Nasopharyngeal Swab  Result Value Ref Range Status   SARS Coronavirus 2 NEGATIVE NEGATIVE Final    Comment: (NOTE) SARS-CoV-2 target nucleic acids are NOT DETECTED.  The SARS-CoV-2 RNA is generally detectable in upper and lower respiratory specimens during the acute phase of infection. The lowest concentration of SARS-CoV-2 viral copies this assay can detect is 250 copies / mL. A negative result does not preclude SARS-CoV-2 infection and should not be used as the sole basis for treatment or other patient management decisions.  A negative result may occur with improper specimen collection / handling, submission of specimen other than nasopharyngeal swab, presence of viral mutation(s) within the areas targeted by this assay, and inadequate number of viral copies (<250 copies / mL). A negative result must be combined with clinical observations, patient history, and epidemiological information.  Fact Sheet for Patients:   StrictlyIdeas.no  Fact Sheet  for Healthcare Providers: BankingDealers.co.za  This test is not yet approved or  cleared by the [place] and has been authorized for detection and/or diagnosis of SARS-CoV-2 by FDA under an Emergency Use Authorization (EUA).  This EUA will remain in effect (meaning this test can be used) for the duration of the COVID-19 declaration under Section 564(b)(1) of the Act, 21 U.S.C. section 360bbb-3(b)(1), unless the authorization is terminated or revoked sooner.  Performed at [facility], [place] [address] [place], [place] [address]       Signed: Marlowe Aschoff Gloristine Turrubiates  [facility] 11/22/2020, 11:26 AM

## 2020-11-22 NOTE — TOC Transition Note (Signed)
Transition of Care [facility]) - CM/SW Discharge Note   Patient Details  Name: [name] MRN: [id number] Date of Birth: November 16, 1931  Transition of Care [facility]) CM/SW Contact:  Elliot Gurney [place], [place] Phone Number: 11/22/2020, 11:40 AM   Clinical Narrative:    Patient to discharge today to [place]. Patient to be transported by [facility]. Patient to go to room 106. RN to call report to [phone]. Discharge summary provided to [facility], intake coordinator.  [place], LCSW Transition of Care [phone]   Final next level of care: [place] Barriers to Discharge: Barriers Resolved   Patient Goals and CMS Choice Patient states their goals for this hospitalization and ongoing recovery are:: go home      Discharge Placement                       Discharge Plan and Services In-house Referral: Clinical Social Work              DME Arranged: N/A DME Agency: NA       HH Arranged: PT [place] Agency: [facility] Date [place] [facility] Agency Contacted: 11/19/20 Time [place]: 1400 Representative spoke with at [place]: [place]) Interventions     Readmission Risk Interventions Readmission Risk Prevention Plan 11/19/2020  Post Dischage Appt Complete  Medication Screening Complete  Transportation Screening Complete  Some recent data might be hidden

## 2020-11-23 DIAGNOSIS — D649 Anemia, unspecified: Secondary | ICD-10-CM | POA: Diagnosis not present

## 2020-11-23 DIAGNOSIS — S72001A Fracture of unspecified part of neck of right femur, initial encounter for closed fracture: Secondary | ICD-10-CM | POA: Diagnosis not present

## 2020-11-23 DIAGNOSIS — R600 Localized edema: Secondary | ICD-10-CM | POA: Diagnosis not present

## 2020-11-24 ENCOUNTER — Other Ambulatory Visit: Payer: Self-pay | Admitting: *Deleted

## 2020-11-24 NOTE — Patient Outreach (Signed)
Member screened for potential [facility] Care Management needs.  Mrs. Haft is receiving skilled therapy at [facility].   Spoke with [facility] SW who indicates member is from home with spouse. She was active with [facility] home health prior. Member recently admitted to [facility].  Will continue to follow transition plans and for potential [facility] needs while member resides in SNF.    Marthenia Rolling, MSN, RN,BSN [place] Acute Care Coordinator [phone] [facility]) [phone]  (Toll free office)    Marthenia Rolling, MSN, RN,BSN [place] Acute Care Coordinator [phone] [facility]) [phone]  (Toll free office)

## 2020-11-28 ENCOUNTER — Emergency Department (HOSPITAL_COMMUNITY): Payer: Medicare Other

## 2020-11-28 ENCOUNTER — Encounter (HOSPITAL_COMMUNITY): Payer: Self-pay | Admitting: Emergency Medicine

## 2020-11-28 ENCOUNTER — Other Ambulatory Visit: Payer: Self-pay

## 2020-11-28 ENCOUNTER — Emergency Department (HOSPITAL_BASED_OUTPATIENT_CLINIC_OR_DEPARTMENT_OTHER): Payer: Medicare Other

## 2020-11-28 ENCOUNTER — Emergency Department (HOSPITAL_COMMUNITY)
Admission: EM | Admit: 2020-11-28 | Discharge: 2020-11-28 | Disposition: A | Discharge status: Home / Self Care | Payer: Medicare Other | Attending: Emergency Medicine | Admitting: Emergency Medicine

## 2020-11-28 DIAGNOSIS — M7989 Other specified soft tissue disorders: Secondary | ICD-10-CM

## 2020-11-28 DIAGNOSIS — Z7982 Long term (current) use of aspirin: Secondary | ICD-10-CM | POA: Diagnosis not present

## 2020-11-28 DIAGNOSIS — M1611 Unilateral primary osteoarthritis, right hip: Secondary | ICD-10-CM | POA: Diagnosis not present

## 2020-11-28 DIAGNOSIS — I10 Essential (primary) hypertension: Secondary | ICD-10-CM | POA: Insufficient documentation

## 2020-11-28 DIAGNOSIS — R202 Paresthesia of skin: Secondary | ICD-10-CM | POA: Diagnosis present

## 2020-11-28 DIAGNOSIS — Z96642 Presence of left artificial hip joint: Secondary | ICD-10-CM | POA: Diagnosis not present

## 2020-11-28 DIAGNOSIS — Z79899 Other long term (current) drug therapy: Secondary | ICD-10-CM | POA: Insufficient documentation

## 2020-11-28 DIAGNOSIS — G5793 Unspecified mononeuropathy of bilateral lower limbs: Secondary | ICD-10-CM | POA: Insufficient documentation

## 2020-11-28 DIAGNOSIS — R208 Other disturbances of skin sensation: Secondary | ICD-10-CM | POA: Diagnosis not present

## 2020-11-28 DIAGNOSIS — Z471 Aftercare following joint replacement surgery: Secondary | ICD-10-CM | POA: Diagnosis not present

## 2020-11-28 DIAGNOSIS — S72121A Displaced fracture of lesser trochanter of right femur, initial encounter for closed fracture: Secondary | ICD-10-CM | POA: Diagnosis not present

## 2020-11-28 LAB — BASIC METABOLIC PANEL
Anion gap: 14 (ref 5–15)
BUN: 26 mg/dL — ABNORMAL HIGH (ref 8–23)
CO2: 21 mmol/L — ABNORMAL LOW (ref 22–32)
Calcium: 8.5 mg/dL — ABNORMAL LOW (ref 8.9–10.3)
Chloride: 103 mmol/L (ref 98–111)
Creatinine, Ser: 0.56 mg/dL (ref 0.44–1.00)
GFR, Estimated: >60 mL/min (ref >60)
Glucose, Bld: 99 mg/dL (ref 70–99)
Potassium: 3.7 mmol/L (ref 3.5–5.1)
Sodium: 138 mmol/L (ref 135–145)

## 2020-11-28 LAB — CBC WITH DIFFERENTIAL/PLATELET
Abs Immature Granulocytes: 0.14 10*3/uL — ABNORMAL HIGH (ref 0.00–0.07)
Basophils Absolute: 0.1 10*3/uL (ref 0.0–0.1)
Basophils Relative: 1 %
Eosinophils Absolute: 0.1 10*3/uL (ref 0.0–0.5)
Eosinophils Relative: 1 %
HCT: 28.7 % — ABNORMAL LOW (ref 36.0–46.0)
Hemoglobin: 9.4 g/dL — ABNORMAL LOW (ref 12.0–15.0)
Immature Granulocytes: 1 %
Lymphocytes Relative: 16 %
Lymphs Abs: 1.9 10*3/uL (ref 0.7–4.0)
MCH: 31.1 pg (ref 26.0–34.0)
MCHC: 32.8 g/dL (ref 30.0–36.0)
MCV: 95 fL (ref 80.0–100.0)
Monocytes Absolute: 0.7 10*3/uL (ref 0.1–1.0)
Monocytes Relative: 6 %
Neutro Abs: 9.1 10*3/uL — ABNORMAL HIGH (ref 1.7–7.7)
Neutrophils Relative %: 75 %
Platelets: 399 10*3/uL (ref 150–400)
RBC: 3.02 MIL/uL — ABNORMAL LOW (ref 3.87–5.11)
RDW: 15.4 % (ref 11.5–15.5)
WBC: 12.1 10*3/uL — ABNORMAL HIGH (ref 4.0–10.5)
nRBC: 0 % (ref 0.0–0.2)

## 2020-11-28 LAB — MAGNESIUM: Magnesium: 2.1 mg/dL (ref 1.7–2.4)

## 2020-11-28 LAB — D-DIMER, QUANTITATIVE: D-Dimer, Quant: 14.07 ug/mL-FEU — ABNORMAL HIGH (ref 0.00–0.50)

## 2020-11-28 MED ORDER — ADULT MULTIVITAMIN W/MINERALS CH: 2.0000 | ORAL_TABLET | Freq: Once | ORAL | Status: DC

## 2020-11-28 MED ORDER — HYDROCODONE-ACETAMINOPHEN 5-325 MG PO TABS
1.0000 | ORAL_TABLET | Freq: Once | ORAL | Status: AC
Start: 2020-11-28 — End: 2020-11-28
  Administered 2020-11-28: 1 via ORAL
  Filled 2020-11-28: qty 1

## 2020-11-28 NOTE — ED Notes (Signed)
Patient in room 29 for procedure

## 2020-11-28 NOTE — Progress Notes (Signed)
Right lower extremity venous duplex has been completed. Preliminary results can be found in CV Proc through chart review.  Results were given to Dr. Melina Copa.  11/28/20 9:28 AM [name] RVT

## 2020-11-28 NOTE — ED Notes (Signed)
[facility] contacted regarding patient transport.  

## 2020-11-28 NOTE — ED Provider Notes (Signed)
[facility] DEPT Provider Note: [name], MD, FACEP  CSN: [id number] MRN: [id number] ARRIVAL: 11/28/20 at 0454 ROOM: [place]  Numbness   HISTORY OF PRESENT ILLNESS  11/28/20 5:02 AM [name] is a 87 y.o. female who had an ORIF of a right periprosthetic hip fracture on 11/17/2020.  She states her postoperative course has been unremarkable and she has been able to ambulate and bear weight on that right hip.  She still has edema of the right lower extremity.  She is here because of new onset paresthesias of both feet.  This is begun over the last several hours.  They are not completely insensate but feel at least partially numb.  She also had a shooting pain from her right hip down to her right foot earlier that has resolved.  That was fairly severe.  Nothing makes the numbness better or worse.   Past Medical History:  Diagnosis Date  . Constipation   . Diverticulosis   . Hypertension   . Inguinal hernia, left   . Irritable bowel syndrome   . Osteoporosis   . UTI (lower urinary tract infection)    hx of frequent uti's    Past Surgical History:  Procedure Laterality Date  . APPENDECTOMY    . BREAST SURGERY Left    biopsy  . CERVICAL FUSION    . colon polyps    . HEMORRHOID SURGERY    . HIP SURGERY Right   . INGUINAL HERNIA REPAIR Left 01/13/2017   Procedure: LEFT INGUINAL HERNIA REPAIR WITH MESH;  Surgeon: Jackolyn Confer, MD;  Location: [facility];  Service: General;  Laterality: Left;  . INSERTION OF MESH Left 01/13/2017   Procedure: INSERTION OF MESH;  Surgeon: Jackolyn Confer, MD;  Location: [facility];  Service: General;  Laterality: Left;  . LUMBAR FUSION    . LUMBAR LAMINECTOMY    . partial resection of colon for tumor    . SHOULDER SURGERY Left   . TOTAL HIP ARTHROPLASTY     right  . TOTAL HIP REVISION Right 11/17/2020   Procedure: ORIF of Right periprosthetic fracture;  Surgeon: Paralee Cancel,  MD;  Location: [facility];  Service: Orthopedics;  Laterality: Right;    Family History  Problem Relation Age of Onset  . Colon cancer Mother   . Cancer Father   . Lung cancer Father   . Ovarian cancer Sister   . Breast cancer Sister   . Liver cancer Brother   . Heart disease Brother     Social History   Tobacco Use  . Smoking status: Never Smoker  . Smokeless tobacco: Never Used  Vaping Use  . Vaping Use: Never used  Substance Use Topics  . Alcohol use: No  . Drug use: No    Prior to Admission medications   Medication Sig Start Date End Date Taking? Authorizing Provider  aspirin 81 MG EC tablet Take 1 tablet (81 mg total) by mouth 2 (two) times daily. Resume aspirin 81 mg daily after 30 days. 11/22/20 12/22/20  Terrilee Croak, MD  b complex vitamins tablet Take 1 tablet by mouth daily.    [provider]  bisacodyl (DULCOLAX) 10 MG suppository Place 1 suppository (10 mg total) rectally daily as needed for moderate constipation. 11/22/20   Terrilee Croak, MD  bisoprolol-hydrochlorothiazide (ZIAC) 5-6.25 MG tablet Take 1 tablet by mouth daily.    [provider]  Calcium-Vitamin D-Vitamin K (VIACTIV CALCIUM PLUS D)  650-12.5-40 MG-MCG-MCG CHEW As directed Patient not taking: Reported on 11/14/2020 11/04/20   Eulas Post, MD  feeding supplement (ENSURE ENLIVE / ENSURE PLUS) LIQD Take 237 mLs by mouth 2 (two) times daily between meals. 11/22/20   Terrilee Croak, MD  ferrous sulfate 325 (65 FE) MG tablet Take 1 tablet (325 mg total) by mouth daily with breakfast. 11/22/20   Dahal, Marlowe Aschoff, MD  Glucosamine-Chondroitin 250-200 MG TABS Take 1 tablet by mouth daily. 11/04/20   Burchette, Alinda Sierras, MD  Multiple Minerals-Vitamins (CITRACAL PLUS) TABS Take 1 tablet by mouth daily. 11/28/19   Burchette, Alinda Sierras, MD  Omega-3 Fatty Acids (FISH OIL) 1000 MG CAPS Take 2 capsules (2,000 mg total) by mouth 2 (two) times daily. 11/04/20   Burchette, Alinda Sierras, MD  polyvinyl alcohol (LIQUIFILM  TEARS) 1.4 % ophthalmic solution Place 1 drop into both eyes as needed for dry eyes.    [provider]  senna-docusate (SENOKOT-S) 8.6-50 MG tablet Take 1 tablet by mouth at bedtime as needed for mild constipation. 11/22/20   Terrilee Croak, MD  Vitamin D, Ergocalciferol, (DRISDOL) 1.25 MG (50000 UNIT) CAPS capsule Take 1 capsule (50,000 Units total) by mouth every 7 (seven) days. 11/04/20   Burchette, Alinda Sierras, MD    Allergies Codeine and Flagyl [metronidazole hcl]   REVIEW OF SYSTEMS  Negative except as noted here or in the History of Present Illness.   PHYSICAL EXAMINATION  Initial Vital Signs Blood pressure 138/85, pulse 91, temperature 98 F (36.7 C), temperature source Oral, resp. rate 17, height 5\' 6"  (1.676 m), weight 54 kg, SpO2 99 %.  Examination General: Well-developed, thin female in no acute distress; appearance consistent with age of record HENT: normocephalic; atraumatic Eyes: pupils equal, round and reactive to light; extraocular muscles intact Neck: supple Heart: regular rate and rhythm Lungs: clear to auscultation bilaterally Abdomen: soft; nondistended; nontender; bowel sounds present Extremities: No deformity; full range of motion; pulses normal; edema of right lower extremity without erythema or warmth Neurologic: Awake, alert and oriented; motor intact and symmetric; sensation symmetric but altered in lower legs and feet; no facial droop Skin: Warm and dry Psychiatric: Normal mood and affect   RESULTS  Summary of this visit's results, reviewed and interpreted by myself:   EKG Interpretation  Date/Time:    Ventricular Rate:    PR Interval:    QRS Duration:   QT Interval:    QTC Calculation:   R Axis:     Text Interpretation:        Laboratory Studies: Results for orders placed or performed during the hospital encounter of 11/28/20 (from the past 24 hour(s))  D-dimer, quantitative (not at [facility])     Status: Abnormal   Collection Time:  11/28/20  6:00 AM  Result Value Ref Range   D-Dimer, Quant 14.07 (H) 0.00 - 0.50 ug/mL-FEU  CBC with Differential/Platelet     Status: Abnormal   Collection Time: 11/28/20  6:00 AM  Result Value Ref Range   WBC 12.1 (H) 4.0 - 10.5 K/uL   RBC 3.02 (L) 3.87 - 5.11 MIL/uL   Hemoglobin 9.4 (L) 12.0 - 15.0 g/dL   HCT 28.7 (L) 36.0 - 46.0 %   MCV 95.0 80.0 - 100.0 fL   MCH 31.1 26.0 - 34.0 pg   MCHC 32.8 30.0 - 36.0 g/dL   RDW 15.4 11.5 - 15.5 %   Platelets 399 150 - 400 K/uL   nRBC 0.0 0.0 - 0.2 %   Neutrophils  Relative % 75 %   Neutro Abs 9.1 (H) 1.7 - 7.7 K/uL   Lymphocytes Relative 16 %   Lymphs Abs 1.9 0.7 - 4.0 K/uL   Monocytes Relative 6 %   Monocytes Absolute 0.7 0.1 - 1.0 K/uL   Eosinophils Relative 1 %   Eosinophils Absolute 0.1 0.0 - 0.5 K/uL   Basophils Relative 1 %   Basophils Absolute 0.1 0.0 - 0.1 K/uL   Immature Granulocytes 1 %   Abs Immature Granulocytes 0.14 (H) 0.00 - 0.07 K/uL  Basic metabolic panel     Status: Abnormal   Collection Time: 11/28/20  6:00 AM  Result Value Ref Range   Sodium 138 135 - 145 mmol/L   Potassium 3.7 3.5 - 5.1 mmol/L   Chloride 103 98 - 111 mmol/L   CO2 21 (L) 22 - 32 mmol/L   Glucose, Bld 99 70 - 99 mg/dL   BUN 26 (H) 8 - 23 mg/dL   Creatinine, Ser 0.56 0.44 - 1.00 mg/dL   Calcium 8.5 (L) 8.9 - 10.3 mg/dL   GFR, Estimated >60 >60 mL/min   Anion gap 14 5 - 15  Magnesium     Status: None   Collection Time: 11/28/20  6:00 AM  Result Value Ref Range   Magnesium 2.1 1.7 - 2.4 mg/dL   Imaging Studies: DG Hip Unilat W or Wo Pelvis 2-3 Views Right  Result Date: 11/28/2020 CLINICAL DATA:  Painful hip with burning sensation in the leg EXAM: DG HIP (WITH OR WITHOUT PELVIS) 2-3V RIGHT COMPARISON:  CT 11/16/2020, radiograph 11/14/2020, 11/17/2020 FINDINGS: Images redemonstrate a right hip hemiarthroplasty with revision changes including a lateral plate construct secured by cerclage wires across the proximal femoral metadiaphysis. Displaced  greater and lesser trochanteric fracture fragments are in similar alignment to prior. No clear acute hardware failure is evident at this time there is however some extensive persistent edematous soft tissue changes on a background of diffuse mild edema. Vascular calcium is noted in the soft tissues as well. Remaining bones of the pelvis are intact and congruent. Background of moderate bilateral arthrosis in both hips. Additional degenerative changes in the spine and SI joints. IMPRESSION: 1. Status post right hip hemiarthroplasty with side plate fixation construct and cerclage wires. Hardware is in similar alignment to comparison postoperative views. 2. Extensive persistent edematous soft tissue changes about the right hip, nonspecific in the recent postoperative state though should correlate with clinical features to exclude a superimposed infection. No soft tissue gas. Electronically Signed   By: Lovena Le M.D.   On: 11/28/2020 05:54    ED COURSE and MDM  Nursing notes, initial and subsequent vitals signs, including pulse oximetry, reviewed and interpreted by myself.  Vitals:   11/28/20 0517 11/28/20 0523  BP:  138/85  Pulse:  91  Resp:  17  Temp:  98 F (36.7 C)  TempSrc:  Oral  SpO2:  99%  Weight: 54 kg   Height: 5\' 6"  (1.676 m)    Medications  multivitamin with minerals tablet 2 tablet (2 tablets Oral Not Given 11/28/20 0636)   6:47 AM Patient's presentation is concerning for DVT given significantly elevated D-dimer in the setting of recent right hip surgery.  We will obtain a Doppler to evaluate for a DVT.  Patient's anemia appears chronic and has actually improved.  The cause of the patient's acute neuropathy is unclear but will likely require outpatient work-up.  6:50 AM Signed out to Dr. Melina Copa.   PROCEDURES  Procedures   ED DIAGNOSES     ICD-10-CM   1. Suspected DVT (deep vein thrombosis)  R09.89   2. Neuropathy of both feet  G57.93        Everton Bertha, Jenny Reichmann,  MD 11/28/20 [phone]

## 2020-11-28 NOTE — ED Provider Notes (Signed)
Signout from Dr. Florina Ou.  87 year old female recent ORIF of right periprosthetic hip fracture here with swelling of right leg and paresthesias both feet.  Plan is to follow-up on duplex right lower extremity. Physical Exam  BP 138/85 (BP Location: Right Arm)   Pulse 91   Temp 98 F (36.7 C) (Oral)   Resp 17   Ht 5\' 6"  (1.676 m)   Wt 54 kg   SpO2 99%   BMI 19.21 kg/m   Physical Exam  ED Course/Procedures     Procedures  MDM  Patient's vascular study is negative for acute DVT.  Reviewed this with the patient and her daughter.  They are comfortable plan for returning back to [facility].  Nurse updated       Hayden Rasmussen, MD 11/29/20 [phone]

## 2020-11-28 NOTE — ED Triage Notes (Signed)
Patient comes from [facility] by [facility]. Patient is complaining of numbness in lower extremity. Patient is having swelling to right lower extremity. Patient has hx of right hip fracture.

## 2020-11-28 NOTE — Discharge Instructions (Addendum)
You were seen in the emergency department for burning and swelling of your right leg.  You had an ultrasound that did not show any evidence of a deep vein thrombosis or blood clot.  The swelling is likely due to your recent surgery and sitting for prolonged time in a chair.  You should try to elevate your leg.  Ambulate as tolerated.  Follow-up with your orthopedic surgeon.  Return to the emergency department if any worsening or concerning symptoms.

## 2020-12-08 DIAGNOSIS — Z4789 Encounter for other orthopedic aftercare: Secondary | ICD-10-CM | POA: Diagnosis not present

## 2020-12-10 ENCOUNTER — Telehealth: Payer: Self-pay | Admitting: Family Medicine

## 2020-12-10 ENCOUNTER — Telehealth: Payer: Self-pay | Admitting: *Deleted

## 2020-12-10 DIAGNOSIS — K5900 Constipation, unspecified: Secondary | ICD-10-CM | POA: Diagnosis not present

## 2020-12-10 DIAGNOSIS — I4891 Unspecified atrial fibrillation: Secondary | ICD-10-CM | POA: Diagnosis not present

## 2020-12-10 DIAGNOSIS — D509 Iron deficiency anemia, unspecified: Secondary | ICD-10-CM | POA: Diagnosis not present

## 2020-12-10 DIAGNOSIS — D62 Acute posthemorrhagic anemia: Secondary | ICD-10-CM | POA: Diagnosis not present

## 2020-12-10 DIAGNOSIS — Z7982 Long term (current) use of aspirin: Secondary | ICD-10-CM | POA: Diagnosis not present

## 2020-12-10 DIAGNOSIS — E559 Vitamin D deficiency, unspecified: Secondary | ICD-10-CM | POA: Diagnosis not present

## 2020-12-10 DIAGNOSIS — I10 Essential (primary) hypertension: Secondary | ICD-10-CM | POA: Diagnosis not present

## 2020-12-10 DIAGNOSIS — Z87440 Personal history of urinary (tract) infections: Secondary | ICD-10-CM | POA: Diagnosis not present

## 2020-12-10 DIAGNOSIS — M9701XD Periprosthetic fracture around internal prosthetic right hip joint, subsequent encounter: Secondary | ICD-10-CM | POA: Diagnosis not present

## 2020-12-10 DIAGNOSIS — Z981 Arthrodesis status: Secondary | ICD-10-CM | POA: Diagnosis not present

## 2020-12-10 DIAGNOSIS — W009XXD Unspecified fall due to ice and snow, subsequent encounter: Secondary | ICD-10-CM | POA: Diagnosis not present

## 2020-12-10 DIAGNOSIS — K589 Irritable bowel syndrome without diarrhea: Secondary | ICD-10-CM | POA: Diagnosis not present

## 2020-12-10 DIAGNOSIS — Z79891 Long term (current) use of opiate analgesic: Secondary | ICD-10-CM | POA: Diagnosis not present

## 2020-12-10 DIAGNOSIS — Z86010 Personal history of colonic polyps: Secondary | ICD-10-CM | POA: Diagnosis not present

## 2020-12-10 DIAGNOSIS — Z9049 Acquired absence of other specified parts of digestive tract: Secondary | ICD-10-CM | POA: Diagnosis not present

## 2020-12-10 DIAGNOSIS — M80051D Age-related osteoporosis with current pathological fracture, right femur, subsequent encounter for fracture with routine healing: Secondary | ICD-10-CM | POA: Diagnosis not present

## 2020-12-10 DIAGNOSIS — K5790 Diverticulosis of intestine, part unspecified, without perforation or abscess without bleeding: Secondary | ICD-10-CM | POA: Diagnosis not present

## 2020-12-10 DIAGNOSIS — Z9181 History of falling: Secondary | ICD-10-CM | POA: Diagnosis not present

## 2020-12-10 NOTE — Telephone Encounter (Signed)
Collier Salina with [facility] [phone] need verbal orders for:  Physical Therapy : 2x a week for 8 weeks and then 1x a week for 1 week  Please call [phone]

## 2020-12-10 NOTE — Telephone Encounter (Signed)
Verbal order given  

## 2020-12-10 NOTE — Telephone Encounter (Signed)
Would you like this patient to continue Prolia injections?

## 2020-12-11 NOTE — Telephone Encounter (Signed)
Pt is calling in to see if she can get her Prolia injection when she comes in the office on Tuesday 12/16/2020 if she is not able to get it at that time she would like to change her Tuesday appointment to a virtual appointment.  Spoke w/Rachel and she will get with Dr. Elease Hashimoto and give the pt a call back to let her know.

## 2020-12-11 NOTE — Telephone Encounter (Signed)
Patient is aware 

## 2020-12-11 NOTE — Telephone Encounter (Signed)
I would continue the Prolia.

## 2020-12-12 DIAGNOSIS — D509 Iron deficiency anemia, unspecified: Secondary | ICD-10-CM | POA: Diagnosis not present

## 2020-12-12 DIAGNOSIS — M80051D Age-related osteoporosis with current pathological fracture, right femur, subsequent encounter for fracture with routine healing: Secondary | ICD-10-CM | POA: Diagnosis not present

## 2020-12-12 DIAGNOSIS — I4891 Unspecified atrial fibrillation: Secondary | ICD-10-CM | POA: Diagnosis not present

## 2020-12-12 DIAGNOSIS — D62 Acute posthemorrhagic anemia: Secondary | ICD-10-CM | POA: Diagnosis not present

## 2020-12-12 DIAGNOSIS — M9701XD Periprosthetic fracture around internal prosthetic right hip joint, subsequent encounter: Secondary | ICD-10-CM | POA: Diagnosis not present

## 2020-12-12 DIAGNOSIS — I10 Essential (primary) hypertension: Secondary | ICD-10-CM | POA: Diagnosis not present

## 2020-12-15 ENCOUNTER — Other Ambulatory Visit: Payer: Self-pay

## 2020-12-15 DIAGNOSIS — I4891 Unspecified atrial fibrillation: Secondary | ICD-10-CM | POA: Diagnosis not present

## 2020-12-15 DIAGNOSIS — M9701XD Periprosthetic fracture around internal prosthetic right hip joint, subsequent encounter: Secondary | ICD-10-CM | POA: Diagnosis not present

## 2020-12-15 DIAGNOSIS — I10 Essential (primary) hypertension: Secondary | ICD-10-CM | POA: Diagnosis not present

## 2020-12-15 DIAGNOSIS — D62 Acute posthemorrhagic anemia: Secondary | ICD-10-CM | POA: Diagnosis not present

## 2020-12-15 DIAGNOSIS — M80051D Age-related osteoporosis with current pathological fracture, right femur, subsequent encounter for fracture with routine healing: Secondary | ICD-10-CM | POA: Diagnosis not present

## 2020-12-15 DIAGNOSIS — D509 Iron deficiency anemia, unspecified: Secondary | ICD-10-CM | POA: Diagnosis not present

## 2020-12-16 ENCOUNTER — Encounter: Payer: Self-pay | Admitting: Family Medicine

## 2020-12-16 ENCOUNTER — Ambulatory Visit (INDEPENDENT_AMBULATORY_CARE_PROVIDER_SITE_OTHER): Payer: Medicare Other | Admitting: Family Medicine

## 2020-12-16 VITALS — BP 118/72 | HR 75 | Temp 97.8°F | Ht 66.0 in | Wt 120.9 lb

## 2020-12-16 DIAGNOSIS — M818 Other osteoporosis without current pathological fracture: Secondary | ICD-10-CM

## 2020-12-16 DIAGNOSIS — S72001D Fracture of unspecified part of neck of right femur, subsequent encounter for closed fracture with routine healing: Secondary | ICD-10-CM | POA: Diagnosis not present

## 2020-12-16 DIAGNOSIS — I10 Essential (primary) hypertension: Secondary | ICD-10-CM

## 2020-12-16 MED ORDER — HYDROCODONE-ACETAMINOPHEN 7.5-325 MG PO TABS: 1.0000 | ORAL_TABLET | Freq: Four times a day (QID) | ORAL | 0 refills | Status: DC | PRN

## 2020-12-16 MED ORDER — DENOSUMAB 60 MG/ML ~~LOC~~ SOSY
60.0000 mg | PREFILLED_SYRINGE | Freq: Once | SUBCUTANEOUS | Status: AC
  Administered 2020-12-16: 60 mg via SUBCUTANEOUS

## 2020-12-16 NOTE — Patient Instructions (Signed)
Consider over the counter stool softener such as Colace or Senokot S

## 2020-12-16 NOTE — Progress Notes (Signed)
Established Patient Office Visit  Subjective:  Patient ID: [name], female    DOB: 26-Jun-1932  Age: 87 y.o. MRN: [id number]  CC:  Chief Complaint  Patient presents with  . Follow-up    HPI [name] presents for follow-up from recent right hip fracture and rehab stay.  She had prior history of titanium rod in the right hip.  She fell on the ice on 21 January at home.  She sustained right periprosthetic fracture and underwent open reduction internal fixation on 1-24.  She was sent to skilled nursing facility for rehab.  She reportedly had significant anemia and received 3 units of red blood cells.  Most recent hemoglobin was on 11-28-20 which was 9.4.  She is not taking any iron but eating fairly well.  She got out of [facility] nursing home on the 15th.  He is at home with her husband.  She states her pain is gradually improving.  She received hydrocodone from nursing home and has tapered this down to just 1 at night.  She is still having some night pain and she hopes to taper off this completely over the next couple weeks.  She is getting home physical therapy.  Ambulating without difficulty.  She does have longstanding history of osteoporosis.  She is on Prolia injections and due for follow-up Prolia today.  She is having some constipation likely related to the opioids.  Not currently taking any stool softeners.  Blood pressure apparently was quite high intermittently at the nursing home but is back down today.  She does have history of hypertension.  She remains on bisoprolol-HCTZ combination  Past Medical History:  Diagnosis Date  . Constipation   . Diverticulosis   . Hypertension   . Inguinal hernia, left   . Irritable bowel syndrome   . Osteoporosis   . UTI (lower urinary tract infection)    hx of frequent uti's    Past Surgical History:  Procedure Laterality Date  . APPENDECTOMY    . BREAST SURGERY Left    biopsy  . CERVICAL FUSION    . colon polyps     . HEMORRHOID SURGERY    . HIP SURGERY Right   . INGUINAL HERNIA REPAIR Left 01/13/2017   Procedure: LEFT INGUINAL HERNIA REPAIR WITH MESH;  Surgeon: Jackolyn Confer, MD;  Location[facility];  Service: General;  Laterality: Left;  . INSERTION OF MESH Left 01/13/2017   Procedure: INSERTION OF MESH;  Surgeon: Jackolyn Confer, MD;  [facility]  Service: General;  Laterality: Left;  . LUMBAR FUSION    . LUMBAR LAMINECTOMY    . partial resection of colon for tumor    . SHOULDER SURGERY Left   . TOTAL HIP ARTHROPLASTY     right  . TOTAL HIP REVISION Right 11/17/2020   Procedure: ORIF of Right periprosthetic fracture;  Surgeon: Paralee Cancel, MD;  Location: [facility];  Service: Orthopedics;  Laterality: Right;    Family History  Problem Relation Age of Onset  . Colon cancer Mother   . Cancer Father   . Lung cancer Father   . Ovarian cancer Sister   . Breast cancer Sister   . Liver cancer Brother   . Heart disease Brother     Social History   Socioeconomic History  . Marital status: Married    Spouse name: Not on file  . Number of children: Not on file  . Years of education: Not on file  .  Highest education level: Not on file  Occupational History  . Occupation: retired    Fish farm manager: RETIRED  Tobacco Use  . Smoking status: Never Smoker  . Smokeless tobacco: Never Used  Vaping Use  . Vaping Use: Never used  Substance and Sexual Activity  . Alcohol use: No  . Drug use: No  . Sexual activity: Not Currently  Other Topics Concern  . 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 Difficulty of Paying Living Expenses: Not hard at all  Food Insecurity: No Food Insecurity  . Worried About Charity fundraiser in the Last Year: Never true  . Ran Out of Food in the Last Year:  Never true  Transportation Needs: No Transportation Needs  . Lack of Transportation (Medical): No  . Lack of Transportation (Non-Medical): No  Physical Activity: Sufficiently Active  . Days of Exercise per Week: 7 days  . Minutes of Exercise per Session: 30 min  Stress: No Stress Concern Present  . Feeling of Stress : Not at all  Social Connections: Moderately Integrated  . Frequency of Communication with Friends and Family: More than three times a week  . Frequency of Social Gatherings with Friends and Family: More than three times a week  . Attends Religious Services: More than 4 times per year  . Active Member of Clubs or Organizations: No  . Attends Archivist Meetings: Never  . Marital Status: Married  Human resources officer Violence: Not At Risk  . Fear of Current or Ex-Partner: No  . Emotionally Abused: No  . Physically Abused: No  . Sexually Abused: No    Outpatient Medications Prior to Visit  Medication Sig Dispense Refill  . aspirin 81 MG EC tablet Take 1 tablet (81 mg total) by mouth 2 (two) times daily. Resume aspirin 81 mg daily after 30 days. 30 tablet   . b complex vitamins tablet Take 1 tablet by mouth daily.    . bisoprolol-hydrochlorothiazide (ZIAC) 5-6.25 MG tablet Take 1 tablet by mouth daily.    . Calcium-Vitamin D-Vitamin K (VIACTIV CALCIUM PLUS D) 650-12.5-40 MG-MCG-MCG CHEW As directed 90 tablet 3  . feeding supplement (BOOST / RESOURCE BREEZE) LIQD Take 1 Container by mouth 2 (two) times daily.    . Glucosamine-Chondroitin 250-200 MG TABS Take 1 tablet by mouth daily. 90 tablet 3  . Multiple Minerals-Vitamins (CITRACAL PLUS) TABS Take 1 tablet by mouth daily. 90 tablet 3  . Multiple Vitamin (MULTIVITAMIN WITH MINERALS) TABS tablet Take 1 tablet by mouth daily.    . Omega-3 Fatty Acids (FISH OIL) 1000 MG CAPS Take 2 capsules (2,000 mg total) by mouth 2 (two) times daily. 120 capsule 3  . PRESCRIPTION MEDICATION Take 240 mLs by mouth 2 (two) times daily.  MEDPASS    . Vitamin D, Ergocalciferol, (DRISDOL) 1.25 MG (50000 UNIT) CAPS capsule Take 1 capsule (50,000 Units total) by mouth every 7 (seven) days. 12 capsule 2  . HYDROcodone-acetaminophen (NORCO) 7.5-325 MG tablet Take 1 tablet by mouth every 6 (six) hours as needed for severe pain.    . bisacodyl (DULCOLAX) 10 MG suppository Place 1 suppository (10 mg total) rectally daily as needed for moderate constipation. 12 suppository 0  . feeding supplement (ENSURE ENLIVE / ENSURE PLUS) LIQD  Take 237 mLs by mouth 2 (two) times daily between meals. (Patient not taking: Reported on 11/28/2020) 237 mL 12  . ferrous sulfate 325 (65 FE) MG tablet Take 1 tablet (325 mg total) by mouth daily with breakfast.  3  . methocarbamol (ROBAXIN) 500 MG tablet Take 500 mg by mouth every 6 (six) hours as needed for muscle spasms.    . polyvinyl alcohol (LIQUIFILM TEARS) 1.4 % ophthalmic solution Place 1 drop into both eyes every 8 (eight) hours as needed for dry eyes.    Marland Kitchen senna-docusate (SENOKOT-S) 8.6-50 MG tablet Take 1 tablet by mouth at bedtime as needed for mild constipation.     No facility-administered medications prior to visit.    Allergies  Allergen Reactions  . Codeine     REACTION: headaches  . Flagyl [Metronidazole Hcl]     nausea    ROS Review of Systems  Constitutional: Negative for fatigue.  Eyes: Negative for visual disturbance.  Respiratory: Negative for cough, chest tightness, shortness of breath and wheezing.   Cardiovascular: Negative for chest pain, palpitations and leg swelling.  Neurological: Negative for dizziness, seizures, syncope, weakness, light-headedness and headaches.      Objective:    Physical Exam Constitutional:      Appearance: She is well-developed and well-nourished.  Eyes:     Pupils: Pupils are equal, round, and reactive to light.  Neck:     Thyroid: No thyromegaly.     Vascular: No JVD.  Cardiovascular:     Rate and Rhythm: Normal rate and regular rhythm.      Heart sounds: No gallop.   Pulmonary:     Effort: Pulmonary effort is normal. No respiratory distress.     Breath sounds: Normal breath sounds. No wheezing or rales.  Musculoskeletal:        General: No edema.     Cervical back: Neck supple.     Comments: Wound right hip is examined and healing well.  No erythema.  Mild expected swelling.  No drainage.  Neurological:     Mental Status: She is alert.     BP 118/72 (BP Location: Left Arm, Patient Position: Sitting, Cuff Size: Normal)   Pulse 75   Temp 97.8 F (36.6 C) (Oral)   Ht 5\' 6"  (1.676 m)   Wt 120 lb 14.4 oz (54.8 kg)   SpO2 96%   BMI 19.51 kg/m  Wt Readings from Last 3 Encounters:  12/16/20 120 lb 14.4 oz (54.8 kg)  11/28/20 119 lb 0.8 oz (54 kg)  11/14/20 119 lb 0.8 oz (54 kg)     There are no preventive care reminders to display for this patient.  There are no preventive care reminders to display for this patient.  Lab Results  Component Value Date   TSH 1.71 05/21/2013   Lab Results  Component Value Date   WBC 12.1 (H) 11/28/2020   HGB 9.4 (L) 11/28/2020   HCT 28.7 (L) 11/28/2020   MCV 95.0 11/28/2020   PLT 399 11/28/2020   Lab Results  Component Value Date   NA 138 11/28/2020   K 3.7 11/28/2020   CO2 21 (L) 11/28/2020   GLUCOSE 99 11/28/2020   BUN 26 (H) 11/28/2020   CREATININE 0.56 11/28/2020   BILITOT 0.8 11/15/2020   ALKPHOS 35 (L) 11/15/2020   AST 21 11/15/2020   ALT 11 11/15/2020   PROT 5.2 (L) 11/15/2020   ALBUMIN 3.0 (L) 11/15/2020   CALCIUM 8.5 (L) 11/28/2020   ANIONGAP 14  11/28/2020   GFR 71.15 09/17/2019   Lab Results  Component Value Date   CHOL 205 (H) 05/21/2013   Lab Results  Component Value Date   HDL 58.40 05/21/2013   Lab Results  Component Value Date   LDLCALC 110 (H) 04/14/2012   Lab Results  Component Value Date   TRIG 95.0 05/21/2013   Lab Results  Component Value Date   CHOLHDL 4 05/21/2013   No results found for: HGBA1C    Assessment & Plan:    #1 recent periprosthetic fracture of the right hip status post surgery with open reduction internal fixation on the 24th.  She is recovering well at this point and getting home physical therapy following recent skilled nursing facility  -Continue incentive spirometer use several times daily -Continue home physical therapy -We did give her 1 refill of Vicodin 7.5 mg to use 1 nightly.  She will taper off this gradually. -We suggested over-the-counter stool softener such as Colace or Senokot S to help reduce constipation risk  #2 postoperative anemia -Patient symptomatically stable at this time.  Increase iron rich foods and follow-up in 2 months and recheck CBC then  #3 hypertension stable and at goal -Continue bisoprolol HCTZ  #4 osteoporosis -Continue Prolia injections every 6 months. -Continue with daily calcium and vitamin D supplementation  Meds ordered this encounter  Medications  . HYDROcodone-acetaminophen (NORCO) 7.5-325 MG tablet    Sig: Take 1 tablet by mouth every 6 (six) hours as needed for severe pain.    Dispense:  20 tablet    Refill:  0    Follow-up: Return today (on 12/16/2020).    [name], MD

## 2020-12-19 DIAGNOSIS — I10 Essential (primary) hypertension: Secondary | ICD-10-CM | POA: Diagnosis not present

## 2020-12-19 DIAGNOSIS — M9701XD Periprosthetic fracture around internal prosthetic right hip joint, subsequent encounter: Secondary | ICD-10-CM | POA: Diagnosis not present

## 2020-12-19 DIAGNOSIS — M80051D Age-related osteoporosis with current pathological fracture, right femur, subsequent encounter for fracture with routine healing: Secondary | ICD-10-CM | POA: Diagnosis not present

## 2020-12-19 DIAGNOSIS — D62 Acute posthemorrhagic anemia: Secondary | ICD-10-CM | POA: Diagnosis not present

## 2020-12-19 DIAGNOSIS — I4891 Unspecified atrial fibrillation: Secondary | ICD-10-CM | POA: Diagnosis not present

## 2020-12-19 DIAGNOSIS — D509 Iron deficiency anemia, unspecified: Secondary | ICD-10-CM | POA: Diagnosis not present

## 2020-12-22 DIAGNOSIS — I4891 Unspecified atrial fibrillation: Secondary | ICD-10-CM | POA: Diagnosis not present

## 2020-12-22 DIAGNOSIS — D62 Acute posthemorrhagic anemia: Secondary | ICD-10-CM | POA: Diagnosis not present

## 2020-12-22 DIAGNOSIS — M9701XD Periprosthetic fracture around internal prosthetic right hip joint, subsequent encounter: Secondary | ICD-10-CM | POA: Diagnosis not present

## 2020-12-22 DIAGNOSIS — M80051D Age-related osteoporosis with current pathological fracture, right femur, subsequent encounter for fracture with routine healing: Secondary | ICD-10-CM | POA: Diagnosis not present

## 2020-12-22 DIAGNOSIS — I10 Essential (primary) hypertension: Secondary | ICD-10-CM | POA: Diagnosis not present

## 2020-12-22 DIAGNOSIS — D509 Iron deficiency anemia, unspecified: Secondary | ICD-10-CM | POA: Diagnosis not present

## 2020-12-24 ENCOUNTER — Other Ambulatory Visit: Payer: Self-pay | Admitting: Family Medicine

## 2020-12-26 DIAGNOSIS — I10 Essential (primary) hypertension: Secondary | ICD-10-CM | POA: Diagnosis not present

## 2020-12-26 DIAGNOSIS — M80051D Age-related osteoporosis with current pathological fracture, right femur, subsequent encounter for fracture with routine healing: Secondary | ICD-10-CM | POA: Diagnosis not present

## 2020-12-26 DIAGNOSIS — I4891 Unspecified atrial fibrillation: Secondary | ICD-10-CM | POA: Diagnosis not present

## 2020-12-26 DIAGNOSIS — D509 Iron deficiency anemia, unspecified: Secondary | ICD-10-CM | POA: Diagnosis not present

## 2020-12-26 DIAGNOSIS — M9701XD Periprosthetic fracture around internal prosthetic right hip joint, subsequent encounter: Secondary | ICD-10-CM | POA: Diagnosis not present

## 2020-12-26 DIAGNOSIS — D62 Acute posthemorrhagic anemia: Secondary | ICD-10-CM | POA: Diagnosis not present

## 2020-12-30 DIAGNOSIS — M9701XD Periprosthetic fracture around internal prosthetic right hip joint, subsequent encounter: Secondary | ICD-10-CM | POA: Diagnosis not present

## 2020-12-30 DIAGNOSIS — I4891 Unspecified atrial fibrillation: Secondary | ICD-10-CM | POA: Diagnosis not present

## 2020-12-30 DIAGNOSIS — I10 Essential (primary) hypertension: Secondary | ICD-10-CM | POA: Diagnosis not present

## 2020-12-30 DIAGNOSIS — D509 Iron deficiency anemia, unspecified: Secondary | ICD-10-CM | POA: Diagnosis not present

## 2020-12-30 DIAGNOSIS — D62 Acute posthemorrhagic anemia: Secondary | ICD-10-CM | POA: Diagnosis not present

## 2020-12-30 DIAGNOSIS — M80051D Age-related osteoporosis with current pathological fracture, right femur, subsequent encounter for fracture with routine healing: Secondary | ICD-10-CM | POA: Diagnosis not present

## 2020-12-31 DIAGNOSIS — Z4789 Encounter for other orthopedic aftercare: Secondary | ICD-10-CM | POA: Diagnosis not present

## 2020-12-31 DIAGNOSIS — M9701XD Periprosthetic fracture around internal prosthetic right hip joint, subsequent encounter: Secondary | ICD-10-CM | POA: Diagnosis not present

## 2021-01-02 DIAGNOSIS — D62 Acute posthemorrhagic anemia: Secondary | ICD-10-CM | POA: Diagnosis not present

## 2021-01-02 DIAGNOSIS — M9701XD Periprosthetic fracture around internal prosthetic right hip joint, subsequent encounter: Secondary | ICD-10-CM | POA: Diagnosis not present

## 2021-01-02 DIAGNOSIS — M80051D Age-related osteoporosis with current pathological fracture, right femur, subsequent encounter for fracture with routine healing: Secondary | ICD-10-CM | POA: Diagnosis not present

## 2021-01-02 DIAGNOSIS — I10 Essential (primary) hypertension: Secondary | ICD-10-CM | POA: Diagnosis not present

## 2021-01-02 DIAGNOSIS — D509 Iron deficiency anemia, unspecified: Secondary | ICD-10-CM | POA: Diagnosis not present

## 2021-01-02 DIAGNOSIS — I4891 Unspecified atrial fibrillation: Secondary | ICD-10-CM | POA: Diagnosis not present

## 2021-01-05 DIAGNOSIS — M9701XD Periprosthetic fracture around internal prosthetic right hip joint, subsequent encounter: Secondary | ICD-10-CM | POA: Diagnosis not present

## 2021-01-05 DIAGNOSIS — D509 Iron deficiency anemia, unspecified: Secondary | ICD-10-CM | POA: Diagnosis not present

## 2021-01-05 DIAGNOSIS — D62 Acute posthemorrhagic anemia: Secondary | ICD-10-CM | POA: Diagnosis not present

## 2021-01-05 DIAGNOSIS — M80051D Age-related osteoporosis with current pathological fracture, right femur, subsequent encounter for fracture with routine healing: Secondary | ICD-10-CM | POA: Diagnosis not present

## 2021-01-05 DIAGNOSIS — I10 Essential (primary) hypertension: Secondary | ICD-10-CM | POA: Diagnosis not present

## 2021-01-05 DIAGNOSIS — I4891 Unspecified atrial fibrillation: Secondary | ICD-10-CM | POA: Diagnosis not present

## 2021-01-09 DIAGNOSIS — Z4789 Encounter for other orthopedic aftercare: Secondary | ICD-10-CM | POA: Diagnosis not present

## 2021-01-09 DIAGNOSIS — Z981 Arthrodesis status: Secondary | ICD-10-CM | POA: Diagnosis not present

## 2021-01-09 DIAGNOSIS — Z9181 History of falling: Secondary | ICD-10-CM | POA: Diagnosis not present

## 2021-01-09 DIAGNOSIS — Z86010 Personal history of colonic polyps: Secondary | ICD-10-CM | POA: Diagnosis not present

## 2021-01-09 DIAGNOSIS — M9701XD Periprosthetic fracture around internal prosthetic right hip joint, subsequent encounter: Secondary | ICD-10-CM | POA: Diagnosis not present

## 2021-01-09 DIAGNOSIS — K5790 Diverticulosis of intestine, part unspecified, without perforation or abscess without bleeding: Secondary | ICD-10-CM | POA: Diagnosis not present

## 2021-01-09 DIAGNOSIS — W009XXD Unspecified fall due to ice and snow, subsequent encounter: Secondary | ICD-10-CM | POA: Diagnosis not present

## 2021-01-09 DIAGNOSIS — K5900 Constipation, unspecified: Secondary | ICD-10-CM | POA: Diagnosis not present

## 2021-01-09 DIAGNOSIS — I10 Essential (primary) hypertension: Secondary | ICD-10-CM | POA: Diagnosis not present

## 2021-01-09 DIAGNOSIS — M80051D Age-related osteoporosis with current pathological fracture, right femur, subsequent encounter for fracture with routine healing: Secondary | ICD-10-CM | POA: Diagnosis not present

## 2021-01-09 DIAGNOSIS — I4891 Unspecified atrial fibrillation: Secondary | ICD-10-CM | POA: Diagnosis not present

## 2021-01-09 DIAGNOSIS — Z7982 Long term (current) use of aspirin: Secondary | ICD-10-CM | POA: Diagnosis not present

## 2021-01-09 DIAGNOSIS — Z87440 Personal history of urinary (tract) infections: Secondary | ICD-10-CM | POA: Diagnosis not present

## 2021-01-09 DIAGNOSIS — M25561 Pain in right knee: Secondary | ICD-10-CM | POA: Diagnosis not present

## 2021-01-09 DIAGNOSIS — E559 Vitamin D deficiency, unspecified: Secondary | ICD-10-CM | POA: Diagnosis not present

## 2021-01-09 DIAGNOSIS — M1711 Unilateral primary osteoarthritis, right knee: Secondary | ICD-10-CM | POA: Diagnosis not present

## 2021-01-09 DIAGNOSIS — Z79891 Long term (current) use of opiate analgesic: Secondary | ICD-10-CM | POA: Diagnosis not present

## 2021-01-09 DIAGNOSIS — D509 Iron deficiency anemia, unspecified: Secondary | ICD-10-CM | POA: Diagnosis not present

## 2021-01-09 DIAGNOSIS — Z9049 Acquired absence of other specified parts of digestive tract: Secondary | ICD-10-CM | POA: Diagnosis not present

## 2021-01-09 DIAGNOSIS — K589 Irritable bowel syndrome without diarrhea: Secondary | ICD-10-CM | POA: Diagnosis not present

## 2021-01-09 DIAGNOSIS — D62 Acute posthemorrhagic anemia: Secondary | ICD-10-CM | POA: Diagnosis not present

## 2021-01-12 DIAGNOSIS — D509 Iron deficiency anemia, unspecified: Secondary | ICD-10-CM | POA: Diagnosis not present

## 2021-01-12 DIAGNOSIS — M9701XD Periprosthetic fracture around internal prosthetic right hip joint, subsequent encounter: Secondary | ICD-10-CM | POA: Diagnosis not present

## 2021-01-12 DIAGNOSIS — I4891 Unspecified atrial fibrillation: Secondary | ICD-10-CM | POA: Diagnosis not present

## 2021-01-12 DIAGNOSIS — D62 Acute posthemorrhagic anemia: Secondary | ICD-10-CM | POA: Diagnosis not present

## 2021-01-12 DIAGNOSIS — I10 Essential (primary) hypertension: Secondary | ICD-10-CM | POA: Diagnosis not present

## 2021-01-12 DIAGNOSIS — M80051D Age-related osteoporosis with current pathological fracture, right femur, subsequent encounter for fracture with routine healing: Secondary | ICD-10-CM | POA: Diagnosis not present

## 2021-01-14 DIAGNOSIS — I10 Essential (primary) hypertension: Secondary | ICD-10-CM | POA: Diagnosis not present

## 2021-01-14 DIAGNOSIS — M80051D Age-related osteoporosis with current pathological fracture, right femur, subsequent encounter for fracture with routine healing: Secondary | ICD-10-CM | POA: Diagnosis not present

## 2021-01-14 DIAGNOSIS — I4891 Unspecified atrial fibrillation: Secondary | ICD-10-CM | POA: Diagnosis not present

## 2021-01-14 DIAGNOSIS — M9701XD Periprosthetic fracture around internal prosthetic right hip joint, subsequent encounter: Secondary | ICD-10-CM | POA: Diagnosis not present

## 2021-01-14 DIAGNOSIS — D509 Iron deficiency anemia, unspecified: Secondary | ICD-10-CM | POA: Diagnosis not present

## 2021-01-14 DIAGNOSIS — D62 Acute posthemorrhagic anemia: Secondary | ICD-10-CM | POA: Diagnosis not present

## 2021-01-20 DIAGNOSIS — D509 Iron deficiency anemia, unspecified: Secondary | ICD-10-CM | POA: Diagnosis not present

## 2021-01-20 DIAGNOSIS — M9701XD Periprosthetic fracture around internal prosthetic right hip joint, subsequent encounter: Secondary | ICD-10-CM | POA: Diagnosis not present

## 2021-01-20 DIAGNOSIS — D62 Acute posthemorrhagic anemia: Secondary | ICD-10-CM | POA: Diagnosis not present

## 2021-01-20 DIAGNOSIS — I10 Essential (primary) hypertension: Secondary | ICD-10-CM | POA: Diagnosis not present

## 2021-01-20 DIAGNOSIS — M80051D Age-related osteoporosis with current pathological fracture, right femur, subsequent encounter for fracture with routine healing: Secondary | ICD-10-CM | POA: Diagnosis not present

## 2021-01-20 DIAGNOSIS — I4891 Unspecified atrial fibrillation: Secondary | ICD-10-CM | POA: Diagnosis not present

## 2021-01-23 DIAGNOSIS — I4891 Unspecified atrial fibrillation: Secondary | ICD-10-CM | POA: Diagnosis not present

## 2021-01-23 DIAGNOSIS — D62 Acute posthemorrhagic anemia: Secondary | ICD-10-CM | POA: Diagnosis not present

## 2021-01-23 DIAGNOSIS — I10 Essential (primary) hypertension: Secondary | ICD-10-CM | POA: Diagnosis not present

## 2021-01-23 DIAGNOSIS — M9701XD Periprosthetic fracture around internal prosthetic right hip joint, subsequent encounter: Secondary | ICD-10-CM | POA: Diagnosis not present

## 2021-01-23 DIAGNOSIS — M80051D Age-related osteoporosis with current pathological fracture, right femur, subsequent encounter for fracture with routine healing: Secondary | ICD-10-CM | POA: Diagnosis not present

## 2021-01-23 DIAGNOSIS — D509 Iron deficiency anemia, unspecified: Secondary | ICD-10-CM | POA: Diagnosis not present

## 2021-01-27 DIAGNOSIS — D509 Iron deficiency anemia, unspecified: Secondary | ICD-10-CM | POA: Diagnosis not present

## 2021-01-27 DIAGNOSIS — I10 Essential (primary) hypertension: Secondary | ICD-10-CM | POA: Diagnosis not present

## 2021-01-27 DIAGNOSIS — D62 Acute posthemorrhagic anemia: Secondary | ICD-10-CM | POA: Diagnosis not present

## 2021-01-27 DIAGNOSIS — I4891 Unspecified atrial fibrillation: Secondary | ICD-10-CM | POA: Diagnosis not present

## 2021-01-27 DIAGNOSIS — M80051D Age-related osteoporosis with current pathological fracture, right femur, subsequent encounter for fracture with routine healing: Secondary | ICD-10-CM | POA: Diagnosis not present

## 2021-01-27 DIAGNOSIS — M9701XD Periprosthetic fracture around internal prosthetic right hip joint, subsequent encounter: Secondary | ICD-10-CM | POA: Diagnosis not present

## 2021-01-29 DIAGNOSIS — M80051D Age-related osteoporosis with current pathological fracture, right femur, subsequent encounter for fracture with routine healing: Secondary | ICD-10-CM | POA: Diagnosis not present

## 2021-01-29 DIAGNOSIS — M9701XD Periprosthetic fracture around internal prosthetic right hip joint, subsequent encounter: Secondary | ICD-10-CM | POA: Diagnosis not present

## 2021-01-29 DIAGNOSIS — I10 Essential (primary) hypertension: Secondary | ICD-10-CM | POA: Diagnosis not present

## 2021-01-29 DIAGNOSIS — I4891 Unspecified atrial fibrillation: Secondary | ICD-10-CM | POA: Diagnosis not present

## 2021-01-29 DIAGNOSIS — D62 Acute posthemorrhagic anemia: Secondary | ICD-10-CM | POA: Diagnosis not present

## 2021-01-29 DIAGNOSIS — D509 Iron deficiency anemia, unspecified: Secondary | ICD-10-CM | POA: Diagnosis not present

## 2021-02-05 ENCOUNTER — Telehealth: Payer: Self-pay | Admitting: Family Medicine

## 2021-02-05 DIAGNOSIS — M80051D Age-related osteoporosis with current pathological fracture, right femur, subsequent encounter for fracture with routine healing: Secondary | ICD-10-CM | POA: Diagnosis not present

## 2021-02-05 DIAGNOSIS — I10 Essential (primary) hypertension: Secondary | ICD-10-CM | POA: Diagnosis not present

## 2021-02-05 DIAGNOSIS — D62 Acute posthemorrhagic anemia: Secondary | ICD-10-CM | POA: Diagnosis not present

## 2021-02-05 DIAGNOSIS — I4891 Unspecified atrial fibrillation: Secondary | ICD-10-CM | POA: Diagnosis not present

## 2021-02-05 DIAGNOSIS — D509 Iron deficiency anemia, unspecified: Secondary | ICD-10-CM | POA: Diagnosis not present

## 2021-02-05 DIAGNOSIS — M9701XD Periprosthetic fracture around internal prosthetic right hip joint, subsequent encounter: Secondary | ICD-10-CM | POA: Diagnosis not present

## 2021-02-05 NOTE — Telephone Encounter (Signed)
[name] is calling and requesting verbal orders for physical therapy for 1 week 4, please advise. CB is [phone]

## 2021-02-08 DIAGNOSIS — Z79891 Long term (current) use of opiate analgesic: Secondary | ICD-10-CM | POA: Diagnosis not present

## 2021-02-08 DIAGNOSIS — W009XXD Unspecified fall due to ice and snow, subsequent encounter: Secondary | ICD-10-CM | POA: Diagnosis not present

## 2021-02-08 DIAGNOSIS — Z981 Arthrodesis status: Secondary | ICD-10-CM | POA: Diagnosis not present

## 2021-02-08 DIAGNOSIS — D509 Iron deficiency anemia, unspecified: Secondary | ICD-10-CM | POA: Diagnosis not present

## 2021-02-08 DIAGNOSIS — Z9181 History of falling: Secondary | ICD-10-CM | POA: Diagnosis not present

## 2021-02-08 DIAGNOSIS — Z9049 Acquired absence of other specified parts of digestive tract: Secondary | ICD-10-CM | POA: Diagnosis not present

## 2021-02-08 DIAGNOSIS — M80051D Age-related osteoporosis with current pathological fracture, right femur, subsequent encounter for fracture with routine healing: Secondary | ICD-10-CM | POA: Diagnosis not present

## 2021-02-08 DIAGNOSIS — Z87440 Personal history of urinary (tract) infections: Secondary | ICD-10-CM | POA: Diagnosis not present

## 2021-02-08 DIAGNOSIS — D62 Acute posthemorrhagic anemia: Secondary | ICD-10-CM | POA: Diagnosis not present

## 2021-02-08 DIAGNOSIS — K589 Irritable bowel syndrome without diarrhea: Secondary | ICD-10-CM | POA: Diagnosis not present

## 2021-02-08 DIAGNOSIS — M9701XD Periprosthetic fracture around internal prosthetic right hip joint, subsequent encounter: Secondary | ICD-10-CM | POA: Diagnosis not present

## 2021-02-08 DIAGNOSIS — I4891 Unspecified atrial fibrillation: Secondary | ICD-10-CM | POA: Diagnosis not present

## 2021-02-08 DIAGNOSIS — Z86010 Personal history of colonic polyps: Secondary | ICD-10-CM | POA: Diagnosis not present

## 2021-02-08 DIAGNOSIS — K5790 Diverticulosis of intestine, part unspecified, without perforation or abscess without bleeding: Secondary | ICD-10-CM | POA: Diagnosis not present

## 2021-02-08 DIAGNOSIS — E559 Vitamin D deficiency, unspecified: Secondary | ICD-10-CM | POA: Diagnosis not present

## 2021-02-08 DIAGNOSIS — K5900 Constipation, unspecified: Secondary | ICD-10-CM | POA: Diagnosis not present

## 2021-02-08 DIAGNOSIS — I10 Essential (primary) hypertension: Secondary | ICD-10-CM | POA: Diagnosis not present

## 2021-02-08 NOTE — Telephone Encounter (Signed)
OK to proceed with orders as requested.   

## 2021-02-09 NOTE — Telephone Encounter (Signed)
Orders have been given. Nothing further needed.  °

## 2021-02-11 DIAGNOSIS — D509 Iron deficiency anemia, unspecified: Secondary | ICD-10-CM | POA: Diagnosis not present

## 2021-02-11 DIAGNOSIS — I4891 Unspecified atrial fibrillation: Secondary | ICD-10-CM | POA: Diagnosis not present

## 2021-02-11 DIAGNOSIS — D62 Acute posthemorrhagic anemia: Secondary | ICD-10-CM | POA: Diagnosis not present

## 2021-02-11 DIAGNOSIS — M9701XD Periprosthetic fracture around internal prosthetic right hip joint, subsequent encounter: Secondary | ICD-10-CM | POA: Diagnosis not present

## 2021-02-11 DIAGNOSIS — I10 Essential (primary) hypertension: Secondary | ICD-10-CM | POA: Diagnosis not present

## 2021-02-11 DIAGNOSIS — M80051D Age-related osteoporosis with current pathological fracture, right femur, subsequent encounter for fracture with routine healing: Secondary | ICD-10-CM | POA: Diagnosis not present

## 2021-02-11 DIAGNOSIS — Z4789 Encounter for other orthopedic aftercare: Secondary | ICD-10-CM | POA: Diagnosis not present

## 2021-02-16 ENCOUNTER — Ambulatory Visit (INDEPENDENT_AMBULATORY_CARE_PROVIDER_SITE_OTHER): Payer: Medicare Other | Admitting: Family Medicine

## 2021-02-16 ENCOUNTER — Encounter: Payer: Self-pay | Admitting: Family Medicine

## 2021-02-16 ENCOUNTER — Other Ambulatory Visit: Payer: Self-pay

## 2021-02-16 VITALS — BP 128/70 | HR 79 | Wt 118.9 lb

## 2021-02-16 DIAGNOSIS — I10 Essential (primary) hypertension: Secondary | ICD-10-CM

## 2021-02-16 DIAGNOSIS — D649 Anemia, unspecified: Secondary | ICD-10-CM | POA: Diagnosis not present

## 2021-02-16 DIAGNOSIS — R202 Paresthesia of skin: Secondary | ICD-10-CM

## 2021-02-16 LAB — CBC WITH DIFFERENTIAL/PLATELET
Basophils Absolute: 0.1 10*3/uL (ref 0.0–0.1)
Basophils Relative: 0.8 % (ref 0.0–3.0)
Eosinophils Absolute: 0.2 10*3/uL (ref 0.0–0.7)
Eosinophils Relative: 2 % (ref 0.0–5.0)
HCT: 34.9 % — ABNORMAL LOW (ref 36.0–46.0)
Hemoglobin: 12 g/dL (ref 12.0–15.0)
Lymphocytes Relative: 28.8 % (ref 12.0–46.0)
Lymphs Abs: 2.2 10*3/uL (ref 0.7–4.0)
MCHC: 34.3 g/dL (ref 30.0–36.0)
MCV: 93.4 fl (ref 78.0–100.0)
Monocytes Absolute: 0.6 10*3/uL (ref 0.1–1.0)
Monocytes Relative: 8.1 % (ref 3.0–12.0)
Neutro Abs: 4.5 10*3/uL (ref 1.4–7.7)
Neutrophils Relative %: 60.3 % (ref 43.0–77.0)
Platelets: 172 10*3/uL (ref 150.0–400.0)
RBC: 3.74 Mil/uL — ABNORMAL LOW (ref 3.87–5.11)
RDW: 13.2 % (ref 11.5–15.5)
WBC: 7.5 10*3/uL (ref 4.0–10.5)

## 2021-02-16 LAB — TSH: TSH: 2.88 u[IU]/mL (ref 0.35–4.50)

## 2021-02-16 NOTE — Progress Notes (Signed)
Established Patient Office Visit  Subjective:  Patient ID: [name], female    DOB: 30-Dec-1931  Age: 87 y.o. MRN: [id number]  CC:  Chief Complaint  Patient presents with  . Follow-up    HPI [name] presents for medical follow-up.  She had follow-up with hip fracture and hip replacement surgery back in January.  She had very low anemia following surgery with hemoglobin down to 5.6 and received 3 units of packed red blood cells.  She is taking multivitamin with iron but no specific iron supplement.  We discussed checking CBC today.  No dizziness.  No syncope.  Still getting physical therapy.  Ambulating with a cane.  She states she has had some bilateral paresthesias lower extremities since her fall and injury.  Pedis placed her on gabapentin 100 mg nightly but she has had some dizziness even with low-dose.  She describes burning sensation in both lower legs.  No history of diabetes.  Last TSH 2014.  Has had some low back pain but only for a few days.  No appetite loss or any significant weight change.  No fever.  She has hypertension treated with bisoprolol HCTZ.  Blood pressure stable.  No recent headaches or chest pain  Past Medical History:  Diagnosis Date  . Constipation   . Diverticulosis   . Hypertension   . Inguinal hernia, left   . Irritable bowel syndrome   . Osteoporosis   . UTI (lower urinary tract infection)    hx of frequent uti's    Past Surgical History:  Procedure Laterality Date  . APPENDECTOMY    . BREAST SURGERY Left    biopsy  . CERVICAL FUSION    . colon polyps    . HEMORRHOID SURGERY    . HIP SURGERY Right   . INGUINAL HERNIA REPAIR Left 01/13/2017   Procedure: LEFT INGUINAL HERNIA REPAIR WITH MESH;  Surgeon: Jackolyn Confer, MD;  Location: [facility];  Service: General;  Laterality: Left;  . INSERTION OF MESH Left 01/13/2017   Procedure: INSERTION OF MESH;  Surgeon: Jackolyn Confer, MD;  Location: [facility];  Service: General;  Laterality: Left;  . LUMBAR FUSION    . LUMBAR LAMINECTOMY    . partial resection of colon for tumor    . SHOULDER SURGERY Left   . TOTAL HIP ARTHROPLASTY     right  . TOTAL HIP REVISION Right 11/17/2020   Procedure: ORIF of Right periprosthetic fracture;  Surgeon: Paralee Cancel, MD;  Location: [facility];  Service: Orthopedics;  Laterality: Right;    Family History  Problem Relation Age of Onset  . Colon cancer Mother   . Cancer Father   . Lung cancer Father   . Ovarian cancer Sister   . Breast cancer Sister   . Liver cancer Brother   . Heart disease Brother     Social History   Socioeconomic History  . Marital status: Married    Spouse name: Not on file  . Number of children: Not on file  . Years of education: Not on file  . Highest education level: Not on file  Occupational History  . Occupation: retired    Fish farm manager: RETIRED  Tobacco Use  . Smoking status: Never Smoker  . Smokeless tobacco: Never Used  Vaping Use  . Vaping Use: Never used  Substance and Sexual Activity  . Alcohol use: No  . Drug use: No  . Sexual activity: Not Currently  Other  Topics Concern  . 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 Difficulty of Paying Living Expenses: Not hard at all  Food Insecurity: No Food Insecurity  . Worried About Charity fundraiser in the Last Year: Never true  . Ran Out of Food in the Last Year: Never true  Transportation Needs: No Transportation Needs  . Lack of Transportation (Medical): No  . Lack of Transportation (Non-Medical): No  Physical Activity: Sufficiently Active  . Days of Exercise per Week: 7 days  . Minutes of Exercise per Session: 30 min  Stress: No Stress Concern Present  . Feeling of Stress : Not at all  Social Connections: Moderately  Integrated  . Frequency of Communication with Friends and Family: More than three times a week  . Frequency of Social Gatherings with Friends and Family: More than three times a week  . Attends Religious Services: More than 4 times per year  . Active Member of Clubs or Organizations: No  . Attends Archivist Meetings: Never  . Marital Status: Married  Human resources officer Violence: Not At Risk  . Fear of Current or Ex-Partner: No  . Emotionally Abused: No  . Physically Abused: No  . Sexually Abused: No    Outpatient Medications Prior to Visit  Medication Sig Dispense Refill  . b complex vitamins tablet Take 1 tablet by mouth daily.    . bisoprolol-hydrochlorothiazide (ZIAC) 5-6.25 MG tablet TAKE 1 TABLET BY MOUTH DAILY. NEEDS APPT FOR FURTHER REFILLS 90 tablet 0  . Calcium-Vitamin D-Vitamin K (VIACTIV CALCIUM PLUS D) 650-12.5-40 MG-MCG-MCG CHEW As directed 90 tablet 3  . feeding supplement (BOOST / RESOURCE BREEZE) LIQD Take 1 Container by mouth 2 (two) times daily.    Marland Kitchen gabapentin (NEURONTIN) 100 MG capsule Take 100 mg by mouth at bedtime.    . Glucosamine-Chondroitin 250-200 MG TABS Take 1 tablet by mouth daily. 90 tablet 3  . Multiple Minerals-Vitamins (CITRACAL PLUS) TABS Take 1 tablet by mouth daily. 90 tablet 3  . Multiple Vitamin (MULTIVITAMIN WITH MINERALS) TABS tablet Take 1 tablet by mouth daily.    . Omega-3 Fatty Acids (FISH OIL) 1000 MG CAPS Take 2 capsules (2,000 mg total) by mouth 2 (two) times daily. 120 capsule 3  . PRESCRIPTION MEDICATION Take 240 mLs by mouth 2 (two) times daily. MEDPASS    . Vitamin D, Ergocalciferol, (DRISDOL) 1.25 MG (50000 UNIT) CAPS capsule Take 1 capsule (50,000 Units total) by mouth every 7 (seven) days. 12 capsule 2  . HYDROcodone-acetaminophen (NORCO) 7.5-325 MG tablet Take 1 tablet by mouth every 6 (six) hours as needed for severe pain. 20 tablet 0   No facility-administered medications prior to visit.    Allergies  Allergen  Reactions  . Codeine     REACTION: headaches  . Flagyl [Metronidazole Hcl]     nausea    ROS Review of Systems  Constitutional: Negative for fatigue, fever and unexpected weight change.  Eyes: Negative for visual disturbance.  Respiratory: Negative for cough, chest tightness, shortness of breath and wheezing.   Cardiovascular: Negative for chest pain, palpitations and leg swelling.  Musculoskeletal: Positive for back pain.  Neurological: Negative for dizziness, seizures, syncope, weakness, light-headedness and headaches.      Objective:  Physical Exam Constitutional:      Appearance: She is well-developed.  Eyes:     Pupils: Pupils are equal, round, and reactive to light.  Neck:     Thyroid: No thyromegaly.     Vascular: No JVD.  Cardiovascular:     Rate and Rhythm: Normal rate and regular rhythm.     Heart sounds: No gallop.   Pulmonary:     Effort: Pulmonary effort is normal. No respiratory distress.     Breath sounds: Normal breath sounds. No wheezing or rales.  Musculoskeletal:     Cervical back: Neck supple.     Right lower leg: No edema.     Left lower leg: No edema.  Neurological:     Mental Status: She is alert.     BP 128/70 (BP Location: Left Arm, Patient Position: Sitting, Cuff Size: Normal)   Pulse 79   Wt 118 lb 14.4 oz (53.9 kg)   SpO2 98%   BMI 19.19 kg/m  Wt Readings from Last 3 Encounters:  02/16/21 118 lb 14.4 oz (53.9 kg)  12/16/20 120 lb 14.4 oz (54.8 kg)  11/28/20 119 lb 0.8 oz (54 kg)     There are no preventive care reminders to display for this patient.  There are no preventive care reminders to display for this patient.  Lab Results  Component Value Date   TSH 1.71 05/21/2013   Lab Results  Component Value Date   WBC 12.1 (H) 11/28/2020   HGB 9.4 (L) 11/28/2020   HCT 28.7 (L) 11/28/2020   MCV 95.0 11/28/2020   PLT 399 11/28/2020   Lab Results  Component Value Date   NA 138 11/28/2020   K 3.7 11/28/2020   CO2 21 (L)  11/28/2020   GLUCOSE 99 11/28/2020   BUN 26 (H) 11/28/2020   CREATININE 0.56 11/28/2020   BILITOT 0.8 11/15/2020   ALKPHOS 35 (L) 11/15/2020   AST 21 11/15/2020   ALT 11 11/15/2020   PROT 5.2 (L) 11/15/2020   ALBUMIN 3.0 (L) 11/15/2020   CALCIUM 8.5 (L) 11/28/2020   ANIONGAP 14 11/28/2020   GFR 71.15 09/17/2019   Lab Results  Component Value Date   CHOL 205 (H) 05/21/2013   Lab Results  Component Value Date   HDL 58.40 05/21/2013   Lab Results  Component Value Date   LDLCALC 110 (H) 04/14/2012   Lab Results  Component Value Date   TRIG 95.0 05/21/2013   Lab Results  Component Value Date   CHOLHDL 4 05/21/2013   No results found for: HGBA1C    Assessment & Plan:   Problem List Items Addressed This Visit   None   Visit Diagnoses    Postoperative anemia    -  Primary   Relevant Orders   CBC with Differential/Platelet   Paresthesia of both lower extremities       Relevant Orders   TSH   Protein Electrophoresis, Serum     -Recheck CBC with history of postoperative anemia.  If still low hemoglobin will recommend iron supplementation  -Check TSH and serum protein electrophoresis with her paresthesias lower extremities.  She has no history of diabetes.  She did have recent B12 level checked few months ago that was normal.  She does not use any alcohol  No orders of the defined types were placed in this encounter.   Follow-up: Return in about 6 months (around 08/18/2021).    [name], MD

## 2021-02-16 NOTE — Patient Instructions (Signed)
Orthopedic physical assessment (7th ed., pp. 164- 242). Elsevier.">  Paresthesia Paresthesia is an abnormal burning or prickling sensation. It is usually felt in the hands, arms, legs, or feet. However, it may occur in any part of the body. Usually, paresthesia is not painful. It may feel like:  Tingling or numbness.  Buzzing.  Itching. Paresthesia may occur without any clear cause, or it may be caused by:  Breathing too quickly (hyperventilation).  Pressure on a nerve.  An underlying medical condition.  Side effects of a medication.  Nutritional deficiencies.  Exposure to toxic chemicals. Most people experience temporary (transient) paresthesia at some time in their lives. For some people, it may be long-lasting (chronic) because of an underlying medical condition. If you have paresthesia that lasts a long time, you need to be evaluated by your health care provider. Follow these instructions at home: Alcohol use  Do not drink alcohol if: ? Your health care provider tells you not to drink. ? You are pregnant, may be pregnant, or are planning to become pregnant.  If you drink alcohol: ? Limit how much you use to:  0-1 drink a day for women.  0-2 drinks a day for men. ? Be aware of how much alcohol is in your drink. In the [place] one drink equals one 12 oz bottle of beer (355 mL), one 5 oz glass of wine (148 mL), or one 1 oz glass of hard liquor (44 mL).   Nutrition  Eat a healthy diet. This includes: ? Eating foods that are high in fiber, such as fresh fruits and vegetables, whole grains, and beans. ? Limiting foods that are high in fat and processed sugars, such as fried or sweet foods.   General instructions  Take over-the-counter and prescription medicines only as told by your health care provider.  Do not use any products that contain nicotine or tobacco, such as cigarettes and e-cigarettes. These can keep blood from reaching damaged nerves. If you need help quitting,  ask your health care provider.  If you have diabetes, work closely with your health care provider to keep your blood sugar under control.  If you have numbness in your feet: ? Check every day for signs of injury or infection. Watch for redness, warmth, and swelling. ? Wear padded socks and comfortable shoes. These help protect your feet.  Keep all follow-up visits as told by your health care provider. This is important. Contact a health care provider if you:  Have paresthesia that gets worse or does not go away.  Have numbness after an injury.  Have a burning or prickling feeling that gets worse when you walk.  Have pain, cramps, or dizziness, or you faint.  Develop a rash. Get help right away if you:  Feel muscle weakness.  Develop new weakness in an arm or leg.  Have trouble walking or moving.  Have problems with speech, understanding, or vision.  Feel confused.  Cannot control your bladder or bowel movements. Summary  Paresthesia is an abnormal burning or prickling sensation that is usually felt in the hands, arms, legs, or feet. It may also occur in other parts of the body.  Paresthesia may occur without any clear cause, or it may be caused by breathing too quickly (hyperventilation), pressure on a nerve, an underlying medical condition, side effects of a medication, nutritional deficiencies, or exposure to toxic chemicals.  If you have paresthesia that lasts a long time, you need to be evaluated by your health care provider.  This information is not intended to replace advice given to you by your health care provider. Make sure you discuss any questions you have with your health care provider. Document Revised: 07/22/2020 Document Reviewed: 07/22/2020 Elsevier Patient Education  2021 [employer].

## 2021-02-17 DIAGNOSIS — M80051D Age-related osteoporosis with current pathological fracture, right femur, subsequent encounter for fracture with routine healing: Secondary | ICD-10-CM | POA: Diagnosis not present

## 2021-02-17 DIAGNOSIS — I10 Essential (primary) hypertension: Secondary | ICD-10-CM | POA: Diagnosis not present

## 2021-02-17 DIAGNOSIS — M9701XD Periprosthetic fracture around internal prosthetic right hip joint, subsequent encounter: Secondary | ICD-10-CM | POA: Diagnosis not present

## 2021-02-17 DIAGNOSIS — D509 Iron deficiency anemia, unspecified: Secondary | ICD-10-CM | POA: Diagnosis not present

## 2021-02-17 DIAGNOSIS — D62 Acute posthemorrhagic anemia: Secondary | ICD-10-CM | POA: Diagnosis not present

## 2021-02-17 DIAGNOSIS — I4891 Unspecified atrial fibrillation: Secondary | ICD-10-CM | POA: Diagnosis not present

## 2021-02-18 LAB — PROTEIN ELECTROPHORESIS, SERUM
Albumin ELP: 4.1 g/dL (ref 3.8–4.8)
Alpha 1: 0.3 g/dL (ref 0.2–0.3)
Alpha 2: 0.7 g/dL (ref 0.5–0.9)
Beta 2: 0.4 g/dL (ref 0.2–0.5)
Beta Globulin: 0.4 g/dL (ref 0.4–0.6)
Gamma Globulin: 1.1 g/dL (ref 0.8–1.7)
Total Protein: 6.9 g/dL (ref 6.1–8.1)

## 2021-02-24 DIAGNOSIS — I4891 Unspecified atrial fibrillation: Secondary | ICD-10-CM | POA: Diagnosis not present

## 2021-02-24 DIAGNOSIS — M9701XD Periprosthetic fracture around internal prosthetic right hip joint, subsequent encounter: Secondary | ICD-10-CM | POA: Diagnosis not present

## 2021-02-24 DIAGNOSIS — D62 Acute posthemorrhagic anemia: Secondary | ICD-10-CM | POA: Diagnosis not present

## 2021-02-24 DIAGNOSIS — I10 Essential (primary) hypertension: Secondary | ICD-10-CM | POA: Diagnosis not present

## 2021-02-24 DIAGNOSIS — M80051D Age-related osteoporosis with current pathological fracture, right femur, subsequent encounter for fracture with routine healing: Secondary | ICD-10-CM | POA: Diagnosis not present

## 2021-02-24 DIAGNOSIS — D509 Iron deficiency anemia, unspecified: Secondary | ICD-10-CM | POA: Diagnosis not present

## 2021-03-02 DIAGNOSIS — D62 Acute posthemorrhagic anemia: Secondary | ICD-10-CM | POA: Diagnosis not present

## 2021-03-02 DIAGNOSIS — M9701XD Periprosthetic fracture around internal prosthetic right hip joint, subsequent encounter: Secondary | ICD-10-CM | POA: Diagnosis not present

## 2021-03-02 DIAGNOSIS — D509 Iron deficiency anemia, unspecified: Secondary | ICD-10-CM | POA: Diagnosis not present

## 2021-03-02 DIAGNOSIS — I10 Essential (primary) hypertension: Secondary | ICD-10-CM | POA: Diagnosis not present

## 2021-03-02 DIAGNOSIS — M80051D Age-related osteoporosis with current pathological fracture, right femur, subsequent encounter for fracture with routine healing: Secondary | ICD-10-CM | POA: Diagnosis not present

## 2021-03-02 DIAGNOSIS — I4891 Unspecified atrial fibrillation: Secondary | ICD-10-CM | POA: Diagnosis not present

## 2021-03-25 DIAGNOSIS — M9701XD Periprosthetic fracture around internal prosthetic right hip joint, subsequent encounter: Secondary | ICD-10-CM | POA: Diagnosis not present

## 2021-03-25 DIAGNOSIS — M25561 Pain in right knee: Secondary | ICD-10-CM | POA: Diagnosis not present

## 2021-06-03 ENCOUNTER — Other Ambulatory Visit: Payer: Self-pay

## 2021-06-03 ENCOUNTER — Ambulatory Visit (INDEPENDENT_AMBULATORY_CARE_PROVIDER_SITE_OTHER): Payer: Medicare Other | Admitting: Family Medicine

## 2021-06-03 VITALS — BP 132/80 | HR 95 | Temp 97.9°F | Wt 119.7 lb

## 2021-06-03 DIAGNOSIS — I8391 Asymptomatic varicose veins of right lower extremity: Secondary | ICD-10-CM | POA: Diagnosis not present

## 2021-06-03 DIAGNOSIS — R5383 Other fatigue: Secondary | ICD-10-CM | POA: Diagnosis not present

## 2021-06-03 DIAGNOSIS — M818 Other osteoporosis without current pathological fracture: Secondary | ICD-10-CM

## 2021-06-03 NOTE — Patient Instructions (Signed)
Schedule Prolia injection on or after 06/15/21

## 2021-06-03 NOTE — Progress Notes (Signed)
Established Patient Office Visit  Subjective:  Patient ID: [name], female    DOB: [birth year]  Age: 87 y.o. MRN: [id number]  CC:  Chief Complaint  Patient presents with   Cyst    Back of right knee, x 2 days, swollen, no pain, slight discomfort due to size    HPI [name] presents for prominent varicose veins posterior to right knee and involving proximal posterior right leg.  She had noticed these recently.  Really no discomfort.  Her concern was for DVT.  She had right hip surgery last winter and was treated briefly prophylactically with anticoagulants at that time.  No history of DVT.  She stays very active and ambulates regularly.  She has not noted any significant edema around the foot, ankle, or lower leg.  No calf pain.  No thigh pain.  Denies any pleuritic pain or dyspnea.  No chest pain.  She continues to have some nonspecific fatigue issues since her surgery.  She had labs few months ago with normal TSH and hemoglobin improved to 12.0.  Her sleep is somewhat erratic.  She easily can get to sleep but has frequent interruptions and awakening during the night.  She has osteoporosis and is on Prolia injections and has been on these for a few years.  She was inquiring about date of next injection.  She has been approved to get her next one after August 22.  Past Medical History:  Diagnosis Date   Constipation    Diverticulosis    Hypertension    Inguinal hernia, left    Irritable bowel syndrome    Osteoporosis    UTI (lower urinary tract infection)    hx of frequent uti's    Past Surgical History:  Procedure Laterality Date   APPENDECTOMY     BREAST SURGERY Left    biopsy   CERVICAL FUSION     colon polyps     HEMORRHOID SURGERY     HIP SURGERY Right    INGUINAL HERNIA REPAIR Left 01/13/2017   Procedure: LEFT INGUINAL HERNIA REPAIR WITH MESH;  Surgeon: Jackolyn Confer, MD;  Location: [facility];  Service: General;  Laterality:  Left;   INSERTION OF MESH Left 01/13/2017   Procedure: INSERTION OF MESH;  Surgeon: Jackolyn Confer, MD;  Location: [facility];  Service: General;  Laterality: Left;   LUMBAR FUSION     LUMBAR LAMINECTOMY     partial resection of colon for tumor     SHOULDER SURGERY Left    TOTAL HIP ARTHROPLASTY     right   TOTAL HIP REVISION Right 11/17/2020   Procedure: ORIF of Right periprosthetic fracture;  Surgeon: Paralee Cancel, MD;  Location: [facility];  Service: Orthopedics;  Laterality: Right;    Family History  Problem Relation Age of Onset   Colon cancer Mother    Cancer Father    Lung cancer Father    Ovarian cancer Sister    Breast cancer Sister    Liver cancer Brother    Heart disease Brother     Social History   Socioeconomic History   Marital status: Married    Spouse name: Not on file   Number of children: Not on file   Years of education: Not on file   Highest education level: Not on file  Occupational History   Occupation: retired    Fish farm manager: RETIRED  Tobacco Use   Smoking status: Never   Smokeless tobacco: Never  Vaping Use   Vaping Use: Never used  Substance and Sexual Activity   Alcohol use: No   Drug use: No   Sexual activity: Not Currently  Other Topics Concern   Not on file  Social History Narrative   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   Married x 35 years   1 child   2 grand and 1.5 great grands    Social Determinants of Health   Financial Resource Strain: Low Risk    Difficulty of Paying Living Expenses: Not hard at all  Food Insecurity: No Food Insecurity   Worried About Charity fundraiser in the Last Year: Never true   Arboriculturist in the Last Year: Never true  Transportation Needs: No Transportation Needs   Lack of Transportation (Medical): No   Lack of Transportation (Non-Medical): No  Physical Activity: Sufficiently Active   Days of Exercise per Week: 7 days   Minutes of Exercise per Session: 30 min  Stress: No Stress  Concern Present   Feeling of Stress : Not at all  Social Connections: Moderately Integrated   Frequency of Communication with Friends and Family: More than three times a week   Frequency of Social Gatherings with Friends and Family: More than three times a week   Attends Religious Services: More than 4 times per year   Active Member of [employer] or Organizations: No   Attends Archivist Meetings: Never   Marital Status: Married  Human resources officer Violence: Not At Risk   Fear of Current or Ex-Partner: No   Emotionally Abused: No   Physically Abused: No   Sexually Abused: No    Outpatient Medications Prior to Visit  Medication Sig Dispense Refill   b complex vitamins tablet Take 1 tablet by mouth daily.     bisoprolol-hydrochlorothiazide (ZIAC) 5-6.25 MG tablet TAKE 1 TABLET BY MOUTH DAILY. NEEDS APPT FOR FURTHER REFILLS 90 tablet 0   Calcium-Vitamin D-Vitamin K (VIACTIV CALCIUM PLUS D) 650-12.5-40 MG-MCG-MCG CHEW As directed 90 tablet 3   feeding supplement (BOOST / RESOURCE BREEZE) LIQD Take 1 Container by mouth 2 (two) times daily.     gabapentin (NEURONTIN) 100 MG capsule Take 100 mg by mouth at bedtime.     Glucosamine-Chondroitin 250-200 MG TABS Take 1 tablet by mouth daily. 90 tablet 3   Multiple Minerals-Vitamins (CITRACAL PLUS) TABS Take 1 tablet by mouth daily. 90 tablet 3   Multiple Vitamin (MULTIVITAMIN WITH MINERALS) TABS tablet Take 1 tablet by mouth daily.     Omega-3 Fatty Acids (FISH OIL) 1000 MG CAPS Take 2 capsules (2,000 mg total) by mouth 2 (two) times daily. 120 capsule 3   PRESCRIPTION MEDICATION Take 240 mLs by mouth 2 (two) times daily. MEDPASS     Vitamin D, Ergocalciferol, (DRISDOL) 1.25 MG (50000 UNIT) CAPS capsule Take 1 capsule (50,000 Units total) by mouth every 7 (seven) days. 12 capsule 2   No facility-administered medications prior to visit.    Allergies  Allergen Reactions   Codeine     REACTION: headaches   Flagyl [Metronidazole Hcl]      nausea    ROS Review of Systems  Constitutional:  Positive for fatigue. Negative for appetite change, chills, fever and unexpected weight change.  Respiratory:  Negative for cough and shortness of breath.   Cardiovascular:  Negative for chest pain, palpitations and leg swelling.  Genitourinary:  Negative for dysuria.  Neurological:  Negative for headaches.  Hematological:  Negative for adenopathy.  Objective:    Physical Exam Vitals reviewed.  Constitutional:      Appearance: Normal appearance.  Cardiovascular:     Rate and Rhythm: Normal rate and regular rhythm.  Pulmonary:     Effort: Pulmonary effort is normal.     Breath sounds: Normal breath sounds.  Musculoskeletal:     Right lower leg: No edema.     Left lower leg: No edema.  Skin:    Comments: She has some prominent varicose veins posterior right leg proximally just inferior to the knee.  No overlying erythema.  Nontender to palpation.  She does have numerous varicose veins lower extremities right leg greater than left but no significant asymmetric edema.  No calf tenderness.  Both feet are warm to touch.  She has palpable distal pulses and excellent capillary refill.  No leg ulcerations.  Neurological:     Mental Status: She is alert.    BP 132/80 (BP Location: Left Arm, Patient Position: Sitting, Cuff Size: Normal)   Pulse 95   Temp 97.9 F (36.6 C) (Oral)   Wt 119 lb 11.2 oz (54.3 kg)   SpO2 98%   BMI 19.32 kg/m  Wt Readings from Last 3 Encounters:  06/03/21 119 lb 11.2 oz (54.3 kg)  02/16/21 118 lb 14.4 oz (53.9 kg)  12/16/20 120 lb 14.4 oz (54.8 kg)     Health Maintenance Due  Topic Date Due   Zoster Vaccines- Shingrix (1 of 2) Never done   COVID-19 Vaccine (4 - Booster for Pfizer series) 12/08/2020   INFLUENZA VACCINE  05/25/2021    There are no preventive care reminders to display for this patient.  Lab Results  Component Value Date   TSH 2.88 02/16/2021   Lab Results  Component Value  Date   WBC 7.5 02/16/2021   HGB 12.0 02/16/2021   HCT 34.9 (L) 02/16/2021   MCV 93.4 02/16/2021   PLT 172.0 02/16/2021   Lab Results  Component Value Date   NA 138 11/28/2020   K 3.7 11/28/2020   CO2 21 (L) 11/28/2020   GLUCOSE 99 11/28/2020   BUN 26 (H) 11/28/2020   CREATININE 0.56 11/28/2020   BILITOT 0.8 11/15/2020   ALKPHOS 35 (L) 11/15/2020   AST 21 11/15/2020   ALT 11 11/15/2020   PROT 6.9 02/16/2021   ALBUMIN 3.0 (L) 11/15/2020   CALCIUM 8.5 (L) 11/28/2020   ANIONGAP 14 11/28/2020   GFR 71.15 09/17/2019   Lab Results  Component Value Date   CHOL 205 (H) 05/21/2013   Lab Results  Component Value Date   HDL 58.40 05/21/2013   Lab Results  Component Value Date   LDLCALC 110 (H) 04/14/2012   Lab Results  Component Value Date   TRIG 95.0 05/21/2013   Lab Results  Component Value Date   CHOLHDL 4 05/21/2013   No results found for: HGBA1C    Assessment & Plan:   #1 prominent superficial varicose veins right proximal leg posteriorly.  No evidence for superficial phlebitis.  She does not have any evidence clinically to suggest DVT such as significant edema or any calf pain.  -We discussed predominant mode of treatment being compression.  These are in a tricky location almost more posterior to the knee.  She would probably have to wear thigh-high compression but she declines at this time.  Follow-up for any increased pain or other concerns -Consider venous Dopplers for any progressive edema or other concerns  #2 fatigue issues.  Recent hemoglobin improved and  TSH normal.  Suspect related to poor sleep quality. -Sleep hygiene discussed -Try to engage in regular exercise with walking  #3 osteoporosis.  Patient on Prolia injections. -We did check today and she has been approved for her next injection and will need to get this after August 22. -We reviewed her last DEXA from 2019 with T score -3.6 of the radius   No orders of the defined types were placed in  this encounter.   Follow-up: No follow-ups on file.    [name], MD

## 2021-06-16 ENCOUNTER — Other Ambulatory Visit: Payer: Self-pay

## 2021-06-16 ENCOUNTER — Ambulatory Visit (INDEPENDENT_AMBULATORY_CARE_PROVIDER_SITE_OTHER): Payer: Medicare Other | Admitting: *Deleted

## 2021-06-16 DIAGNOSIS — M818 Other osteoporosis without current pathological fracture: Secondary | ICD-10-CM | POA: Diagnosis not present

## 2021-06-16 MED ORDER — DENOSUMAB 60 MG/ML ~~LOC~~ SOSY
60.0000 mg | PREFILLED_SYRINGE | Freq: Once | SUBCUTANEOUS | Status: AC
  Administered 2021-06-16: 60 mg via SUBCUTANEOUS

## 2021-06-16 NOTE — Progress Notes (Signed)
Per orders of Dr. Elease Hashimoto, injection of Prolia '60mg'$  given by Agnes Lawrence. Patient tolerated injection well.

## 2021-07-04 ENCOUNTER — Other Ambulatory Visit: Payer: Self-pay | Admitting: Family Medicine

## 2021-07-11 ENCOUNTER — Other Ambulatory Visit: Payer: Self-pay | Admitting: Family Medicine

## 2021-08-26 DIAGNOSIS — H2513 Age-related nuclear cataract, bilateral: Secondary | ICD-10-CM | POA: Diagnosis not present

## 2021-10-03 ENCOUNTER — Other Ambulatory Visit: Payer: Self-pay | Admitting: Family Medicine

## 2021-10-08 DIAGNOSIS — H2513 Age-related nuclear cataract, bilateral: Secondary | ICD-10-CM | POA: Diagnosis not present

## 2021-10-09 ENCOUNTER — Telehealth: Payer: Self-pay | Admitting: Family Medicine

## 2021-10-09 NOTE — Chronic Care Management (AMB) (Signed)
°  Chronic Care Management   Note  10/09/2021 Name: [name] MRN: [id number] DOB: [birth year]  [name] is a 87 y.o. year old female who is a primary care patient of Burchette, Alinda Sierras, MD. I reached out to [name] by phone today in response to a referral sent by Ms. Juliette Alcide PCP, Eulas Post, MD.   [name] was given information about Chronic Care Management services today including:  CCM service includes personalized support from designated clinical staff supervised by her physician, including individualized plan of care and coordination with other care providers 24/7 contact phone numbers for assistance for urgent and routine care needs. Service will only be billed when office clinical staff spend 20 minutes or more in a month to coordinate care. Only one practitioner may furnish and bill the service in a calendar month. The patient may stop CCM services at any time (effective at the end of the month) by phone call to the office staff.   Patient agreed to services and verbal consent obtained.   Follow up plan:   Tatjana Secretary/administrator

## 2021-10-28 ENCOUNTER — Ambulatory Visit (INDEPENDENT_AMBULATORY_CARE_PROVIDER_SITE_OTHER): Payer: Medicare Other

## 2021-10-28 ENCOUNTER — Ambulatory Visit: Payer: Medicare Other

## 2021-10-28 VITALS — Ht 61.0 in | Wt 119.0 lb

## 2021-10-28 DIAGNOSIS — Z0000 Encounter for general adult medical examination without abnormal findings: Secondary | ICD-10-CM | POA: Diagnosis not present

## 2021-10-28 NOTE — Progress Notes (Signed)
Subjective:   [name] is a 88 y.o. female who presents for Medicare Annual (Subsequent) preventive examination.  Review of Systems    No ROS      Objective:    There were no vitals filed for this visit. There is no height or weight on file to calculate BMI.  Advanced Directives 11/28/2020 11/17/2020 11/14/2020 10/27/2020 05/23/2018 01/13/2017 01/05/2017  Does Patient Have a Medical Advance Directive? No Yes Yes Yes Yes Yes Yes  Type of Advance Directive - [place];Living will [place];Living will [place];Living will - [place];Living will [place];Living will  Does patient want to make changes to medical advance directive? No - Patient declined - No - Patient declined No - Patient declined - No - Patient declined No - Patient declined  Copy of [place] in Chart? - No - copy requested No - copy requested No - copy requested - No - copy requested No - copy requested  Would patient like information on creating a medical advance directive? No - Patient declined - - - - No - Patient declined No - Patient declined    Current Medications (verified) Outpatient Encounter Medications as of 10/28/2021  Medication Sig   b complex vitamins tablet Take 1 tablet by mouth daily.   bisoprolol-hydrochlorothiazide (ZIAC) 5-6.25 MG tablet TAKE 1 TABLET BY MOUTH DAILY. NEEDS APPT FOR FURTHER REFILLS   Calcium-Vitamin D-Vitamin K (VIACTIV CALCIUM PLUS D) 650-12.5-40 MG-MCG-MCG CHEW As directed   feeding supplement (BOOST / RESOURCE BREEZE) LIQD Take 1 Container by mouth 2 (two) times daily.   gabapentin (NEURONTIN) 100 MG capsule Take 100 mg by mouth at bedtime.   Glucosamine-Chondroitin 250-200 MG TABS Take 1 tablet by mouth daily.   Multiple Minerals-Vitamins (CITRACAL PLUS) TABS Take 1 tablet by mouth daily.   Multiple Vitamin (MULTIVITAMIN WITH MINERALS) TABS tablet Take 1  tablet by mouth daily.   Omega-3 Fatty Acids (FISH OIL) 1000 MG CAPS Take 2 capsules (2,000 mg total) by mouth 2 (two) times daily.   PRESCRIPTION MEDICATION Take 240 mLs by mouth 2 (two) times daily. MEDPASS   Vitamin D, Ergocalciferol, (DRISDOL) 1.25 MG (50000 UNIT) CAPS capsule TAKE 1 CAPSULE (50,000 UNITS TOTAL) BY MOUTH EVERY 7 (SEVEN) DAYS   No facility-administered encounter medications on file as of 10/28/2021.    Allergies (verified) Codeine and Flagyl [metronidazole hcl]   History: Past Medical History:  Diagnosis Date   Constipation    Diverticulosis    Hypertension    Inguinal hernia, left    Irritable bowel syndrome    Osteoporosis    UTI (lower urinary tract infection)    hx of frequent uti's   Past Surgical History:  Procedure Laterality Date   APPENDECTOMY     BREAST SURGERY Left    biopsy   CERVICAL FUSION     colon polyps     HEMORRHOID SURGERY     HIP SURGERY Right    INGUINAL HERNIA REPAIR Left 01/13/2017   Procedure: LEFT INGUINAL HERNIA REPAIR WITH MESH;  Surgeon: Jackolyn Confer, MD;  Location: [facility];  Service: General;  Laterality: Left;   INSERTION OF MESH Left 01/13/2017   Procedure: INSERTION OF MESH;  Surgeon: Jackolyn Confer, MD;  Location: [facility];  Service: General;  Laterality: Left;   LUMBAR FUSION     LUMBAR LAMINECTOMY     partial resection of colon for tumor  SHOULDER SURGERY Left    TOTAL HIP ARTHROPLASTY     right   TOTAL HIP REVISION Right 11/17/2020   Procedure: ORIF of Right periprosthetic fracture;  Surgeon: Paralee Cancel, MD;  Location: [facility];  Service: Orthopedics;  Laterality: Right;   Family History  Problem Relation Age of Onset   Colon cancer Mother    Cancer Father    Lung cancer Father    Ovarian cancer Sister    Breast cancer Sister    Liver cancer Brother    Heart disease Brother    Social History   Socioeconomic History   Marital status: Married    Spouse name: Not on  file   Number of children: Not on file   Years of education: Not on file   Highest education level: Not on file  Occupational History   Occupation: retired    Fish farm manager: RETIRED  Tobacco Use   Smoking status: Never   Smokeless tobacco: Never  Vaping Use   Vaping Use: Never used  Substance and Sexual Activity   Alcohol use: No   Drug use: No   Sexual activity: Not Currently  Other Topics Concern   Not on file  Social History Narrative   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 No      Activities of Daily Living In your present state of health, do you have any difficulty performing the following activities: 11/14/2020 11/14/2020  Hearing? - N  Vision? - N  Difficulty concentrating or making decisions? - N  Walking or climbing stairs? - Y  Dressing or bathing? - Y  Doing errands, shopping? Y -  Some recent data might be hidden    Patient Care Team: Eulas Post, MD as PCP - General (Family Medicine) Viona Gilmore, [facility] as Pharmacist (Pharmacist)  Indicate any recent Medical Services you may have received from other than [facility] providers in the past year (date may be approximate).     Assessment:   This is a routine wellness examination for [place].  Virtual Visit via Telephone Note  I connected with  [name] on 10/28/21 at  1:00 PM EST by telephone and verified that I am speaking with the correct person using two identifiers.  Location: Patient: Home Provider: Office Persons participating in the virtual visit: patient/Nurse Health Advisor   I discussed the limitations, risks, security and privacy concerns of performing an  evaluation and management service by telephone and the availability of in person appointments. The patient expressed understanding and agreed to proceed.  Interactive audio and video telecommunications were attempted between this nurse and patient, however failed, due to patient having technical difficulties OR patient did not have access to video capability.  We continued and completed visit with audio only.  Some vital signs may be absent or patient reported.   Criselda Peaches, LPN   Hearing/Vision screen No results found.  Dietary issues and exercise activities discussed:     Goals Addressed   None    Depression Screen PHQ 2/9 Scores 10/27/2020 05/23/2018 12/01/2016 11/24/2015 11/24/2015 11/22/2014 09/14/2014  PHQ - 2  Score 0 0 0 0 0 0 0  PHQ- 9 Score 0 - - - - - -    Fall Risk Fall Risk  10/27/2020 08/05/2020 09/18/2019 06/22/2018 05/23/2018  Falls in the past year? 1 0 0 Yes Yes  Comment - - Emmi Telephone Survey: data to providers prior to load - -  Number falls in past yr: 0 0 - 1 1  Comment - - - - only tripped x 1   Injury with Fall? 0 0 - Yes No  Risk for fall due to : No Fall Risks - - - -  Follow up Falls evaluation completed;Falls prevention discussed - - - Education provided    FALL RISK PREVENTION PERTAINING TO THE HOME:  Any stairs in or around the home? Yes  If so, are there any without handrails? Yes  Home free of loose throw rugs in walkways, pet beds, electrical cords, etc? Yes  Adequate lighting in your home to reduce risk of falls? Yes   ASSISTIVE DEVICES UTILIZED TO PREVENT FALLS:  Life alert? No  Use of a cane, walker or w/c? No  Grab bars in the bathroom? Yes  Shower chair or bench in shower? Yes  Elevated toilet seat or a handicapped toilet? Yes  TIMED UP AND GO:  Was the test performed? No . Audio Visit  Cognitive Function: MMSE - Mini Mental State Exam 05/23/2018  Not completed: (No Data)     6CIT Screen 10/27/2020 12/01/2016  What Year? 0 points  0 points  What month? 0 points 0 points  What time? 0 points 0 points  Count back from 20 0 points 0 points  Months in reverse 0 points 0 points  Repeat phrase 6 points 2 points  Total Score 6 2    Immunizations Immunization History  Administered Date(s) Administered   H1N1 10/02/2008   Influenza Split 09/30/2011, 10/10/2012   Influenza Whole 09/04/2007, 07/30/2008, 10/08/2009, 09/11/2010   Influenza, High Dose Seasonal PF 07/28/2017   Influenza,inj,Quad PF,6+ Mos 07/11/2013, 09/18/2018, 07/27/2019, 08/05/2020   Influenza-Unspecified 09/11/2014, 08/25/2016, 08/04/2020   PFIZER(Purple Top)SARS-COV-2 Vaccination 02/04/2020, 02/27/2020, 09/07/2020   Pneumococcal Conjugate-13 11/22/2014   Pneumococcal Polysaccharide-23 12/04/2007   Td 04/02/2008   Zoster, Live 11/26/2013    TDAP status: Due, Education has been provided regarding the importance of this vaccine. Advised may receive this vaccine at local pharmacy or Health Dept. Aware to provide a copy of the vaccination record if obtained from local pharmacy or Health Dept. Verbalized acceptance and understanding.  Flu Vaccine status: Due, Education has been provided regarding the importance of this vaccine. Advised may receive this vaccine at local pharmacy or Health Dept. Aware to provide a copy of the vaccination record if obtained from local pharmacy or Health Dept. Verbalized acceptance and understanding.   Covid-19 vaccine status: Declined, Education has been provided regarding the importance of this vaccine but patient still declined. Advised may receive this vaccine at local pharmacy or Health Dept.or vaccine clinic. Aware to provide a copy of the vaccination record if obtained from local pharmacy or Health Dept. Verbalized acceptance and understanding.  Qualifies for Shingles Vaccine? Yes   Zostavax completed No   Shingrix Completed?: No.    Education has been provided regarding the importance of this vaccine. Patient has been  advised to call insurance company to determine out of pocket expense if they have not yet received this vaccine. Advised may also receive vaccine at local pharmacy or Health Dept. Verbalized acceptance and understanding.  Screening  Tests Health Maintenance  Topic Date Due   Zoster Vaccines- Shingrix (1 of 2) Never done   TETANUS/TDAP  04/02/2018   COVID-19 Vaccine (4 - Booster for Pfizer series) 11/02/2020   INFLUENZA VACCINE  05/25/2021   Pneumonia Vaccine 34+ Years old  Completed   DEXA SCAN  Completed   HPV VACCINES  Aged Out    Health Maintenance  Health Maintenance Due  Topic Date Due   Zoster Vaccines- Shingrix (1 of 2) Never done   TETANUS/TDAP  04/02/2018   COVID-19 Vaccine (4 - Booster for Pfizer series) 11/02/2020   INFLUENZA VACCINE  05/25/2021    Additional Screening:   Vision Screening: Recommended annual ophthalmology exams for early detection of glaucoma and other disorders of the eye. Is the patient up to date with their annual eye exam?  Yes  Who is the provider or what is the name of the office in which the patient attends annual eye exams? Followed by Dr Sherral Hammers  Dental Screening: Recommended annual dental exams for proper oral hygiene  Community Resource Referral / Chronic Care Management:  CRR required this visit?  No   CCM required this visit?  No      Plan:     I have personally reviewed and noted the following in the patients chart:   Medical and social history Use of alcohol, tobacco or illicit drugs  Current medications and supplements including opioid prescriptions. Patient currently not taking opioids Functional ability and status Nutritional status Physical activity Advanced directives List of other physicians Hospitalizations, surgeries, and ER visits in previous 12 months Vitals Screenings to include cognitive, depression, and falls Referrals and appointments  In addition, I have reviewed and discussed with patient certain  preventive protocols, quality metrics, and best practice recommendations. A written personalized care plan for preventive services as well as general preventive health recommendations were provided to patient.     Criselda Peaches, LPN   10/31/5535

## 2021-10-28 NOTE — Patient Instructions (Signed)
[name] , Thank you for taking time to come for your Medicare Wellness Visit. I appreciate your ongoing commitment to your health goals. Please review the following plan we discussed and let me know if I can assist you in the future.   These are the goals we discussed:  Goals      Patient Stated     To maintain the same plan.      stay independent     Keep exercising, stay engaged in activities and food banks Keep volunteering         This is a list of the screening recommended for you and due dates:  Health Maintenance  Topic Date Due   COVID-19 Vaccine (4 - Booster for Pfizer series) 11/13/2021*   Flu Shot  01/22/2022*   Zoster (Shingles) Vaccine (1 of 2) 01/26/2022*   Tetanus Vaccine  10/28/2022*   Pneumonia Vaccine  Completed   DEXA scan (bone density measurement)  Completed   HPV Vaccine  Aged Out  *Topic was postponed. The date shown is not the original due date.    Advanced directives: Yes  Conditions/risks identified: None  Next appointment: Follow up in one year for your annual wellness visit   Preventive Care 65 Years and Older, Female Preventive care refers to lifestyle choices and visits with your health care provider that can promote health and wellness. What does preventive care include? A yearly physical exam. This is also called an annual well check. Dental exams once or twice a year. Routine eye exams. Ask your health care provider how often you should have your eyes checked. Personal lifestyle choices, including: Daily care of your teeth and gums. Regular physical activity. Eating a healthy diet. Avoiding tobacco and drug use. Limiting alcohol use. Practicing safe sex. Taking low-dose aspirin every day. Taking vitamin and mineral supplements as recommended by your health care provider. What happens during an annual well check? The services and screenings done by your health care provider during your annual well check will depend on your  age, overall health, lifestyle risk factors, and family history of disease. Counseling  Your health care provider may ask you questions about your: Alcohol use. Tobacco use. Drug use. Emotional well-being. Home and relationship well-being. Sexual activity. Eating habits. History of falls. Memory and ability to understand (cognition). Work and work Statistician. Reproductive health. Screening  You may have the following tests or measurements: Height, weight, and BMI. Blood pressure. Lipid and cholesterol levels. These may be checked every 5 years, or more frequently if you are over 9 years old. Skin check. Lung cancer screening. You may have this screening every year starting at age 79 if you have a 30-pack-year history of smoking and currently smoke or have quit within the past 15 years. Fecal occult blood test (FOBT) of the stool. You may have this test every year starting at age 6. Flexible sigmoidoscopy or colonoscopy. You may have a sigmoidoscopy every 5 years or a colonoscopy every 10 years starting at age 38. Hepatitis C blood test. Hepatitis B blood test. Sexually transmitted disease (STD) testing. Diabetes screening. This is done by checking your blood sugar (glucose) after you have not eaten for a while (fasting). You may have this done every 1-3 years. Bone density scan. This is done to screen for osteoporosis. You may have this done starting at age 59. Mammogram. This may be done every 1-2 years. Talk to your health care provider about how often you should have regular mammograms. Talk  with your health care provider about your test results, treatment options, and if necessary, the need for more tests. Vaccines  Your health care provider may recommend certain vaccines, such as: Influenza vaccine. This is recommended every year. Tetanus, diphtheria, and acellular pertussis (Tdap, Td) vaccine. You may need a Td booster every 10 years. Zoster vaccine. You may need this after  age 22. Pneumococcal 13-valent conjugate (PCV13) vaccine. One dose is recommended after age 14. Pneumococcal polysaccharide (PPSV23) vaccine. One dose is recommended after age 29. Talk to your health care provider about which screenings and vaccines you need and how often you need them. This information is not intended to replace advice given to you by your health care provider. Make sure you discuss any questions you have with your health care provider. Document Released: 11/07/2015 Document Revised: 06/30/2016 Document Reviewed: 08/12/2015 Elsevier Interactive Patient Education  2017 [place] Prevention in the Home Falls can cause injuries. They can happen to people of all ages. There are many things you can do to make your home safe and to help prevent falls. What can I do on the outside of my home? Regularly fix the edges of walkways and driveways and fix any cracks. Remove anything that might make you trip as you walk through a door, such as a raised step or threshold. Trim any bushes or trees on the path to your home. Use bright outdoor lighting. Clear any walking paths of anything that might make someone trip, such as rocks or tools. Regularly check to see if handrails are loose or broken. Make sure that both sides of any steps have handrails. Any raised decks and porches should have guardrails on the edges. Have any leaves, snow, or ice cleared regularly. Use sand or salt on walking paths during winter. Clean up any spills in your garage right away. This includes oil or grease spills. What can I do in the bathroom? Use night lights. Install grab bars by the toilet and in the tub and shower. Do not use towel bars as grab bars. Use non-skid mats or decals in the tub or shower. If you need to sit down in the shower, use a plastic, non-slip stool. Keep the floor dry. Clean up any water that spills on the floor as soon as it happens. Remove soap buildup in the tub or shower  regularly. Attach bath mats securely with double-sided non-slip rug tape. Do not have throw rugs and other things on the floor that can make you trip. What can I do in the bedroom? Use night lights. Make sure that you have a light by your bed that is easy to reach. Do not use any sheets or blankets that are too big for your bed. They should not hang down onto the floor. Have a firm chair that has side arms. You can use this for support while you get dressed. Do not have throw rugs and other things on the floor that can make you trip. What can I do in the kitchen? Clean up any spills right away. Avoid walking on wet floors. Keep items that you use a lot in easy-to-reach places. If you need to reach something above you, use a strong step stool that has a grab bar. Keep electrical cords out of the way. Do not use floor polish or wax that makes floors slippery. If you must use wax, use non-skid floor wax. Do not have throw rugs and other things on the floor that can make you  trip. What can I do with my stairs? Do not leave any items on the stairs. Make sure that there are handrails on both sides of the stairs and use them. Fix handrails that are broken or loose. Make sure that handrails are as long as the stairways. Check any carpeting to make sure that it is firmly attached to the stairs. Fix any carpet that is loose or worn. Avoid having throw rugs at the top or bottom of the stairs. If you do have throw rugs, attach them to the floor with carpet tape. Make sure that you have a light switch at the top of the stairs and the bottom of the stairs. If you do not have them, ask someone to add them for you. What else can I do to help prevent falls? Wear shoes that: Do not have high heels. Have rubber bottoms. Are comfortable and fit you well. Are closed at the toe. Do not wear sandals. If you use a stepladder: Make sure that it is fully opened. Do not climb a closed stepladder. Make sure that  both sides of the stepladder are locked into place. Ask someone to hold it for you, if possible. Clearly mark and make sure that you can see: Any grab bars or handrails. First and last steps. Where the edge of each step is. Use tools that help you move around (mobility aids) if they are needed. These include: Canes. Walkers. Scooters. Crutches. Turn on the lights when you go into a dark area. Replace any light bulbs as soon as they burn out. Set up your furniture so you have a clear path. Avoid moving your furniture around. If any of your floors are uneven, fix them. If there are any pets around you, be aware of where they are. Review your medicines with your doctor. Some medicines can make you feel dizzy. This can increase your chance of falling. Ask your doctor what other things that you can do to help prevent falls. This information is not intended to replace advice given to you by your health care provider. Make sure you discuss any questions you have with your health care provider. Document Released: 08/07/2009 Document Revised: 03/18/2016 Document Reviewed: 11/15/2014 Elsevier Interactive Patient Education  2017 [employer].

## 2021-11-03 ENCOUNTER — Telehealth: Payer: Self-pay | Admitting: Pharmacist

## 2021-11-03 NOTE — Chronic Care Management (AMB) (Signed)
° ° °  Chronic Care Management Pharmacy Assistant   Name: [name]  MRN: [id number] DOB: 1932/02/12  Call to patient per Thurmond Butts to reschedule initial appointment with [name] Clinical Pharmacist.   Patient reports she has bible study in Wed mornings and would like another day or time, offered her an appointment  via phone on 12/15/21 , she accepted.   Medications: Outpatient Encounter Medications as of 11/03/2021  Medication Sig Note   b complex vitamins tablet Take 1 tablet by mouth daily.    bisoprolol-hydrochlorothiazide (ZIAC) 5-6.25 MG tablet TAKE 1 TABLET BY MOUTH DAILY. NEEDS APPT FOR FURTHER REFILLS    Calcium-Vitamin D-Vitamin K (VIACTIV CALCIUM PLUS D) 650-12.5-40 MG-MCG-MCG CHEW As directed    feeding supplement (BOOST / RESOURCE BREEZE) LIQD Take 1 Container by mouth 2 (two) times daily. 11/28/2020: 0900/2100   gabapentin (NEURONTIN) 100 MG capsule Take 100 mg by mouth at bedtime.    Glucosamine-Chondroitin 250-200 MG TABS Take 1 tablet by mouth daily.    Multiple Minerals-Vitamins (CITRACAL PLUS) TABS Take 1 tablet by mouth daily.    Multiple Vitamin (MULTIVITAMIN WITH MINERALS) TABS tablet Take 1 tablet by mouth daily.    Omega-3 Fatty Acids (FISH OIL) 1000 MG CAPS Take 2 capsules (2,000 mg total) by mouth 2 (two) times daily. 11/28/2020: 0900/2100   PRESCRIPTION MEDICATION Take 240 mLs by mouth 2 (two) times daily. MEDPASS 11/28/2020: 0900/2100   Vitamin D, Ergocalciferol, (DRISDOL) 1.25 MG (50000 UNIT) CAPS capsule TAKE 1 CAPSULE (50,000 UNITS TOTAL) BY MOUTH EVERY 7 (SEVEN) DAYS    No facility-administered encounter medications on file as of 11/03/2021.    Care Gaps: BP- 132/80 ( 06/03/21) AWV - 1/23 Star Rating Drugs: None   Ned Clines [place] Clinical Pharmacist Assistant [phone]

## 2021-11-09 ENCOUNTER — Telehealth: Payer: Self-pay | Admitting: Family Medicine

## 2021-11-09 NOTE — Telephone Encounter (Signed)
Patient brought in paperwork that she needs Dr.Burchette to complete. The paperwork would be placed in the folder.  The paperwork could be mailed to the address on file when completed.  The address is : [address] [place] [address].  Patient could be contacted at [phone].  Please advise.

## 2021-11-10 NOTE — Telephone Encounter (Signed)
Form placed in Dr. Anastasio Auerbach red folder.

## 2021-11-12 NOTE — Telephone Encounter (Signed)
Forms have been completed and mailed to the patient per patient request.  Copy sent to scan

## 2021-12-02 ENCOUNTER — Telehealth: Payer: Medicare Other

## 2021-12-03 IMAGING — CT CT ABD-PELV W/ CM
2 of 5 series · 16 of 46 positions shown, 18 images · IV contrast (omnipaque)
Comparison: 06/09/2006

CLINICAL DATA: 06/09/2006

EXAM:
CT ABDOMEN AND PELVIS WITH CONTRAST
TECHNIQUE: Multidetector CT imaging of the abdomen and pelvis was performed
using the standard protocol following bolus administration of
intravenous contrast.
CONTRAST:  100mL OMNIPAQUE IOHEXOL 300 MG/ML  SOLN

[Series 2: axial (person_name) · axial · 0.70mm/px · z∈[+997,+1347]mm · 13 of 82 slices shown, 15 images]
[im 6/82  soft-tissue]
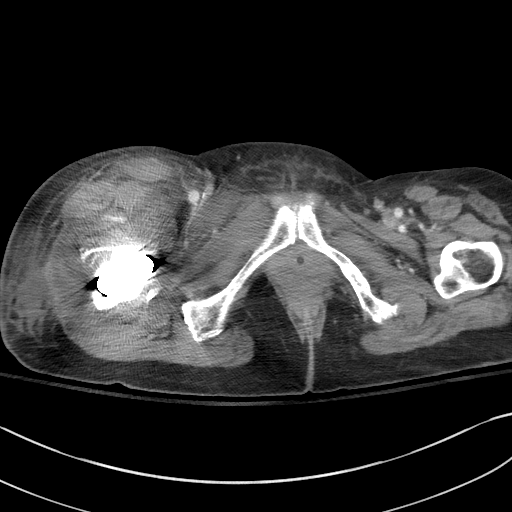
[im 6/82  bone]
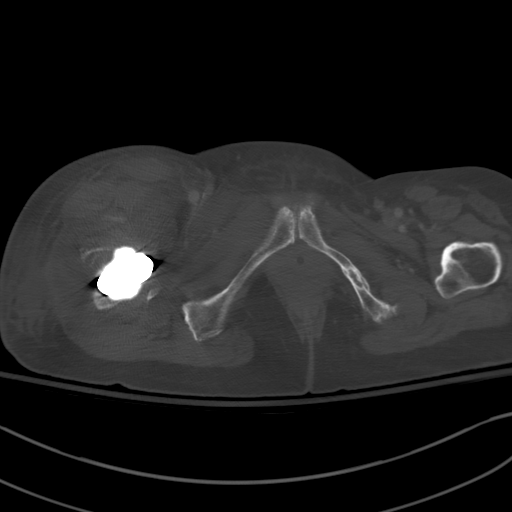
[im 11/82  soft-tissue]
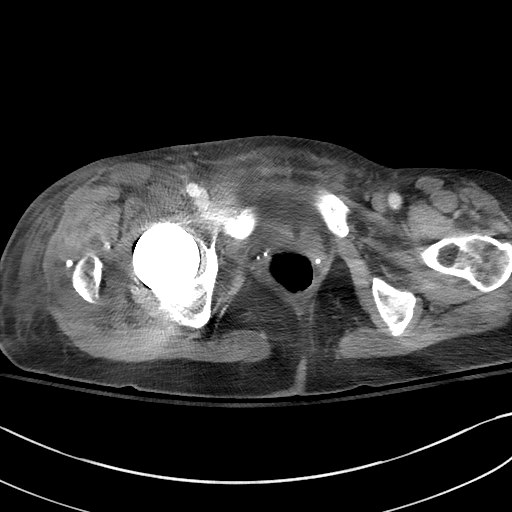
[im 16/82  soft-tissue]
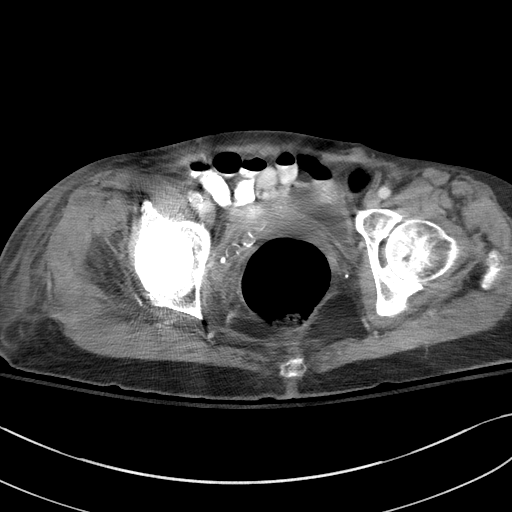
[im 26/82  soft-tissue]
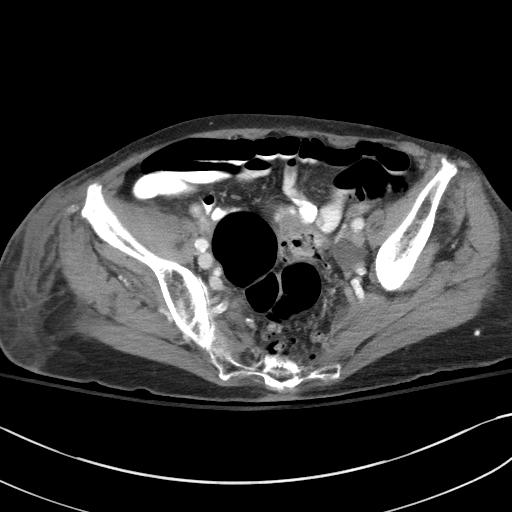
[im 31/82  soft-tissue]
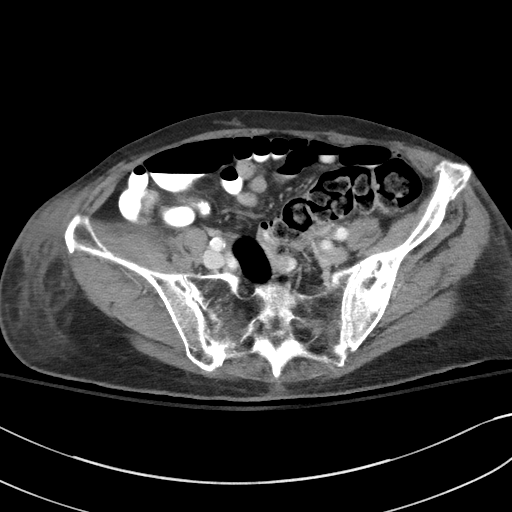
[im 36/82  soft-tissue]
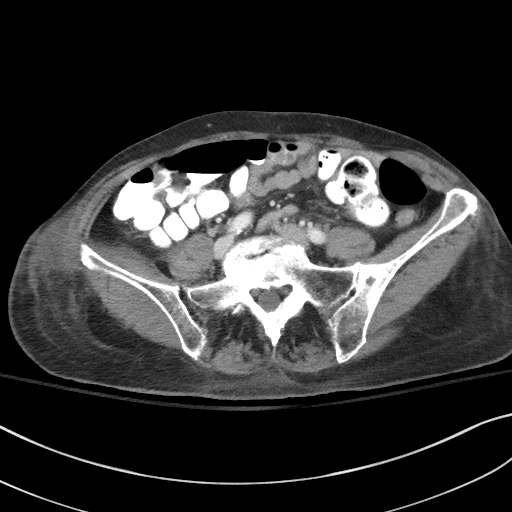
[im 41/82  soft-tissue]
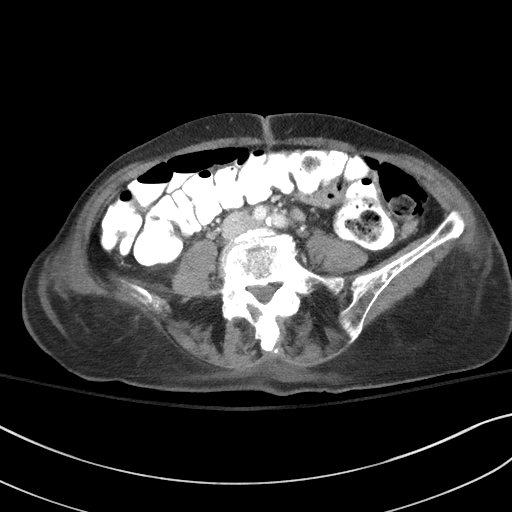
[im 46/82  soft-tissue]
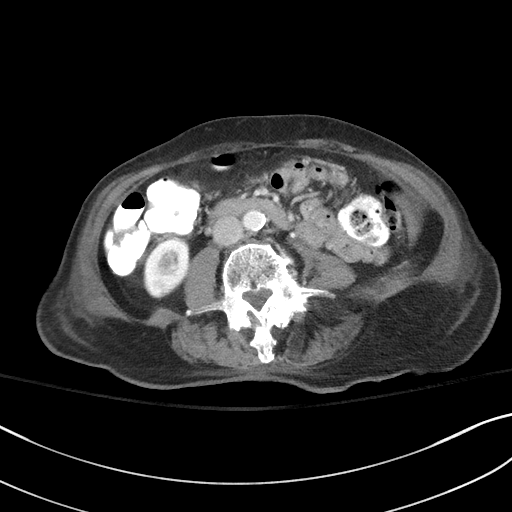
[im 51/82  soft-tissue]
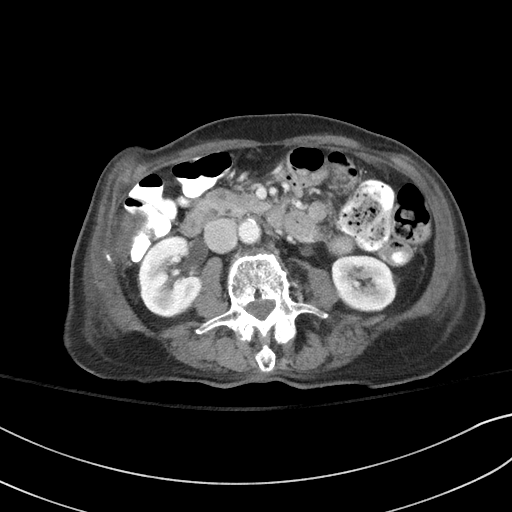
[im 51/82  bone]
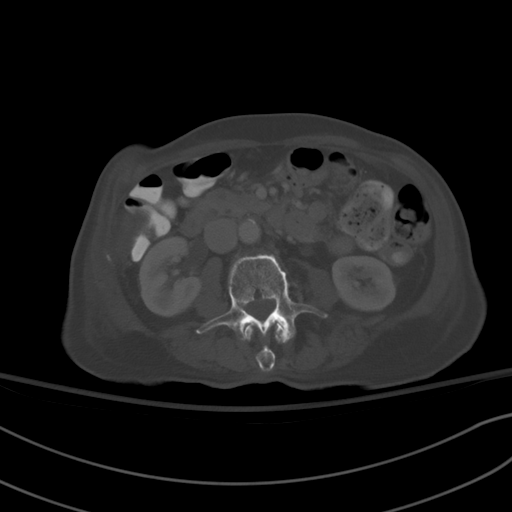
[im 56/82  soft-tissue]
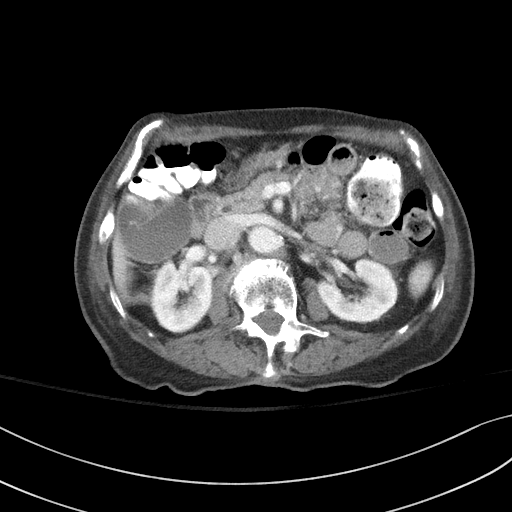
[im 66/82  soft-tissue]
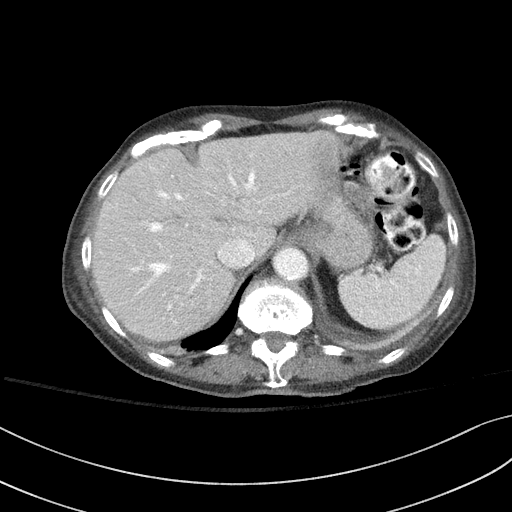
[im 71/82  soft-tissue]
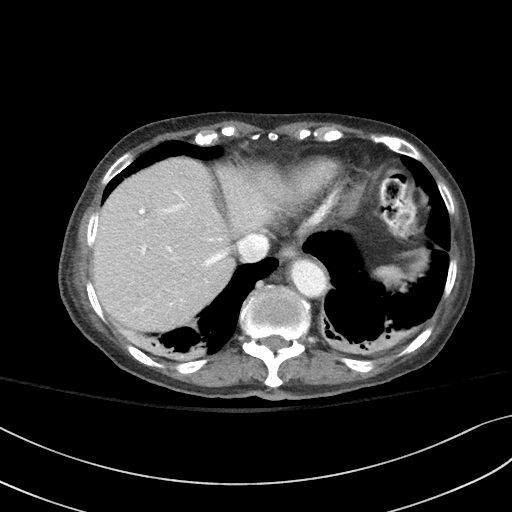
[im 76/82  soft-tissue]
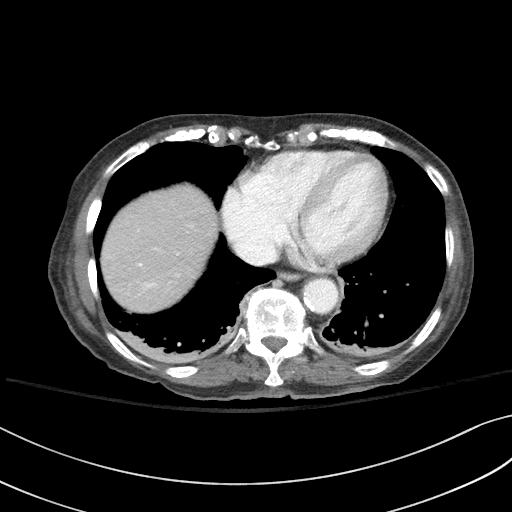

[Series 5: coronal (person_name) · coronal · 0.78mm/px · 3 of 72 slices shown]
[im 24/72  soft-tissue]
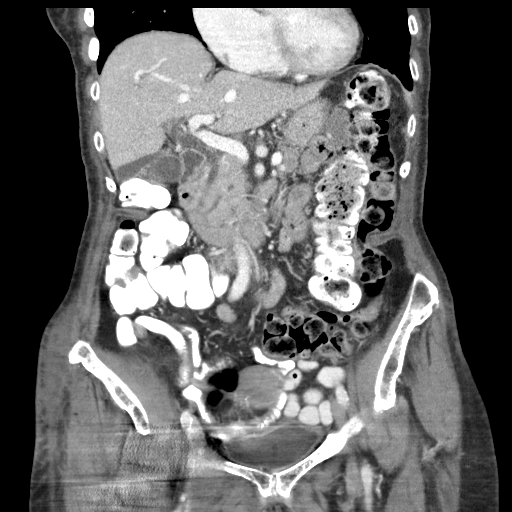
[im 32/72  soft-tissue]
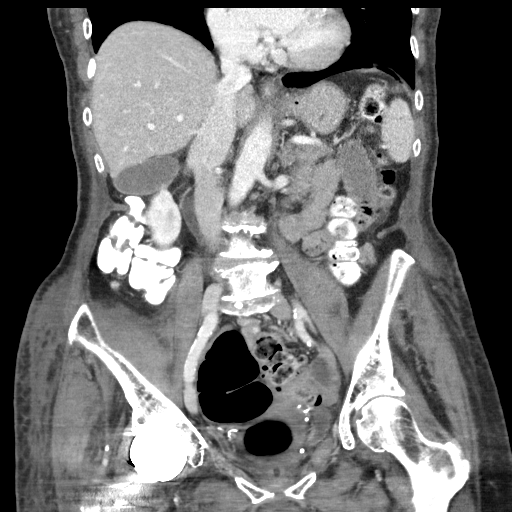
[im 40/72  soft-tissue]
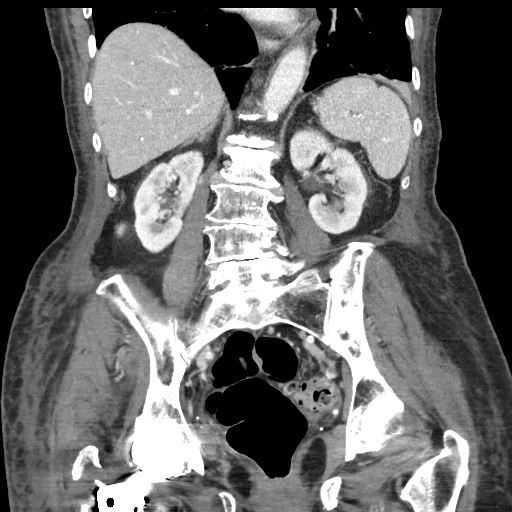

[16 of 46 positions shown; findings below may reference images not displayed]

FINDINGS: Lower chest: Patchy bilateral lower lobe opacities, likely
atelectasis. Trace bilateral pleural effusions.

Hepatobiliary: Subcentimeter posterior right hepatic cysts.

Gallbladder is unremarkable. No intrahepatic or extrahepatic ductal
dilatation.

Pancreas: Within normal limits.

Spleen: Within normal limits.

Adrenals/Urinary Tract: Adrenal glands are within normal limits.

Subcentimeter left renal cysts. Right kidney is within normal
limits. No hydronephrosis.

Bladder is decompressed by an indwelling Foley catheter.

Stomach/Bowel: Stomach is within normal limits.

No evidence of bowel obstruction.

Appendix is not discretely visualized.

Extensive sigmoid diverticulosis, without evidence of
diverticulitis.

Vascular/Lymphatic: No evidence of abdominal aortic aneurysm.

Atherosclerotic calcifications of the abdominal aorta and branch
vessels.

Sent lymph nodes

Reproductive: Uterus is within normal limits.

Left ovary is within normal limits.  No right adnexal mass.

Other: Trace pelvic ascites, simple.  No free air.

Musculoskeletal: Degenerative changes of the visualized
thoracolumbar spine. Mild lumbar dextroscoliosis.

Right hip hemiarthroplasty. Periprosthetic fracture with associated
intramuscular fluid/hemorrhage will be described on dedicated right
hip CT.

Mild body wall edema along the right lateral flank.

Old/healed right lateral 6th through 8th rib fractures.
IMPRESSION: Right hip hemiarthroplasty. Periprosthetic fracture with associated
intramuscular fluid/hemorrhage will be described on dedicated right
hip CT.

Otherwise, no evidence of traumatic injury to the abdomen/pelvis.

Trace bilateral pleural effusions. Patchy bilateral lower lobe
opacities, likely atelectasis.

## 2021-12-03 IMAGING — CT CT HIP*R* W/CM
2 of 5 series · 17 of 46 positions shown, 19 images · IV contrast (OMNIPAQUE)
Comparison: Right hip x-ray 11/14/2020

CLINICAL DATA: Periprosthetic right hip fracture

EXAM:
CT OF THE LOWER RIGHT EXTREMITY WITH CONTRAST
TECHNIQUE: Multidetector CT imaging of the lower right extremity was performed
according to the standard protocol following intravenous contrast
administration.
CONTRAST:  100mL OMNIPAQUE IOHEXOL 300 MG/ML  SOLN

[Series 1: axial (person_name) · axial · 0.44mm/px · z∈[+830,+1121]mm · 14 of 113 slices shown, 16 images]
[im 8/113  soft-tissue]
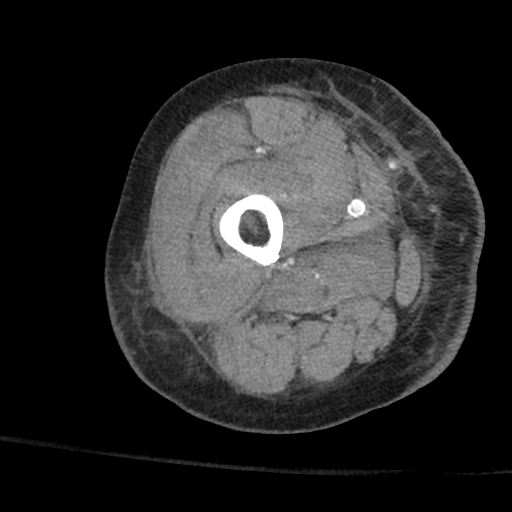
[im 8/113  bone]
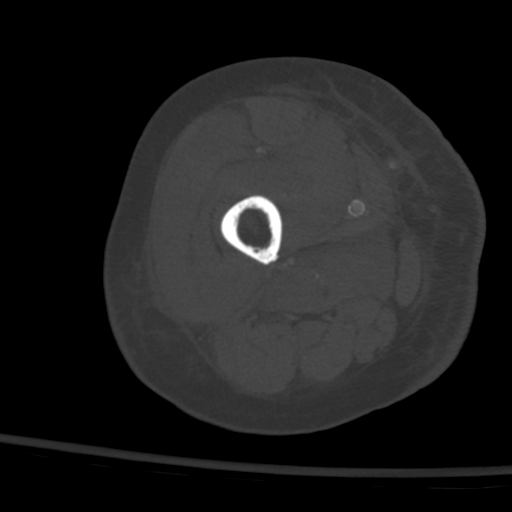
[im 15/113  soft-tissue]
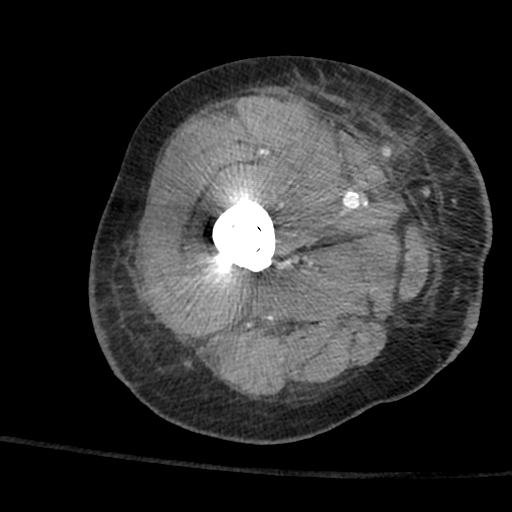
[im 23/113  soft-tissue]
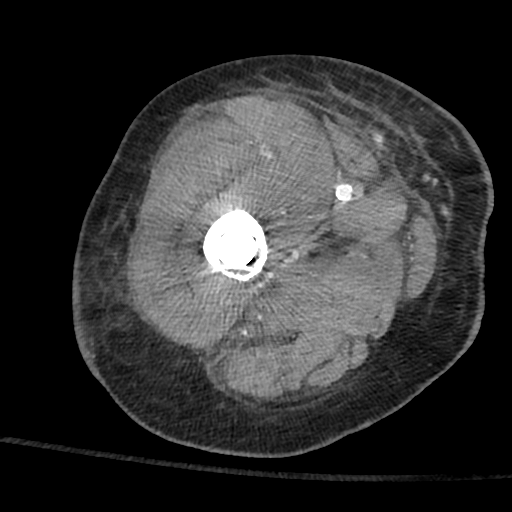
[im 30/113  soft-tissue]
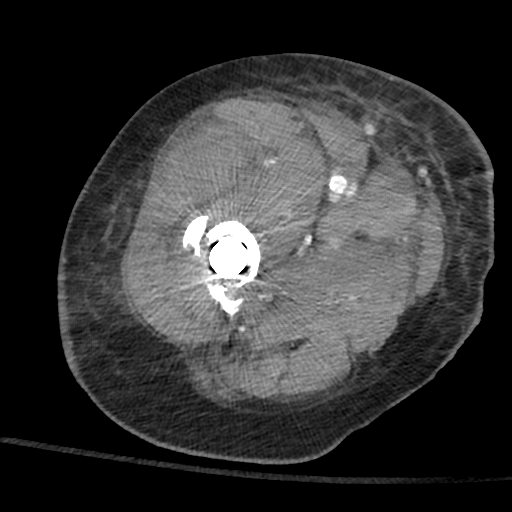
[im 38/113  soft-tissue]
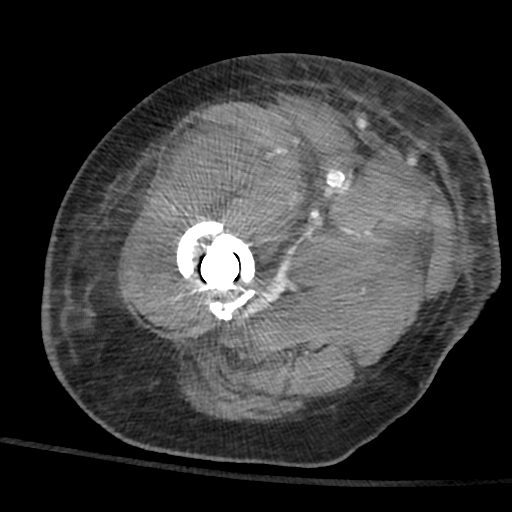
[im 45/113  soft-tissue]
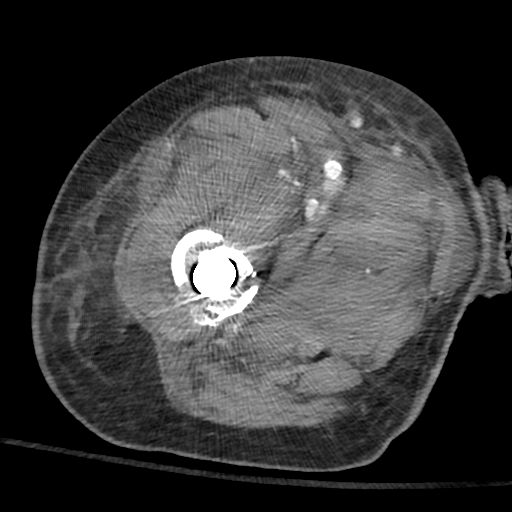
[im 53/113  soft-tissue]
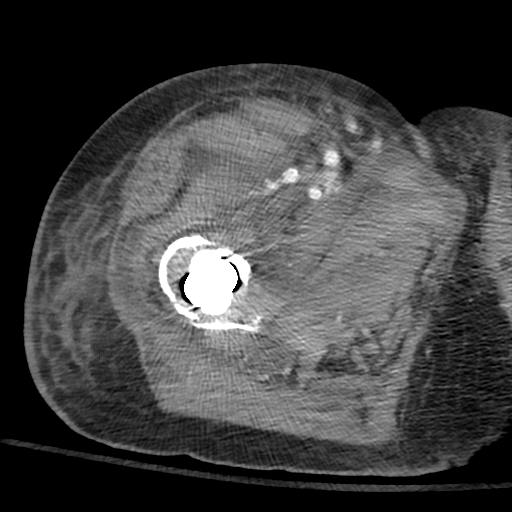
[im 60/113  soft-tissue]
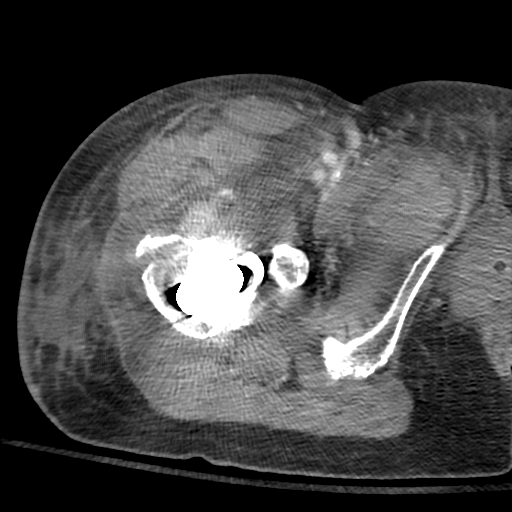
[im 68/113  soft-tissue]
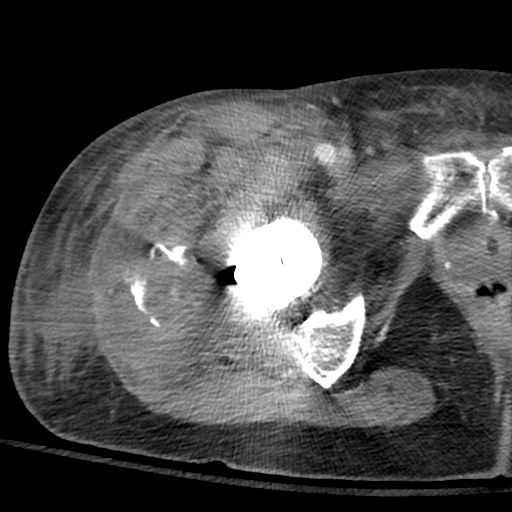
[im 68/113  bone]
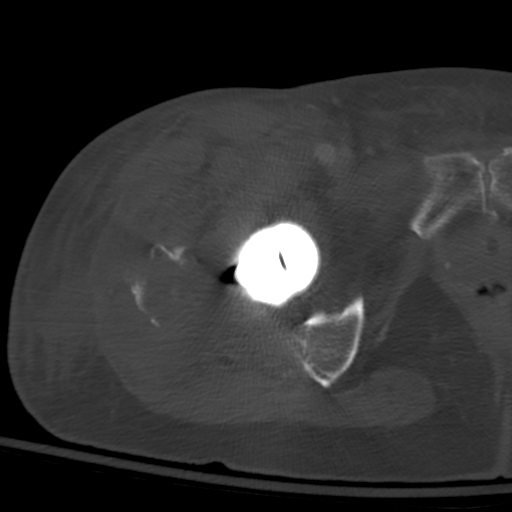
[im 75/113  soft-tissue]
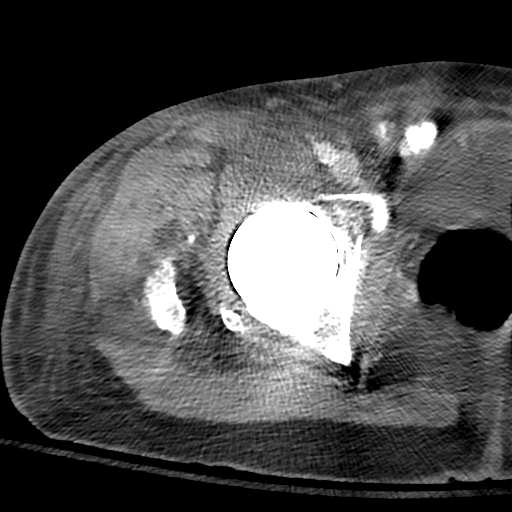
[im 83/113  soft-tissue]
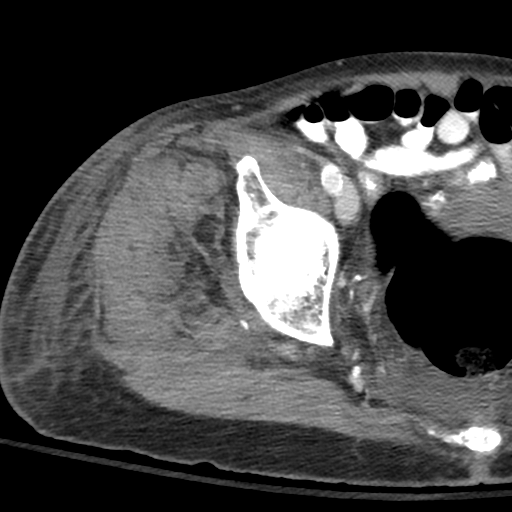
[im 90/113  soft-tissue]
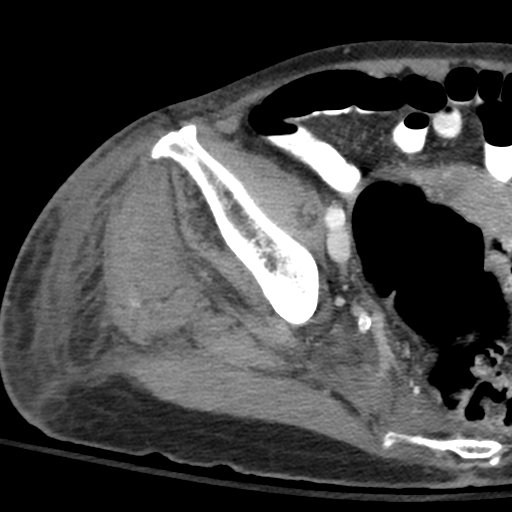
[im 98/113  soft-tissue]
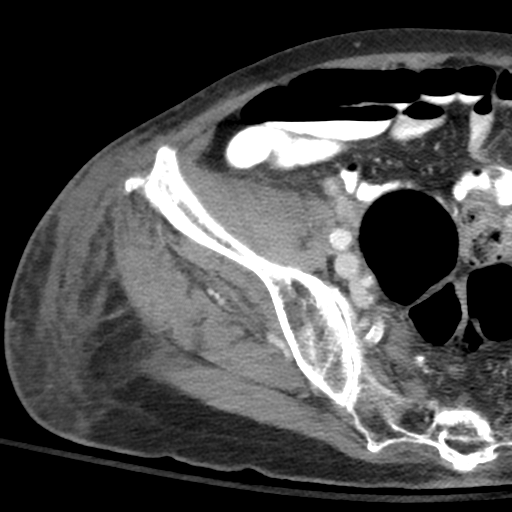
[im 105/113  soft-tissue]
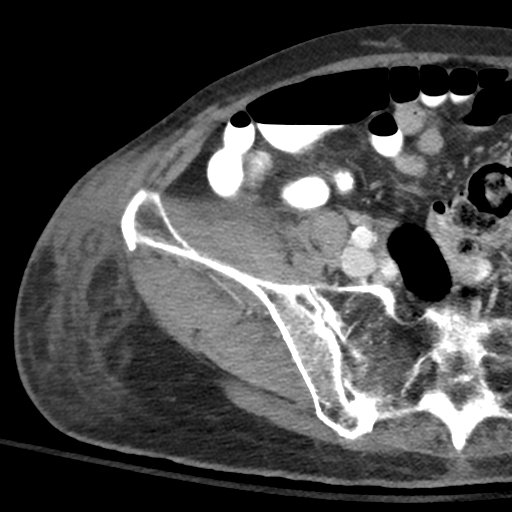

[Series 503: st coronal · coronal · 0.66mm/px · 3 of 110 slices shown]
[im 28/110  soft-tissue]
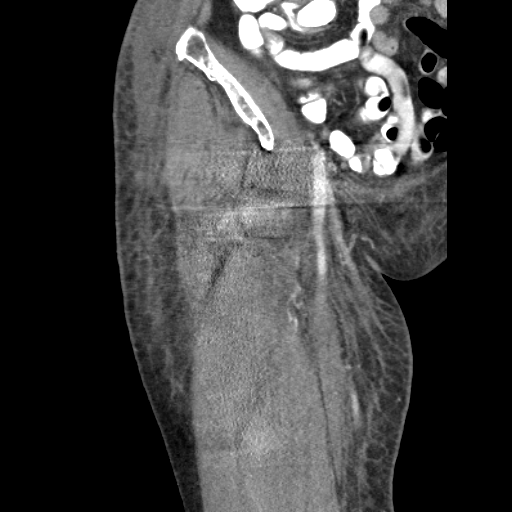
[im 55/110  soft-tissue]
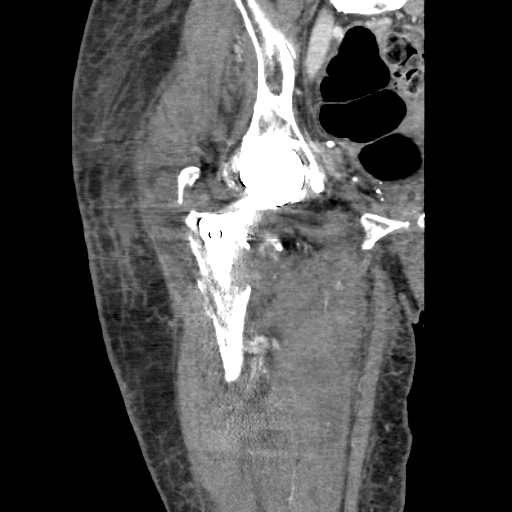
[im 82/110  soft-tissue]
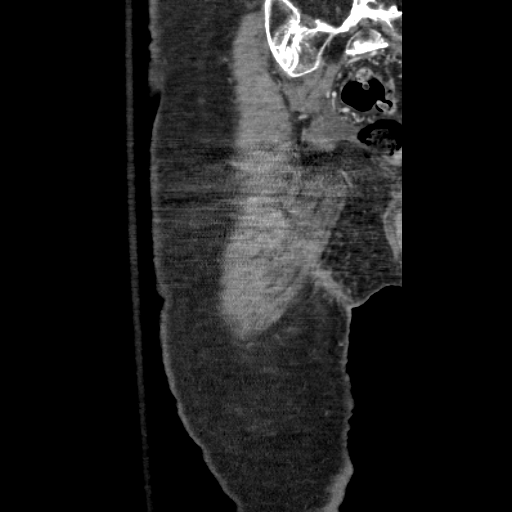

[17 of 46 positions shown; findings below may reference images not displayed]

FINDINGS: Bones/Joint/Cartilage

Right hip hemiarthroplasty. Acute comminuted periprosthetic fracture
of the proximal right femur. Comminuted fracture through the base of
the greater trochanter with approximately 1.5 cm of proximal
displacement (series 505, image 48). Lesser trochanter is superior
medially displaced by up to 2.0 cm. Comminuted mildly displaced
fracture of the subtrochanteric portion of the right femur extends
to the proximal femoral diaphysis with fracture margin extending to
5.0 cm proximal to the tip of the femoral prosthesis. No acetabular
fracture identified. There is some heterotopic bone formation along
the posterolateral margin of the acetabulum. No fracture of the
visualized right hemipelvis.

Ligaments

Suboptimally assessed by CT.

Muscles and Tendons

Bony avulsions of the greater and lesser trochanters without
evidence of tendon injury by CT. No well-defined intramuscular
collection.

Soft tissues

Large amount of ill-defined hemorrhage within the soft tissues
overlying the lateral aspect of the right hip and proximal thigh
which extends at least 20 cm in craniocaudal dimension.
IMPRESSION: 1. Acute comminuted periprosthetic fracture of the proximal right
femur as described above.
2. Large amount of ill-defined hemorrhage within the soft tissues
overlying the lateral aspect of the right hip and proximal thigh
which extends at least 20 cm in craniocaudal dimension.

## 2021-12-08 ENCOUNTER — Telehealth: Payer: Self-pay | Admitting: Pharmacist

## 2021-12-08 NOTE — Chronic Care Management (AMB) (Signed)
Chronic Care Management Pharmacy Assistant   Name: SHARRON PETRUSKA  MRN: [id number] DOB: 05-30-1932  [name] is an 88 y.o. year old female who presents for her initial CCM visit with the clinical pharmacist.  Reason for Encounter: Chart prep for initial visit with [name] the clinical pharmacist on 12/15/2021   Conditions to be addressed/monitored: HTN, HLD, and Osteoporosis, Diverticulitis, IBS, DJD  Recent office visits:  10/28/2021 [name] LPN - medicare annual wellness exam  06/03/2021 [name] MD - Patient was seen for Asymptomatic varicose veins of right lower extremity Fatigue, unspecified type and additional issues. No medication changes. No follow up noted.   Recent consult visits:  None  Hospital visits:  None  Medications: Outpatient Encounter Medications as of 12/08/2021  Medication Sig Note   b complex vitamins tablet Take 1 tablet by mouth daily.    bisoprolol-hydrochlorothiazide (ZIAC) 5-6.25 MG tablet TAKE 1 TABLET BY MOUTH DAILY. NEEDS APPT FOR FURTHER REFILLS    Calcium-Vitamin D-Vitamin K (VIACTIV CALCIUM PLUS D) 650-12.5-40 MG-MCG-MCG CHEW As directed    feeding supplement (BOOST / RESOURCE BREEZE) LIQD Take 1 Container by mouth 2 (two) times daily. 11/28/2020: 0900/2100   gabapentin (NEURONTIN) 100 MG capsule Take 100 mg by mouth at bedtime.    Glucosamine-Chondroitin 250-200 MG TABS Take 1 tablet by mouth daily.    Multiple Minerals-Vitamins (CITRACAL PLUS) TABS Take 1 tablet by mouth daily.    Multiple Vitamin (MULTIVITAMIN WITH MINERALS) TABS tablet Take 1 tablet by mouth daily.    Omega-3 Fatty Acids (FISH OIL) 1000 MG CAPS Take 2 capsules (2,000 mg total) by mouth 2 (two) times daily. 11/28/2020: 0900/2100   PRESCRIPTION MEDICATION Take 240 mLs by mouth 2 (two) times daily. MEDPASS 11/28/2020: 0900/2100   Vitamin D, Ergocalciferol, (DRISDOL) 1.25 MG (50000 UNIT) CAPS capsule TAKE 1 CAPSULE (50,000 UNITS TOTAL) BY  MOUTH EVERY 7 (SEVEN) DAYS    No facility-administered encounter medications on file as of 12/08/2021.  Fill History: BISOPROLOL-HCTZ 5-6.25 MG TAB 10/05/2021 90   VITAMIN D2 1.25MG (50,000 UNIT) 07/06/2021 83   GABAPENTIN 100 MG CAPSULE 10/08/2021 30   Have you seen any other providers since your last visit? **Patient denies any recent visits with other providers.   Any changes in your medications or health? Patient denies any changes in her medications or health.  Any side effects from any medications? Patient denies any side effects from medications.   Do you have an symptoms or problems not managed by your medications? Patient denies any issues.   Any concerns about your health right now? Patient denies any concerns at this time.   Has your provider asked that you check blood pressure, blood sugar, or follow special diet at home? Patient is not checking blood pressure, blood sugars or following any specific diet.   Do you get any type of exercise on a regular basis? Patient states she is stretching daily and doing some exercises like leg lifts and walking daily.   Can you think of a goal you would like to reach for your health? Patient denies any goals at this time.   Do you have any problems getting your medications? Patient denies any issues getting medications.   Is there anything that you would like to discuss during the appointment? Patient denies anything to discuss at her appointment.   Please bring medications and supplements to appointment  Care Gaps: AWV - completed 10/28/2021 Last BP - 132/80 on 06/03/2022 Covid booster - overdue  Star Rating Drugs: None  [facility]  Clinical Pharmacist Assistant [phone]

## 2021-12-11 ENCOUNTER — Telehealth: Payer: Self-pay | Admitting: Pharmacist

## 2021-12-11 NOTE — Chronic Care Management (AMB) (Signed)
° ° °  Chronic Care Management Pharmacy Assistant   Name: [name]  MRN: [id number] DOB: 1932-07-25  12/15/2021 APPOINTMENT REMINDER   [name] was reminded to have all medications, supplements and any blood glucose and blood pressure readings available for review with [name], Pharm. D, at her telephone visit on 12/15/2021 at 11:00.   Questions: Have you had any recent office visit or specialist visit outside of [facility]? Patient denies any visits outside of [facility]  Are there any concerns you would like to discuss during your office visit?  Patient states she is due for her Prolia injection and is wanting to know when it is due.   Are you having any problems obtaining your medications? (Whether it pharmacy issues or cost)  Patient denies any issues getting medication.   If patient has any PAP medications ask if they are having any problems getting their PAP medication or refill? Patient denies any medications being filled by PAP  Care Gaps: AWV - scheduled 10/29/2022 Last BP - 132/80 on 06/03/2022 Covid booster - overdue  Star Rating Drug: None  Any gaps in medications fill history?  [facility] Pharmacist Assistant [phone]

## 2021-12-15 ENCOUNTER — Ambulatory Visit (INDEPENDENT_AMBULATORY_CARE_PROVIDER_SITE_OTHER): Payer: Medicare Other | Admitting: Pharmacist

## 2021-12-15 DIAGNOSIS — I10 Essential (primary) hypertension: Secondary | ICD-10-CM

## 2021-12-15 DIAGNOSIS — M818 Other osteoporosis without current pathological fracture: Secondary | ICD-10-CM

## 2021-12-15 NOTE — Progress Notes (Addendum)
Chronic Care Management Pharmacy Note  12/15/2021 Name:  [name] MRN:  [id number] DOB:  23-Feb-1932  Summary: Pt does not check BP at home Pt inquired about next Prolia injection  Recommendations/Changes made from today's visit: -Recommended moving bisoprolol-HCTZ to morning to avoid nocturia -Recommended monitoring BP at home and brining BP cuff to office to ensure accuracy -Recommended taking 2 of the Viactiv chews daily and separating from multivitamin -Recommended repeat vitamin D level  Plan: Scheduled for next Prolia injection and PCP visit BP assessment in 3 months  Subjective: [name] is an 88 y.o. year old female who is a primary patient of [name], Tammy Sierras, MD.  The CCM team was consulted for assistance with disease management and care coordination needs.    Engaged with patient by telephone for initial visit in response to provider referral for pharmacy case management and/or care coordination services.   Consent to Services:  The patient was given the following information about Chronic Care Management services today, agreed to services, and gave verbal consent: 1. CCM service includes personalized support from designated clinical staff supervised by the primary care provider, including individualized plan of care and coordination with other care providers 2. 24/7 contact phone numbers for assistance for urgent and routine care needs. 3. Service will only be billed when office clinical staff spend 20 minutes or more in a month to coordinate care. 4. Only one practitioner may furnish and bill the service in a calendar month. 5.The patient may stop CCM services at any time (effective at the end of the month) by phone call to the office staff. 6. The patient will be responsible for cost sharing (co-pay) of up to 20% of the service fee (after annual deductible is met). Patient agreed to services and consent obtained.  Patient Care Team: Tammy Post, MD as PCP - General (Family Medicine) [name], [facility] as Pharmacist (Pharmacist)  Recent office visits: 10/28/2021 Tammy Arbour LPN - medicare annual wellness exam.    06/03/2021 Tammy Littler MD - Patient was seen for Asymptomatic varicose veins of right lower extremity Fatigue, unspecified type and additional issues. No medication changes. No follow up noted.   Recent consult visits: 10/08/21 [name] (ophthalmology): Patient presented for cataract visit. Unable to access notes.  Hospital visits: None in previous 6 months   Objective:  Lab Results  Component Value Date   CREATININE 0.56 11/28/2020   BUN 26 (H) 11/28/2020   GFR 71.15 09/17/2019   GFRNONAA >60 11/28/2020   GFRAA 105 09/04/2007   NA 138 11/28/2020   K 3.7 11/28/2020   CALCIUM 8.5 (L) 11/28/2020   CO2 21 (L) 11/28/2020   GLUCOSE 99 11/28/2020    Lab Results  Component Value Date/Time   GFR 71.15 09/17/2019 08:52 AM   GFR 70.18 03/14/2019 10:00 AM    Last diabetic Eye exam: No results found for: HMDIABEYEEXA  Last diabetic Foot exam: No results found for: HMDIABFOOTEX   Lab Results  Component Value Date   CHOL 205 (H) 05/21/2013   HDL 58.40 05/21/2013   LDLCALC 110 (H) 04/14/2012   LDLDIRECT 125.1 05/21/2013   TRIG 95.0 05/21/2013   CHOLHDL 4 05/21/2013    Hepatic Function Latest Ref Rng & Units 02/16/2021 11/15/2020 08/30/2017  Total Protein 6.1 - 8.1 g/dL 6.9 5.2(L) 6.7  Albumin 3.5 - 5.0 g/dL - 3.0(L) 4.1  AST 15 - 41 U/L - 21 21  ALT 0 - 44 U/L - 11 11  Alk Phosphatase 38 - 126 U/L - 35(L) 47  Total Bilirubin 0.3 - 1.2 mg/dL - 0.8 0.8  Bilirubin, Direct 0.0 - 0.2 mg/dL - 0.1 -    Lab Results  Component Value Date/Time   TSH 2.88 02/16/2021 08:34 AM   TSH 1.71 05/21/2013 09:57 AM    CBC Latest Ref Rng & Units 02/16/2021 11/28/2020 11/20/2020  WBC 4.0 - 10.5 K/uL 7.5 12.1(H) 10.2  Hemoglobin 12.0 - 15.0 g/dL 12.0 9.4(L) 9.1(L)  Hematocrit 36.0 - 46.0 % 34.9(L)  28.7(L) 27.1(L)  Platelets 150.0 - 400.0 K/uL 172.0 399 186    Lab Results  Component Value Date/Time   VD25OH 88.31 09/17/2019 08:52 AM   VD25OH 86.54 03/14/2018 02:53 PM    Clinical ASCVD: No  The ASCVD Risk score (Arnett DK, et al., 2019) failed to calculate for the following reasons:   The 2019 ASCVD risk score is only valid for ages 60 to 39    Depression screen PHQ 2/9 10/28/2021 10/27/2020 05/23/2018  Decreased Interest 0 0 0  Down, Depressed, Hopeless 0 0 0  PHQ - 2 Score 0 0 0  Altered sleeping - 0 -  Tired, decreased energy - 0 -  Change in appetite - 0 -  Feeling bad or failure about yourself  - 0 -  Trouble concentrating - 0 -  Moving slowly or fidgety/restless - 0 -  Suicidal thoughts - 0 -  PHQ-9 Score - 0 -  Difficult doing work/chores - Not difficult at all -      Social History   Tobacco Use  Smoking Status Never  Smokeless Tobacco Never   BP Readings from Last 3 Encounters:  06/03/21 132/80  02/16/21 128/70  12/16/20 118/72   Pulse Readings from Last 3 Encounters:  06/03/21 95  02/16/21 79  12/16/20 75   Wt Readings from Last 3 Encounters:  10/28/21 119 lb (54 kg)  06/03/21 119 lb 11.2 oz (54.3 kg)  02/16/21 118 lb 14.4 oz (53.9 kg)   BMI Readings from Last 3 Encounters:  10/28/21 22.48 kg/m  06/03/21 19.32 kg/m  02/16/21 19.19 kg/m    Assessment/Interventions: Review of patient past medical history, allergies, medications, health status, including review of consultants reports, laboratory and other test data, was performed as part of comprehensive evaluation and provision of chronic care management services.   SDOH:  (Social Determinants of Health) assessments and interventions performed: Yes SDOH Interventions    Flowsheet Row Most Recent Value  SDOH Interventions   Financial Strain Interventions Intervention Not Indicated  Transportation Interventions Intervention Not Indicated      SDOH Screenings   Alcohol Screen: Not on file   Depression (PHQ2-9): Low Risk    PHQ-2 Score: 0  Financial Resource Strain: Low Risk    Difficulty of Paying Living Expenses: Not hard at all  Food Insecurity: No Food Insecurity   Worried About Charity fundraiser in the Last Year: Never true   Arboriculturist in the Last Year: Never true  Housing: Low Risk    Last Housing Risk Score: 0  Physical Activity: Sufficiently Active   Days of Exercise per Week: 7 days   Minutes of Exercise per Session: 30 min  Social Connections: Engineer, building services of Communication with Friends and Family: More than three times a week   Frequency of Social Gatherings with Friends and Family: More than three times a week   Attends Religious Services: More than 4 times per year  Active Member of Clubs or Organizations: Yes   Attends Music therapist: More than 4 times per year   Marital Status: Married  Stress: No Stress Concern Present   Feeling of Stress : Not at all  Tobacco Use: Low Risk    Smoking Tobacco Use: Never   Smokeless Tobacco Use: Never   Passive Exposure: Not on file  Transportation Needs: No Transportation Needs   Lack of Transportation (Medical): No   Lack of Transportation (Non-Medical): No   Patient is going down to [facility] to crochet every Tuesday for an hour. She also enjoys baking cakes, going to Bible study on Wednesdays and cleaning or working in the yard or in the house.  Patient does get up quite a bit during the night to use the bathroom and wakes up around 12am and then goes back to sleep around 1am. Patient reports this has been going on for a while. She is usually good at falling asleep around 9pm. She watches TV prior to going to bed. She doesn't take naps but doesn't feel super tired during the day. She does get worn out easily and this has been going on since she broke her hip.  Patient has been walking with her daughter and goes shopping with her as well. Patient states she is stretching daily  and doing some leg exercises and tai chi and walking daily. She doesn't sit down much.  Her husband works and drives cars for 7 hours. He does this almost 5 days a week and has been doing this since he retired.   CCM Care Plan  Allergies  Allergen Reactions   Codeine     REACTION: headaches   Flagyl [Metronidazole Hcl]     nausea    Medications Reviewed Today     Reviewed by [name], [facility] (Pharmacist) on 12/15/21 at 1406  Med List Status: <None>   Medication Order Taking? Sig Documenting Provider Last Dose Status Informant  bisoprolol-hydrochlorothiazide (ZIAC) 5-6.25 MG tablet [id number] Yes TAKE 1 TABLET BY MOUTH DAILY. NEEDS APPT FOR FURTHER REFILLS [name], Tammy Sierras, MD Taking Active   Calcium-Vitamin D-Vitamin K (VIACTIV CALCIUM PLUS D) 650-12.5-40 MG-MCG-MCG CHEW [id number] Yes As directed  Patient taking differently: 2 tablets daily.   Tammy Post, MD Taking Active Nursing Home Medication Administration Guide (MAG)  feeding supplement (BOOST / RESOURCE BREEZE) LIQD [id number]  Take 1 Container by mouth 2 (two) times daily. [provider]  Active Nursing Home Medication Administration Guide (MAG)           Med Note Marcell Barlow   Fri Nov 28, 2020  8:28 AM) 0900/2100  Glucosamine-Chondroitin 250-200 MG TABS [id number] Yes Take 1 tablet by mouth daily. Tammy Post, MD Taking Active Nursing Home Medication Administration Guide (MAG)  Multiple Vitamin (MULTIVITAMIN WITH MINERALS) TABS tablet [id number] Yes Take 1 tablet by mouth daily. [provider] Taking Active Nursing Home Medication Administration Guide (MAG)  Omega-3 Fatty Acids (FISH OIL) 1000 MG CAPS [id number] Yes Take 2 capsules (2,000 mg total) by mouth 2 (two) times daily.  Patient taking differently: Take 1,000 mg by mouth daily.   Tammy Post, MD Taking Active Nursing Home Medication Administration Guide (MAG)           Med Note Alesia Banda, Woodfin Ganja   Fri Nov 28, 2020   8:29 AM) 0900/2100  Vitamin D, Ergocalciferol, (DRISDOL) 1.25 MG (50000 UNIT) CAPS capsule [id number] Yes TAKE 1 CAPSULE (50,000 UNITS TOTAL) BY MOUTH  EVERY 7 (SEVEN) DAYS [name], Tammy Sierras, MD Taking Active             Patient Active Problem List   Diagnosis Date Noted   Hip fracture ([place]) 11/14/2020   ACUTE FRONTAL SINUSITIS 09/28/2010   RECTAL BLEEDING 06/16/2010   ANEMIA 06/12/2010   COLONIC POLYPS, ADENOMATOUS, BENIGN 06/12/2010   URETHRAL CARUNCLE 12/02/2009   Hyperlipidemia 10/02/2008   HAIR LOSS 10/02/2008   INTERNAL HEMORRHOIDS WITH OTHER COMPLICATION 61/44/3154   CHEST PAIN, ATYPICAL 12/04/2007   Essential hypertension 05/30/2007   DIVERTICULOSIS, COLON 05/30/2007   IRRITABLE BOWEL SYNDROME 05/30/2007   DEGENERATIVE JOINT DISEASE, MILD 05/30/2007   Osteoporosis 05/30/2007    Immunization History  Administered Date(s) Administered   H1N1 10/02/2008   Influenza Split 09/30/2011, 10/10/2012   Influenza Whole 09/04/2007, 07/30/2008, 10/08/2009, 09/11/2010   Influenza, High Dose Seasonal PF 07/28/2017   Influenza,inj,Quad PF,6+ Mos 07/11/2013, 09/18/2018, 07/27/2019, 08/05/2020   Influenza-Unspecified 09/11/2014, 08/25/2016, 08/04/2020   PFIZER(Purple Top)SARS-COV-2 Vaccination 02/04/2020, 02/27/2020, 09/07/2020   Pneumococcal Conjugate-13 11/22/2014   Pneumococcal Polysaccharide-23 12/04/2007   Td 04/02/2008   Zoster, Live 11/26/2013    Conditions to be addressed/monitored:  Hypertension, Osteoporosis, and vitamin D deficiency  Care Plan : [place]  Updates made by [name], [place] since 12/15/2021 12:00 AM     Problem: Problem: Hypertension and Osteoporosis      Long-Range Goal: Patient-Specific Goal   Start Date: 12/15/2021  Expected End Date: 12/15/2022  This Visit's Progress: On track  Priority: High  Note:   Current Barriers:  Unable to independently monitor therapeutic efficacy  Pharmacist Clinical Goal(s):  Patient will  achieve adherence to monitoring guidelines and medication adherence to achieve therapeutic efficacy through collaboration with PharmD and provider.   Interventions: 1:1 collaboration with Tammy Post, MD regarding development and update of comprehensive plan of care as evidenced by provider attestation and co-signature Inter-disciplinary care team collaboration (see longitudinal plan of care) Comprehensive medication review performed; medication list updated in electronic medical record  Hypertension (BP goal <140/90) -Controlled -Current treatment: Bisoprolol-HCTZ 5-6.25 mg 1 tablet daily - in PM - Appropriate, Query effective, Safe, Accessible -Medications previously tried: unknown  -Current home readings: 138/75; doesn't check often; (wrist cuff) -Current dietary habits: doesn't add much salt to her cooking; uses sodium free salt sometimes; recommended reading package labels; not using canned vegetables; not much frozen meals -Current exercise habits: no formal exercise -Denies hypotensive/hypertensive symptoms -Educated on BP goals and benefits of medications for prevention of heart attack, stroke and kidney damage; Daily salt intake goal < 2300 mg; Importance of home blood pressure monitoring; Proper BP monitoring technique; -Counseled to monitor BP at home weekly, document, and provide log at future appointments -Counseled on diet and exercise extensively Recommended to continue current medication Recommended moving BP medication to the evening to avoid frequent nighttime urination.   Osteoporosis (Goal prevent fractures) -Controlled -Last DEXA Scan: 2019   T-Score femoral neck: -2.8  T-Score total hip: n/a  T-Score lumbar spine: n/a  T-Score forearm radius: -3.6  10-year probability of major osteoporotic fracture: n/a  10-year probability of hip fracture: n/a -Patient is a candidate for pharmacologic treatment due to T-Score < -2.5 in femoral neck -Current treatment   Prolia injections every 6 months (last 06/16/21) - Appropriate, Effective, Safe, Accessible Citracal 650 mg (2 chews) 1-2 chews per day - Appropriate, Effective, Safe, Accessible Multivitamin for hair daily (calcium 116 mg)  - Appropriate, Effective, Safe, Accessible -Medications previously tried: none  -  Recommend [phone] units of vitamin D daily. Recommend 1200 mg of calcium daily from dietary and supplemental sources. Recommend weight-bearing and muscle strengthening exercises for building and maintaining bone density. -Counseled on diet and exercise extensively Recommended to continue current medication Recommended taking 2 of the Viactiv chews daily and separating from multivitamin.   Vitamin D deficiency (Goal: vit D 30-100) -Controlled -Current treatment  Vitamin D 50000 units 1 capsule weekly - Appropriate, Effective, Safe, Accessible -Medications previously tried: none  -Counseled on benefits of taking with food. Recommended repeat vitamin D level.  Health Maintenance -Vaccine gaps: COVID booster, shingrix -Current therapy:  Glucosamine-chondroitin 250-500 mg 1 tablet daily Multivitamin for hair daily Fish oil 1000 mcg daily -Educated on Cost vs benefit of each product must be carefully weighed by individual consumer -Patient is satisfied with current therapy and denies issues -Recommended to continue current medication  Patient Goals/Self-Care Activities Patient will:  - take medications as prescribed as evidenced by patient report and record review check blood pressure weekly, document, and provide at future appointments  Follow Up Plan: Telephone follow up appointment with care management team member scheduled for: 6 months      Medication Assistance: None required.  Patient affirms current coverage meets needs.  Compliance/Adherence/Medication fill history: Care Gaps: COVID booster, shingrix, tetanus Last BP - 132/80 on 06/03/2022  Star-Rating  Drugs: None  Patient's preferred pharmacy is:  [id number] [place], [place] - [address] [place] [address]Phone: [phone]: [phone] Uses pill box? Yes - week at a time Pt endorses 99% compliance (misses vitamin D sometimes)  We discussed: Current pharmacy is preferred with insurance plan and patient is satisfied with pharmacy services Patient decided to: Continue current medication management strategy  Care Plan and Follow Up Patient Decision:  Patient agrees to Care Plan and Follow-up.  Plan: Telephone follow up appointment with care management team member scheduled for:  6 months  MJeni Salles PharmD, [phone]

## 2021-12-15 NOTE — Patient Instructions (Signed)
Hi [name],  It was great to get to meet you over the telephone! Below is a summary of some of the topics we discussed.   Don't forget to start checking your blood pressure once a week to make sure it is staying in a good place and bring your cuff to your office visit with Dr. Elease Hashimoto to make sure it is accurate.  Also, don't forget to take 2 of the viactiv (calcium) chews every day to make sure you are getting the recommended amount of calcium.   Please reach out to me if you have any questions or need anything before our follow up!  Best, Maddie  [name], PharmD, [place] [facility] [place]   Visit Information   Goals Addressed             This Visit's Progress    Track and Manage My Blood Pressure-Hypertension       Timeframe:  Long-Range Goal Priority:  High Start Date:                             Expected End Date:                       Follow Up Date 03/14/22    - check blood pressure weekly - choose a place to take my blood pressure (home, clinic or office, retail store) - write blood pressure results in a log or diary    Why is this important?   You won't feel high blood pressure, but it can still hurt your blood vessels.  High blood pressure can cause heart or kidney problems. It can also cause a stroke.  Making lifestyle changes like losing a little weight or eating less salt will help.  Checking your blood pressure at home and at different times of the day can help to control blood pressure.  If the doctor prescribes medicine remember to take it the way the doctor ordered.  Call the office if you cannot afford the medicine or if there are questions about it.     Notes:        Patient Care Plan: CCM Pharmacy Care Plan     Problem Identified: Problem: Hypertension and Osteoporosis      Long-Range Goal: Patient-Specific Goal   Start Date: 12/15/2021  Expected End Date: 12/15/2022  This Visit's  Progress: On track  Priority: High  Note:   Current Barriers:  Unable to independently monitor therapeutic efficacy  Pharmacist Clinical Goal(s):  Patient will achieve adherence to monitoring guidelines and medication adherence to achieve therapeutic efficacy through collaboration with PharmD and provider.   Interventions: 1:1 collaboration with Eulas Post, MD regarding development and update of comprehensive plan of care as evidenced by provider attestation and co-signature Inter-disciplinary care team collaboration (see longitudinal plan of care) Comprehensive medication review performed; medication list updated in electronic medical record  Hypertension (BP goal <140/90) -Controlled -Current treatment: Bisoprolol-HCTZ 5-6.25 mg 1 tablet daily - in PM - Appropriate, Query effective, Safe, Accessible -Medications previously tried: unknown  -Current home readings: 138/75; doesn't check often; (wrist cuff) -Current dietary habits: doesn't add much salt to her cooking; uses sodium free salt sometimes; recommended reading package labels; not using canned vegetables; not much frozen meals -Current exercise habits: no formal exercise -Denies hypotensive/hypertensive symptoms -Educated on BP goals and benefits of medications for prevention of heart attack, stroke and kidney damage; Daily  salt intake goal < 2300 mg; Importance of home blood pressure monitoring; Proper BP monitoring technique; -Counseled to monitor BP at home weekly, document, and provide log at future appointments -Counseled on diet and exercise extensively Recommended to continue current medication Recommended moving BP medication to the evening to avoid frequent nighttime urination.   Osteoporosis (Goal prevent fractures) -Controlled -Last DEXA Scan: 2019   T-Score femoral neck: -2.8  T-Score total hip: n/a  T-Score lumbar spine: n/a  T-Score forearm radius: -3.6  10-year probability of major osteoporotic  fracture: n/a  10-year probability of hip fracture: n/a -Patient is a candidate for pharmacologic treatment due to T-Score < -2.5 in femoral neck -Current treatment  Prolia injections every 6 months (last 06/16/21) - Appropriate, Effective, Safe, Accessible Citracal 650 mg (2 chews) 1-2 chews per day - Appropriate, Effective, Safe, Accessible Multivitamin for hair daily (calcium 116 mg)  - Appropriate, Effective, Safe, Accessible -Medications previously tried: none  -Recommend [phone] units of vitamin D daily. Recommend 1200 mg of calcium daily from dietary and supplemental sources. Recommend weight-bearing and muscle strengthening exercises for building and maintaining bone density. -Counseled on diet and exercise extensively Recommended to continue current medication Recommended taking 2 of the Viactiv chews daily and separating from multivitamin.   Vitamin D deficiency (Goal: vit D 30-100) -Controlled -Current treatment  Vitamin D 50000 units 1 capsule weekly - Appropriate, Effective, Safe, Accessible -Medications previously tried: none  -Counseled on benefits of taking with food. Recommended repeat vitamin D level.  Health Maintenance -Vaccine gaps: COVID booster, shingrix -Current therapy:  Glucosamine-chondroitin 250-500 mg 1 tablet daily Multivitamin for hair daily Fish oil 1000 mcg daily -Educated on Cost vs benefit of each product must be carefully weighed by individual consumer -Patient is satisfied with current therapy and denies issues -Recommended to continue current medication  Patient Goals/Self-Care Activities Patient will:  - take medications as prescribed as evidenced by patient report and record review check blood pressure weekly, document, and provide at future appointments  Follow Up Plan: Telephone follow up appointment with care management team member scheduled for: 6 months      [name] was given information about Chronic Care Management  services today including:  CCM service includes personalized support from designated clinical staff supervised by her physician, including individualized plan of care and coordination with other care providers 24/7 contact phone numbers for assistance for urgent and routine care needs. Standard insurance, coinsurance, copays and deductibles apply for chronic care management only during months in which we provide at least 20 minutes of these services. Most insurances cover these services at 100%, however patients may be responsible for any copay, coinsurance and/or deductible if applicable. This service may help you avoid the need for more expensive face-to-face services. Only one practitioner may furnish and bill the service in a calendar month. The patient may stop CCM services at any time (effective at the end of the month) by phone call to the office staff.  Patient agreed to services and verbal consent obtained.   The patient verbalized understanding of instructions, educational materials, and care plan provided today and agreed to receive a mailed copy of patient instructions, educational materials, and care plan.  Telephone follow up appointment with pharmacy team member scheduled for: 6 months  Viona Gilmore, [facility]

## 2021-12-21 ENCOUNTER — Ambulatory Visit (INDEPENDENT_AMBULATORY_CARE_PROVIDER_SITE_OTHER): Payer: Medicare Other | Admitting: Family Medicine

## 2021-12-21 VITALS — BP 160/78 | HR 69 | Temp 97.8°F | Wt 118.8 lb

## 2021-12-21 DIAGNOSIS — R5383 Other fatigue: Secondary | ICD-10-CM

## 2021-12-21 DIAGNOSIS — M818 Other osteoporosis without current pathological fracture: Secondary | ICD-10-CM

## 2021-12-21 DIAGNOSIS — I10 Essential (primary) hypertension: Secondary | ICD-10-CM | POA: Diagnosis not present

## 2021-12-21 DIAGNOSIS — E559 Vitamin D deficiency, unspecified: Secondary | ICD-10-CM | POA: Diagnosis not present

## 2021-12-21 LAB — BASIC METABOLIC PANEL
BUN: 22 mg/dL (ref 6–23)
CO2: 31 mEq/L (ref 19–32)
Calcium: 10.4 mg/dL (ref 8.4–10.5)
Chloride: 97 mEq/L (ref 96–112)
Creatinine, Ser: 0.8 mg/dL (ref 0.40–1.20)
GFR: 66.06 mL/min (ref >60.00)
Glucose, Bld: 93 mg/dL (ref 70–99)
Potassium: 4.1 mEq/L (ref 3.5–5.1)
Sodium: 135 mEq/L (ref 135–145)

## 2021-12-21 LAB — CBC WITH DIFFERENTIAL/PLATELET
Basophils Absolute: 0.1 10*3/uL (ref 0.0–0.1)
Basophils Relative: 0.7 % (ref 0.0–3.0)
Eosinophils Absolute: 0.1 10*3/uL (ref 0.0–0.7)
Eosinophils Relative: 1.6 % (ref 0.0–5.0)
HCT: 36.5 % (ref 36.0–46.0)
Hemoglobin: 12.1 g/dL (ref 12.0–15.0)
Lymphocytes Relative: 36.1 % (ref 12.0–46.0)
Lymphs Abs: 2.9 10*3/uL (ref 0.7–4.0)
MCHC: 33.1 g/dL (ref 30.0–36.0)
MCV: 93.8 fl (ref 78.0–100.0)
Monocytes Absolute: 0.7 10*3/uL (ref 0.1–1.0)
Monocytes Relative: 8.1 % (ref 3.0–12.0)
Neutro Abs: 4.3 10*3/uL (ref 1.4–7.7)
Neutrophils Relative %: 53.5 % (ref 43.0–77.0)
Platelets: 178 10*3/uL (ref 150.0–400.0)
RBC: 3.89 Mil/uL (ref 3.87–5.11)
RDW: 12.4 % (ref 11.5–15.5)
WBC: 8.1 10*3/uL (ref 4.0–10.5)

## 2021-12-21 LAB — MAGNESIUM: Magnesium: 2.1 mg/dL (ref 1.5–2.5)

## 2021-12-21 MED ORDER — DENOSUMAB 60 MG/ML ~~LOC~~ SOSY
60.0000 mg | PREFILLED_SYRINGE | Freq: Once | SUBCUTANEOUS | Status: AC
  Administered 2021-12-21: 60 mg via SUBCUTANEOUS

## 2021-12-21 MED ORDER — GLUCOSAMINE-CHONDROITIN 250-200 MG PO TABS: 1.0000 | ORAL_TABLET | Freq: Every day | ORAL | 3 refills | Status: DC

## 2021-12-21 MED ORDER — BISOPROLOL-HYDROCHLOROTHIAZIDE 5-6.25 MG PO TABS: ORAL_TABLET | ORAL | 3 refills | Status: DC

## 2021-12-21 MED ORDER — FISH OIL 1000 MG PO CAPS: 2000.0000 mg | ORAL_CAPSULE | Freq: Two times a day (BID) | ORAL | 3 refills | Status: DC

## 2021-12-21 MED ORDER — VIACTIV CALCIUM PLUS D 650-12.5-40 MG-MCG PO CHEW: CHEWABLE_TABLET | ORAL | 3 refills | Status: DC

## 2021-12-21 MED ORDER — VITAMIN D (ERGOCALCIFEROL) 1.25 MG (50000 UNIT) PO CAPS: 50000.0000 [IU] | ORAL_CAPSULE | ORAL | 3 refills | Status: DC

## 2021-12-21 NOTE — Patient Instructions (Signed)
Check blood pressure at home and record and bring readings to follow up  Bring your arm cuff at follow up and set up one month recheck.

## 2021-12-21 NOTE — Progress Notes (Signed)
Established Patient Office Visit  Subjective:  Patient ID: [name], female    DOB: 1932/01/30  Age: 88 y.o. MRN: [id number]  CC:  Chief Complaint  Patient presents with   Medication Refill   Hypertension    HPI [name] presents for medical follow-up.  She has history of hypertension and osteoporosis.  She was here for her Prolia injection today.  She is getting these every 6 months.  Has not had lab work since last year.  She does take calcium and vitamin D regularly.  No recent falls.  She had hip replacement surgery following fall over a year ago.  She has complained of some fatigue with things like going to the mailbox.  She wonders if her hemoglobin might be low again and she has had anemia multiple times in the past.  No chest pains.  She takes Ziac for hypertension.  She has wrist and arm, but not monitoring blood pressure recently.  No headaches.  No peripheral edema.  Compliant with therapy.  History of low vitamin D.  She is on vitamin D replacement 50,000 international units once weekly.  Scheduled for cataract surgery in early March.  Past Medical History:  Diagnosis Date   Constipation    Diverticulosis    Hypertension    Inguinal hernia, left    Irritable bowel syndrome    Osteoporosis    UTI (lower urinary tract infection)    hx of frequent uti's    Past Surgical History:  Procedure Laterality Date   APPENDECTOMY     BREAST SURGERY Left    biopsy   CERVICAL FUSION     colon polyps     HEMORRHOID SURGERY     HIP SURGERY Right    INGUINAL HERNIA REPAIR Left 01/13/2017   Procedure: LEFT INGUINAL HERNIA REPAIR WITH MESH;  Surgeon: Jackolyn Confer, MD;  Location: [facility];  Service: General;  Laterality: Left;   INSERTION OF MESH Left 01/13/2017   Procedure: INSERTION OF MESH;  Surgeon: Jackolyn Confer, MD;  Location: [facility];  Service: General;  Laterality: Left;   LUMBAR FUSION     LUMBAR  LAMINECTOMY     partial resection of colon for tumor     SHOULDER SURGERY Left    TOTAL HIP ARTHROPLASTY     right   TOTAL HIP REVISION Right 11/17/2020   Procedure: ORIF of Right periprosthetic fracture;  Surgeon: Paralee Cancel, MD;  Location: [facility];  Service: Orthopedics;  Laterality: Right;    Family History  Problem Relation Age of Onset   Colon cancer Mother    Cancer Father    Lung cancer Father    Ovarian cancer Sister    Breast cancer Sister    Liver cancer Brother    Heart disease Brother     Social History   Socioeconomic History   Marital status: Married    Spouse name: Not on file   Number of children: Not on file   Years of education: Not on file   Highest education level: Not on file  Occupational History   Occupation: retired    Fish farm manager: RETIRED  Tobacco Use   Smoking status: Never   Smokeless tobacco: Never  Vaping Use   Vaping Use: Never used  Substance and Sexual Activity   Alcohol use: No   Drug use: No   Sexual activity: Not Currently  Other Topics Concern   Not on file  Social History Narrative  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   Married x 68 years   1 child   2 grand and 1.5 great grands    Social Determinants of Health   Financial Resource Strain: Low Risk    Difficulty of Paying Living Expenses: Not hard at all  Food Insecurity: No Food Insecurity   Worried About Charity fundraiser in the Last Year: Never true   Arboriculturist in the Last Year: Never true  Transportation Needs: No Transportation Needs   Lack of Transportation (Medical): No   Lack of Transportation (Non-Medical): No  Physical Activity: Sufficiently Active   Days of Exercise per Week: 7 days   Minutes of Exercise per Session: 30 min  Stress: No Stress Concern Present   Feeling of Stress : Not at all  Social Connections: Socially Integrated   Frequency of Communication with Friends and Family: More than three times a week   Frequency of Social Gatherings with  Friends and Family: More than three times a week   Attends Religious Services: More than 4 times per year   Active Member of [employer] or Organizations: Yes   Attends Music therapist: More than 4 times per year   Marital Status: Married  Human resources officer Violence: Not At Risk   Fear of Current or Ex-Partner: No   Emotionally Abused: No   Physically Abused: No   Sexually Abused: No    Outpatient Medications Prior to Visit  Medication Sig Dispense Refill   feeding supplement (BOOST / RESOURCE BREEZE) LIQD Take 1 Container by mouth 2 (two) times daily.     Multiple Vitamin (MULTIVITAMIN WITH MINERALS) TABS tablet Take 1 tablet by mouth daily.     bisoprolol-hydrochlorothiazide (ZIAC) 5-6.25 MG tablet TAKE 1 TABLET BY MOUTH DAILY. NEEDS APPT FOR FURTHER REFILLS 90 tablet 0   Calcium-Vitamin D-Vitamin K (VIACTIV CALCIUM PLUS D) 650-12.5-40 MG-MCG-MCG CHEW As directed (Patient taking differently: 2 tablets daily.) 90 tablet 3   Glucosamine-Chondroitin 250-200 MG TABS Take 1 tablet by mouth daily. 90 tablet 3   Omega-3 Fatty Acids (FISH OIL) 1000 MG CAPS Take 2 capsules (2,000 mg total) by mouth 2 (two) times daily. (Patient taking differently: Take 1,000 mg by mouth daily.) 120 capsule 3   Vitamin D, Ergocalciferol, (DRISDOL) 1.25 MG (50000 UNIT) CAPS capsule TAKE 1 CAPSULE (50,000 UNITS TOTAL) BY MOUTH EVERY 7 (SEVEN) DAYS 12 capsule 2   ofloxacin (OCUFLOX) 0.3 % ophthalmic solution      prednisoLONE acetate (PRED FORTE) 1 % ophthalmic suspension SMARTSIG:In Eye(s)     No facility-administered medications prior to visit.    Allergies  Allergen Reactions   Codeine     REACTION: headaches   Flagyl [Metronidazole Hcl]     nausea    ROS Review of Systems  Constitutional:  Negative for fatigue.  Eyes:  Negative for visual disturbance.  Respiratory:  Negative for cough, chest tightness, shortness of breath and wheezing.   Cardiovascular:  Negative for chest pain, palpitations  and leg swelling.  Neurological:  Negative for dizziness, seizures, syncope, weakness, light-headedness and headaches.     Objective:    Physical Exam Constitutional:      Appearance: She is well-developed.  Eyes:     Pupils: Pupils are equal, round, and reactive to light.  Neck:     Thyroid: No thyromegaly.     Vascular: No JVD.  Cardiovascular:     Rate and Rhythm: Normal rate and regular rhythm.  Heart sounds:    No gallop.  Pulmonary:     Effort: Pulmonary effort is normal. No respiratory distress.     Breath sounds: Normal breath sounds. No wheezing or rales.  Musculoskeletal:     Cervical back: Neck supple.     Right lower leg: No edema.     Left lower leg: No edema.  Neurological:     General: No focal deficit present.     Mental Status: She is alert.    BP (!) 160/78 (BP Location: Left Arm, Cuff Size: Normal)    Pulse 69    Temp 97.8 F (36.6 C) (Oral)    Wt 118 lb 12.8 oz (53.9 kg)    SpO2 96%    BMI 22.45 kg/m  Wt Readings from Last 3 Encounters:  12/21/21 118 lb 12.8 oz (53.9 kg)  10/28/21 119 lb (54 kg)  06/03/21 119 lb 11.2 oz (54.3 kg)     Health Maintenance Due  Topic Date Due   COVID-19 Vaccine (4 - Booster for Pfizer series) 11/02/2020    There are no preventive care reminders to display for this patient.  Lab Results  Component Value Date   TSH 2.88 02/16/2021   Lab Results  Component Value Date   WBC 7.5 02/16/2021   HGB 12.0 02/16/2021   HCT 34.9 (L) 02/16/2021   MCV 93.4 02/16/2021   PLT 172.0 02/16/2021   Lab Results  Component Value Date   NA 138 11/28/2020   K 3.7 11/28/2020   CO2 21 (L) 11/28/2020   GLUCOSE 99 11/28/2020   BUN 26 (H) 11/28/2020   CREATININE 0.56 11/28/2020   BILITOT 0.8 11/15/2020   ALKPHOS 35 (L) 11/15/2020   AST 21 11/15/2020   ALT 11 11/15/2020   PROT 6.9 02/16/2021   ALBUMIN 3.0 (L) 11/15/2020   CALCIUM 8.5 (L) 11/28/2020   ANIONGAP 14 11/28/2020   GFR 71.15 09/17/2019   Lab Results   Component Value Date   CHOL 205 (H) 05/21/2013   Lab Results  Component Value Date   HDL 58.40 05/21/2013   Lab Results  Component Value Date   LDLCALC 110 (H) 04/14/2012   Lab Results  Component Value Date   TRIG 95.0 05/21/2013   Lab Results  Component Value Date   CHOLHDL 4 05/21/2013   No results found for: HGBA1C    Assessment & Plan:   #1 osteoporosis.  Patient on Prolia injections every 6 months.  Check labs today with basic metabolic panel, magnesium level, vitamin D level.  Continue regular calcium and vitamin D supplementation and regular weightbearing exercise.  #2 hypertension.  Suboptimally controlled.  Patient suspects whitecoat syndrome.  We recommend she monitor blood pressure closely at home over the next month and write down readings and bring back her cuff to compare with ours in 1 month.  If still up at that time consider addition of medication such as amlodipine.  She appears to have isolated systolic reading up-today Handout on DASH diet given  #3 fatigue.  Patient concerned because of prior history of anemia.  Recheck CBC.   Meds ordered this encounter  Medications   denosumab (PROLIA) injection 60 mg    Order Specific Question:   Patient is enrolled in [facility] program for this medication and I have provided a copy of the Prolia Medication Guide and Patient Brochure.    Answer:   No    Order Specific Question:   I have reviewed with the patient the information  in the Prolia Medication Guide and Patient Counseling Chart including the serious risks of Prolia and symptoms of each risk.    Answer:   No    Order Specific Question:   I have advised the patient to seek medical attention if they have signs or symptoms of any of the serious risks.    Answer:   Yes   bisoprolol-hydrochlorothiazide (ZIAC) 5-6.25 MG tablet    Sig: TAKE 1 TABLET BY MOUTH DAILY.    Dispense:  90 tablet    Refill:  3   Glucosamine-Chondroitin 250-200 MG TABS    Sig: Take 1 tablet  by mouth daily.    Dispense:  90 tablet    Refill:  3   Omega-3 Fatty Acids (FISH OIL) 1000 MG CAPS    Sig: Take 2 capsules (2,000 mg total) by mouth 2 (two) times daily.    Dispense:  360 capsule    Refill:  3   Vitamin D, Ergocalciferol, (DRISDOL) 1.25 MG (50000 UNIT) CAPS capsule    Sig: Take 1 capsule (50,000 Units total) by mouth every 7 (seven) days.    Dispense:  12 capsule    Refill:  3   Calcium-Vitamin D-Vitamin K (VIACTIV CALCIUM PLUS D) 650-12.5-40 MG-MCG-MCG CHEW    Sig: As directed    Dispense:  90 tablet    Refill:  3    Follow-up: Return in about 1 month (around 01/18/2022).    [name], MD

## 2021-12-22 ENCOUNTER — Ambulatory Visit: Payer: Medicare Other

## 2021-12-22 DIAGNOSIS — M810 Age-related osteoporosis without current pathological fracture: Secondary | ICD-10-CM

## 2021-12-22 DIAGNOSIS — I10 Essential (primary) hypertension: Secondary | ICD-10-CM | POA: Diagnosis not present

## 2021-12-22 LAB — VITAMIN D 25 HYDROXY (VIT D DEFICIENCY, FRACTURES): VITD: 96.45 ng/mL (ref 30.00–100.00)

## 2022-01-01 DIAGNOSIS — H2511 Age-related nuclear cataract, right eye: Secondary | ICD-10-CM | POA: Diagnosis not present

## 2022-01-01 DIAGNOSIS — H25811 Combined forms of age-related cataract, right eye: Secondary | ICD-10-CM | POA: Diagnosis not present

## 2022-01-22 DIAGNOSIS — H2512 Age-related nuclear cataract, left eye: Secondary | ICD-10-CM | POA: Diagnosis not present

## 2022-01-22 DIAGNOSIS — H25812 Combined forms of age-related cataract, left eye: Secondary | ICD-10-CM | POA: Diagnosis not present

## 2022-02-08 ENCOUNTER — Encounter: Payer: Self-pay | Admitting: Family Medicine

## 2022-02-08 ENCOUNTER — Ambulatory Visit (INDEPENDENT_AMBULATORY_CARE_PROVIDER_SITE_OTHER): Payer: Medicare Other | Admitting: Family Medicine

## 2022-02-08 VITALS — BP 148/68 | HR 82 | Temp 97.7°F | Ht 61.0 in | Wt 119.3 lb

## 2022-02-08 DIAGNOSIS — M67449 Ganglion, unspecified hand: Secondary | ICD-10-CM | POA: Diagnosis not present

## 2022-02-08 DIAGNOSIS — K644 Residual hemorrhoidal skin tags: Secondary | ICD-10-CM | POA: Diagnosis not present

## 2022-02-08 DIAGNOSIS — I10 Essential (primary) hypertension: Secondary | ICD-10-CM

## 2022-02-08 MED ORDER — HYDROCORT-PRAMOXINE (PERIANAL) 2.5-1 % EX CREA: 1.0000 "application " | TOPICAL_CREAM | Freq: Three times a day (TID) | CUTANEOUS | 2 refills | Status: DC

## 2022-02-08 NOTE — Patient Instructions (Signed)
If you remember, please bring in your BP cuff at follow up.  ?

## 2022-02-08 NOTE — Progress Notes (Signed)
? ?Established Patient Office Visit ? ?Subjective:  ?Patient ID: [name], female    DOB: [birth year]  Age: 88 y.o. MRN: [id number] ? ?CC:  ?Chief Complaint  ?Patient presents with  ? Follow-up  ? ? ?HPI ?[name] presents for multiple issues as below ? ?History of hypertension.  She takes Ziac 1 daily.  Brings in several home readings and these have consistently been 195K systolic and 93O to 67T diastolic.  Unfortunately, she did not bring her in her cuff today for review.  Generally feels well.  No recent dizziness.  No chest pains.  No headache.  No peripheral edema ? ?She has history of external hemorrhoids.  Has used Analpram HC cream in the past and requesting refill.  No recent change of bowel movements. ? ?Cystic lesion left fifth digit.  Nonpainful.  First noted about a month ago. ? ?Past Medical History:  ?Diagnosis Date  ? Constipation   ? Diverticulosis   ? Hypertension   ? Inguinal hernia, left   ? Irritable bowel syndrome   ? Osteoporosis   ? UTI (lower urinary tract infection)   ? hx of frequent uti's  ? ? ?Past Surgical History:  ?Procedure Laterality Date  ? APPENDECTOMY    ? BREAST SURGERY Left   ? biopsy  ? CERVICAL FUSION    ? colon polyps    ? HEMORRHOID SURGERY    ? HIP SURGERY Right   ? INGUINAL HERNIA REPAIR Left 01/13/2017  ? Procedure: LEFT INGUINAL HERNIA REPAIR WITH MESH;  Surgeon: Jackolyn Confer, MD;  Location: [facility];  Service: General;  Laterality: Left;  ? INSERTION OF MESH Left 01/13/2017  ? Procedure: INSERTION OF MESH;  Surgeon: Jackolyn Confer, MD;  Location: [facility];  Service: General;  Laterality: Left;  ? LUMBAR FUSION    ? LUMBAR LAMINECTOMY    ? partial resection of colon for tumor    ? SHOULDER SURGERY Left   ? TOTAL HIP ARTHROPLASTY    ? right  ? TOTAL HIP REVISION Right 11/17/2020  ? Procedure: ORIF of Right periprosthetic fracture;  Surgeon: Paralee Cancel, MD;  Location: [facility];  Service: Orthopedics;   Laterality: Right;  ? ? ?Family History  ?Problem Relation Age of Onset  ? Colon cancer Mother   ? Cancer Father   ? Lung cancer Father   ? Ovarian cancer Sister   ? Breast cancer Sister   ? Liver cancer Brother   ? Heart disease Brother   ? ? ?Social History  ? ?Socioeconomic History  ? Marital status: Married  ?  Spouse name: Not on file  ? Number of children: Not on file  ? Years of education: Not on file  ? Highest education level: Not on file  ?Occupational History  ? Occupation: retired  ?  Employer: RETIRED  ?Tobacco Use  ? Smoking status: Never  ? Smokeless tobacco: Never  ?Vaping Use  ? Vaping Use: Never used  ?Substance and Sexual Activity  ? Alcohol use: No  ? Drug use: No  ? Sexual activity: Not Currently  ?Other Topics Concern  ? Not on file  ?Social History Narrative  ? 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 Married x 62 years  ? 1 child  ? 2 grand and 1.5 great grands   ? ?Social Determinants of Health  ? ?Financial Resource Strain: Low Risk   ? Difficulty of Paying Living Expenses: Not hard at all  ?Food  Insecurity: No Food Insecurity  ? Worried About Charity fundraiser in the Last Year: Never true  ? Ran Out of Food in the Last Year: Never true  ?Transportation Needs: No Transportation Needs  ? Lack of Transportation (Medical): No  ? Lack of Transportation (Non-Medical): No  ?Physical Activity: Sufficiently Active  ? Days of Exercise per Week: 7 days  ? Minutes of Exercise per Session: 30 min  ?Stress: No Stress Concern Present  ? Feeling of Stress : Not at all  ?Social Connections: Socially Integrated  ? Frequency of Communication with Friends and Family: More than three times a week  ? Frequency of Social Gatherings with Friends and Family: More than three times a week  ? Attends Religious Services: More than 4 times per year  ? Active Member of Clubs or Organizations: Yes  ? Attends Archivist Meetings: More than 4 times per year  ? Marital Status: Married  ?Intimate Partner Violence:  Not At Risk  ? Fear of Current or Ex-Partner: No  ? Emotionally Abused: No  ? Physically Abused: No  ? Sexually Abused: No  ? ? ?Outpatient Medications Prior to Visit  ?Medication Sig Dispense Refill  ? bisoprolol-hydrochlorothiazide (ZIAC) 5-6.25 MG tablet TAKE 1 TABLET BY MOUTH DAILY. 90 tablet 3  ? Calcium-Vitamin D-Vitamin K (VIACTIV CALCIUM PLUS D) 650-12.5-40 MG-MCG-MCG CHEW As directed 90 tablet 3  ? feeding supplement (BOOST / RESOURCE BREEZE) LIQD Take 1 Container by mouth 2 (two) times daily.    ? Glucosamine-Chondroitin 250-200 MG TABS Take 1 tablet by mouth daily. 90 tablet 3  ? Multiple Vitamin (MULTIVITAMIN WITH MINERALS) TABS tablet Take 1 tablet by mouth daily.    ? ofloxacin (OCUFLOX) 0.3 % ophthalmic solution     ? Omega-3 Fatty Acids (FISH OIL) 1000 MG CAPS Take 2 capsules (2,000 mg total) by mouth 2 (two) times daily. 360 capsule 3  ? prednisoLONE acetate (PRED FORTE) 1 % ophthalmic suspension SMARTSIG:In Eye(s)    ? Vitamin D, Ergocalciferol, (DRISDOL) 1.25 MG (50000 UNIT) CAPS capsule Take 1 capsule (50,000 Units total) by mouth every 7 (seven) days. 12 capsule 3  ? ?No facility-administered medications prior to visit.  ? ? ?Allergies  ?Allergen Reactions  ? Codeine   ?  REACTION: headaches  ? Flagyl [Metronidazole Hcl]   ?  nausea  ? ? ?ROS ?Review of Systems  ?Constitutional:  Negative for fatigue.  ?Eyes:  Negative for visual disturbance.  ?Respiratory:  Negative for cough, chest tightness, shortness of breath and wheezing.   ?Cardiovascular:  Negative for chest pain, palpitations and leg swelling.  ?Gastrointestinal:  Negative for abdominal pain and blood in stool.  ?Musculoskeletal:  Positive for arthralgias.  ?Neurological:  Negative for dizziness, seizures, syncope, weakness, light-headedness and headaches.  ? ?  ?Objective:  ?  ?Physical Exam ?Vitals reviewed.  ?Cardiovascular:  ?   Rate and Rhythm: Normal rate.  ?Pulmonary:  ?   Effort: Pulmonary effort is normal.  ?   Breath sounds:  Normal breath sounds.  ?Musculoskeletal:  ?   Comments: He has significant arthritis involving both hands PIP and DIP joints.  Left fifth digit reveals mobile approximately 3 mm cystic lesion consistent with likely ganglion cyst over the PIP joint  ?Neurological:  ?   Mental Status: She is alert.  ? ? ?BP (!) 148/68 (BP Location: Left Arm, Cuff Size: Normal)   Pulse 82   Temp 97.7 ?F (36.5 ?C) (Oral)   Ht '5\' 1"'$  (1.549  m)   Wt 119 lb 4.8 oz (54.1 kg)   SpO2 96%   BMI 22.54 kg/m?  ?Wt Readings from Last 3 Encounters:  ?02/08/22 119 lb 4.8 oz (54.1 kg)  ?12/21/21 118 lb 12.8 oz (53.9 kg)  ?10/28/21 119 lb (54 kg)  ? ? ? ?Health Maintenance Due  ?Topic Date Due  ? Zoster Vaccines- Shingrix (1 of 2) Never done  ? COVID-19 Vaccine (4 - Booster for Pfizer series) 11/02/2020  ? ? ?There are no preventive care reminders to display for this patient. ? ?Lab Results  ?Component Value Date  ? TSH 2.88 02/16/2021  ? ?Lab Results  ?Component Value Date  ? WBC 8.1 12/21/2021  ? HGB 12.1 12/21/2021  ? HCT 36.5 12/21/2021  ? MCV 93.8 12/21/2021  ? PLT 178.0 12/21/2021  ? ?Lab Results  ?Component Value Date  ? NA 135 12/21/2021  ? K 4.1 12/21/2021  ? CO2 31 12/21/2021  ? GLUCOSE 93 12/21/2021  ? BUN 22 12/21/2021  ? CREATININE 0.80 12/21/2021  ? BILITOT 0.8 11/15/2020  ? ALKPHOS 35 (L) 11/15/2020  ? AST 21 11/15/2020  ? ALT 11 11/15/2020  ? PROT 6.9 02/16/2021  ? ALBUMIN 3.0 (L) 11/15/2020  ? CALCIUM 10.4 12/21/2021  ? ANIONGAP 14 11/28/2020  ? GFR 66.06 12/21/2021  ? ?Lab Results  ?Component Value Date  ? CHOL 205 (H) 05/21/2013  ? ?Lab Results  ?Component Value Date  ? HDL 58.40 05/21/2013  ? ?Lab Results  ?Component Value Date  ? LDLCALC 110 (H) 04/14/2012  ? ?Lab Results  ?Component Value Date  ? TRIG 95.0 05/21/2013  ? ?Lab Results  ?Component Value Date  ? CHOLHDL 4 05/21/2013  ? ?No results found for: HGBA1C ? ?  ?Assessment & Plan:  ? ?Problem List Items Addressed This Visit   ? ?  ? Unprioritized  ? Essential  hypertension - Primary  ? ?Other Visit Diagnoses   ? ? External hemorrhoids      ? Ganglion cyst of finger      ? ?  ?-Blood pressure well controlled by home readings.  We have strongly encouraged her to bring in her cuff to com

## 2022-02-09 ENCOUNTER — Other Ambulatory Visit: Payer: Self-pay | Admitting: Family Medicine

## 2022-02-10 ENCOUNTER — Other Ambulatory Visit: Payer: Self-pay | Admitting: Family Medicine

## 2022-02-10 MED ORDER — HYDROCORTISONE (PERIANAL) 2.5 % EX CREA: 1.0000 "application " | TOPICAL_CREAM | Freq: Two times a day (BID) | CUTANEOUS | 0 refills | Status: DC

## 2022-02-10 NOTE — Telephone Encounter (Signed)
New rx sent

## 2022-02-23 ENCOUNTER — Telehealth: Payer: Self-pay | Admitting: Pharmacist

## 2022-02-23 NOTE — Chronic Care Management (AMB) (Signed)
Chronic Care Management Pharmacy Assistant   Name: [name]  MRN: [id number] DOB: December 21, 1931  Reason for Encounter: Disease State / Hypertension Assessment Call   Conditions to be addressed/monitored: HTN   Recent office visits:  02/08/2022 Tammy Littler MD - Patient was seen for essential hypertension and additional issues. Started Analpram HC 1 application rectal 3 times daily. Follow up in 3 months.   12/21/2021 Tammy Littler MD - Patient was seen for Other osteoporosis, unspecified pathological fracture presence and additional issues. Follow up in 1 month.  Recent consult visits:  None  Hospital visits:  None  Medications: Outpatient Encounter Medications as of 02/23/2022  Medication Sig Note   bisoprolol-hydrochlorothiazide (ZIAC) 5-6.25 MG tablet TAKE 1 TABLET BY MOUTH DAILY.    Calcium-Vitamin D-Vitamin K (VIACTIV CALCIUM PLUS D) 650-12.5-40 MG-MCG-MCG CHEW As directed    feeding supplement (BOOST / RESOURCE BREEZE) LIQD Take 1 Container by mouth 2 (two) times daily. 11/28/2020: 0900/2100   Glucosamine-Chondroitin 250-200 MG TABS Take 1 tablet by mouth daily.    hydrocortisone (ANUSOL-HC) 2.5 % rectal cream Place 1 application. rectally 2 (two) times daily.    hydrocortisone-pramoxine (ANALPRAM HC) 2.5-1 % rectal cream Place 1 application. rectally 3 (three) times daily.    Multiple Vitamin (MULTIVITAMIN WITH MINERALS) TABS tablet Take 1 tablet by mouth daily.    ofloxacin (OCUFLOX) 0.3 % ophthalmic solution     Omega-3 Fatty Acids (FISH OIL) 1000 MG CAPS Take 2 capsules (2,000 mg total) by mouth 2 (two) times daily.    prednisoLONE acetate (PRED FORTE) 1 % ophthalmic suspension SMARTSIG:In Eye(s)    Vitamin D, Ergocalciferol, (DRISDOL) 1.25 MG (50000 UNIT) CAPS capsule Take 1 capsule (50,000 Units total) by mouth every 7 (seven) days.    No facility-administered encounter medications on file as of 02/23/2022.  Fill History:  BISOPROLOL-HCTZ 5-6.25 MG TAB  12/21/2021 90   VITAMIN D2 1.'25MG'$ (50,000 UNIT) 01/18/2022 83   HYDROCORTISO 2.5%RECT CRE 02/10/2022 20   KETOROLAC 0.5% OPHTH SOLUTION 12/18/2021 15   Reviewed chart prior to disease state call. Spoke with patient regarding BP  Recent Office Vitals: BP Readings from Last 3 Encounters:  02/08/22 (!) 148/68  12/21/21 (!) 160/78  06/03/21 132/80   Pulse Readings from Last 3 Encounters:  02/08/22 82  12/21/21 69  06/03/21 95    Wt Readings from Last 3 Encounters:  02/08/22 119 lb 4.8 oz (54.1 kg)  12/21/21 118 lb 12.8 oz (53.9 kg)  10/28/21 119 lb (54 kg)     Kidney Function Lab Results  Component Value Date/Time   CREATININE 0.80 12/21/2021 10:05 AM   CREATININE 0.56 11/28/2020 06:00 AM   CREATININE 0.77 08/05/2020 03:59 PM   GFR 66.06 12/21/2021 10:05 AM   GFRNONAA >60 11/28/2020 06:00 AM   GFRAA 105 09/04/2007 10:38 AM       Latest Ref Rng & Units 12/21/2021   10:05 AM 11/28/2020    6:00 AM 11/20/2020    1:29 AM  BMP  Glucose 70 - 99 mg/dL 93   99   111    BUN 6 - 23 mg/dL '22   26   17    '$ Creatinine 0.40 - 1.20 mg/dL 0.80   0.56   0.58    Sodium 135 - 145 mEq/L 135   138   135    Potassium 3.5 - 5.1 mEq/L 4.1   3.7   4.4    Chloride 96 - 112 mEq/L 97   103  103    CO2 19 - 32 mEq/L '31   21   26    '$ Calcium 8.4 - 10.5 mg/dL 10.4   8.5   7.3      Current antihypertensive regimen:  Ziac 5/6.25 mg take 1 tablet daily  How often are you checking your Blood Pressure? Patient states Dr [name] told her she was good and didn't need to check her blood pressures at home.   Current home BP readings: Patient is not checking  What recent interventions/DTPs have been made by any provider to improve Blood Pressure control since last CPP Visit: No recent interventions  Any recent hospitalizations or ED visits since last visit with CPP? No recent hospital visits.   What diet changes have been made to improve Blood Pressure Control?  Patient makes healthy  choices Breakfast - patient will have eggs with tomato or cereal Lunch - patient will have a sandwich Dinner - patient will have a meal containing a meat and vegetable.   What exercise is being done to improve your Blood Pressure Control?  Patient walks daily and does some exercises.   Adherence Review: Is the patient currently on ACE/ARB medication? Yes Does the patient have >5 day gap between last estimated fill dates? No  Care Gaps: AWV - scheduled 10/29/2022 Last BP - 132/80 on 06/03/2022 Covid booster - overdue   Star Rating Drug: None  [place] Pharmacist Assistant [phone]

## 2022-03-30 DIAGNOSIS — L821 Other seborrheic keratosis: Secondary | ICD-10-CM | POA: Diagnosis not present

## 2022-03-30 DIAGNOSIS — M67442 Ganglion, left hand: Secondary | ICD-10-CM | POA: Diagnosis not present

## 2022-03-30 DIAGNOSIS — L570 Actinic keratosis: Secondary | ICD-10-CM | POA: Diagnosis not present

## 2022-04-12 DIAGNOSIS — M25532 Pain in left wrist: Secondary | ICD-10-CM | POA: Diagnosis not present

## 2022-04-12 DIAGNOSIS — M79642 Pain in left hand: Secondary | ICD-10-CM | POA: Diagnosis not present

## 2022-04-26 DIAGNOSIS — M79645 Pain in left finger(s): Secondary | ICD-10-CM | POA: Diagnosis not present

## 2022-04-29 DIAGNOSIS — S62617A Displaced fracture of proximal phalanx of left little finger, initial encounter for closed fracture: Secondary | ICD-10-CM | POA: Diagnosis not present

## 2022-04-29 DIAGNOSIS — M79645 Pain in left finger(s): Secondary | ICD-10-CM | POA: Diagnosis not present

## 2022-05-07 DIAGNOSIS — X58XXXA Exposure to other specified factors, initial encounter: Secondary | ICD-10-CM | POA: Diagnosis not present

## 2022-05-07 DIAGNOSIS — S62617A Displaced fracture of proximal phalanx of left little finger, initial encounter for closed fracture: Secondary | ICD-10-CM | POA: Diagnosis not present

## 2022-05-07 DIAGNOSIS — Y999 Unspecified external cause status: Secondary | ICD-10-CM | POA: Diagnosis not present

## 2022-05-13 DIAGNOSIS — S62617A Displaced fracture of proximal phalanx of left little finger, initial encounter for closed fracture: Secondary | ICD-10-CM | POA: Diagnosis not present

## 2022-05-27 DIAGNOSIS — M79645 Pain in left finger(s): Secondary | ICD-10-CM | POA: Diagnosis not present

## 2022-05-27 DIAGNOSIS — S62617A Displaced fracture of proximal phalanx of left little finger, initial encounter for closed fracture: Secondary | ICD-10-CM | POA: Diagnosis not present

## 2022-06-08 ENCOUNTER — Telehealth: Payer: Self-pay | Admitting: Family Medicine

## 2022-06-08 NOTE — Telephone Encounter (Signed)
Patient's husband states patient thinks it is time for her Prolia injection and is requesting a call from Judson Roch.

## 2022-06-09 NOTE — Telephone Encounter (Signed)
I spoke with patient. She is scheduled for 9/1 at 10am.

## 2022-06-09 NOTE — Telephone Encounter (Signed)
Pt ready for scheduling on or after 06/16/22  Out-of-pocket cost due at time of visit: $0  Primary: [place] [employer] Prolia co-insurance: $0 Admin fee co-insurance: $0  Secondary: [employer] Prolia co-insurance: $0 Admin fee co-insurance: $0  Deductible: $226 met of $226  Prior Auth: not needed PA# Valid:     ** This summary of benefits is an estimation of the patient's out-of-pocket cost. Exact cost may very based on individual plan coverage.

## 2022-06-15 ENCOUNTER — Telehealth: Payer: Medicare Other

## 2022-06-15 DIAGNOSIS — M79645 Pain in left finger(s): Secondary | ICD-10-CM | POA: Diagnosis not present

## 2022-06-25 ENCOUNTER — Ambulatory Visit (INDEPENDENT_AMBULATORY_CARE_PROVIDER_SITE_OTHER): Payer: Medicare Other

## 2022-06-25 DIAGNOSIS — M818 Other osteoporosis without current pathological fracture: Secondary | ICD-10-CM | POA: Diagnosis not present

## 2022-06-25 MED ORDER — DENOSUMAB 60 MG/ML ~~LOC~~ SOSY
60.0000 mg | PREFILLED_SYRINGE | Freq: Once | SUBCUTANEOUS | Status: AC
  Administered 2022-06-25: 60 mg via SUBCUTANEOUS

## 2022-06-25 NOTE — Progress Notes (Signed)
Per orders of Dr. Elease Hashimoto, injection of Prolia 60 mg given by Encarnacion Slates. Patient tolerated injection well.

## 2022-06-29 ENCOUNTER — Other Ambulatory Visit: Payer: Self-pay | Admitting: Family Medicine

## 2022-07-08 DIAGNOSIS — S62617A Displaced fracture of proximal phalanx of left little finger, initial encounter for closed fracture: Secondary | ICD-10-CM | POA: Diagnosis not present

## 2022-07-08 DIAGNOSIS — M79645 Pain in left finger(s): Secondary | ICD-10-CM | POA: Diagnosis not present

## 2022-07-08 DIAGNOSIS — S62617D Displaced fracture of proximal phalanx of left little finger, subsequent encounter for fracture with routine healing: Secondary | ICD-10-CM | POA: Diagnosis not present

## 2022-07-12 ENCOUNTER — Ambulatory Visit (INDEPENDENT_AMBULATORY_CARE_PROVIDER_SITE_OTHER): Payer: Medicare Other | Admitting: Family Medicine

## 2022-07-12 VITALS — BP 146/78 | HR 78 | Temp 97.7°F | Wt 118.0 lb

## 2022-07-12 DIAGNOSIS — Z23 Encounter for immunization: Secondary | ICD-10-CM | POA: Diagnosis not present

## 2022-07-12 DIAGNOSIS — R300 Dysuria: Secondary | ICD-10-CM

## 2022-07-12 DIAGNOSIS — I10 Essential (primary) hypertension: Secondary | ICD-10-CM

## 2022-07-12 LAB — POCT URINALYSIS DIPSTICK
Bilirubin, UA: NEGATIVE
Blood, UA: NEGATIVE
Glucose, UA: NEGATIVE
Ketones, UA: NEGATIVE
Nitrite, UA: NEGATIVE
Protein, UA: NEGATIVE
Spec Grav, UA: 1.015 (ref 1.010–1.025)
Urobilinogen, UA: 0.2 E.U./dL
pH, UA: 7.5 (ref 5.0–8.0)

## 2022-07-12 MED ORDER — CEPHALEXIN 500 MG PO CAPS: 500.0000 mg | ORAL_CAPSULE | Freq: Three times a day (TID) | ORAL | 0 refills | Status: DC

## 2022-07-12 NOTE — Progress Notes (Signed)
Established Patient Office Visit  Subjective   Patient ID: [name], female    DOB: [birth year]  Age: 88 y.o. MRN: [id number]  Chief Complaint  Patient presents with   Dysuria    X 2-3 days    HPI   Seen today with 2-day history of urine frequency and burning with urination.  Denies any flank pain, nausea, vomiting, fever, or gross hematuria.  Last UTI was about 2 years ago.  She has history of hypertension.  Not recently monitoring blood pressure at home.  She takes bisoprolol HCTZ.  No recent headaches.  She had electrolytes last February which were stable.  Past Medical History:  Diagnosis Date   Constipation    Diverticulosis    Hypertension    Inguinal hernia, left    Irritable bowel syndrome    Osteoporosis    UTI (lower urinary tract infection)    hx of frequent uti's   Past Surgical History:  Procedure Laterality Date   APPENDECTOMY     BREAST SURGERY Left    biopsy   CERVICAL FUSION     colon polyps     HEMORRHOID SURGERY     HIP SURGERY Right    INGUINAL HERNIA REPAIR Left 01/13/2017   Procedure: LEFT INGUINAL HERNIA REPAIR WITH MESH;  Surgeon: Jackolyn Confer, MD;  Location: [facility];  Service: General;  Laterality: Left;   INSERTION OF MESH Left 01/13/2017   Procedure: INSERTION OF MESH;  Surgeon: Jackolyn Confer, MD;  Location: [facility];  Service: General;  Laterality: Left;   LUMBAR FUSION     LUMBAR LAMINECTOMY     partial resection of colon for tumor     SHOULDER SURGERY Left    TOTAL HIP ARTHROPLASTY     right   TOTAL HIP REVISION Right 11/17/2020   Procedure: ORIF of Right periprosthetic fracture;  Surgeon: Paralee Cancel, MD;  Location: [facility];  Service: Orthopedics;  Laterality: Right;    reports that she has never smoked. She has never used smokeless tobacco. She reports that she does not drink alcohol and does not use drugs. family history includes Breast cancer in her sister; Cancer in her father;  Colon cancer in her mother; Heart disease in her brother; Liver cancer in her brother; Lung cancer in her father; Ovarian cancer in her sister. Allergies  Allergen Reactions   Codeine     REACTION: headaches   Flagyl [Metronidazole Hcl]     nausea    Review of Systems  Constitutional:  Negative for malaise/fatigue.  Eyes:  Negative for blurred vision.  Respiratory:  Negative for shortness of breath.   Cardiovascular:  Negative for chest pain.  Genitourinary:  Positive for dysuria and frequency. Negative for flank pain and hematuria.  Neurological:  Negative for dizziness, weakness and headaches.      Objective:     BP (!) 140/84 (BP Location: Left Arm, Patient Position: Sitting, Cuff Size: Normal)   Pulse 78   Temp 97.7 F (36.5 C) (Oral)   Wt 118 lb (53.5 kg)   SpO2 99%   BMI 22.30 kg/m    Physical Exam Vitals reviewed.  Constitutional:      Appearance: Normal appearance.  Cardiovascular:     Rate and Rhythm: Normal rate and regular rhythm.  Pulmonary:     Effort: Pulmonary effort is normal.     Breath sounds: Normal breath sounds.  Neurological:     Mental Status: She is alert.  Results for orders placed or performed in visit on 07/12/22  POC Urinalysis Dipstick  Result Value Ref Range   Color, UA     Clarity, UA     Glucose, UA Negative Negative   Bilirubin, UA negative    Ketones, UA negative    Spec Grav, UA 1.015 1.010 - 1.025   Blood, UA negative    pH, UA 7.5 5.0 - 8.0   Protein, UA Negative Negative   Urobilinogen, UA 0.2 0.2 or 1.0 E.U./dL   Nitrite, UA negative    Leukocytes, UA Small (1+) (A) Negative   Appearance     Odor        The ASCVD Risk score (Arnett DK, et al., 2019) failed to calculate for the following reasons:   The 2019 ASCVD risk score is only valid for ages 37 to 5    Assessment & Plan:   #1 dysuria.  Possible uncomplicated cystitis.  Urine dipstick shows only small leukocytes otherwise negative.  Urine culture  sent.  Cover with Keflex 500 mg 3 times daily pending culture results.  Stressed importance of good hydration.  #2 hypertension.  Treated with bisoprolol HCTZ.  Slightly up today.  Repeat after rest 146/78.  Recommend close monitoring at home for the next few days and be in touch if consistently greater than 841 systolic   No follow-ups on file.    [name], MD

## 2022-07-15 LAB — URINE CULTURE
MICRO NUMBER:: 13935165
SPECIMEN QUALITY:: ADEQUATE

## 2022-07-19 ENCOUNTER — Telehealth: Payer: Self-pay | Admitting: Family Medicine

## 2022-07-19 NOTE — Telephone Encounter (Signed)
Pt requesting call from [place] but says she is going out for groceries and will be back around 2pm today

## 2022-07-19 NOTE — Telephone Encounter (Signed)
Pt called, returning CMA's call. CMA was unavailable. Pt asked that CMA call back at their earliest convenience. 

## 2022-07-20 NOTE — Telephone Encounter (Signed)
Please see result note 

## 2022-10-29 ENCOUNTER — Ambulatory Visit (INDEPENDENT_AMBULATORY_CARE_PROVIDER_SITE_OTHER): Payer: Medicare Other

## 2022-10-29 VITALS — Ht 61.0 in | Wt 118.0 lb

## 2022-10-29 DIAGNOSIS — Z0000 Encounter for general adult medical examination without abnormal findings: Secondary | ICD-10-CM | POA: Diagnosis not present

## 2022-10-29 NOTE — Progress Notes (Signed)
Subjective:   [name] is a 89 y.o. female who presents for Medicare Annual (Subsequent) preventive examination.  Review of Systems    Virtual Visit via Telephone Note  I connected with  [name] on 10/29/22 at  1:15 PM EST by telephone and verified that I am speaking with the correct person using two identifiers.  Location: Patient: Home Provider: Office Persons participating in the virtual visit: patient/Nurse Health Advisor   I discussed the limitations, risks, security and privacy concerns of performing an evaluation and management service by telephone and the availability of in person appointments. The patient expressed understanding and agreed to proceed.  Interactive audio and video telecommunications were attempted between this nurse and patient, however failed, due to patient having technical difficulties OR patient did not have access to video capability.  We continued and completed visit with audio only.  Some vital signs may be absent or patient reported.   Criselda Peaches, LPN  Cardiac Risk Factors include: advanced age (>61mn, >>55women);hypertension     Objective:    Today's Vitals   10/29/22 1318  Weight: 118 lb (53.5 kg)  Height: '5\' 1"'$  (1.549 m)   Body mass index is 22.3 kg/m.     10/29/2022    1:26 PM 10/28/2021    1:08 PM 11/28/2020    5:18 AM 11/17/2020    2:12 PM 11/14/2020    3:12 PM 10/27/2020   10:46 AM 05/23/2018    2:21 PM  Advanced Directives  Does Patient Have a Medical Advance Directive? Yes Yes No Yes Yes Yes Yes  Type of [place]aramed[place]of [place] will [place]Living will  [place]Living will [place]Living will [place]anch[place] CalaverasLiving will   Does patient want to make changes to medical advance directive?  No - Patient declined No - Patient declined  No - Patient declined No - Patient declined   Copy of [place] Chart? No - copy requested No - copy requested  No - copy requested No - copy requested No - copy requested   Would patient like information on creating a medical advance directive?   No - Patient declined        Current Medications (verified) Outpatient Encounter Medications as of 10/29/2022  Medication Sig   bisoprolol-hydrochlorothiazide (ZIAC) 5-6.25 MG tablet TAKE 1 TABLET BY MOUTH DAILY.   Calcium-Vitamin D-Vitamin K (VIACTIV CALCIUM PLUS D) 650-12.5-40 MG-MCG-MCG CHEW As directed   cephALEXin (KEFLEX) 500 MG capsule Take 1 capsule (500 mg total) by mouth 3 (three) times daily.   feeding supplement (BOOST / RESOURCE BREEZE) LIQD Take 1 Container by mouth 2 (two) times daily.   Glucosamine-Chondroitin 250-200 MG TABS Take 1 tablet by mouth daily.   hydrocortisone (ANUSOL-HC) 2.5 % rectal cream Place 1 application. rectally 2 (two) times daily.   hydrocortisone-pramoxine (ANALPRAM HC) 2.5-1 % rectal cream Place 1 application. rectally 3 (three) times daily.   Multiple Vitamin (MULTIVITAMIN WITH MINERALS) TABS tablet Take 1 tablet by mouth daily.   ofloxacin (OCUFLOX) 0.3 % ophthalmic solution    Omega-3 Fatty Acids (FISH OIL) 1000 MG CAPS Take 2 capsules (2,000 mg total) by mouth 2 (two) times daily.   prednisoLONE acetate (PRED FORTE) 1 % ophthalmic suspension SMARTSIG:In Eye(s)   traMADol (ULTRAM) 50 MG tablet Take 50 mg by mouth every 6 (six) hours as needed. (Patient not taking: Reported on 07/12/2022)   Vitamin D, Ergocalciferol, (  DRISDOL) 1.25 MG (50000 UNIT) CAPS capsule TAKE 1 CAPSULE (50,000 UNITS TOTAL) BY MOUTH EVERY 7 (SEVEN) DAYS   No facility-administered encounter medications on file as of 10/29/2022.    Allergies (verified) Codeine and Flagyl [metronidazole hcl]   History: Past Medical History:  Diagnosis Date   Constipation    Diverticulosis    Hypertension    Inguinal hernia, left    Irritable bowel syndrome    Osteoporosis    UTI (lower urinary  tract infection)    hx of frequent uti's   Past Surgical History:  Procedure Laterality Date   APPENDECTOMY     BREAST SURGERY Left    biopsy   CERVICAL FUSION     colon polyps     FINGER SURGERY     HEMORRHOID SURGERY     HIP SURGERY Right    INGUINAL HERNIA REPAIR Left 01/13/2017   Procedure: LEFT INGUINAL HERNIA REPAIR WITH MESH;  Surgeon: Jackolyn Confer, MD;  Location[facility];  Service: General;  Laterality: Left;   INSERTION OF MESH Left 01/13/2017   Procedure: INSERTION OF MESH;  Surgeon: Jackolyn Confer, MD;  Location: [facility];  Service: General;  Laterality: Left;   LUMBAR FUSION     LUMBAR LAMINECTOMY     partial resection of colon for tumor     SHOULDER SURGERY Left    TOTAL HIP ARTHROPLASTY     right   TOTAL HIP REVISION Right 11/17/2020   Procedure: ORIF of Right periprosthetic fracture;  Surgeon: Paralee Cancel, MD;  Location: [facility];  Service: Orthopedics;  Laterality: Right;   Family History  Problem Relation Age of Onset   Colon cancer Mother    Cancer Father    Lung cancer Father    Ovarian cancer Sister    Breast cancer Sister    Liver cancer Brother    Heart disease Brother    Social History   Socioeconomic History   Marital status: Married    Spouse name: Not on file   Number of children: Not on file   Years of education: Not on file   Highest education level: Not on file  Occupational History   Occupation: retired    Fish farm manager: RETIRED  Tobacco Use   Smoking status: Never   Smokeless tobacco: Never  Vaping Use   Vaping Use: Never used  Substance and Sexual Activity   Alcohol use: No   Drug use: No   Sexual activity: Not Currently  Other Topics Concern   Not on file  Social History Narrative   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   Married x 69 years   1 child   2 grand and 1.5 great grands    Social Determinants of Health   Financial Resource Strain: Low Risk  (10/29/2022)   Overall Financial  Resource Strain (CARDIA)    Difficulty of Paying Living Expenses: Not hard at all  Food Insecurity: No Food Insecurity (10/29/2022)   Hunger Vital Sign    Worried About Running Out of Food in the Last Year: Never true    [place] in the Last Year: Never true  Transportation Needs: No Transportation Needs (10/29/2022)   PRAPARE - Hydrologist (Medical): No    Lack of Transportation (Non-Medical): No  Physical Activity: Insufficiently Active (10/29/2022)   Exercise Vital Sign    Days of Exercise per Week: 7 days    Minutes of Exercise per Session: 20  min  Stress: No Stress Concern Present (10/29/2022)   [place]    Feeling of Stress : Not at all  Social Connections: [place] (10/29/2022)   Social Connection and Isolation Panel [NHANES]    Frequency of Communication with Friends and Family: More than three times a week    Frequency of Social Gatherings with Friends and Family: More than three times a week    Attends Religious Services: More than 4 times per year    Active Member of [employer] or Organizations: Yes    Attends Music therapist: More than 4 times per year    Marital Status: Married    Tobacco Counseling Counseling given: Not Answered   Clinical Intake:  Pre-visit preparation completed: No  Pain : No/denies pain     BMI - recorded: 22.3 Nutritional Status: BMI of 19-24  Normal Nutritional Risks: None Diabetes: No  How often do you need to have someone help you when you read instructions, pamphlets, or other written materials from your doctor or pharmacy?: 1 - Never  Diabetic?  No  Interpreter Needed?: No  Information entered by :: [name] LPN   Activities of Daily Living    10/29/2022    1:24 PM  In your present state of health, do you have any difficulty performing the following activities:  Hearing? 0  Vision? 0  Difficulty  concentrating or making decisions? 0  Walking or climbing stairs? 0  Dressing or bathing? 0  Doing errands, shopping? 0  Preparing Food and eating ? N  Using the Toilet? N  In the past six months, have you accidently leaked urine? N  Do you have problems with loss of bowel control? N  Managing your Medications? N  Managing your Finances? N  Housekeeping or managing your Housekeeping? N    Patient Care Team: Eulas Post, MD as PCP - General (Family Medicine) Viona Gilmore, [facility] as Pharmacist (Pharmacist)  Indicate any recent Medical Services you may have received from other than [facility] providers in the past year (date may be approximate).     Assessment:   This is a routine wellness examination for [place].  Hearing/Vision screen Hearing Screening - Comments:: Denies hearing difficulties   Vision Screening - Comments:: - up to date with routine eye exams with  Dr Sherral Hammers  Dietary issues and exercise activities discussed: Exercise limited by: None identified   Goals Addressed               This Visit's Progress     No current goals (pt-stated)         Depression Screen    10/29/2022    1:23 PM 07/12/2022   11:53 AM 10/28/2021    1:03 PM 10/27/2020   10:49 AM 05/23/2018    2:24 PM 12/01/2016   10:37 AM 11/24/2015    9:24 AM  PHQ 2/9 Scores  PHQ - 2 Score 0 0 0 0 0 0 0  PHQ- 9 Score  0  0       Fall Risk    10/29/2022    1:24 PM 07/12/2022   11:54 AM 10/28/2021    1:05 PM 10/27/2020   10:48 AM 08/05/2020    3:33 PM  Fall Risk   Falls in the past year? 1 1 0 1 0  Number falls in past yr: 0 0 0 0 0  Injury with Fall? 1 1 0 0 0  Comment  Left finger fx. Followed by Medical attention      Risk for fall due to : No Fall Risks History of fall(s)  No Fall Risks   Follow up Falls prevention discussed Falls evaluation completed  Falls evaluation completed;Falls prevention discussed     FALL RISK PREVENTION PERTAINING TO THE HOME:  Any stairs in or around the home? Yes   If so, are there any without handrails? No  Home free of loose throw rugs in walkways, pet beds, electrical cords, etc? Yes  Adequate lighting in your home to reduce risk of falls? Yes   ASSISTIVE DEVICES UTILIZED TO PREVENT FALLS:  Life alert? No  Use of a cane, walker or w/c? No  Grab bars in the bathroom? No  Shower chair or bench in shower? No  Elevated toilet seat or a handicapped toilet? No   TIMED UP AND GO:  Was the test performed? No . Audio Visit  Cognitive Function:        10/29/2022    1:26 PM 10/28/2021    1:06 PM 10/27/2020   10:51 AM 12/01/2016   10:47 AM  6CIT Screen  What Year? 0 points 0 points 0 points 0 points  What month? 0 points 0 points 0 points 0 points  What time? 0 points 0 points 0 points 0 points  Count back from 20 0 points 0 points 0 points 0 points  Months in reverse 0 points 0 points 0 points 0 points  Repeat phrase 0 points 2 points 6 points 2 points  Total Score 0 points 2 points 6 points 2 points    Immunizations Immunization History  Administered Date(s) Administered   H1N1 10/02/2008   Influenza Split 09/30/2011, 10/10/2012   Influenza Whole 09/04/2007, 07/30/2008, 10/08/2009, 09/11/2010   Influenza, High Dose Seasonal PF 07/28/2017   Influenza,inj,Quad PF,6+ Mos 07/11/2013, 09/18/2018, 07/27/2019, 08/05/2020, 07/12/2022   Influenza-Unspecified 09/11/2014, 08/25/2016, 08/04/2020   PFIZER(Purple Top)SARS-COV-2 Vaccination 02/04/2020, 02/27/2020, 09/07/2020   Pneumococcal Conjugate-13 11/22/2014   Pneumococcal Polysaccharide-23 12/04/2007   Td 04/02/2008   Zoster, Live 11/26/2013    TDAP status: Due, Education has been provided regarding the importance of this vaccine. Advised may receive this vaccine at local pharmacy or Health Dept. Aware to provide a copy of the vaccination record if obtained from local pharmacy or Health Dept. Verbalized acceptance and understanding.  Flu Vaccine status: Up to date  Pneumococcal vaccine status:  Up to date  Covid-19 vaccine status: Completed vaccines  Qualifies for Shingles Vaccine? Yes   Zostavax completed No   Shingrix Completed?: No.    Education has been provided regarding the importance of this vaccine. Patient has been advised to call insurance company to determine out of pocket expense if they have not yet received this vaccine. Advised may also receive vaccine at local pharmacy or Health Dept. Verbalized acceptance and understanding.  Screening Tests Health Maintenance  Topic Date Due   DTaP/Tdap/Td (2 - Tdap) 04/02/2018   COVID-19 Vaccine (4 - 2023-24 season) 11/14/2022 (Originally 06/25/2022)   Zoster Vaccines- Shingrix (1 of 2) 01/28/2023 (Originally 02/20/1951)   Medicare Annual Wellness (AWV)  10/30/2023   Pneumonia Vaccine 64+ Years old  Completed   INFLUENZA VACCINE  Completed   DEXA SCAN  Completed   HPV VACCINES  Aged Out    Health Maintenance  Health Maintenance Due  Topic Date Due   DTaP/Tdap/Td (2 - Tdap) 04/02/2018    Colorectal cancer screening: No longer required.   Mammogram status: No longer required  due to Age.  Bone Density status: Completed 11/28/17. Results reflect: Bone density results: OSTEOPOROSIS. Repeat every   years.  Lung Cancer Screening: (Low Dose CT Chest recommended if Age 70-80 years, 30 pack-year currently smoking OR have quit w/in 15years.) does not qualify.     Additional Screening:  Hepatitis C Screening: does not qualify; Completed   Vision Screening: Recommended annual ophthalmology exams for early detection of glaucoma and other disorders of the eye. Is the patient up to date with their annual eye exam?  Yes  Who is the provider or what is the name of the office in which the patient attends annual eye exams? Dr Sherral Hammers If pt is not established with a provider, would they like to be referred to a provider to establish care? No .   Dental Screening: Recommended annual dental exams for proper oral hygiene  Community  Resource Referral / Chronic Care Management:  CRR required this visit?  No   CCM required this visit?  No      Plan:     I have personally reviewed and noted the following in the patient's chart:   Medical and social history Use of alcohol, tobacco or illicit drugs  Current medications and supplements including opioid prescriptions. Patient is not currently taking opioid prescriptions. Functional ability and status Nutritional status Physical activity Advanced directives List of other physicians Hospitalizations, surgeries, and ER visits in previous 12 months Vitals Screenings to include cognitive, depression, and falls Referrals and appointments  In addition, I have reviewed and discussed with patient certain preventive protocols, quality metrics, and best practice recommendations. A written personalized care plan for preventive services as well as general preventive health recommendations were provided to patient.     Criselda Peaches, LPN   0/06/3234   Nurse Notes: None

## 2022-10-29 NOTE — Patient Instructions (Addendum)
[name] , Thank you for taking time to come for your Medicare Wellness Visit. I appreciate your ongoing commitment to your health goals. Please review the following plan we discussed and let me know if I can assist you in the future.   These are the goals we discussed:  Goals       No current goals (pt-stated)      Patient Stated      To maintain the same plan.       stay independent      Keep exercising, stay engaged in activities and food banks Keep volunteering       Track and Manage My Blood Pressure-Hypertension      Timeframe:  Long-Range Goal Priority:  High Start Date:                             Expected End Date:                       Follow Up Date 03/14/22    - check blood pressure weekly - choose a place to take my blood pressure (home, clinic or office, retail store) - write blood pressure results in a log or diary    Why is this important?   You won't feel high blood pressure, but it can still hurt your blood vessels.  High blood pressure can cause heart or kidney problems. It can also cause a stroke.  Making lifestyle changes like losing a little weight or eating less salt will help.  Checking your blood pressure at home and at different times of the day can help to control blood pressure.  If the doctor prescribes medicine remember to take it the way the doctor ordered.  Call the office if you cannot afford the medicine or if there are questions about it.     Notes:         This is a list of the screening recommended for you and due dates:  Health Maintenance  Topic Date Due   DTaP/Tdap/Td vaccine (2 - Tdap) 04/02/2018   COVID-19 Vaccine (4 - 2023-24 season) 11/14/2022*   Zoster (Shingles) Vaccine (1 of 2) 01/28/2023*   Medicare Annual Wellness Visit  10/30/2023   Pneumonia Vaccine  Completed   Flu Shot  Completed   DEXA scan (bone density measurement)  Completed   HPV Vaccine  Aged Out  *Topic was postponed. The date shown is not the  original due date.    Advanced directives: Please bring a copy of your health care power of attorney and living will to the office to be added to your chart at your convenience.   Conditions/risks identified: None  Next appointment: Follow up in one year for your annual wellness visit     Preventive Care 65 Years and Older, Female Preventive care refers to lifestyle choices and visits with your health care provider that can promote health and wellness. What does preventive care include? A yearly physical exam. This is also called an annual well check. Dental exams once or twice a year. Routine eye exams. Ask your health care provider how often you should have your eyes checked. Personal lifestyle choices, including: Daily care of your teeth and gums. Regular physical activity. Eating a healthy diet. Avoiding tobacco and drug use. Limiting alcohol use. Practicing safe sex. Taking low-dose aspirin every day. Taking vitamin and mineral supplements as recommended by your health care provider.  What happens during an annual well check? The services and screenings done by your health care provider during your annual well check will depend on your age, overall health, lifestyle risk factors, and family history of disease. Counseling  Your health care provider may ask you questions about your: Alcohol use. Tobacco use. Drug use. Emotional well-being. Home and relationship well-being. Sexual activity. Eating habits. History of falls. Memory and ability to understand (cognition). Work and work Statistician. Reproductive health. Screening  You may have the following tests or measurements: Height, weight, and BMI. Blood pressure. Lipid and cholesterol levels. These may be checked every 5 years, or more frequently if you are over 84 years old. Skin check. Lung cancer screening. You may have this screening every year starting at age 69 if you have a 30-pack-year history of smoking and  currently smoke or have quit within the past 15 years. Fecal occult blood test (FOBT) of the stool. You may have this test every year starting at age 18. Flexible sigmoidoscopy or colonoscopy. You may have a sigmoidoscopy every 5 years or a colonoscopy every 10 years starting at age 49. Hepatitis C blood test. Hepatitis B blood test. Sexually transmitted disease (STD) testing. Diabetes screening. This is done by checking your blood sugar (glucose) after you have not eaten for a while (fasting). You may have this done every 1-3 years. Bone density scan. This is done to screen for osteoporosis. You may have this done starting at age 34. Mammogram. This may be done every 1-2 years. Talk to your health care provider about how often you should have regular mammograms. Talk with your health care provider about your test results, treatment options, and if necessary, the need for more tests. Vaccines  Your health care provider may recommend certain vaccines, such as: Influenza vaccine. This is recommended every year. Tetanus, diphtheria, and acellular pertussis (Tdap, Td) vaccine. You may need a Td booster every 10 years. Zoster vaccine. You may need this after age 64. Pneumococcal 13-valent conjugate (PCV13) vaccine. One dose is recommended after age 56. Pneumococcal polysaccharide (PPSV23) vaccine. One dose is recommended after age 71. Talk to your health care provider about which screenings and vaccines you need and how often you need them. This information is not intended to replace advice given to you by your health care provider. Make sure you discuss any questions you have with your health care provider. Document Released: 11/07/2015 Document Revised: 06/30/2016 Document Reviewed: 08/12/2015 Elsevier Interactive Patient Education  2017 [place] Prevention in the Home Falls can cause injuries. They can happen to people of all ages. There are many things you can do to make your home  safe and to help prevent falls. What can I do on the outside of my home? Regularly fix the edges of walkways and driveways and fix any cracks. Remove anything that might make you trip as you walk through a door, such as a raised step or threshold. Trim any bushes or trees on the path to your home. Use bright outdoor lighting. Clear any walking paths of anything that might make someone trip, such as rocks or tools. Regularly check to see if handrails are loose or broken. Make sure that both sides of any steps have handrails. Any raised decks and porches should have guardrails on the edges. Have any leaves, snow, or ice cleared regularly. Use sand or salt on walking paths during winter. Clean up any spills in your garage right away. This includes oil or grease  spills. What can I do in the bathroom? Use night lights. Install grab bars by the toilet and in the tub and shower. Do not use towel bars as grab bars. Use non-skid mats or decals in the tub or shower. If you need to sit down in the shower, use a plastic, non-slip stool. Keep the floor dry. Clean up any water that spills on the floor as soon as it happens. Remove soap buildup in the tub or shower regularly. Attach bath mats securely with double-sided non-slip rug tape. Do not have throw rugs and other things on the floor that can make you trip. What can I do in the bedroom? Use night lights. Make sure that you have a light by your bed that is easy to reach. Do not use any sheets or blankets that are too big for your bed. They should not hang down onto the floor. Have a firm chair that has side arms. You can use this for support while you get dressed. Do not have throw rugs and other things on the floor that can make you trip. What can I do in the kitchen? Clean up any spills right away. Avoid walking on wet floors. Keep items that you use a lot in easy-to-reach places. If you need to reach something above you, use a strong step  stool that has a grab bar. Keep electrical cords out of the way. Do not use floor polish or wax that makes floors slippery. If you must use wax, use non-skid floor wax. Do not have throw rugs and other things on the floor that can make you trip. What can I do with my stairs? Do not leave any items on the stairs. Make sure that there are handrails on both sides of the stairs and use them. Fix handrails that are broken or loose. Make sure that handrails are as long as the stairways. Check any carpeting to make sure that it is firmly attached to the stairs. Fix any carpet that is loose or worn. Avoid having throw rugs at the top or bottom of the stairs. If you do have throw rugs, attach them to the floor with carpet tape. Make sure that you have a light switch at the top of the stairs and the bottom of the stairs. If you do not have them, ask someone to add them for you. What else can I do to help prevent falls? Wear shoes that: Do not have high heels. Have rubber bottoms. Are comfortable and fit you well. Are closed at the toe. Do not wear sandals. If you use a stepladder: Make sure that it is fully opened. Do not climb a closed stepladder. Make sure that both sides of the stepladder are locked into place. Ask someone to hold it for you, if possible. Clearly mark and make sure that you can see: Any grab bars or handrails. First and last steps. Where the edge of each step is. Use tools that help you move around (mobility aids) if they are needed. These include: Canes. Walkers. Scooters. Crutches. Turn on the lights when you go into a dark area. Replace any light bulbs as soon as they burn out. Set up your furniture so you have a clear path. Avoid moving your furniture around. If any of your floors are uneven, fix them. If there are any pets around you, be aware of where they are. Review your medicines with your doctor. Some medicines can make you feel dizzy. This can increase your  chance of falling. Ask your doctor what other things that you can do to help prevent falls. This information is not intended to replace advice given to you by your health care provider. Make sure you discuss any questions you have with your health care provider. Document Released: 08/07/2009 Document Revised: 03/18/2016 Document Reviewed: 11/15/2014 Elsevier Interactive Patient Education  2017 [employer].

## 2022-11-04 ENCOUNTER — Telehealth: Payer: Self-pay | Admitting: Family Medicine

## 2022-11-04 ENCOUNTER — Telehealth: Payer: Self-pay | Admitting: *Deleted

## 2022-11-04 NOTE — Patient Outreach (Signed)
  Care Coordination   11/04/2022 Name: [name] MRN: [id number] DOB: 04-17-1932   Care Coordination Outreach Attempts:  An unsuccessful telephone outreach was attempted today to offer the patient information about available care coordination services as a benefit of their health plan.   Follow Up Plan:  No further outreach attempts will be made at this time. We have been unable to contact the patient to offer or enroll patient in care coordination services  Encounter Outcome:  No Answer  Records indicates pt currently resides at nursing home  Care Coordination Interventions:  No, not indicated    Raina Mina, RN Care Management Coordinator [facility] Office [phone]

## 2022-11-04 NOTE — Telephone Encounter (Signed)
Medical Necessity form and Prescription to be filled out--placed in dr's folder.  Call pt upon completion.

## 2022-11-05 NOTE — Telephone Encounter (Signed)
Letter mailed as requested.

## 2022-11-05 NOTE — Telephone Encounter (Signed)
Patient received a call regarding form completion and states she provided an envelope for them to be mailed back to her.

## 2022-11-11 NOTE — Telephone Encounter (Signed)
Pt did not get new rx for 2024Vitamin D, Ergocalciferol, (DRISDOL) 1.25 MG (50000 UNIT) CAPS capsule , Omega-3 Fatty Acids (FISH OIL) 1000 MG CAPS , Glucosamine-Chondroitin 250-200 MG TABS , Calcium-Vitamin D-Vitamin K (VIACTIV CALCIUM PLUS D) 650-12.5-40 MG-MCG-MCG CHEW . Please mail's rx to pt . Pt address has been verified

## 2022-11-15 MED ORDER — VIACTIV CALCIUM PLUS D 650-12.5-40 MG-MCG PO CHEW: CHEWABLE_TABLET | ORAL | 3 refills | Status: DC

## 2022-11-15 MED ORDER — VITAMIN D (ERGOCALCIFEROL) 1.25 MG (50000 UNIT) PO CAPS: 50000.0000 [IU] | ORAL_CAPSULE | ORAL | 4 refills | Status: DC

## 2022-11-15 MED ORDER — FISH OIL 1000 MG PO CAPS: 2000.0000 mg | ORAL_CAPSULE | Freq: Two times a day (BID) | ORAL | 3 refills | Status: DC

## 2022-11-15 MED ORDER — GLUCOSAMINE-CHONDROITIN 250-200 MG PO TABS: 1.0000 | ORAL_TABLET | Freq: Every day | ORAL | 3 refills | Status: DC

## 2022-11-15 NOTE — Telephone Encounter (Signed)
Prescription printed and placed in folder for signature

## 2022-11-15 NOTE — Addendum Note (Signed)
Addended by: Nilda Riggs on: 11/15/2022 09:32 AM   Modules accepted: Orders

## 2022-12-03 ENCOUNTER — Telehealth: Payer: Self-pay

## 2022-12-03 NOTE — Progress Notes (Signed)
Care Management & Coordination Services Pharmacy Team  Reason for Encounter: Hypertension  Contacted patient to discuss hypertension disease state. Spoke with patient on 12/03/2022   Current antihypertensive regimen:  Ziac 5/6.25 daily Patient verbally confirms she is taking the above medications as directed. Yes  How often are you checking your Blood Pressure? Patient states she is not checking blood pressures at home, her readings are always good, she will check if she doesn't feel well which is very infrequent.   Wrist or arm cuff: Wrist  OTC medications including pseudoephedrine or NSAIDs? Patient denies  Any readings above 180/100? Patient denies  What recent interventions/DTPs have been made by any provider to improve Blood Pressure control since last CPP Visit: No recent interventions noted  Any recent hospitalizations or ED visits since last visit with CPP? No recent hospital visits.   What diet changes have been made to improve Blood Pressure Control?  Diet followed - patient tries to make healthy choices Breakfast - patient will have eggs with tomato or cereal Lunch - patient will have a sandwich Dinner - patient will have a meal containing a meat and vegetable.   Caffeine intake - patient will have 1 cup in the morning and occasional at lunch Salt intake - patient uses salt very little  What exercise is being done to improve your Blood Pressure Control?  Patient walks daily   Adherence Review: Is the patient currently on ACE/ARB medication? No Does the patient have >5 day gap between last estimated fill dates? No  Star Rating Drug: None  Chart Updates: Recent office visits:  10/29/2022 [name] LPN - Encounter for Medicare annual wellness exam   07/12/2022 [name] MD - Patient was seen for dysuria and additional concerns. Started Keflex 500 mg tid.   Recent consult visits:  07/08/2022 Charlotte Crumb MD (ortho) - Patient was seen for Pain of  finger of left hand and additional concerns. No medication changes.   06/15/2022 07/08/2022 Charlotte Crumb MD (ortho) - Patient was seen for Pain of finger of left hand and additional concerns.No additional chart notes.   Hospital visits:  None  Medications: Outpatient Encounter Medications as of 12/03/2022  Medication Sig Note   bisoprolol-hydrochlorothiazide (ZIAC) 5-6.25 MG tablet TAKE 1 TABLET BY MOUTH DAILY.    Calcium-Vitamin D-Vitamin K (VIACTIV CALCIUM PLUS D) 650-12.5-40 MG-MCG-MCG CHEW As directed    cephALEXin (KEFLEX) 500 MG capsule Take 1 capsule (500 mg total) by mouth 3 (three) times daily.    feeding supplement (BOOST / RESOURCE BREEZE) LIQD Take 1 Container by mouth 2 (two) times daily. 11/28/2020: 0900/2100   Glucosamine-Chondroitin 250-200 MG TABS Take 1 tablet by mouth daily.    hydrocortisone (ANUSOL-HC) 2.5 % rectal cream Place 1 application. rectally 2 (two) times daily.    hydrocortisone-pramoxine (ANALPRAM HC) 2.5-1 % rectal cream Place 1 application. rectally 3 (three) times daily.    Multiple Vitamin (MULTIVITAMIN WITH MINERALS) TABS tablet Take 1 tablet by mouth daily.    ofloxacin (OCUFLOX) 0.3 % ophthalmic solution     Omega-3 Fatty Acids (FISH OIL) 1000 MG CAPS Take 2 capsules (2,000 mg total) by mouth 2 (two) times daily.    prednisoLONE acetate (PRED FORTE) 1 % ophthalmic suspension SMARTSIG:In Eye(s)    traMADol (ULTRAM) 50 MG tablet Take 50 mg by mouth every 6 (six) hours as needed. (Patient not taking: Reported on 07/12/2022)    Vitamin D, Ergocalciferol, (DRISDOL) 1.25 MG (50000 UNIT) CAPS capsule Take 1 capsule (50,000 Units total) by  mouth every 7 (seven) days.    No facility-administered encounter medications on file as of 12/03/2022.  Fill History:   Dispensed Days Supply Quantity Provider Pharmacy  BISOPROLOL-HCTZ 5-6.25 MG TAB 09/23/2022 90 90 each      Dispensed Days Supply Quantity Provider Pharmacy  HYDROCORTISONE 2.5% CREAM 02/10/2022 20 30 g       Recent Office Vitals: BP Readings from Last 3 Encounters:  07/12/22 (!) 146/78  02/08/22 (!) 148/68  12/21/21 (!) 160/78   Pulse Readings from Last 3 Encounters:  07/12/22 78  02/08/22 82  12/21/21 69    Wt Readings from Last 3 Encounters:  10/29/22 118 lb (53.5 kg)  07/12/22 118 lb (53.5 kg)  02/08/22 119 lb 4.8 oz (54.1 kg)     Kidney Function Lab Results  Component Value Date/Time   CREATININE 0.80 12/21/2021 10:05 AM   CREATININE 0.56 11/28/2020 06:00 AM   CREATININE 0.77 08/05/2020 03:59 PM   GFR 66.06 12/21/2021 10:05 AM   GFRNONAA >60 11/28/2020 06:00 AM   GFRAA 105 09/04/2007 10:38 AM       Latest Ref Rng & Units 12/21/2021   10:05 AM 11/28/2020    6:00 AM 11/20/2020    1:29 AM  BMP  Glucose 70 - 99 mg/dL 93  99  111   BUN 6 - 23 mg/dL 22  26  17   $ Creatinine 0.40 - 1.20 mg/dL 0.80  0.56  0.58   Sodium 135 - 145 mEq/L 135  138  135   Potassium 3.5 - 5.1 mEq/L 4.1  3.7  4.4   Chloride 96 - 112 mEq/L 97  103  103   CO2 19 - 32 mEq/L 31  21  26   $ Calcium 8.4 - 10.5 mg/dL 10.4  8.5  7.3    [place] Pharmacist Assistant [phone]

## 2022-12-20 ENCOUNTER — Encounter: Payer: Self-pay | Admitting: Family Medicine

## 2022-12-20 ENCOUNTER — Ambulatory Visit (INDEPENDENT_AMBULATORY_CARE_PROVIDER_SITE_OTHER): Payer: Medicare Other | Admitting: Family Medicine

## 2022-12-20 VITALS — BP 120/80 | HR 100 | Temp 98.5°F | Resp 16 | Ht 61.0 in | Wt 117.5 lb

## 2022-12-20 DIAGNOSIS — M542 Cervicalgia: Secondary | ICD-10-CM | POA: Diagnosis not present

## 2022-12-20 DIAGNOSIS — E559 Vitamin D deficiency, unspecified: Secondary | ICD-10-CM

## 2022-12-20 DIAGNOSIS — R519 Headache, unspecified: Secondary | ICD-10-CM

## 2022-12-20 LAB — BASIC METABOLIC PANEL
BUN: 29 mg/dL — ABNORMAL HIGH (ref 6–23)
CO2: 27 mEq/L (ref 19–32)
Calcium: 10.6 mg/dL — ABNORMAL HIGH (ref 8.4–10.5)
Chloride: 101 mEq/L (ref 96–112)
Creatinine, Ser: 0.8 mg/dL (ref 0.40–1.20)
GFR: 65.6 mL/min (ref >60.00)
Glucose, Bld: 87 mg/dL (ref 70–99)
Potassium: 4.6 mEq/L (ref 3.5–5.1)
Sodium: 139 mEq/L (ref 135–145)

## 2022-12-20 LAB — CBC
HCT: 33.2 % — ABNORMAL LOW (ref 36.0–46.0)
Hemoglobin: 11.3 g/dL — ABNORMAL LOW (ref 12.0–15.0)
MCHC: 34 g/dL (ref 30.0–36.0)
MCV: 92.1 fl (ref 78.0–100.0)
Platelets: 273 10*3/uL (ref 150.0–400.0)
RBC: 3.61 Mil/uL — ABNORMAL LOW (ref 3.87–5.11)
RDW: 12.1 % (ref 11.5–15.5)
WBC: 10 10*3/uL (ref 4.0–10.5)

## 2022-12-20 LAB — C-REACTIVE PROTEIN: CRP: 3.5 mg/dL (ref 0.5–20.0)

## 2022-12-20 LAB — VITAMIN D 25 HYDROXY (VIT D DEFICIENCY, FRACTURES): VITD: 82.08 ng/mL (ref 30.00–100.00)

## 2022-12-20 MED ORDER — TIZANIDINE HCL 4 MG PO TABS: 2.0000 mg | ORAL_TABLET | Freq: Every day | ORAL | 0 refills | Status: AC

## 2022-12-20 NOTE — Patient Instructions (Addendum)
A few things to remember from today's visit:  Headache, unspecified headache type - Plan: Basic metabolic panel, CBC, C-reactive protein  Neck pain - Plan: tiZANidine (ZANAFLEX) 4 MG tablet  Vitamin D deficiency - Plan: Basic metabolic panel, VITAMIN D 25 Hydroxy (Vit-D Deficiency, Fractures)  Headache could be related to neck pain. Local massage and topical icy hot or asper cream on side of neck that hurts. Zanaflex 1/2-1 tab at bedtime for 10-14 days then as needed. Tylenol [phone] mg at bedtime if needed. Monitor for new symptoms.  If you need refills for medications you take chronically, please call your pharmacy. Do not use My Chart to request refills or for acute issues that need immediate attention. If you send a my chart message, it may take a few days to be addressed, specially if I am not in the office.  Please be sure medication list is accurate. If a new problem present, please set up appointment sooner than planned today.

## 2022-12-20 NOTE — Progress Notes (Unsigned)
ACUTE VISIT Chief Complaint  Patient presents with   Headache    X 4 days, unable to get it to go away.   HPI: [name] is a 89 y.o. female with PMHx significant for HTN, OA, HLD, and IBS here today complaining of constant, aching headache that has persisted for four days.  She is also having right-sided neck pain. She reports that the pain is worse in the morning and sometimes during the night. The patient has a history of headaches dating back to 1920, which was attributed to make away and treated with cervical surgery.  She denies any associated visual changes, difficulty swallowing, or vomiting but does report occasional nausea.  Headache  This is a new problem. The current episode started in the past 7 days. The problem occurs constantly. The problem has been unchanged. The pain is located in the Right unilateral, frontal, occipital and parietal region. The pain does not radiate. The pain quality is not similar to prior headaches. The quality of the pain is described as aching. The pain is at a severity of 5/10. The pain is moderate. Associated symptoms include drainage, ear pain, neck pain, rhinorrhea and scalp tenderness. Pertinent negatives include no abdominal pain, abnormal behavior, anorexia, blurred vision, coughing, dizziness, eye pain, eye redness, eye watering, facial sweating, fever, hearing loss, loss of balance, numbness, phonophobia, photophobia, seizures, sinus pressure, sore throat, swollen glands, tingling, tinnitus, visual change, vomiting, weakness or weight loss. Nothing aggravates the symptoms. She has tried NSAIDs and acetaminophen for the symptoms. The treatment provided moderate relief. Her past medical history is significant for hypertension and TMJ.  She has noticed pain in her left TMJ when yawning, which has caused a popping sensation. She also mentions the possibility of a sinus infection due to rhinorrhea and nasal congestion.   She reports  feeling fatigued and waking up multiple times during the night to urinate, which is not uncommon for her.   Lab Results  Component Value Date   WBC 8.1 12/21/2021   HGB 12.1 12/21/2021   HCT 36.5 12/21/2021   MCV 93.8 12/21/2021   PLT 178.0 12/21/2021   Lab Results  Component Value Date   CREATININE 0.80 12/21/2021   BUN 22 12/21/2021   NA 135 12/21/2021   K 4.1 12/21/2021   CL 97 12/21/2021   CO2 31 12/21/2021   Last 25 OH vitamin D done in 11/2021 was 96.  Currently she is on ergocalciferol 50,000 units weekly.  Review of Systems  Constitutional:  Negative for fever and weight loss.  HENT:  Positive for ear pain and rhinorrhea. Negative for hearing loss, sinus pressure, sore throat and tinnitus.   Eyes:  Negative for blurred vision, photophobia, pain and redness.  Respiratory:  Negative for cough.   Gastrointestinal:  Negative for abdominal pain, anorexia and vomiting.  Musculoskeletal:  Positive for neck pain.  Neurological:  Positive for headaches. Negative for dizziness, tingling, seizures, weakness, numbness and loss of balance.  See other pertinent positives and negatives in HPI.  Current Outpatient Medications on File Prior to Visit  Medication Sig Dispense Refill   bisoprolol-hydrochlorothiazide (ZIAC) 5-6.25 MG tablet TAKE 1 TABLET BY MOUTH DAILY. 90 tablet 3   Calcium-Vitamin D-Vitamin K (VIACTIV CALCIUM PLUS D) 650-12.5-40 MG-MCG-MCG CHEW As directed 90 tablet 3   cephALEXin (KEFLEX) 500 MG capsule Take 1 capsule (500 mg total) by mouth 3 (three) times daily. 21 capsule 0   feeding supplement (BOOST / RESOURCE BREEZE) LIQD Take 1  Container by mouth 2 (two) times daily.     Glucosamine-Chondroitin 250-200 MG TABS Take 1 tablet by mouth daily. 90 tablet 3   hydrocortisone (ANUSOL-HC) 2.5 % rectal cream Place 1 application. rectally 2 (two) times daily. 30 g 0   hydrocortisone-pramoxine (ANALPRAM HC) 2.5-1 % rectal cream Place 1 application. rectally 3 (three) times  daily. 30 g 2   Multiple Vitamin (MULTIVITAMIN WITH MINERALS) TABS tablet Take 1 tablet by mouth daily.     ofloxacin (OCUFLOX) 0.3 % ophthalmic solution      Omega-3 Fatty Acids (FISH OIL) 1000 MG CAPS Take 2 capsules (2,000 mg total) by mouth 2 (two) times daily. 360 capsule 3   prednisoLONE acetate (PRED FORTE) 1 % ophthalmic suspension SMARTSIG:In Eye(s)     traMADol (ULTRAM) 50 MG tablet Take 50 mg by mouth every 6 (six) hours as needed.     Vitamin D, Ergocalciferol, (DRISDOL) 1.25 MG (50000 UNIT) CAPS capsule Take 1 capsule (50,000 Units total) by mouth every 7 (seven) days. 12 capsule 4   No current facility-administered medications on file prior to visit.    Past Medical History:  Diagnosis Date   Constipation    Diverticulosis    Hypertension    Inguinal hernia, left    Irritable bowel syndrome    Osteoporosis    UTI (lower urinary tract infection)    hx of frequent uti's   Allergies  Allergen Reactions   Codeine     REACTION: headaches   Flagyl [Metronidazole Hcl]     nausea    Social History   Socioeconomic History   Marital status: Married    Spouse name: Not on file   Number of children: Not on file   Years of education: Not on file   Highest education level: Not on file  Occupational History   Occupation: retired    Fish farm manager: RETIRED  Tobacco Use   Smoking status: Never   Smokeless tobacco: Never  Vaping Use   Vaping Use: Never used  Substance and Sexual Activity   Alcohol use: No   Drug use: No   Sexual activity: Not Currently  Other Topics Concern   Not on file  Social History Narrative   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   Married x 45 years   1 child   2 grand and 1.5 great grands    Social Determinants of Health   Financial Resource Strain: Low Risk  (10/29/2022)   Overall Financial Resource Strain (CARDIA)    Difficulty of Paying Living Expenses: Not hard at all  Food Insecurity: No Food Insecurity (10/29/2022)   Hunger Vital Sign     Worried About Running Out of Food in the Last Year: Never true    Ran Out of Food in the Last Year: Never true  Transportation Needs: No Transportation Needs (10/29/2022)   PRAPARE - Hydrologist (Medical): No    Lack of Transportation (Non-Medical): No  Physical Activity: Insufficiently Active (10/29/2022)   Exercise Vital Sign    Days of Exercise per Week: 7 days    Minutes of Exercise per Session: 20 min  Stress: No Stress Concern Present (10/29/2022)   Loving    Feeling of Stress : Not at all  Social Connections: Coy (10/29/2022)   Social Connection and Isolation Panel [NHANES]    Frequency of Communication with Friends and Family: More than three times a week  Frequency of Social Gatherings with Friends and Family: More than three times a week    Attends Religious Services: More than 4 times per year    Active Member of Clubs or Organizations: Yes    Attends Music therapist: More than 4 times per year    Marital Status: Married    Vitals:   12/20/22 1015  BP: 120/80  Pulse: 100  Resp: 16  Temp: 98.5 F (36.9 C)  SpO2: 95%   Body mass index is 22.2 kg/m.  Physical Exam Vitals and nursing note reviewed.  Constitutional:      General: She is not in acute distress.    Appearance: She is well-developed.  HENT:     Head: Normocephalic and atraumatic.     Right Ear: Tympanic membrane, ear canal and external ear normal.     Left Ear: Tympanic membrane, ear canal and external ear normal.     Nose: Septal deviation and rhinorrhea present.     Right Sinus: No maxillary sinus tenderness or frontal sinus tenderness.     Left Sinus: No maxillary sinus tenderness or frontal sinus tenderness.     Mouth/Throat:     Mouth: Mucous membranes are moist.     Pharynx: Oropharynx is clear.  Eyes:     Conjunctiva/sclera: Conjunctivae normal.  Cardiovascular:      Rate and Rhythm: Normal rate and regular rhythm.     Heart sounds: No murmur heard. Pulmonary:     Effort: Pulmonary effort is normal. No respiratory distress.     Breath sounds: Normal breath sounds.  Abdominal:     Palpations: Abdomen is soft. There is no mass.     Tenderness: There is no abdominal tenderness.  Musculoskeletal:     Cervical back: No edema or erythema. Spinous process tenderness present. Decreased range of motion.       Back:  Lymphadenopathy:     Cervical: No cervical adenopathy.  Skin:    General: Skin is warm.     Findings: No erythema or rash.  Neurological:     General: No focal deficit present.     Mental Status: She is alert and oriented to person, place, and time.     Cranial Nerves: No cranial nerve deficit.     Gait: Gait normal.  Psychiatric:        Mood and Affect: Affect normal. Mood is anxious.   ASSESSMENT AND PLAN:  Ms. Nuhfer was seen today for headaches.  Headache, unspecified headache type Hx and examination today do not suggest a serious process. ?Tension like headache. Tylenol [phone] mg daily as needed. I do nor think imaging is needed today. Instructed about warning signs.  -     Basic metabolic panel; Future -     CBC; Future -     C-reactive protein; Future  Neck pain *** -     tiZANidine HCl; Take 0.5-1 tablets (2-4 mg total) by mouth at bedtime.  Dispense: 30 tablet; Refill: 0  Vitamin D deficiency Last 2 OH vit D on normal high range. Continue Ergocalciferol 50,000 U weekly. Further recommendations will be given according to 25 OH vit D result.  -     Basic metabolic panel; Future -     VITAMIN D 25 Hydroxy (Vit-D Deficiency, Fractures); Future    Return in about 4 weeks (around 01/17/2023) for Headache with PCP.  Alexes Lamarque G. Martin[place], MD  [facility]. [place] office.

## 2022-12-27 ENCOUNTER — Ambulatory Visit (INDEPENDENT_AMBULATORY_CARE_PROVIDER_SITE_OTHER): Payer: Medicare Other

## 2022-12-27 DIAGNOSIS — M818 Other osteoporosis without current pathological fracture: Secondary | ICD-10-CM

## 2022-12-27 MED ORDER — DENOSUMAB 60 MG/ML ~~LOC~~ SOSY
60.0000 mg | PREFILLED_SYRINGE | Freq: Once | SUBCUTANEOUS | Status: AC
  Administered 2022-12-27: 60 mg via SUBCUTANEOUS

## 2022-12-27 NOTE — Progress Notes (Signed)
Per orders of Dr. Elease Hashimoto, injection of prolia '60mg'$ /ml given by Encarnacion Slates on R arm.  Patient tolerated injection well.

## 2023-01-07 ENCOUNTER — Other Ambulatory Visit: Payer: Self-pay | Admitting: Family Medicine

## 2023-01-14 ENCOUNTER — Encounter: Payer: Self-pay | Admitting: Family Medicine

## 2023-01-14 ENCOUNTER — Ambulatory Visit (INDEPENDENT_AMBULATORY_CARE_PROVIDER_SITE_OTHER): Payer: Medicare Other | Admitting: Family Medicine

## 2023-01-14 VITALS — BP 142/72 | HR 85 | Ht 61.0 in | Wt 119.4 lb

## 2023-01-14 DIAGNOSIS — R059 Cough, unspecified: Secondary | ICD-10-CM

## 2023-01-14 DIAGNOSIS — J019 Acute sinusitis, unspecified: Secondary | ICD-10-CM

## 2023-01-14 MED ORDER — AMOXICILLIN-POT CLAVULANATE 875-125 MG PO TABS: 1.0000 | ORAL_TABLET | Freq: Two times a day (BID) | ORAL | 0 refills | Status: DC

## 2023-01-14 NOTE — Progress Notes (Signed)
Established Patient Office Visit  Subjective   Patient ID: [name], female    DOB: 07-Aug-1932  Age: 89 y.o. MRN: [id number]  Chief Complaint  Patient presents with   Headache    Follow up headaches   Cough    Cough, drainage, sore throat x 1 week    HPI   [name] is seen today for acute issue of cough, postnasal drainage, facial pressure, and ongoing laryngitis symptoms.  Refer to last note for details.  She was seen with headache.  She was prescribed Zanaflex but only took about 1 dose.  She is not taking any regular Tylenol.  She had C-reactive protein which was normal.  Her calcium came back just slightly high but no correction for albumin.  She was advised not to take extra calcium more than 1000 mg daily.  She had mild normocytic anemia which looks to be stable compared with prior labs.  Her main issues today are productive cough with greenish sputum.  Headaches are better.  Laryngitis is some better but not resolved.  Some sinus pressure.  Using over-the-counter Mucinex.  No fever.  No dyspnea.  Does have some sore throat which she thinks is due to postnasal drainage  Past Medical History:  Diagnosis Date   Constipation    Diverticulosis    Hypertension    Inguinal hernia, left    Irritable bowel syndrome    Osteoporosis    UTI (lower urinary tract infection)    hx of frequent uti's   Past Surgical History:  Procedure Laterality Date   APPENDECTOMY     BREAST SURGERY Left    biopsy   CERVICAL FUSION     colon polyps     FINGER SURGERY     HEMORRHOID SURGERY     HIP SURGERY Right    INGUINAL HERNIA REPAIR Left 01/13/2017   Procedure: LEFT INGUINAL HERNIA REPAIR WITH MESH;  Surgeon: Jackolyn Confer, MD;  Location: [facility];  Service: General;  Laterality: Left;   INSERTION OF MESH Left 01/13/2017   Procedure: INSERTION OF MESH;  Surgeon: Jackolyn Confer, MD;  Location[facility];  Service: General;  Laterality:  Left;   LUMBAR FUSION     LUMBAR LAMINECTOMY     partial resection of colon for tumor     SHOULDER SURGERY Left    TOTAL HIP ARTHROPLASTY     right   TOTAL HIP REVISION Right 11/17/2020   Procedure: ORIF of Right periprosthetic fracture;  Surgeon: Paralee Cancel, MD;  Location: [facility];  Service: Orthopedics;  Laterality: Right;    reports that she has never smoked. She has never used smokeless tobacco. She reports that she does not drink alcohol and does not use drugs. family history includes Breast cancer in her sister; Cancer in her father; Colon cancer in her mother; Heart disease in her brother; Liver cancer in her brother; Lung cancer in her father; Ovarian cancer in her sister. Allergies  Allergen Reactions   Codeine     REACTION: headaches   Flagyl [Metronidazole Hcl]     nausea    Review of Systems  Constitutional:  Negative for chills and fever.  HENT:  Positive for congestion, sinus pain and sore throat.   Respiratory:  Positive for cough and sputum production. Negative for shortness of breath and wheezing.       Objective:     BP (!) 142/72   Pulse 85   Ht 5\' 1"  (1.549  m)   Wt 119 lb 6.4 oz (54.2 kg)   SpO2 96%   BMI 22.56 kg/m  BP Readings from Last 3 Encounters:  01/14/23 (!) 142/72  12/20/22 120/80  07/12/22 (!) 146/78   Wt Readings from Last 3 Encounters:  01/14/23 119 lb 6.4 oz (54.2 kg)  12/20/22 117 lb 8 oz (53.3 kg)  10/29/22 118 lb (53.5 kg)      Physical Exam Vitals reviewed.  Constitutional:      General: She is not in acute distress.    Appearance: She is not ill-appearing or toxic-appearing.  HENT:     Mouth/Throat:     Mouth: Mucous membranes are moist.     Pharynx: Oropharynx is clear.  Cardiovascular:     Rate and Rhythm: Normal rate and regular rhythm.  Pulmonary:     Effort: Pulmonary effort is normal.     Breath sounds: Normal breath sounds. No wheezing or rales.  Musculoskeletal:     Cervical back: Neck supple.   Neurological:     Mental Status: She is alert.      No results found for any visits on 01/14/23.    The ASCVD Risk score (Arnett DK, et al., 2019) failed to calculate for the following reasons:   The 2019 ASCVD risk score is only valid for ages 2 to 25    Assessment & Plan:   Patient presents with over 1 week history of productive cough with sinusitis symptoms and laryngitis.  -Continue over-the-counter Mucinex -Continue good hydration -We explained that most sinusitis is viral.  However, she has had some progressive symptoms.  Will start Augmentin 875 mg twice daily for 7 days -Touch base if laryngitis symptoms not resolving over the next 1 to 2 weeks   [name], MD

## 2023-06-07 ENCOUNTER — Telehealth: Payer: Self-pay

## 2023-06-07 NOTE — Telephone Encounter (Signed)
Pt ready for scheduling on or after 9/4.  Estimated out-of-pocket cost due at time of visit: $0  Primary Insurance: Medicare Prolia co-insurance: 0%  Secondary Insurance: UHC supplement  Deductible: $240 of $240 met.  Eligible for co-pay program: No  Prior Auth: N/A PA#:  Valid:   This summary of benefits is an estimation of the patient's out-of-pocket cost. Exact cost may vary based on individual plan coverage.

## 2023-06-29 NOTE — Telephone Encounter (Signed)
I called and left patient a voicemail to call the office back to schedule her Prolia.

## 2023-06-30 ENCOUNTER — Ambulatory Visit (INDEPENDENT_AMBULATORY_CARE_PROVIDER_SITE_OTHER): Payer: Medicare Other | Admitting: *Deleted

## 2023-06-30 DIAGNOSIS — M818 Other osteoporosis without current pathological fracture: Secondary | ICD-10-CM | POA: Diagnosis not present

## 2023-06-30 MED ORDER — DENOSUMAB 60 MG/ML ~~LOC~~ SOSY
60.0000 mg | PREFILLED_SYRINGE | Freq: Once | SUBCUTANEOUS | Status: AC
  Administered 2023-06-30: 60 mg via SUBCUTANEOUS

## 2023-06-30 NOTE — Telephone Encounter (Signed)
Pt will be here this morning at 10:30 am, for her [employer].

## 2023-06-30 NOTE — Progress Notes (Signed)
Per orders of Dr. Casimiro Needle, injection of Prolia 60mg  given by Johnella Moloney. Patient tolerated injection well.

## 2023-06-30 NOTE — Telephone Encounter (Signed)
Injection administered today during nursing visit.

## 2023-08-13 ENCOUNTER — Other Ambulatory Visit: Payer: Self-pay | Admitting: Family Medicine

## 2023-09-28 DIAGNOSIS — C4490 Unspecified malignant neoplasm of skin, unspecified: Secondary | ICD-10-CM | POA: Diagnosis not present

## 2023-09-28 DIAGNOSIS — L309 Dermatitis, unspecified: Secondary | ICD-10-CM | POA: Diagnosis not present

## 2023-09-28 DIAGNOSIS — L821 Other seborrheic keratosis: Secondary | ICD-10-CM | POA: Diagnosis not present

## 2023-09-28 DIAGNOSIS — D225 Melanocytic nevi of trunk: Secondary | ICD-10-CM | POA: Diagnosis not present

## 2023-09-28 DIAGNOSIS — L282 Other prurigo: Secondary | ICD-10-CM | POA: Diagnosis not present

## 2023-09-28 DIAGNOSIS — L578 Other skin changes due to chronic exposure to nonionizing radiation: Secondary | ICD-10-CM | POA: Diagnosis not present

## 2023-09-28 DIAGNOSIS — L820 Inflamed seborrheic keratosis: Secondary | ICD-10-CM | POA: Diagnosis not present

## 2023-09-28 DIAGNOSIS — D1801 Hemangioma of skin and subcutaneous tissue: Secondary | ICD-10-CM | POA: Diagnosis not present

## 2023-11-03 ENCOUNTER — Telehealth: Payer: Self-pay | Admitting: Family Medicine

## 2023-11-03 ENCOUNTER — Other Ambulatory Visit: Payer: Self-pay | Admitting: Family Medicine

## 2023-11-03 DIAGNOSIS — E559 Vitamin D deficiency, unspecified: Secondary | ICD-10-CM

## 2023-11-03 NOTE — Telephone Encounter (Signed)
 Letter of Medical Necessity to be filled out--placed in dr's folder.  Please use self-addressed stamped envelope to mail to patient.

## 2023-11-04 NOTE — Telephone Encounter (Signed)
Rx done and lab order entered.

## 2023-11-07 ENCOUNTER — Ambulatory Visit (INDEPENDENT_AMBULATORY_CARE_PROVIDER_SITE_OTHER): Payer: Medicare Other

## 2023-11-07 VITALS — Ht 61.0 in | Wt 119.0 lb

## 2023-11-07 DIAGNOSIS — Z0000 Encounter for general adult medical examination without abnormal findings: Secondary | ICD-10-CM | POA: Diagnosis not present

## 2023-11-07 NOTE — Progress Notes (Signed)
 Subjective:   [name] is a 89 y.o. female who presents for Medicare Annual (Subsequent) preventive examination.  Visit Complete: Virtual I connected with  [name] on 11/07/23 by a audio enabled telemedicine application and verified that I am speaking with the correct person using two identifiers.  Patient Location: Home  Provider Location: Home Office  I discussed the limitations of evaluation and management by telemedicine. The patient expressed understanding and agreed to proceed.  Vital Signs: Because this visit was a virtual/telehealth visit, some criteria may be missing or patient reported. Any vitals not documented were not able to be obtained and vitals that have been documented are patient reported.   Cardiac Risk Factors include: advanced age (>14men, >64 women);hypertension     Objective:    Today's Vitals   11/07/23 1050  Weight: 119 lb (54 kg)  Height: 5' 1 (1.549 m)   Body mass index is 22.48 kg/m.     11/07/2023   10:58 AM 10/29/2022    1:26 PM 10/28/2021    1:08 PM 11/28/2020    5:18 AM 11/17/2020    2:12 PM 11/14/2020    3:12 PM 10/27/2020   10:46 AM  Advanced Directives  Does Patient Have a Medical Advance Directive? Yes Yes Yes No Yes Yes Yes  Type of Estate Agent of [place];Living will Healthcare Power of [place];Living will Healthcare Power of [place];Living will  Healthcare Power of [place];Living will Healthcare Power of [place];Living will Healthcare Power of [place];Living will  Does patient want to make changes to medical advance directive?   No - Patient declined No - Patient declined  No - Patient declined No - Patient declined  Copy of Healthcare Power of Attorney in Chart? No - copy requested No - copy requested No - copy requested  No - copy requested No - copy requested No - copy requested  Would patient like information on creating a medical advance directive?    No - Patient declined        Current Medications (verified) Outpatient Encounter Medications as of 11/07/2023  Medication Sig   amoxicillin -clavulanate (AUGMENTIN ) 875-125 MG tablet Take 1 tablet by mouth 2 (two) times daily. (Patient not taking: Reported on 11/07/2023)   bisoprolol -hydrochlorothiazide  (ZIAC ) 5-6.25 MG tablet TAKE 1 TABLET BY MOUTH EVERY DAY   Calcium -Vitamin D -Vitamin K (VIACTIV CALCIUM  PLUS D) 650-12.5-40 MG-MCG-MCG CHEW As directed   feeding supplement (BOOST / RESOURCE BREEZE) LIQD Take 1 Container by mouth 2 (two) times daily.   Glucosamine-Chondroitin 250-200 MG TABS Take 1 tablet by mouth daily.   hydrocortisone  (ANUSOL -HC) 2.5 % rectal cream Place 1 application. rectally 2 (two) times daily.   hydrocortisone -pramoxine (ANALPRAM  HC) 2.5-1 % rectal cream Place 1 application. rectally 3 (three) times daily.   Multiple Vitamin (MULTIVITAMIN WITH MINERALS) TABS tablet Take 1 tablet by mouth daily.   Omega-3 Fatty Acids (FISH OIL ) 1000 MG CAPS Take 2 capsules (2,000 mg total) by mouth 2 (two) times daily.   prednisoLONE acetate (PRED FORTE) 1 % ophthalmic suspension SMARTSIG:In Eye(s)   traMADol  (ULTRAM ) 50 MG tablet Take 50 mg by mouth every 6 (six) hours as needed. (Patient not taking: Reported on 01/14/2023)   Vitamin D , Ergocalciferol , (DRISDOL ) 1.25 MG (50000 UNIT) CAPS capsule TAKE 1 CAPSULE (50,000 UNITS TOTAL) BY MOUTH EVERY 7 (SEVEN) DAYS   No facility-administered encounter medications on file as of 11/07/2023.    Allergies (verified) Codeine and Flagyl  [metronidazole  hcl]   History: Past Medical History:  Diagnosis  Date   Constipation    Diverticulosis    Hypertension    Inguinal hernia, left    Irritable bowel syndrome    Osteoporosis    UTI (lower urinary tract infection)    hx of frequent uti's   Past Surgical History:  Procedure Laterality Date   APPENDECTOMY     BREAST SURGERY Left    biopsy   CERVICAL FUSION     colon polyps     FINGER SURGERY     HEMORRHOID SURGERY      HIP SURGERY Right    INGUINAL HERNIA REPAIR Left 01/13/2017   Procedure: LEFT INGUINAL HERNIA REPAIR WITH MESH;  Surgeon: Krystal Russell, MD;  Location: [facility];  Service: General;  Laterality: Left;   INSERTION OF MESH Left 01/13/2017   Procedure: INSERTION OF MESH;  Surgeon: Krystal Russell, MD;  Location: [facility];  Service: General;  Laterality: Left;   LUMBAR FUSION     LUMBAR LAMINECTOMY     partial resection of colon for tumor     SHOULDER SURGERY Left    TOTAL HIP ARTHROPLASTY     right   TOTAL HIP REVISION Right 11/17/2020   Procedure: ORIF of Right periprosthetic fracture;  Surgeon: Ernie Cough, MD;  Location: [facility];  Service: Orthopedics;  Laterality: Right;   Family History  Problem Relation Age of Onset   Colon cancer Mother    Cancer Father    Lung cancer Father    Ovarian cancer Sister    Breast cancer Sister    Liver cancer Brother    Heart disease Brother    Social History   Socioeconomic History   Marital status: Married    Spouse name: Not on file   Number of children: Not on file   Years of education: Not on file   Highest education level: Not on file  Occupational History   Occupation: retired    Associate Professor: RETIRED  Tobacco Use   Smoking status: Never   Smokeless tobacco: Never  Vaping Use   Vaping status: Never Used  Substance and Sexual Activity   Alcohol  use: No   Drug use: No   Sexual activity: Not Currently  Other Topics Concern   Not on file  Social History Narrative   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   Married x 62 years   1 child   2 grand and 1.5 great grands    Social Drivers of Corporate Investment Banker Strain: Low Risk  (11/07/2023)   Overall Financial Resource Strain (CARDIA)    Difficulty of Paying Living Expenses: Not hard at all  Food Insecurity: No Food Insecurity (11/07/2023)   Hunger Vital Sign    Worried About Running Out of Food in the Last Year: Never true    Ran Out of  Food in the Last Year: Never true  Transportation Needs: No Transportation Needs (11/07/2023)   PRAPARE - Administrator, Civil Service (Medical): No    Lack of Transportation (Non-Medical): No  Physical Activity: Sufficiently Active (11/07/2023)   Exercise Vital Sign    Days of Exercise per Week: 7 days    Minutes of Exercise per Session: 30 min  Stress: No Stress Concern Present (11/07/2023)   [employer] of Occupational Health - Occupational Stress Questionnaire    Feeling of Stress : Not at all  Social Connections: Socially Integrated (11/07/2023)   Social Connection and Isolation Panel [NHANES]    Frequency of  Communication with Friends and Family: More than three times a week    Frequency of Social Gatherings with Friends and Family: More than three times a week    Attends Religious Services: More than 4 times per year    Active Member of [employer] or Organizations: Yes    Attends Engineer, Structural: More than 4 times per year    Marital Status: Married    Tobacco Counseling Counseling given: Not Answered   Clinical Intake:  Pre-visit preparation completed: Yes  Pain : No/denies pain     BMI - recorded: 22.48 Nutritional Status: BMI of 19-24  Normal Nutritional Risks: None Diabetes: No  How often do you need to have someone help you when you read instructions, pamphlets, or other written materials from your doctor or pharmacy?: 1 - Never  Interpreter Needed?: No  Information entered by :: Rojelio Blush LPN   Activities of Daily Living    11/07/2023   10:57 AM  In your present state of health, do you have any difficulty performing the following activities:  Hearing? 0  Vision? 0  Difficulty concentrating or making decisions? 0  Walking or climbing stairs? 0  Dressing or bathing? 0  Doing errands, shopping? 0  Preparing Food and eating ? N  Using the Toilet? N  In the past six months, have you accidently leaked urine? N  Do you have  problems with loss of bowel control? N  Managing your Medications? N  Managing your Finances? N  Housekeeping or managing your Housekeeping? N    Patient Care Team: Micheal Wolm ORN, MD as PCP - General (Family Medicine) Liane Sharyne MATSU, [facility] (Inactive) as Pharmacist (Pharmacist)  Indicate any recent Medical Services you may have received from other than [facility] providers in the past year (date may be approximate).     Assessment:   This is a routine wellness examination for [place].  Hearing/Vision screen Hearing Screening - Comments:: Denies hearing difficulties   Vision Screening - Comments:: Wears rx glasses - up to date with routine eye exams with  Dr Milissa   Goals Addressed               This Visit's Progress     Stay Active (pt-stated)         Depression Screen    11/07/2023   10:56 AM 01/14/2023   10:57 AM 12/20/2022   10:25 AM 10/29/2022    1:23 PM 07/12/2022   11:53 AM 10/28/2021    1:03 PM 10/27/2020   10:49 AM  PHQ 2/9 Scores  PHQ - 2 Score 0 0 0 0 0 0 0  PHQ- 9 Score     0  0    Fall Risk    11/07/2023   10:57 AM 01/14/2023   10:56 AM 12/20/2022   10:25 AM 10/29/2022    1:24 PM 07/12/2022   11:54 AM  Fall Risk   Falls in the past year? 0 0 1 1 1   Number falls in past yr: 0 0 0 0 0  Injury with Fall? 0 0 0 1 1  Comment    Left finger fx. Followed by Medical attention   Risk for fall due to : No Fall Risks No Fall Risks Other (Comment) No Fall Risks History of fall(s)  Follow up Falls prevention discussed Falls evaluation completed Falls evaluation completed Falls prevention discussed Falls evaluation completed    MEDICARE RISK AT HOME: Medicare Risk at Home Any stairs in or around  the home?: No If so, are there any without handrails?: No Home free of loose throw rugs in walkways, pet beds, electrical cords, etc?: Yes Adequate lighting in your home to reduce risk of falls?: Yes Life alert?: No Use of a cane, walker or w/c?: No Grab bars in the  bathroom?: Yes Shower chair or bench in shower?: No Elevated toilet seat or a handicapped toilet?: No  TIMED UP AND GO:  Was the test performed?  No    Cognitive Function:    05/23/2018    2:27 PM  MMSE - Mini Mental State Exam  Not completed: --        11/07/2023   10:58 AM 10/29/2022    1:26 PM 10/28/2021    1:06 PM 10/27/2020   10:51 AM 12/01/2016   10:47 AM  6CIT Screen  What Year? 0 points 0 points 0 points 0 points 0 points  What month? 0 points 0 points 0 points 0 points 0 points  What time? 0 points 0 points 0 points 0 points 0 points  Count back from 20 0 points 0 points 0 points 0 points 0 points  Months in reverse 0 points 0 points 0 points 0 points 0 points  Repeat phrase 0 points 0 points 2 points 6 points 2 points  Total Score 0 points 0 points 2 points 6 points 2 points    Immunizations Immunization History  Administered Date(s) Administered   H1N1 10/02/2008   Influenza Split 09/30/2011, 10/10/2012   Influenza Whole 09/04/2007, 07/30/2008, 10/08/2009, 09/11/2010   Influenza, High Dose Seasonal PF 07/28/2017   Influenza,inj,Quad PF,6+ Mos 07/11/2013, 09/18/2018, 07/27/2019, 08/05/2020, 07/12/2022   Influenza-Unspecified 09/11/2014, 08/25/2016, 08/04/2020   PFIZER(Purple Top)SARS-COV-2 Vaccination 02/04/2020, 02/27/2020, 09/07/2020   Pneumococcal Conjugate-13 11/22/2014   Pneumococcal Polysaccharide-23 12/04/2007   Td 04/02/2008   Zoster, Live 11/26/2013    TDAP status: Due, Education has been provided regarding the importance of this vaccine. Advised may receive this vaccine at local pharmacy or Health Dept. Aware to provide a copy of the vaccination record if obtained from local pharmacy or Health Dept. Verbalized acceptance and understanding.  Flu Vaccine status: Due, Education has been provided regarding the importance of this vaccine. Advised may receive this vaccine at local pharmacy or Health Dept. Aware to provide a copy of the vaccination record if  obtained from local pharmacy or Health Dept. Verbalized acceptance and understanding.  Pneumococcal vaccine status: Up to date  Covid-19 vaccine status: Declined, Education has been provided regarding the importance of this vaccine but patient still declined. Advised may receive this vaccine at local pharmacy or Health Dept.or vaccine clinic. Aware to provide a copy of the vaccination record if obtained from local pharmacy or Health Dept. Verbalized acceptance and understanding.  Qualifies for Shingles Vaccine? Yes   Zostavax completed No   Shingrix Completed?: No.    Education has been provided regarding the importance of this vaccine. Patient has been advised to call insurance company to determine out of pocket expense if they have not yet received this vaccine. Advised may also receive vaccine at local pharmacy or Health Dept. Verbalized acceptance and understanding.  Screening Tests Health Maintenance  Topic Date Due   Zoster Vaccines- Shingrix (1 of 2) 02/20/1951   DTaP/Tdap/Td (2 - Tdap) 04/02/2018   INFLUENZA VACCINE  05/26/2023   COVID-19 Vaccine (4 - 2024-25 season) 06/26/2023   Medicare Annual Wellness (AWV)  11/06/2024   Pneumonia Vaccine 54+ Years old  Completed   DEXA SCAN  Completed   HPV VACCINES  Aged Out    Health Maintenance  Health Maintenance Due  Topic Date Due   Zoster Vaccines- Shingrix (1 of 2) 02/20/1951   DTaP/Tdap/Td (2 - Tdap) 04/02/2018   INFLUENZA VACCINE  05/26/2023   COVID-19 Vaccine (4 - 2024-25 season) 06/26/2023        Bone Density status: Completed 11/28/17. Results reflect: Bone density results: OSTEOPOROSIS. Repeat every   years.    Additional Screening:    Vision Screening: Recommended annual ophthalmology exams for early detection of glaucoma and other disorders of the eye. Is the patient up to date with their annual eye exam?  Yes  Who is the provider or what is the name of the office in which the patient attends annual eye exams?  Dr Milissa If pt is not established with a provider, would they like to be referred to a provider to establish care? No .   Dental Screening: Recommended annual dental exams for proper oral hygiene    Community Resource Referral /  Chronic Care Management: CRR required this visit?  No   CCM required this visit?  No     Plan:     I have personally reviewed and noted the following in the patient's chart:   Medical and social history Use of alcohol , tobacco or illicit drugs  Current medications and supplements including opioid prescriptions. Patient is not currently taking opioid prescriptions. Functional ability and status Nutritional status Physical activity Advanced directives List of other physicians Hospitalizations, surgeries, and ER visits in previous 12 months Vitals Screenings to include cognitive, depression, and falls Referrals and appointments  In addition, I have reviewed and discussed with patient certain preventive protocols, quality metrics, and best practice recommendations. A written personalized care plan for preventive services as well as general preventive health recommendations were provided to patient.     Rojelio LELON Blush, LPN   [id number]/[id number]   After Visit Summary: (MyChart) Due to this being a telephonic visit, the after visit summary with patients personalized plan was offered to patient via MyChart   Nurse Notes: None

## 2023-11-07 NOTE — Patient Instructions (Addendum)
 Ms. Bachmann , Thank you for taking time to come for your Medicare Wellness Visit. I appreciate your ongoing commitment to your health goals. Please review the following plan we discussed and let me know if I can assist you in the future.   Referrals/Orders/Follow-Ups/Clinician Recommendations:   This is a list of the screening recommended for you and due dates:  Health Maintenance  Topic Date Due   Zoster (Shingles) Vaccine (1 of 2) 02/20/1951   DTaP/Tdap/Td vaccine (2 - Tdap) 04/02/2018   Flu Shot  05/26/2023   COVID-19 Vaccine (4 - 2024-25 season) 06/26/2023   Medicare Annual Wellness Visit  11/06/2024   Pneumonia Vaccine  Completed   DEXA scan (bone density measurement)  Completed   HPV Vaccine  Aged Out    Advanced directives: (Copy Requested) Please bring a copy of your health care power of attorney and living will to the office to be added to your chart at your convenience.  Next Medicare Annual Wellness Visit scheduled for next year: Yes

## 2023-11-09 ENCOUNTER — Telehealth: Payer: Self-pay

## 2023-11-09 MED ORDER — GLUCOSAMINE-CHONDROITIN 250-200 MG PO TABS: 1.0000 | ORAL_TABLET | Freq: Every day | ORAL | 3 refills | Status: DC

## 2023-11-09 MED ORDER — FISH OIL 1000 MG PO CAPS: 2000.0000 mg | ORAL_CAPSULE | Freq: Two times a day (BID) | ORAL | 3 refills | Status: DC

## 2023-11-09 MED ORDER — VITAMIN D (ERGOCALCIFEROL) 1.25 MG (50000 UNIT) PO CAPS: 50000.0000 [IU] | ORAL_CAPSULE | ORAL | 0 refills | Status: DC

## 2023-11-09 MED ORDER — VIACTIV CALCIUM PLUS D 650-12.5-40 MG-MCG PO CHEW: CHEWABLE_TABLET | ORAL | 3 refills | Status: DC

## 2023-11-09 NOTE — Telephone Encounter (Signed)
 I spoke with the patient and she requested updated prescription for medications. Prescription placed in red folder for sign

## 2023-11-09 NOTE — Telephone Encounter (Signed)
 Copied from CRM [id number]. Topic: General - Other >> Nov 09, 2023 12:53 PM Corin V wrote: Reason for CRM: Patient brought in medical forms to get filled out to get her medications. The forms were returned to her but were dated for January 2024. She needs the dates to be fixed to be 11/04/23. Please fix paperwork and re-mail to patient.

## 2023-11-09 NOTE — Addendum Note (Signed)
 Addended by: Aurelio Leer on: 11/09/2023 04:26 PM   Modules accepted: Orders

## 2023-11-27 ENCOUNTER — Other Ambulatory Visit: Payer: Self-pay | Admitting: Family Medicine

## 2023-11-29 ENCOUNTER — Telehealth: Payer: Self-pay

## 2023-11-29 NOTE — Telephone Encounter (Signed)
 Pt ready for scheduling on or after 12/28/23  Estimated out-of-pocket cost due at time of visit: $0  Primary Insurance: Medicare  Secondary Insurance: AARP  Deductible: $0 out of $257 [employer] covers)  This summary of benefits is an estimation of the patient's out-of-pocket cost. Exact cost may vary based on individual plan coverage.

## 2023-11-30 NOTE — Telephone Encounter (Signed)
 Patient has been scheduled

## 2023-12-01 ENCOUNTER — Other Ambulatory Visit: Payer: Self-pay | Admitting: Family Medicine

## 2023-12-24 ENCOUNTER — Other Ambulatory Visit: Payer: Self-pay | Admitting: Family Medicine

## 2023-12-26 ENCOUNTER — Telehealth: Payer: Self-pay

## 2023-12-26 NOTE — Telephone Encounter (Signed)
 Patient has been scheduled to see PCP 12/27/23

## 2023-12-26 NOTE — Telephone Encounter (Signed)
Error already done

## 2023-12-26 NOTE — Telephone Encounter (Signed)
 Copied from CRM [id number]. Topic: Clinical - Request for Lab/Test Order >> Dec 26, 2023 10:07 AM Thomes Dinning wrote: Reason for CRM: Patient is requesting a call back at [phone] to get blood work done because she is feeling weak. I advised patient the provider would need to order the labs before we could get her scheduled.

## 2023-12-27 ENCOUNTER — Encounter: Payer: Self-pay | Admitting: Family Medicine

## 2023-12-27 ENCOUNTER — Ambulatory Visit (INDEPENDENT_AMBULATORY_CARE_PROVIDER_SITE_OTHER): Admitting: Family Medicine

## 2023-12-27 VITALS — BP 146/80 | HR 74 | Temp 97.6°F | Ht 61.0 in | Wt 118.8 lb

## 2023-12-27 DIAGNOSIS — M791 Myalgia, unspecified site: Secondary | ICD-10-CM | POA: Diagnosis not present

## 2023-12-27 DIAGNOSIS — I10 Essential (primary) hypertension: Secondary | ICD-10-CM | POA: Diagnosis not present

## 2023-12-27 DIAGNOSIS — E559 Vitamin D deficiency, unspecified: Secondary | ICD-10-CM | POA: Diagnosis not present

## 2023-12-27 DIAGNOSIS — R5383 Other fatigue: Secondary | ICD-10-CM

## 2023-12-27 DIAGNOSIS — R202 Paresthesia of skin: Secondary | ICD-10-CM | POA: Diagnosis not present

## 2023-12-27 MED ORDER — GABAPENTIN 100 MG PO CAPS: 100.0000 mg | ORAL_CAPSULE | Freq: Every day | ORAL | 3 refills | Status: DC

## 2023-12-27 NOTE — Progress Notes (Signed)
 Established Patient Office Visit  Subjective   Patient ID: [name], female    DOB: [birth year]  Age: 89 y.o. MRN: [id number]  Chief Complaint  Patient presents with   Nausea    After eating    Generalized Body Aches    Started a week ago    Numbness    In both feet    HPI   [name] has history of hypertension, IBS, osteoarthritis of multiple joints, osteoporosis, chronic normocytic anemia.  She is seen today with 1 week history of increased fatigue and feeling "drained ".  She has had some low-grade nausea without vomiting.  No stool changes.  No urinary symptoms.  Denies any fevers or chills.  No abdominal pain.  Has had some achiness in her upper back and going down the right arm for the past week.  Denies any chest pains.  No dyspnea.  Slightly diminished appetite last week but weight is stable overall.  She has history of neuropathy symptoms in her feet and is requesting prescription to go back on low-dose gabapentin.  100 mg nightly has helped in the past.  She actually has very little pain but more numbness.  She felt like the gabapentin helped her rest better with the neuropathy symptoms.  She has no history of diabetes.  Past Medical History:  Diagnosis Date   Constipation    Diverticulosis    Hypertension    Inguinal hernia, left    Irritable bowel syndrome    Osteoporosis    UTI (lower urinary tract infection)    hx of frequent uti's   Past Surgical History:  Procedure Laterality Date   APPENDECTOMY     BREAST SURGERY Left    biopsy   CERVICAL FUSION     colon polyps     FINGER SURGERY     HEMORRHOID SURGERY     HIP SURGERY Right    INGUINAL HERNIA REPAIR Left 01/13/2017   Procedure: LEFT INGUINAL HERNIA REPAIR WITH MESH;  Surgeon: Avel Peace, MD;  Location: [facility];  Service: General;  Laterality: Left;   INSERTION OF MESH Left 01/13/2017   Procedure: INSERTION OF MESH;  Surgeon: Avel Peace, MD;  Location: [facility];  Service: General;  Laterality: Left;   LUMBAR FUSION     LUMBAR LAMINECTOMY     partial resection of colon for tumor     SHOULDER SURGERY Left    TOTAL HIP ARTHROPLASTY     right   TOTAL HIP REVISION Right 11/17/2020   Procedure: ORIF of Right periprosthetic fracture;  Surgeon: Durene Romans, MD;  Location: [facility];  Service: Orthopedics;  Laterality: Right;    reports that she has never smoked. She has never used smokeless tobacco. She reports that she does not drink alcohol and does not use drugs. family history includes Breast cancer in her sister; Cancer in her father; Colon cancer in her mother; Heart disease in her brother; Liver cancer in her brother; Lung cancer in her father; Ovarian cancer in her sister. Allergies  Allergen Reactions   Codeine     REACTION: headaches   Flagyl [Metronidazole Hcl]     nausea    Review of Systems  Constitutional:  Positive for malaise/fatigue. Negative for chills, fever and weight loss.  Eyes:  Negative for blurred vision.  Respiratory:  Negative for shortness of breath.   Cardiovascular:  Negative for chest pain.  Gastrointestinal:  Positive for nausea. Negative for abdominal pain, blood  in stool, diarrhea, melena and vomiting.  Genitourinary:  Negative for dysuria.  Musculoskeletal:  Positive for myalgias.  Neurological:  Negative for dizziness, weakness and headaches.      Objective:     BP (!) 146/80   Pulse 74   Temp 97.6 F (36.4 C) (Oral)   Ht 5\' 1"  (1.549 m)   Wt 118 lb 12.8 oz (53.9 kg)   SpO2 98%   BMI 22.45 kg/m  BP Readings from Last 3 Encounters:  12/27/23 (!) 146/80  01/14/23 (!) 142/72  12/20/22 120/80   Wt Readings from Last 3 Encounters:  12/27/23 118 lb 12.8 oz (53.9 kg)  11/07/23 119 lb (54 kg)  01/14/23 119 lb 6.4 oz (54.2 kg)      Physical Exam Vitals reviewed.  Constitutional:      General: She is not in acute distress.    Appearance: She is not ill-appearing.   Cardiovascular:     Rate and Rhythm: Normal rate and regular rhythm.  Pulmonary:     Effort: Pulmonary effort is normal.     Breath sounds: Normal breath sounds. No wheezing or rales.  Abdominal:     Palpations: Abdomen is soft.     Tenderness: There is no abdominal tenderness.  Musculoskeletal:     Cervical back: Neck supple.     Right lower leg: No edema.     Left lower leg: No edema.  Neurological:     General: No focal deficit present.     Mental Status: She is alert.      No results found for any visits on 12/27/23.  Last CBC Lab Results  Component Value Date   WBC 10.0 12/20/2022   HGB 11.3 (L) 12/20/2022   HCT 33.2 (L) 12/20/2022   MCV 92.1 12/20/2022   MCH 31.1 11/28/2020   RDW 12.1 12/20/2022   PLT 273.0 12/20/2022   Last metabolic panel Lab Results  Component Value Date   GLUCOSE 87 12/20/2022   NA 139 12/20/2022   K 4.6 12/20/2022   CL 101 12/20/2022   CO2 27 12/20/2022   BUN 29 (H) 12/20/2022   CREATININE 0.80 12/20/2022   GFR 65.60 12/20/2022   CALCIUM 10.6 (H) 12/20/2022   PROT 6.9 02/16/2021   ALBUMIN 3.0 (L) 11/15/2020   BILITOT 0.8 11/15/2020   ALKPHOS 35 (L) 11/15/2020   AST 21 11/15/2020   ALT 11 11/15/2020   ANIONGAP 14 11/28/2020   Last hemoglobin A1c No results found for: "HGBA1C" Last thyroid functions Lab Results  Component Value Date   TSH 2.88 02/16/2021   Last vitamin D Lab Results  Component Value Date   VD25OH 82.08 12/20/2022      The ASCVD Risk score (Arnett DK, et al., 2019) failed to calculate for the following reasons:   The 2019 ASCVD risk score is only valid for ages 64 to 13    Assessment & Plan:   Problem List Items Addressed This Visit   None Visit Diagnoses       Fatigue, unspecified type    -  Primary   Relevant Orders   CBC with Differential/Platelet   CMP   TSH     Myalgia       Relevant Orders   Sedimentation rate     Vitamin D deficiency       Relevant Orders   VITAMIN D 25 Hydroxy  (Vit-D Deficiency, Fractures)     Paresthesia       Relevant Orders   Vitamin  B34     89 year old female with chronic medical problems as above.  She is presenting today with 1 week history of fatigue and some generalized weakness.  She also describes some upper back achiness right side greater than left.  Denies any fever.  No weight changes.  Nonfocal exam.  Etiology of her fatigue unclear.  Doubt PMR but given her age and relatively acute onset of fatigue and upper back aches check sed rate.  Check additional labs as above.  Patient also requesting refill of gabapentin which she has taken in the past 100 mg nightly for neuropathy symptoms in her feet which tend to be worse at night.  She has osteoporosis and is due for Prolia injection soon.  Checking labs including calcium levels and recheck vitamin D  No follow-ups on file.    Evelena Peat, MD

## 2023-12-28 LAB — CBC WITH DIFFERENTIAL/PLATELET
Basophils Absolute: 0.1 10*3/uL (ref 0.0–0.1)
Basophils Relative: 0.7 % (ref 0.0–3.0)
Eosinophils Absolute: 0.2 10*3/uL (ref 0.0–0.7)
Eosinophils Relative: 2 % (ref 0.0–5.0)
HCT: 32.4 % — ABNORMAL LOW (ref 36.0–46.0)
Hemoglobin: 10.7 g/dL — ABNORMAL LOW (ref 12.0–15.0)
Lymphocytes Relative: 23.7 % (ref 12.0–46.0)
Lymphs Abs: 2.3 10*3/uL (ref 0.7–4.0)
MCHC: 33.1 g/dL (ref 30.0–36.0)
MCV: 93.8 fl (ref 78.0–100.0)
Monocytes Absolute: 0.8 10*3/uL (ref 0.1–1.0)
Monocytes Relative: 7.8 % (ref 3.0–12.0)
Neutro Abs: 6.3 10*3/uL (ref 1.4–7.7)
Neutrophils Relative %: 65.8 % (ref 43.0–77.0)
Platelets: 243 10*3/uL (ref 150.0–400.0)
RBC: 3.45 Mil/uL — ABNORMAL LOW (ref 3.87–5.11)
RDW: 12.7 % (ref 11.5–15.5)
WBC: 9.6 10*3/uL (ref 4.0–10.5)

## 2023-12-28 LAB — COMPREHENSIVE METABOLIC PANEL
ALT: 11 U/L (ref 0–35)
AST: 22 U/L (ref 0–37)
Albumin: 4.1 g/dL (ref 3.5–5.2)
Alkaline Phosphatase: 48 U/L (ref 39–117)
BUN: 27 mg/dL — ABNORMAL HIGH (ref 6–23)
CO2: 26 meq/L (ref 19–32)
Calcium: 9.5 mg/dL (ref 8.4–10.5)
Chloride: 96 meq/L (ref 96–112)
Creatinine, Ser: 0.82 mg/dL (ref 0.40–1.20)
GFR: 63.23 mL/min (ref >60.00)
Glucose, Bld: 94 mg/dL (ref 70–99)
Potassium: 4.2 meq/L (ref 3.5–5.1)
Sodium: 131 meq/L — ABNORMAL LOW (ref 135–145)
Total Bilirubin: 0.4 mg/dL (ref 0.2–1.2)
Total Protein: 7 g/dL (ref 6.0–8.3)

## 2023-12-28 LAB — TSH: TSH: 2.46 u[IU]/mL (ref 0.35–5.50)

## 2023-12-28 LAB — SEDIMENTATION RATE: Sed Rate: 30 mm/h (ref 0–30)

## 2023-12-28 LAB — VITAMIN D 25 HYDROXY (VIT D DEFICIENCY, FRACTURES): VITD: 85.84 ng/mL (ref 30.00–100.00)

## 2023-12-28 LAB — VITAMIN B12: Vitamin B-12: 775 pg/mL (ref 211–911)

## 2023-12-29 ENCOUNTER — Ambulatory Visit: Payer: Medicare Other

## 2023-12-29 DIAGNOSIS — M818 Other osteoporosis without current pathological fracture: Secondary | ICD-10-CM | POA: Diagnosis not present

## 2023-12-29 MED ORDER — DENOSUMAB 60 MG/ML ~~LOC~~ SOSY
60.0000 mg | PREFILLED_SYRINGE | Freq: Once | SUBCUTANEOUS | Status: AC
Start: 2023-12-29 — End: 2023-12-29
  Administered 2023-12-29: 60 mg via SUBCUTANEOUS

## 2023-12-29 NOTE — Progress Notes (Signed)
 Per orders of Dr. Ardyth Harps, injection of Prolia 60 mg/ml given by Mikyle Sox L Corban Kistler. Patient tolerated injection well.

## 2023-12-30 ENCOUNTER — Inpatient Hospital Stay (HOSPITAL_COMMUNITY)
Admission: EM | Admit: 2023-12-30 | Discharge: 2024-01-01 | DRG: 812 | Disposition: A | Discharge status: Home / Self Care | Attending: Family Medicine | Admitting: Family Medicine

## 2023-12-30 ENCOUNTER — Other Ambulatory Visit: Payer: Self-pay

## 2023-12-30 ENCOUNTER — Ambulatory Visit: Payer: Self-pay | Admitting: Family Medicine

## 2023-12-30 ENCOUNTER — Ambulatory Visit: Admitting: Internal Medicine

## 2023-12-30 ENCOUNTER — Encounter (HOSPITAL_COMMUNITY): Payer: Self-pay

## 2023-12-30 ENCOUNTER — Telehealth: Payer: Self-pay

## 2023-12-30 DIAGNOSIS — Z803 Family history of malignant neoplasm of breast: Secondary | ICD-10-CM

## 2023-12-30 DIAGNOSIS — D72829 Elevated white blood cell count, unspecified: Secondary | ICD-10-CM | POA: Diagnosis present

## 2023-12-30 DIAGNOSIS — Z79899 Other long term (current) drug therapy: Secondary | ICD-10-CM | POA: Diagnosis not present

## 2023-12-30 DIAGNOSIS — M810 Age-related osteoporosis without current pathological fracture: Secondary | ICD-10-CM | POA: Diagnosis present

## 2023-12-30 DIAGNOSIS — E785 Hyperlipidemia, unspecified: Secondary | ICD-10-CM | POA: Diagnosis not present

## 2023-12-30 DIAGNOSIS — Z885 Allergy status to narcotic agent status: Secondary | ICD-10-CM

## 2023-12-30 DIAGNOSIS — Z800 Family history of malignant neoplasm of digestive organs: Secondary | ICD-10-CM | POA: Diagnosis not present

## 2023-12-30 DIAGNOSIS — Z8249 Family history of ischemic heart disease and other diseases of the circulatory system: Secondary | ICD-10-CM | POA: Diagnosis not present

## 2023-12-30 DIAGNOSIS — R000 Tachycardia, unspecified: Secondary | ICD-10-CM | POA: Diagnosis not present

## 2023-12-30 DIAGNOSIS — K648 Other hemorrhoids: Secondary | ICD-10-CM | POA: Diagnosis present

## 2023-12-30 DIAGNOSIS — Z87440 Personal history of urinary (tract) infections: Secondary | ICD-10-CM

## 2023-12-30 DIAGNOSIS — I10 Essential (primary) hypertension: Secondary | ICD-10-CM | POA: Diagnosis not present

## 2023-12-30 DIAGNOSIS — Z981 Arthrodesis status: Secondary | ICD-10-CM | POA: Diagnosis not present

## 2023-12-30 DIAGNOSIS — K5790 Diverticulosis of intestine, part unspecified, without perforation or abscess without bleeding: Secondary | ICD-10-CM | POA: Diagnosis not present

## 2023-12-30 DIAGNOSIS — K5730 Diverticulosis of large intestine without perforation or abscess without bleeding: Secondary | ICD-10-CM | POA: Diagnosis not present

## 2023-12-30 DIAGNOSIS — D500 Iron deficiency anemia secondary to blood loss (chronic): Secondary | ICD-10-CM | POA: Diagnosis not present

## 2023-12-30 DIAGNOSIS — Z96641 Presence of right artificial hip joint: Secondary | ICD-10-CM | POA: Diagnosis present

## 2023-12-30 DIAGNOSIS — D649 Anemia, unspecified: Secondary | ICD-10-CM | POA: Diagnosis not present

## 2023-12-30 DIAGNOSIS — E871 Hypo-osmolality and hyponatremia: Secondary | ICD-10-CM | POA: Diagnosis present

## 2023-12-30 DIAGNOSIS — R110 Nausea: Secondary | ICD-10-CM | POA: Diagnosis not present

## 2023-12-30 DIAGNOSIS — Z881 Allergy status to other antibiotic agents status: Secondary | ICD-10-CM | POA: Diagnosis not present

## 2023-12-30 DIAGNOSIS — Z860100 Personal history of colon polyps, unspecified: Secondary | ICD-10-CM

## 2023-12-30 DIAGNOSIS — R0602 Shortness of breath: Secondary | ICD-10-CM | POA: Diagnosis not present

## 2023-12-30 DIAGNOSIS — Z8041 Family history of malignant neoplasm of ovary: Secondary | ICD-10-CM

## 2023-12-30 DIAGNOSIS — Z801 Family history of malignant neoplasm of trachea, bronchus and lung: Secondary | ICD-10-CM | POA: Diagnosis not present

## 2023-12-30 LAB — URINALYSIS, ROUTINE W REFLEX MICROSCOPIC
Bilirubin Urine: NEGATIVE
Glucose, UA: NEGATIVE mg/dL
Hgb urine dipstick: NEGATIVE
Ketones, ur: NEGATIVE mg/dL
Leukocytes,Ua: NEGATIVE
Nitrite: NEGATIVE
Protein, ur: NEGATIVE mg/dL
Specific Gravity, Urine: 1.018 (ref 1.005–1.030)
pH: 5 (ref 5.0–8.0)

## 2023-12-30 LAB — BASIC METABOLIC PANEL
Anion gap: 11 (ref 5–15)
BUN: 65 mg/dL — ABNORMAL HIGH (ref 8–23)
CO2: 20 mmol/L — ABNORMAL LOW (ref 22–32)
Calcium: 8.4 mg/dL — ABNORMAL LOW (ref 8.9–10.3)
Chloride: 103 mmol/L (ref 98–111)
Creatinine, Ser: 0.79 mg/dL (ref 0.44–1.00)
GFR, Estimated: >60 mL/min (ref >60)
Glucose, Bld: 147 mg/dL — ABNORMAL HIGH (ref 70–99)
Potassium: 3.9 mmol/L (ref 3.5–5.1)
Sodium: 134 mmol/L — ABNORMAL LOW (ref 135–145)

## 2023-12-30 LAB — CBC
HCT: 24.8 % — ABNORMAL LOW (ref 36.0–46.0)
Hemoglobin: 7.9 g/dL — ABNORMAL LOW (ref 12.0–15.0)
MCH: 30.9 pg (ref 26.0–34.0)
MCHC: 31.9 g/dL (ref 30.0–36.0)
MCV: 96.9 fL (ref 80.0–100.0)
Platelets: 236 10*3/uL (ref 150–400)
RBC: 2.56 MIL/uL — ABNORMAL LOW (ref 3.87–5.11)
RDW: 12.1 % (ref 11.5–15.5)
WBC: 14 10*3/uL — ABNORMAL HIGH (ref 4.0–10.5)
nRBC: 0 % (ref 0.0–0.2)

## 2023-12-30 LAB — PREPARE RBC (CROSSMATCH)

## 2023-12-30 LAB — CBG MONITORING, ED: Glucose-Capillary: 135 mg/dL — ABNORMAL HIGH (ref 70–99)

## 2023-12-30 LAB — POC OCCULT BLOOD, ED: Fecal Occult Bld: NEGATIVE

## 2023-12-30 LAB — TROPONIN I (HIGH SENSITIVITY)
Troponin I (High Sensitivity): 9 ng/L (ref <18)
Troponin I (High Sensitivity): 10 ng/L (ref <18)

## 2023-12-30 MED ORDER — SODIUM CHLORIDE 0.9% IV SOLUTION: Freq: Once | INTRAVENOUS | Status: AC

## 2023-12-30 MED ORDER — DEXTROSE IN LACTATED RINGERS 5 % IV SOLN: INTRAVENOUS | Status: AC

## 2023-12-30 MED ORDER — ONDANSETRON HCL 4 MG/2ML IJ SOLN
4.0000 mg | Freq: Four times a day (QID) | INTRAMUSCULAR | Status: DC | PRN
  Administered 2023-12-31: 4 mg via INTRAVENOUS
  Filled 2023-12-30 (×2): qty 2

## 2023-12-30 MED ORDER — ONDANSETRON HCL 4 MG PO TABS
4.0000 mg | ORAL_TABLET | Freq: Four times a day (QID) | ORAL | Status: DC | PRN
  Administered 2024-01-01: 4 mg via ORAL
  Filled 2023-12-30: qty 1

## 2023-12-30 MED ORDER — BOOST / RESOURCE BREEZE PO LIQD CUSTOM
1.0000 | Freq: Three times a day (TID) | ORAL | Status: DC
  Administered 2023-12-31 – 2024-01-01 (×4): 1 via ORAL

## 2023-12-30 NOTE — Progress Notes (Deleted)
 Date:  12/30/2023   Name:  [name]   DOB:  1932/01/22   MRN:  [id number]   Chief Complaint: No chief complaint on file.  HPI  Review of Systems   Lab Results  Component Value Date   NA 131 (L) 12/27/2023   K 4.2 12/27/2023   CO2 26 12/27/2023   GLUCOSE 94 12/27/2023   BUN 27 (H) 12/27/2023   CREATININE 0.82 12/27/2023   CALCIUM 9.5 12/27/2023   GFRNONAA >60 11/28/2020   Lab Results  Component Value Date   CHOL 205 (H) 05/21/2013   HDL 58.40 05/21/2013   LDLCALC 110 (H) 04/14/2012   LDLDIRECT 125.1 05/21/2013   TRIG 95.0 05/21/2013   CHOLHDL 4 05/21/2013   Lab Results  Component Value Date   TSH 2.46 12/27/2023   No results found for: "HGBA1C" Lab Results  Component Value Date   WBC 9.6 12/27/2023   HGB 10.7 (L) 12/27/2023   HCT 32.4 (L) 12/27/2023   MCV 93.8 12/27/2023   PLT 243.0 12/27/2023   Lab Results  Component Value Date   ALT 11 12/27/2023   AST 22 12/27/2023   ALKPHOS 48 12/27/2023   BILITOT 0.4 12/27/2023   Lab Results  Component Value Date   VD25OH 85.84 12/27/2023     Patient Active Problem List   Diagnosis Date Noted   Hip fracture (HCC) 11/14/2020   ACUTE FRONTAL SINUSITIS 09/28/2010   RECTAL BLEEDING 06/16/2010   ANEMIA 06/12/2010   COLONIC POLYPS, ADENOMATOUS, BENIGN 06/12/2010   URETHRAL CARUNCLE 12/02/2009   Hyperlipidemia 10/02/2008   HAIR LOSS 10/02/2008   INTERNAL HEMORRHOIDS WITH OTHER COMPLICATION 04/02/2008   CHEST PAIN, ATYPICAL 12/04/2007   Essential hypertension 05/30/2007   DIVERTICULOSIS, COLON 05/30/2007   IRRITABLE BOWEL SYNDROME 05/30/2007   DEGENERATIVE JOINT DISEASE, MILD 05/30/2007   Osteoporosis 05/30/2007    Allergies  Allergen Reactions   Codeine     REACTION: headaches   Flagyl [Metronidazole Hcl]     nausea    Past Surgical History:  Procedure Laterality Date   APPENDECTOMY     BREAST SURGERY Left    biopsy   CERVICAL FUSION     colon polyps     FINGER SURGERY      HEMORRHOID SURGERY     HIP SURGERY Right    INGUINAL HERNIA REPAIR Left 01/13/2017   Procedure: LEFT INGUINAL HERNIA REPAIR WITH MESH;  Surgeon: Avel Peace, MD;  Location: [facility]  Service: General;  Laterality: Left;   INSERTION OF MESH Left 01/13/2017   Procedure: INSERTION OF MESH;  Surgeon: Avel Peace, MD;  Location: [facility];  Service: General;  Laterality: Left;   LUMBAR FUSION     LUMBAR LAMINECTOMY     partial resection of colon for tumor     SHOULDER SURGERY Left    TOTAL HIP ARTHROPLASTY     right   TOTAL HIP REVISION Right 11/17/2020   Procedure: ORIF of Right periprosthetic fracture;  Surgeon: Durene Romans, MD;  Location: [facility];  Service: Orthopedics;  Laterality: Right;    Social History   Tobacco Use   Smoking status: Never   Smokeless tobacco: Never  Vaping Use   Vaping status: Never Used  Substance Use Topics   Alcohol use: No   Drug use: No     Medication list has been reviewed and updated.  No outpatient medications have been marked as taking for the 12/30/23 encounter (Appointment) with Reubin Milan,  MD.        No data to display             11/07/2023   10:56 AM 01/14/2023   10:57 AM 12/20/2022   10:25 AM  Depression screen PHQ 2/9  Decreased Interest 0 0 0  Down, Depressed, Hopeless 0 0 0  PHQ - 2 Score 0 0 0    BP Readings from Last 3 Encounters:  12/27/23 (!) 146/80  01/14/23 (!) 142/72  12/20/22 120/80    Physical Exam  Wt Readings from Last 3 Encounters:  12/27/23 118 lb 12.8 oz (53.9 kg)  11/07/23 119 lb (54 kg)  01/14/23 119 lb 6.4 oz (54.2 kg)    There were no vitals taken for this visit.  Assessment and Plan:  Problem List Items Addressed This Visit   None   No follow-ups on file.    Reubin Milan, MD [facility] [place]

## 2023-12-30 NOTE — ED Provider Notes (Signed)
 [facility] EMERGENCY DEPARTMENT AT [facility] Provider Note   CSN: [id number] Arrival date & time: 12/30/23  1546     History  Chief Complaint  Patient presents with   Weakness    DIGNA COUNTESS is a 89 y.o. female.  HPI   89 year old female presents emergency department with progressing weakness over the past 2 weeks, worse over the last 2 days.  She endorses fatigue, lightheadedness when walking, exertional shortness of breath.  Denies any chest pain or heaviness.  She has had some mild nausea and decreased appetite but no vomiting or diarrhea.  Denies any black stools.  Is not on any anticoagulation.  Home Medications Prior to Admission medications   Medication Sig Start Date End Date Taking? Authorizing Provider  bisoprolol-hydrochlorothiazide [facility]) 5-6.25 MG tablet TAKE 1 TABLET BY MOUTH EVERY DAY 12/26/23   Burchette, Elberta Fortis, MD  Calcium-Vitamin D-Vitamin K (VIACTIV CALCIUM PLUS D) 650-12.5-40 MG-MCG-MCG CHEW As directed 11/09/23   Burchette, Elberta Fortis, MD  feeding supplement (BOOST / RESOURCE BREEZE) LIQD Take 1 Container by mouth 2 (two) times daily.    [provider]  gabapentin (NEURONTIN) 100 MG capsule Take 1 capsule (100 mg total) by mouth at bedtime. 12/27/23   Burchette, Elberta Fortis, MD  Glucosamine-Chondroitin 250-200 MG TABS Take 1 tablet by mouth daily. Patient not taking: Reported on 12/27/2023 11/09/23   [name], MD  hydrocortisone (ANUSOL-HC) 2.5 % rectal cream Place 1 application. rectally 2 (two) times daily. 02/10/22   Burchette, Elberta Fortis, MD  hydrocortisone-pramoxine (ANALPRAM HC) 2.5-1 % rectal cream Place 1 application. rectally 3 (three) times daily. 02/08/22   Burchette, Elberta Fortis, MD  Multiple Vitamin (MULTIVITAMIN WITH MINERALS) TABS tablet Take 1 tablet by mouth daily.    [provider]  Omega-3 Fatty Acids (FISH OIL) 1000 MG CAPS Take 2 capsules (2,000 mg total) by mouth 2 (two) times daily. 11/09/23   Burchette, Elberta Fortis, MD  prednisoLONE acetate (PRED FORTE) 1 % ophthalmic suspension SMARTSIG:In Eye(s) 12/18/21   [provider]  traMADol (ULTRAM) 50 MG tablet Take 50 mg by mouth every 6 (six) hours as needed. Patient not taking: Reported on 12/27/2023 05/07/22   [provider]  Vitamin D, Ergocalciferol, (DRISDOL) 1.25 MG (50000 UNIT) CAPS capsule Take 1 capsule (50,000 Units total) by mouth every 7 (seven) days. 11/09/23   Burchette, Elberta Fortis, MD      Allergies    Codeine and Flagyl [metronidazole hcl]    Review of Systems   Review of Systems  Constitutional:  Positive for appetite change and fatigue. Negative for fever.  Respiratory:  Positive for shortness of breath.   Cardiovascular:  Negative for chest pain, palpitations and leg swelling.  Gastrointestinal:  Negative for abdominal pain, diarrhea and vomiting.  Skin:  Negative for rash.  Neurological:  Negative for headaches.    Physical Exam Updated Vital Signs BP 129/82   Pulse (!) 111   Resp 15   Ht 5\' 1"  (1.549 m)   Wt 53.9 kg   SpO2 100%   BMI 22.45 kg/m  Physical Exam Vitals and nursing note reviewed.  Constitutional:      General: She is not in acute distress.    Appearance: Normal appearance. She is not ill-appearing.  HENT:     Head: Normocephalic.     Mouth/Throat:     Mouth: Mucous membranes are moist.  Cardiovascular:     Rate and Rhythm: Normal rate.  Pulmonary:  Effort: Pulmonary effort is normal. No respiratory distress.     Breath sounds: No rales.  Abdominal:     Palpations: Abdomen is soft.     Tenderness: There is no abdominal tenderness.  Genitourinary:    Comments: External hemorrhoids, light brown stool Musculoskeletal:        General: No swelling.  Skin:    General: Skin is warm.  Neurological:     Mental Status: She is alert and oriented to person, place, and time. Mental status is at baseline.  Psychiatric:        Mood and Affect: Mood normal.     ED Results / Procedures /  Treatments   Labs (all labs ordered are listed, but only abnormal results are displayed) Labs Reviewed  BASIC METABOLIC PANEL - Abnormal; Notable for the following components:      Result Value   Sodium 134 (*)    CO2 20 (*)    Glucose, Bld 147 (*)    BUN 65 (*)    Calcium 8.4 (*)    All other components within normal limits  CBC - Abnormal; Notable for the following components:   WBC 14.0 (*)    RBC 2.56 (*)    Hemoglobin 7.9 (*)    HCT 24.8 (*)    All other components within normal limits  CBG MONITORING, ED - Abnormal; Notable for the following components:   Glucose-Capillary 135 (*)    All other components within normal limits  URINALYSIS, ROUTINE W REFLEX MICROSCOPIC  POC OCCULT BLOOD, ED  TYPE AND SCREEN    EKG EKG Interpretation Date/Time:  Friday December 30 2023 15:56:08 EST Ventricular Rate:  111 PR Interval:  155 QRS Duration:  88 QT Interval:  295 QTC Calculation: 401 R Axis:   61  Text Interpretation: Sinus tachycardia Ventricular premature complex Nonspecific T abnormalities, diffuse leads ST changes similar to previous Confirmed by Coralee Pesa [phone]) on 12/30/2023 5:11:30 PM  Radiology No results found.  Procedures .Critical Care  Performed by: Rozelle Logan, DO Authorized by: Rozelle Logan, DO   Critical care provider statement:    Critical care time (minutes):  30   Critical care was necessary to treat or prevent imminent or life-threatening deterioration of the following conditions:  Metabolic crisis   Critical care was time spent personally by me on the following activities:  Development of treatment plan with patient or surrogate, discussions with consultants, evaluation of patient's response to treatment, examination of patient, ordering and review of laboratory studies, ordering and review of radiographic studies, ordering and performing treatments and interventions, pulse oximetry, re-evaluation of patient's condition and review of old  charts   I assumed direction of critical care for this patient from another provider in my specialty: no     Care discussed with: admitting provider       Medications Ordered in ED Medications - No data to display  ED Course/ Medical Decision Making/ A&P                                 Medical Decision Making Amount and/or Complexity of Data Reviewed Labs: ordered.  Risk Prescription drug management. Decision regarding hospitalization.   89 year old female presents to the emergency department with weakness, exertional shortness of breath, lightheadedness.  This has been ongoing for the past 2 weeks, worse in the last 2 days.  History of anemia but not requiring transfusion before.  Blood work today shows an acute anemia of 7.9.  Her baseline is 12.  Blood work done a couple days ago when she was symptomatic showed hemoglobin around 10.  She is not on any blood thinning medication.  Review of her last colonoscopy did mention diverticulosis and internal hemorrhoids.  Rectal exam shows external hemorrhoids, fecal occult was done, no stool obtained and this was negative.  Abdomen is soft.  On reevaluation of the patient even with any movement in the bed or standing up at bedside she becomes tachycardic and short of breath.  Given how symptomatic she is, her age and the acute anemia from baseline I believe that she will need more than 1 unit transfusion with repeat blood work and monitoring.  No recent echo to compare to.  Patients evaluation and results requires admission for further treatment and care.  Spoke with hospitalist, reviewed patient's ED course and they accept admission.  Patient agrees with admission plan, offers no new complaints and is stable/unchanged at time of admit.        Final Clinical Impression(s) / ED Diagnoses Final diagnoses:  None    Rx / DC Orders ED Discharge Orders     None         Rozelle Logan, DO 12/30/23 2039

## 2023-12-30 NOTE — H&P (Signed)
 History and Physical    Patient: [name] FIE:[id number] DOB: August 01, 1932 DOA: 12/30/2023 DOS: the patient was seen and examined on 12/30/2023 PCP: [name], MD  Patient coming from: Home  Chief Complaint:  Chief Complaint  Patient presents with   Weakness   HPI: [name] is a 89 y.o. female with medical history significant of essential hypertension, hyperlipidemia, diverticulosis, recurrent anemia requiring hospitalization, internal hemorrhoids, recurrent UTIs who presents with generalized weakness dizziness palpitation nausea and sweating.  Patient also has progressive weakness in the last 2 weeks that has gotten worse in the last 2 days.  She is getting lightheadedness when she walks.  Also exertional dyspnea.  Has had some nausea with decreased appetite.  Patient was seen and evaluated in the ER she denied any melena.  No bright red blood per rectum.  Patient was found to be anemic hemoglobin dropping from 10.7 just 3 days ago to 7.9 today.  Also some leukocytosis.  Her BUN has gone up from 27-65.  Still guaiac was negative in the ER.  Also hyponatremia.  Patient is being admitted with transfusion ordered.  Review of Systems: As mentioned in the history of present illness. All other systems reviewed and are negative. Past Medical History:  Diagnosis Date   Constipation    Diverticulosis    Hypertension    Inguinal hernia, left    Irritable bowel syndrome    Osteoporosis    UTI (lower urinary tract infection)    hx of frequent uti's   Past Surgical History:  Procedure Laterality Date   APPENDECTOMY     BREAST SURGERY Left    biopsy   CERVICAL FUSION     colon polyps     FINGER SURGERY     HEMORRHOID SURGERY     HIP SURGERY Right    INGUINAL HERNIA REPAIR Left 01/13/2017   Procedure: LEFT INGUINAL HERNIA REPAIR WITH MESH;  Surgeon: Avel Peace, MD;  Location: [facility];  Service: General;  Laterality: Left;   INSERTION OF  MESH Left 01/13/2017   Procedure: INSERTION OF MESH;  Surgeon: Avel Peace, MD;  Location: [facility];  Service: General;  Laterality: Left;   LUMBAR FUSION     LUMBAR LAMINECTOMY     partial resection of colon for tumor     SHOULDER SURGERY Left    TOTAL HIP ARTHROPLASTY     right   TOTAL HIP REVISION Right 11/17/2020   Procedure: ORIF of Right periprosthetic fracture;  Surgeon: Durene Romans, MD;  Location: [facility];  Service: Orthopedics;  Laterality: Right;   Social History:  reports that she has never smoked. She has never used smokeless tobacco. She reports that she does not drink alcohol and does not use drugs.  Allergies  Allergen Reactions   Codeine Other (See Comments)    Headaches   Flagyl [Metronidazole Hcl] Nausea Only    Family History  Problem Relation Age of Onset   Colon cancer Mother    Cancer Father    Lung cancer Father    Ovarian cancer Sister    Breast cancer Sister    Liver cancer Brother    Heart disease Brother     Prior to Admission medications   Medication Sig Start Date End Date Taking? Authorizing Provider  Artificial Tear Solution (GENTEAL TEARS) 0.1-0.2-0.3 % SOLN Place 1 drop into both eyes at bedtime as needed (for dryness).   Yes [provider]  bisoprolol-hydrochlorothiazide (ZIAC) 5-6.25 MG  tablet TAKE 1 TABLET BY MOUTH EVERY DAY 12/26/23  Yes Burchette, Elberta Fortis, MD  Calcium-Vitamin D-Vitamin K (VIACTIV CALCIUM PLUS D) 650-12.5-40 MG-MCG-MCG CHEW As directed Patient taking differently: Chew 1-2 tablets by mouth See admin instructions. Chew 1-2 squares by mouth once a day 11/09/23  Yes Burchette, Elberta Fortis, MD  feeding supplement (BOOST HIGH PROTEIN) LIQD Take 1 Container by mouth daily.   Yes [provider]  gabapentin (NEURONTIN) 100 MG capsule Take 1 capsule (100 mg total) by mouth at bedtime. 12/27/23  Yes Burchette, Elberta Fortis, MD  Glucosamine-Chondroitin 250-200 MG TABS Take 1 tablet by mouth daily. 11/09/23   Yes Burchette, Elberta Fortis, MD  hydrocortisone-pramoxine (ANALPRAM HC) 2.5-1 % rectal cream Place 1 application. rectally 3 (three) times daily. Patient taking differently: Place 1 application  rectally 2 (two) times daily as needed for hemorrhoids. 02/08/22  Yes Burchette, Elberta Fortis, MD  Multiple Vitamin (MULTIVITAMIN WITH MINERALS) TABS tablet Take 1 tablet by mouth 3 (three) times a week.   Yes [provider]  Omega-3 Fatty Acids (FISH OIL) 1000 MG CAPS Take 2 capsules (2,000 mg total) by mouth 2 (two) times daily. Patient taking differently: Take 1,000 mg by mouth daily with breakfast. 11/09/23  Yes Burchette, Elberta Fortis, MD  TYLENOL 325 MG tablet Take 325-650 mg by mouth every 6 (six) hours as needed for mild pain (pain score 1-3) or headache.   Yes [provider]  TYLENOL PM EXTRA STRENGTH 500-25 MG TABS tablet Take 1 tablet by mouth at bedtime as needed (for pain or sleep).   Yes [provider]  Vitamin D, Ergocalciferol, (DRISDOL) 1.25 MG (50000 UNIT) CAPS capsule Take 1 capsule (50,000 Units total) by mouth every 7 (seven) days. Patient taking differently: Take 50,000 Units by mouth every Friday. 11/09/23  Yes Burchette, Elberta Fortis, MD  hydrocortisone (ANUSOL-HC) 2.5 % rectal cream Place 1 application. rectally 2 (two) times daily. Patient not taking: Reported on 12/30/2023 02/10/22   [name], MD    Physical Exam: Vitals:   12/30/23 1556 12/30/23 1901 12/30/23 2144 12/30/23 2233  BP:  129/63 135/79 125/76  Pulse:  87 88 94  Resp:  20 18 18   Temp:  98.2 F (36.8 C) 97.6 F (36.4 C) 97.8 F (36.6 C)  TempSrc:  Oral Oral Oral  SpO2:  97% 98% 98%  Weight: 53.9 kg  57.8 kg   Height: 5\' 1"  (1.549 m)  5\' 5"  (1.651 m)    Constitutional: Acutely ill looking, NAD, calm, comfortable Eyes: PERRL, lids and conjunctivae pale ENMT: Mucous membranes are moist. Posterior pharynx clear of any exudate or lesions.Normal dentition.  Neck: normal, supple, no masses, no  thyromegaly Respiratory: clear to auscultation bilaterally, no wheezing, no crackles. Normal respiratory effort. No accessory muscle use.  Cardiovascular: Regular rate and rhythm, no murmurs / rubs / gallops. No extremity edema. 2+ pedal pulses. No carotid bruits.  Abdomen: no tenderness, no masses palpated. No hepatosplenomegaly. Bowel sounds positive.  Musculoskeletal: Good range of motion, no joint swelling or tenderness, Skin: no rashes, lesions, ulcers. No induration Neurologic: CN 2-12 grossly intact. Sensation intact, DTR normal. Strength 5/5 in all 4.  Psychiatric: Normal judgment and insight. Alert and oriented x 3. Normal mood  Data Reviewed:  Vitals largely stable.  Glucose 147, white count 14.0 hemoglobin 7.9.  Sodium 134 BUN 65 fecal occult blood testing negative x 2  Assessment and Plan:  #1 symptomatic anemia: Suspected slow GI bleed.  Patient has history of  diverticular disease as well as hemorrhoids.  Also polyps.  At this point due to symptoms we will treat have with 2 units of packed red blood cells.  Serial H&H thereafter.  Close monitoring.  Will consider GI consultation if hemoglobin drops further.  #2 hyponatremia: Hydrate and monitor sodium level.  #3 essential hypertension: Will resume home regimen.  #4 hyperlipidemia: Resume home regimen.    Advance Care Planning:   Code Status: Full Code   Consults: None but may consider GI consult if indicated  Family Communication: No family at bedside  Severity of Illness: The appropriate patient status for this patient is OBSERVATION. Observation status is judged to be reasonable and necessary in order to provide the required intensity of service to ensure the patient's safety. The patient's presenting symptoms, physical exam findings, and initial radiographic and laboratory data in the context of their medical condition is felt to place them at decreased risk for further clinical deterioration. Furthermore, it is  anticipated that the patient will be medically stable for discharge from the hospital within 2 midnights of admission.   AuthorLonia Blood, MD 12/30/2023 10:33 PM  For on call review [email or web address].

## 2023-12-30 NOTE — ED Triage Notes (Addendum)
 Pt reports with dizziness and weakness for the past 2 weeks. Pt reports having a dr's appt this past Tuesday. Pt states that her ears hurt when she lies down.

## 2023-12-30 NOTE — Telephone Encounter (Signed)
 The patient was contacted, spoke with patient's husband Mr. Stephannie Peters.  Dr. Bari Edward instructed that the patient due to her age, fragility and symptoms of syncope, best evaluated at an emergency room.     Patient's husband verbalized understanding and will be taking her to the emergency room.

## 2023-12-30 NOTE — Telephone Encounter (Signed)
 Copied from CRM [id number]. Topic: Clinical - Red Word Triage >> Dec 30, 2023  2:18 PM Sim Boast F wrote: Red Word that prompted transfer to Nurse Triage: Patient received a prolia injection yesterday and has been feeling weak, dizzy and almost passed out yesterday  Chief Complaint: weakness Symptoms: 112 systolic, Dizzy, pale in color, feel faint, nausea, sweats Frequency:x 2weeks Pertinent Negatives: Patient denies OB, chest pain Disposition: [] ED /[] Urgent Care (no appt availability in office) / [x] Appointment(In office/virtual)/ []  [place] [facility]/ [] Home Care/ [] Refused Recommended Disposition /[] [place] [facility]/ []  Follow-up with PCP Additional Notes: daughter called and stated pt extremely tired, fatigued, weak, dizzy and unsteady on feet, low appetite.  Pt stated does not want to go to ER.  In office appt scheduled with provider at different location  Reason for Disposition  [1] MODERATE weakness (i.e., interferes with work, school, normal activities) AND [2] persists > 3 days  Answer Assessment - Initial Assessment Questions 1. DESCRIPTION: "Describe how you are feeling."     Tired, fatigue, weakness 2. SEVERITY: "How bad is it?"  "Can you stand and walk?"   - MILD (0-3): Feels weak or tired, but does not interfere with work, school or normal activities.   - MODERATE (4-7): Able to stand and walk; weakness interferes with work, school, or normal activities.   - SEVERE (8-10): Unable to stand or walk; unable to do usual activities.     severe 3. ONSET: "When did these symptoms begin?" (e.g., hours, days, weeks, months)     X 2 weeks and worsening 4. CAUSE: "What do you think is causing the weakness or fatigue?" (e.g., not drinking enough fluids, medical problem, trouble sleeping)     Drinks boost, coffee, milk, water 5. NEW MEDICINES:  "Have you started on any new medicines recently?" (e.g., opioid pain medicines, benzodiazepines, muscle relaxants, antidepressants,  antihistamines, neuroleptics, beta blockers)     Injection of prolia 6. OTHER SYMPTOMS: "Do you have any other symptoms?" (e.g., chest pain, fever, cough, SOB, vomiting, diarrhea, bleeding, other areas of pain)     Dizzy, pale in color, feel faint, nausea, sweats 7. PREGNANCY: "Is there any chance you are pregnant?" "When was your last menstrual period?"     N/a  Protocols used: Weakness (Generalized) and Fatigue-A-AH

## 2023-12-31 DIAGNOSIS — D649 Anemia, unspecified: Secondary | ICD-10-CM | POA: Diagnosis not present

## 2023-12-31 LAB — COMPREHENSIVE METABOLIC PANEL
ALT: 11 U/L (ref 0–44)
AST: 21 U/L (ref 15–41)
Albumin: 2.9 g/dL — ABNORMAL LOW (ref 3.5–5.0)
Alkaline Phosphatase: 30 U/L — ABNORMAL LOW (ref 38–126)
Anion gap: 8 (ref 5–15)
BUN: 52 mg/dL — ABNORMAL HIGH (ref 8–23)
CO2: 21 mmol/L — ABNORMAL LOW (ref 22–32)
Calcium: 7.8 mg/dL — ABNORMAL LOW (ref 8.9–10.3)
Chloride: 105 mmol/L (ref 98–111)
Creatinine, Ser: 0.68 mg/dL (ref 0.44–1.00)
GFR, Estimated: >60 mL/min (ref >60)
Glucose, Bld: 102 mg/dL — ABNORMAL HIGH (ref 70–99)
Potassium: 3.6 mmol/L (ref 3.5–5.1)
Sodium: 134 mmol/L — ABNORMAL LOW (ref 135–145)
Total Bilirubin: 1.3 mg/dL — ABNORMAL HIGH (ref 0.0–1.2)
Total Protein: 5.4 g/dL — ABNORMAL LOW (ref 6.5–8.1)

## 2023-12-31 LAB — CBC
HCT: 30 % — ABNORMAL LOW (ref 36.0–46.0)
HCT: 33.5 % — ABNORMAL LOW (ref 36.0–46.0)
Hemoglobin: 10 g/dL — ABNORMAL LOW (ref 12.0–15.0)
Hemoglobin: 10.6 g/dL — ABNORMAL LOW (ref 12.0–15.0)
MCH: 29.8 pg (ref 26.0–34.0)
MCH: 29.7 pg (ref 26.0–34.0)
MCHC: 33.3 g/dL (ref 30.0–36.0)
MCHC: 31.6 g/dL (ref 30.0–36.0)
MCV: 89.3 fL (ref 80.0–100.0)
MCV: 93.8 fL (ref 80.0–100.0)
Platelets: 167 10*3/uL (ref 150–400)
Platelets: 171 10*3/uL (ref 150–400)
RBC: 3.36 MIL/uL — ABNORMAL LOW (ref 3.87–5.11)
RBC: 3.57 MIL/uL — ABNORMAL LOW (ref 3.87–5.11)
RDW: 14.8 % (ref 11.5–15.5)
RDW: 15 % (ref 11.5–15.5)
WBC: 10.4 10*3/uL (ref 4.0–10.5)
WBC: 10.5 10*3/uL (ref 4.0–10.5)
nRBC: 0 % (ref 0.0–0.2)
nRBC: 0 % (ref 0.0–0.2)

## 2023-12-31 NOTE — Plan of Care (Signed)

## 2023-12-31 NOTE — Progress Notes (Signed)
 PROGRESS NOTE    [name]  ZHY:[id number] DOB: 1932-06-29 DOA: 12/30/2023 PCP: [name], MD   Brief Narrative:  This 89 y.o. female with medical history significant of essential hypertension, hyperlipidemia, diverticulosis, recurrent anemia requiring hospitalization, internal hemorrhoids, recurrent UTIs who presents with generalized weakness,  dizziness,  palpitations,  nausea and sweating.  Patient also has progressive weakness in the last 2 weeks that has gotten worse in the last 2 days.  She is getting lightheadedness when she walks.  She also reports exertional dyspnea.  Has had some nausea with decreased appetite.  Patient was seen and evaluated in the ER , She denieS any melena.  No bright red blood per rectum.  Patient was found to be anemic,  hemoglobin dropping from 10.7 just 3 days ago to 7.9 today.  Also some leukocytosis.  Her BUN has gone up from 27-65.  Still guaiac was negative in the ER. Patient is being admitted for symptomatic anemia requiring blood transfusion.  Assessment & Plan:   Principal Problem:   Symptomatic anemia Active Problems:   Hyperlipidemia   Essential hypertension   DIVERTICULOSIS, COLON   Hyponatremia  Symptomatic anemia:  Patient presented with fatigue, malaise, shortness of breath on exertion. Her hemoglobin has dropped to 7.5 from 10.2  3 days ago. Suspected slow GI bleed.  Patient has history of diverticular disease as well as hemorrhoids. Hemoglobin 7.4, transfused 2 units PRBC.  Hemoglobin improved to 10.0 Stool for occult blood negative, monitor H&H. Will consider GI consultation if hemoglobin drops further. PT and OT evaluation.   Hyponatremia: Hydrate and monitor sodium level.   Essential hypertension: Blood Pressure is better controlled off medicine.   Hyperlipidemia: Resume home regimen.    DVT prophylaxis: SCDs Code Status: Full code Family Communication: No family at bed side Disposition Plan:    Status is:  Inpatient Remains inpatient appropriate because: Admitted for symptomatic anemia    Consultants:  None  Procedures: None  Antimicrobials:  Anti-infectives (From admission, onward)    None       Subjective: Patient seen and examined at bedside.  Overnight events noted. Patient reports feeling very tired,  fatigued, also reports shortness of breath with exertion. H&H improved.   Objective: Vitals:   12/30/23 2250 12/31/23 0159 12/31/23 0236 12/31/23 0521  BP: 123/63 131/69 123/66 125/67  Pulse: 87 85 83 98  Resp: 18 19 19 16   Temp: 97.8 F (36.6 C) 97.8 F (36.6 C) 97.9 F (36.6 C) (!) 97.3 F (36.3 C)  TempSrc:  Oral  Oral  SpO2:  98%  96%  Weight:      Height:        Intake/Output Summary (Last 24 hours) at 12/31/2023 1256 Last data filed at 12/31/2023 0913 Gross per 24 hour  Intake 990.82 ml  Output 1 ml  Net 989.82 ml   Filed Weights   12/30/23 1556 12/30/23 2144  Weight: 53.9 kg 57.8 kg    Examination:  General exam: Appears calm and comfortable, deconditioned, not in any acute distress. Respiratory system: Clear to auscultation. Respiratory effort normal. RR15 Cardiovascular system: S1 & S2 heard, RRR. No JVD, murmurs, rubs, gallops or clicks.  Gastrointestinal system: Abdomen is non distended, soft and non tender.  Normal bowel sounds heard. Central nervous system: Alert and oriented x 3. No focal neurological deficits. Extremities: No edema, no cyanosis, no clubbing Skin: No rashes, lesions or ulcers Psychiatry: Judgement and insight appear normal. Mood & affect appropriate.  Data Reviewed: I have personally reviewed following labs and imaging studies  CBC: Recent Labs  Lab 12/27/23 1545 12/30/23 1604 12/31/23 0737  WBC 9.6 14.0* 10.4  NEUTROABS 6.3  --   --   HGB 10.7* 7.9* 10.0*  HCT 32.4* 24.8* 30.0*  MCV 93.8 96.9 89.3  PLT 243.0 236 167   Basic Metabolic Panel: Recent Labs  Lab 12/27/23 1545 12/30/23 1604 12/31/23 0737  NA  131* 134* 134*  K 4.2 3.9 3.6  CL 96 103 105  CO2 26 20* 21*  GLUCOSE 94 147* 102*  BUN 27* 65* 52*  CREATININE 0.82 0.79 0.68  CALCIUM 9.5 8.4* 7.8*   GFR: Estimated Creatinine Clearance: 42.9 mL/min (by C-G formula based on SCr of 0.68 mg/dL). Liver Function Tests: Recent Labs  Lab 12/27/23 1545 12/31/23 0737  AST 22 21  ALT 11 11  ALKPHOS 48 30*  BILITOT 0.4 1.3*  PROT 7.0 5.4*  ALBUMIN 4.1 2.9*   No results for input(s): "LIPASE", "AMYLASE" in the last 168 hours. No results for input(s): "AMMONIA" in the last 168 hours. Coagulation Profile: No results for input(s): "INR", "PROTIME" in the last 168 hours. Cardiac Enzymes: No results for input(s): "CKTOTAL", "CKMB", "CKMBINDEX", "TROPONINI" in the last 168 hours. BNP (last 3 results) No results for input(s): "PROBNP" in the last 8760 hours. HbA1C: No results for input(s): "HGBA1C" in the last 72 hours. CBG: Recent Labs  Lab 12/30/23 1601  GLUCAP 135*   Lipid Profile: No results for input(s): "CHOL", "HDL", "LDLCALC", "TRIG", "CHOLHDL", "LDLDIRECT" in the last 72 hours. Thyroid Function Tests: No results for input(s): "TSH", "T4TOTAL", "FREET4", "T3FREE", "THYROIDAB" in the last 72 hours. Anemia Panel: No results for input(s): "VITAMINB12", "FOLATE", "FERRITIN", "TIBC", "IRON", "RETICCTPCT" in the last 72 hours. Sepsis Labs: No results for input(s): "PROCALCITON", "LATICACIDVEN" in the last 168 hours.  No results found for this or any previous visit (from the past 240 hours).   Radiology Studies: No results found.  Scheduled Meds:  feeding supplement  1 Container Oral TID BM   Continuous Infusions:  dextrose 5% lactated ringers 40 mL/hr at 12/31/23 0913     LOS: 1 day    Time spent: 50 mins    Willeen Niece, MD [facility]   If 7PM-7AM, please contact night-coverage

## 2024-01-01 DIAGNOSIS — D649 Anemia, unspecified: Secondary | ICD-10-CM | POA: Diagnosis not present

## 2024-01-01 LAB — HEMOGLOBIN AND HEMATOCRIT, BLOOD
HCT: 30.1 % — ABNORMAL LOW (ref 36.0–46.0)
Hemoglobin: 9.5 g/dL — ABNORMAL LOW (ref 12.0–15.0)

## 2024-01-01 NOTE — Discharge Summary (Signed)
 Physician Discharge Summary  [name] ZOX:[id number] DOB: May 27, 1932 DOA: 12/30/2023  PCP: [name], MD  Admit date: 12/30/2023  Discharge date: 01/01/2024  Admitted From: Home  Disposition:  Home  Recommendations for Outpatient Follow-up:  Follow up with PCP in 1-2 weeks. Please obtain BMP/CBC in one week. Advised to follow-up with oncology/ Hematology as scheduled. Patient admitted with anemia but workup has been negative. Needs workup  Home Health:None Equipment/Devices:None  Discharge Condition: Stable CODE STATUS: Full code Diet recommendation: Heart Healthy   [facility] Course: This 89 y.o. female with medical history significant of essential hypertension, hyperlipidemia, diverticulosis, recurrent anemia requiring hospitalization, internal hemorrhoids, recurrent UTIs who presents with generalized weakness,  dizziness,  palpitations,  nausea and sweating.  Patient also has progressive weakness in the last 2 weeks that has gotten worse in the last 2 days.  She is getting lightheadedness when she walks.  She also reports exertional dyspnea.Has had some nausea with decreased appetite.  Patient was seen and evaluated in the ER , She denies any melena.  No bright red blood per rectum. Patient was found to be anemic,  hemoglobin dropping from 10.7 just 3 days ago to 7.9 today.  Also some leukocytosis.  Her BUN has gone up from 27-65. Still guaiac was negative in the ER. Patient was admitted for symptomatic anemia requiring blood transfusion. Patient has received so far 2 units of PRBC.  Hemoglobin improved to 10.0.  Patient reports dizziness has resolved.  Since stool for occult blood was negative. Patient need hematology workup.  Patient wants to be discharged.  Patient being discharged home,  advised follow-up with hematology.  Discharge Diagnoses:  Principal Problem:   Symptomatic anemia Active Problems:   Hyperlipidemia   Essential hypertension    DIVERTICULOSIS, COLON   Hyponatremia  Symptomatic anemia:  Patient presented with fatigue, malaise, shortness of breath on exertion. Her hemoglobin has dropped to 7.5 from 10.2  3 days ago. Suspected slow GI bleed.  Patient has history of diverticular disease as well as hemorrhoids. Hemoglobin 7.4, transfused 2 units PRBC.  Hemoglobin improved to 10.0 Stool for occult blood negative, monitor H&H. Will consider GI consultation if hemoglobin drops further. Patient reports dizziness has resolved.  Wants  to be discharged home.   Hyponatremia: Hydrate and monitor sodium level.   Essential hypertension: Blood Pressure is better controlled,  off medicine.   Hyperlipidemia: Resume home regimen.  Discharge Instructions  Discharge Instructions     Call MD for:  difficulty breathing, headache or visual disturbances   Complete by: As directed    Call MD for:  persistant dizziness or light-headedness   Complete by: As directed    Call MD for:  persistant nausea and vomiting   Complete by: As directed    Diet - low sodium heart healthy   Complete by: As directed    Diet Carb Modified   Complete by: As directed    Discharge instructions   Complete by: As directed    Advised to follow-up with primary care physician in 1 week. Advised to follow-up with oncology/ hematology as scheduled. Patient admitted with anemia but is supratherapeutic negative.  Needs workup   Increase activity slowly   Complete by: As directed       Allergies as of 01/01/2024       Reactions   Codeine Other (See Comments)   Headaches   Flagyl [metronidazole Hcl] Nausea Only        Medication List  STOP taking these medications    hydrocortisone 2.5 % rectal cream Commonly known as: Anusol-HC   hydrocortisone-pramoxine 2.5-1 % rectal cream Commonly known as: Analpram HC       TAKE these medications    bisoprolol-hydrochlorothiazide 5-6.25 MG tablet Commonly known as: ZIAC TAKE 1 TABLET BY  MOUTH EVERY DAY   feeding supplement Liqd Take 1 Container by mouth daily.   Fish Oil 1000 MG Caps Take 2 capsules (2,000 mg total) by mouth 2 (two) times daily. What changed:  how much to take when to take this   gabapentin 100 MG capsule Commonly known as: NEURONTIN Take 1 capsule (100 mg total) by mouth at bedtime.   GenTeal Tears 0.1-0.2-0.3 % Soln Place 1 drop into both eyes at bedtime as needed (for dryness).   Glucosamine-Chondroitin 250-200 MG Tabs Take 1 tablet by mouth daily.   multivitamin with minerals Tabs tablet Take 1 tablet by mouth 3 (three) times a week.   Tylenol 325 MG tablet Generic drug: acetaminophen Take 325-650 mg by mouth every 6 (six) hours as needed for mild pain (pain score 1-3) or headache.   Tylenol PM Extra Strength 500-25 MG Tabs tablet Generic drug: diphenhydramine-acetaminophen Take 1 tablet by mouth at bedtime as needed (for pain or sleep).   Viactiv Calcium Plus D 650-12.5-40 MG-MCG Chew Generic drug: Calcium-Vitamin D-Vitamin K As directed What changed:  how much to take how to take this when to take this additional instructions   Vitamin D (Ergocalciferol) 1.25 MG (50000 UNIT) Caps capsule Commonly known as: DRISDOL Take 1 capsule (50,000 Units total) by mouth every 7 (seven) days. What changed: when to take this        Follow-up Information     [name], MD Follow up in 1 week(s).   Specialty: Family Medicine Contact information: [address] [place] [address] [phone]         Artis Delay, MD Follow up in 1 week(s).   Specialty: Hematology and Oncology Contact information: [address] [place] [address] [phone]                Allergies  Allergen Reactions   Codeine Other (See Comments)    Headaches   Flagyl [Metronidazole Hcl] Nausea Only    Consultations: None   Procedures/Studies: No results found.   Subjective: Patient was seen  and examined at bedside. Overnight events noted.   Patient reports doing much better,  wants to be discharged.  Dizziness has resolved.  Discharge Exam: Vitals:   01/01/24 0517 01/01/24 0910  BP: 137/75 137/83  Pulse: 89 95  Resp: 18   Temp: 97.8 F (36.6 C)   SpO2: 97% 95%   Vitals:   12/31/23 1324 12/31/23 2058 01/01/24 0517 01/01/24 0910  BP: 122/74 133/65 137/75 137/83  Pulse: 86 90 89 95  Resp: 18 18 18    Temp: 98.2 F (36.8 C) 97.8 F (36.6 C) 97.8 F (36.6 C)   TempSrc: Oral Oral Oral   SpO2: 98% 96% 97% 95%  Weight:      Height:        General: Pt is alert, awake, not in acute distress Cardiovascular: RRR, S1/S2 +, no rubs, no gallops Respiratory: CTA bilaterally, no wheezing, no rhonchi Abdominal: Soft, NT, ND, bowel sounds + Extremities: no edema, no cyanosis    The results of significant diagnostics from this hospitalization (including imaging, microbiology, ancillary and laboratory) are listed below for reference.     Microbiology:  No results found for this or any previous visit (from the past 240 hours).   Labs: BNP (last 3 results) No results for input(s): "BNP" in the last 8760 hours. Basic Metabolic Panel: Recent Labs  Lab 12/27/23 1545 12/30/23 1604 12/31/23 0737  NA 131* 134* 134*  K 4.2 3.9 3.6  CL 96 103 105  CO2 26 20* 21*  GLUCOSE 94 147* 102*  BUN 27* 65* 52*  CREATININE 0.82 0.79 0.68  CALCIUM 9.5 8.4* 7.8*   Liver Function Tests: Recent Labs  Lab 12/27/23 1545 12/31/23 0737  AST 22 21  ALT 11 11  ALKPHOS 48 30*  BILITOT 0.4 1.3*  PROT 7.0 5.4*  ALBUMIN 4.1 2.9*   No results for input(s): "LIPASE", "AMYLASE" in the last 168 hours. No results for input(s): "AMMONIA" in the last 168 hours. CBC: Recent Labs  Lab 12/27/23 1545 12/30/23 1604 12/31/23 0737 12/31/23 1219 01/01/24 0610  WBC 9.6 14.0* 10.4 10.5  --   NEUTROABS 6.3  --   --   --   --   HGB 10.7* 7.9* 10.0* 10.6* 9.5*  HCT 32.4* 24.8* 30.0* 33.5* 30.1*   MCV 93.8 96.9 89.3 93.8  --   PLT 243.0 236 167 171  --    Cardiac Enzymes: No results for input(s): "CKTOTAL", "CKMB", "CKMBINDEX", "TROPONINI" in the last 168 hours. BNP: Invalid input(s): "POCBNP" CBG: Recent Labs  Lab 12/30/23 1601  GLUCAP 135*   D-Dimer No results for input(s): "DDIMER" in the last 72 hours. Hgb A1c No results for input(s): "HGBA1C" in the last 72 hours. Lipid Profile No results for input(s): "CHOL", "HDL", "LDLCALC", "TRIG", "CHOLHDL", "LDLDIRECT" in the last 72 hours. Thyroid function studies No results for input(s): "TSH", "T4TOTAL", "T3FREE", "THYROIDAB" in the last 72 hours.  Invalid input(s): "FREET3" Anemia work up No results for input(s): "VITAMINB12", "FOLATE", "FERRITIN", "TIBC", "IRON", "RETICCTPCT" in the last 72 hours. Urinalysis    Component Value Date/Time   COLORURINE YELLOW 12/30/2023 1552   APPEARANCEUR CLEAR 12/30/2023 1552   LABSPEC 1.018 12/30/2023 1552   PHURINE 5.0 12/30/2023 1552   GLUCOSEU NEGATIVE 12/30/2023 1552   HGBUR NEGATIVE 12/30/2023 1552   HGBUR negative 12/02/2009 1439   BILIRUBINUR NEGATIVE 12/30/2023 1552   BILIRUBINUR negative 07/12/2022 1144   KETONESUR NEGATIVE 12/30/2023 1552   PROTEINUR NEGATIVE 12/30/2023 1552   UROBILINOGEN 0.2 07/12/2022 1144   UROBILINOGEN 0.2 12/02/2009 1439   NITRITE NEGATIVE 12/30/2023 1552   LEUKOCYTESUR NEGATIVE 12/30/2023 1552   Sepsis Labs Recent Labs  Lab 12/27/23 1545 12/30/23 1604 12/31/23 0737 12/31/23 1219  WBC 9.6 14.0* 10.4 10.5   Microbiology No results found for this or any previous visit (from the past 240 hours).   Time coordinating discharge: Over 30 minutes  SIGNED:   Willeen Niece, MD  [facility] 01/01/2024, 12:45 PM Pager   If 7PM-7AM, please contact night-coverage

## 2024-01-01 NOTE — Discharge Instructions (Signed)
 Advised to follow-up with primary care physician in 1 week. Advised to follow-up with oncology/ hematology as scheduled. Patient admitted with anemia but is supratherapeutic negative.  Needs workup

## 2024-01-01 NOTE — Plan of Care (Signed)

## 2024-01-01 NOTE — Evaluation (Signed)
 Physical Therapy Brief Evaluation and Discharge Note Patient Details Name: KARALYNE NUSSER MRN: [id number] DOB: [birth year] Today's Date: 01/01/2024   History of Present Illness  89 yo female admitted with anemia, weakness. Hx of anemia, lumbar lamincectomy/fusion, Afib, osteoporosis, R hip ORIF 2022, essential HTN  Clinical Impression  On eval, pt was Mod Ind with mobility. She walked ~150 feet with her straight cane. No LOB during session. Pt denied dizziness. She is hopeful to d/c home today. She politely declines any PT f/u. 1x eval.        PT Assessment    Assistance Needed at Discharge       Equipment Recommendations None recommended by PT  Recommendations for Other Services       Precautions/Restrictions Precautions Precautions: Fall Restrictions Weight Bearing Restrictions Per Provider Order: No        Mobility  Bed Mobility Supine to Sit: Mod Independent         Transfers Overall transfer level: Modified independent                      Ambulation/Gait Ambulation/Gait assistance: Modified independent (Device/Increase time) Gait Distance (Feet): 150 Feet Assistive device: Straight cane Gait Pattern/deviations: Step-through pattern   General Gait Details: Slow gait speed. No LOB.  Home Activity Instructions    Stairs            Modified Rankin (Stroke Patients Only)        Balance                          Pertinent Vitals/Pain   Pain Assessment Pain Assessment: No/denies pain     Home Living   Living Arrangements: Spouse/significant other       Home Equipment: Cane - single Librarian, academic (2 wheels);BSC/3in1        Prior Function        UE/LE Assessment               Communication   Communication Communication: No apparent difficulties     Cognition         General Comments      Exercises     Assessment/Plan    PT Problem List         PT Visit Diagnosis      No  Skilled PT Patient is modified independent with all activity/mobility   Co-evaluation                AMPAC 6 Clicks Help needed turning from your back to your side while in a flat bed without using bedrails?: None Help needed moving from lying on your back to sitting on the side of a flat bed without using bedrails?: None Help needed moving to and from a bed to a chair (including a wheelchair)?: None Help needed standing up from a chair using your arms (e.g., wheelchair or bedside chair)?: None Help needed to walk in hospital room?: None Help needed climbing 3-5 steps with a railing? : A Little 6 Click Score: 23      End of Session Equipment Utilized During Treatment: Gait belt Activity Tolerance: Patient tolerated treatment well Patient left: in bed;with call bell/phone within reach         Time: 1123-1133 PT Time Calculation (min) (ACUTE ONLY): 10 min  Charges:   PT Evaluation $PT Eval Low Complexity: 1 Low          Rikita Grabert P, PT Acute Rehabilitation  Office: [phone]

## 2024-01-02 ENCOUNTER — Other Ambulatory Visit: Payer: Self-pay

## 2024-01-02 ENCOUNTER — Observation Stay (HOSPITAL_COMMUNITY)

## 2024-01-02 ENCOUNTER — Encounter (HOSPITAL_COMMUNITY): Payer: Self-pay | Admitting: Internal Medicine

## 2024-01-02 ENCOUNTER — Inpatient Hospital Stay (HOSPITAL_COMMUNITY)
Admission: EM | Admit: 2024-01-02 | Discharge: 2024-01-05 | DRG: 378 | Disposition: A | Discharge status: Home / Self Care | Attending: Internal Medicine | Admitting: Internal Medicine

## 2024-01-02 ENCOUNTER — Telehealth: Payer: Self-pay | Admitting: *Deleted

## 2024-01-02 DIAGNOSIS — K259 Gastric ulcer, unspecified as acute or chronic, without hemorrhage or perforation: Secondary | ICD-10-CM | POA: Diagnosis not present

## 2024-01-02 DIAGNOSIS — K921 Melena: Secondary | ICD-10-CM | POA: Diagnosis not present

## 2024-01-02 DIAGNOSIS — K2970 Gastritis, unspecified, without bleeding: Secondary | ICD-10-CM | POA: Diagnosis not present

## 2024-01-02 DIAGNOSIS — I10 Essential (primary) hypertension: Secondary | ICD-10-CM | POA: Diagnosis present

## 2024-01-02 DIAGNOSIS — Z9049 Acquired absence of other specified parts of digestive tract: Secondary | ICD-10-CM | POA: Diagnosis not present

## 2024-01-02 DIAGNOSIS — Z96641 Presence of right artificial hip joint: Secondary | ICD-10-CM | POA: Diagnosis present

## 2024-01-02 DIAGNOSIS — Z860100 Personal history of colon polyps, unspecified: Secondary | ICD-10-CM

## 2024-01-02 DIAGNOSIS — N2889 Other specified disorders of kidney and ureter: Secondary | ICD-10-CM | POA: Diagnosis not present

## 2024-01-02 DIAGNOSIS — Z801 Family history of malignant neoplasm of trachea, bronchus and lung: Secondary | ICD-10-CM

## 2024-01-02 DIAGNOSIS — K2971 Gastritis, unspecified, with bleeding: Secondary | ICD-10-CM | POA: Diagnosis present

## 2024-01-02 DIAGNOSIS — G629 Polyneuropathy, unspecified: Secondary | ICD-10-CM | POA: Diagnosis present

## 2024-01-02 DIAGNOSIS — I6782 Cerebral ischemia: Secondary | ICD-10-CM | POA: Diagnosis not present

## 2024-01-02 DIAGNOSIS — D62 Acute posthemorrhagic anemia: Secondary | ICD-10-CM | POA: Diagnosis present

## 2024-01-02 DIAGNOSIS — Z888 Allergy status to other drugs, medicaments and biological substances status: Secondary | ICD-10-CM | POA: Diagnosis not present

## 2024-01-02 DIAGNOSIS — D649 Anemia, unspecified: Principal | ICD-10-CM

## 2024-01-02 DIAGNOSIS — K922 Gastrointestinal hemorrhage, unspecified: Secondary | ICD-10-CM | POA: Diagnosis present

## 2024-01-02 DIAGNOSIS — K254 Chronic or unspecified gastric ulcer with hemorrhage: Secondary | ICD-10-CM | POA: Diagnosis present

## 2024-01-02 DIAGNOSIS — Z8041 Family history of malignant neoplasm of ovary: Secondary | ICD-10-CM | POA: Diagnosis not present

## 2024-01-02 DIAGNOSIS — Z803 Family history of malignant neoplasm of breast: Secondary | ICD-10-CM | POA: Diagnosis not present

## 2024-01-02 DIAGNOSIS — B9681 Helicobacter pylori [H. pylori] as the cause of diseases classified elsewhere: Secondary | ICD-10-CM | POA: Diagnosis present

## 2024-01-02 DIAGNOSIS — N858 Other specified noninflammatory disorders of uterus: Secondary | ICD-10-CM | POA: Diagnosis not present

## 2024-01-02 DIAGNOSIS — Z800 Family history of malignant neoplasm of digestive organs: Secondary | ICD-10-CM | POA: Diagnosis not present

## 2024-01-02 DIAGNOSIS — S0990XA Unspecified injury of head, initial encounter: Secondary | ICD-10-CM | POA: Diagnosis not present

## 2024-01-02 DIAGNOSIS — Z79899 Other long term (current) drug therapy: Secondary | ICD-10-CM

## 2024-01-02 DIAGNOSIS — E785 Hyperlipidemia, unspecified: Secondary | ICD-10-CM | POA: Diagnosis present

## 2024-01-02 DIAGNOSIS — K5730 Diverticulosis of large intestine without perforation or abscess without bleeding: Secondary | ICD-10-CM | POA: Diagnosis present

## 2024-01-02 DIAGNOSIS — K2951 Unspecified chronic gastritis with bleeding: Secondary | ICD-10-CM | POA: Diagnosis not present

## 2024-01-02 DIAGNOSIS — R55 Syncope and collapse: Secondary | ICD-10-CM | POA: Diagnosis present

## 2024-01-02 DIAGNOSIS — Z885 Allergy status to narcotic agent status: Secondary | ICD-10-CM | POA: Diagnosis not present

## 2024-01-02 DIAGNOSIS — C8513 Unspecified B-cell lymphoma, intra-abdominal lymph nodes: Secondary | ICD-10-CM | POA: Diagnosis present

## 2024-01-02 DIAGNOSIS — R531 Weakness: Secondary | ICD-10-CM | POA: Diagnosis not present

## 2024-01-02 DIAGNOSIS — M810 Age-related osteoporosis without current pathological fracture: Secondary | ICD-10-CM | POA: Diagnosis present

## 2024-01-02 DIAGNOSIS — K3189 Other diseases of stomach and duodenum: Secondary | ICD-10-CM | POA: Diagnosis present

## 2024-01-02 DIAGNOSIS — Z8249 Family history of ischemic heart disease and other diseases of the circulatory system: Secondary | ICD-10-CM | POA: Diagnosis not present

## 2024-01-02 DIAGNOSIS — Z981 Arthrodesis status: Secondary | ICD-10-CM | POA: Diagnosis not present

## 2024-01-02 DIAGNOSIS — I959 Hypotension, unspecified: Secondary | ICD-10-CM | POA: Diagnosis not present

## 2024-01-02 DIAGNOSIS — W19XXXA Unspecified fall, initial encounter: Secondary | ICD-10-CM | POA: Diagnosis not present

## 2024-01-02 DIAGNOSIS — C8333 Diffuse large B-cell lymphoma, intra-abdominal lymph nodes: Secondary | ICD-10-CM | POA: Diagnosis not present

## 2024-01-02 LAB — BPAM RBC
Blood Product Expiration Date: 202503302359
Blood Product Expiration Date: 202503312359
ISSUE DATE / TIME: 202503072225
ISSUE DATE / TIME: 202503080216
Unit Type and Rh: 6200
Unit Type and Rh: 6200

## 2024-01-02 LAB — COMPREHENSIVE METABOLIC PANEL
ALT: 13 U/L (ref 0–44)
AST: 21 U/L (ref 15–41)
Albumin: 2.7 g/dL — ABNORMAL LOW (ref 3.5–5.0)
Alkaline Phosphatase: 31 U/L — ABNORMAL LOW (ref 38–126)
Anion gap: 11 (ref 5–15)
BUN: 66 mg/dL — ABNORMAL HIGH (ref 8–23)
CO2: 19 mmol/L — ABNORMAL LOW (ref 22–32)
Calcium: 8.1 mg/dL — ABNORMAL LOW (ref 8.9–10.3)
Chloride: 104 mmol/L (ref 98–111)
Creatinine, Ser: 0.8 mg/dL (ref 0.44–1.00)
GFR, Estimated: >60 mL/min (ref >60)
Glucose, Bld: 139 mg/dL — ABNORMAL HIGH (ref 70–99)
Potassium: 4.2 mmol/L (ref 3.5–5.1)
Sodium: 134 mmol/L — ABNORMAL LOW (ref 135–145)
Total Bilirubin: 0.6 mg/dL (ref 0.0–1.2)
Total Protein: 5.1 g/dL — ABNORMAL LOW (ref 6.5–8.1)

## 2024-01-02 LAB — CBC WITH DIFFERENTIAL/PLATELET
Abs Immature Granulocytes: 0.14 10*3/uL — ABNORMAL HIGH (ref 0.00–0.07)
Basophils Absolute: 0 10*3/uL (ref 0.0–0.1)
Basophils Relative: 0 %
Eosinophils Absolute: 0 10*3/uL (ref 0.0–0.5)
Eosinophils Relative: 0 %
HCT: 20.9 % — ABNORMAL LOW (ref 36.0–46.0)
Hemoglobin: 6.8 g/dL — CL (ref 12.0–15.0)
Immature Granulocytes: 1 %
Lymphocytes Relative: 10 %
Lymphs Abs: 1.5 10*3/uL (ref 0.7–4.0)
MCH: 30.9 pg (ref 26.0–34.0)
MCHC: 32.5 g/dL (ref 30.0–36.0)
MCV: 95 fL (ref 80.0–100.0)
Monocytes Absolute: 0.7 10*3/uL (ref 0.1–1.0)
Monocytes Relative: 5 %
Neutro Abs: 12.9 10*3/uL — ABNORMAL HIGH (ref 1.7–7.7)
Neutrophils Relative %: 84 %
Platelets: 152 10*3/uL (ref 150–400)
RBC: 2.2 MIL/uL — ABNORMAL LOW (ref 3.87–5.11)
RDW: 14.7 % (ref 11.5–15.5)
WBC: 15.3 10*3/uL — ABNORMAL HIGH (ref 4.0–10.5)
nRBC: 0 % (ref 0.0–0.2)

## 2024-01-02 LAB — URINALYSIS, ROUTINE W REFLEX MICROSCOPIC
Bilirubin Urine: NEGATIVE
Glucose, UA: NEGATIVE mg/dL
Ketones, ur: NEGATIVE mg/dL
Leukocytes,Ua: NEGATIVE
Nitrite: NEGATIVE
Protein, ur: NEGATIVE mg/dL
Specific Gravity, Urine: >1.046 — ABNORMAL HIGH (ref 1.005–1.030)
pH: 5 (ref 5.0–8.0)

## 2024-01-02 LAB — PROTIME-INR
INR: 1.2 (ref 0.8–1.2)
Prothrombin Time: 15.5 s — ABNORMAL HIGH (ref 11.4–15.2)

## 2024-01-02 LAB — TYPE AND SCREEN
ABO/RH(D): A POS
Antibody Screen: NEGATIVE
Unit division: 0
Unit division: 0

## 2024-01-02 LAB — CBG MONITORING, ED: Glucose-Capillary: 141 mg/dL — ABNORMAL HIGH (ref 70–99)

## 2024-01-02 LAB — PREPARE RBC (CROSSMATCH)

## 2024-01-02 LAB — TROPONIN I (HIGH SENSITIVITY): Troponin I (High Sensitivity): 8 ng/L (ref <18)

## 2024-01-02 MED ORDER — ONDANSETRON HCL 4 MG PO TABS: 4.0000 mg | ORAL_TABLET | Freq: Four times a day (QID) | ORAL | Status: DC | PRN

## 2024-01-02 MED ORDER — SODIUM CHLORIDE 0.9% FLUSH: 3.0000 mL | INTRAVENOUS | Status: DC | PRN

## 2024-01-02 MED ORDER — ACETAMINOPHEN 650 MG RE SUPP: 650.0000 mg | Freq: Four times a day (QID) | RECTAL | Status: DC | PRN

## 2024-01-02 MED ORDER — ONDANSETRON HCL 4 MG/2ML IJ SOLN: 4.0000 mg | Freq: Four times a day (QID) | INTRAMUSCULAR | Status: DC | PRN

## 2024-01-02 MED ORDER — SODIUM CHLORIDE 0.9% FLUSH
3.0000 mL | Freq: Two times a day (BID) | INTRAVENOUS | Status: DC
Start: 2024-01-02 — End: 2024-01-05
  Administered 2024-01-02: 10 mL via INTRAVENOUS
  Administered 2024-01-02: 3 mL via INTRAVENOUS
  Administered 2024-01-03 – 2024-01-04 (×2): 10 mL via INTRAVENOUS
  Administered 2024-01-04: 3 mL via INTRAVENOUS
  Administered 2024-01-05: 10 mL via INTRAVENOUS

## 2024-01-02 MED ORDER — SODIUM CHLORIDE 0.9% IV SOLUTION: Freq: Once | INTRAVENOUS | Status: DC

## 2024-01-02 MED ORDER — PANTOPRAZOLE SODIUM 40 MG IV SOLR
40.0000 mg | Freq: Two times a day (BID) | INTRAVENOUS | Status: DC
  Administered 2024-01-02 – 2024-01-04 (×4): 40 mg via INTRAVENOUS
  Filled 2024-01-02 (×4): qty 10

## 2024-01-02 MED ORDER — ACETAMINOPHEN 325 MG PO TABS: 650.0000 mg | ORAL_TABLET | Freq: Four times a day (QID) | ORAL | Status: DC | PRN

## 2024-01-02 MED ORDER — HYDRALAZINE HCL 20 MG/ML IJ SOLN: 5.0000 mg | INTRAMUSCULAR | Status: DC | PRN

## 2024-01-02 MED ORDER — IOHEXOL 350 MG/ML SOLN
75.0000 mL | Freq: Once | INTRAVENOUS | Status: AC | PRN
  Administered 2024-01-02: 75 mL via INTRAVENOUS

## 2024-01-02 NOTE — H&P (Addendum)
 History and Physical    Patient: [name] ZOX:[id number] DOB: [birth year] DOA: 01/02/2024 DOS: the patient was seen and examined on 01/02/2024 PCP: [name], MD  Patient coming from: Home Chief complaint: Chief Complaint  Patient presents with   Loss of Consciousness   Weakness   GI Bleeding   HPI:  [name] is a 89 y.o. female with past medical history  of  essential hypertension, hyperlipidemia, diverticulosis, recurrent anemia requiring hospitalization, internal hemorrhoids, recurrent UTIs presenting today with 2 syncopal episodes, without any trauma to the head reported.  Patient reports generalized weakness.  No reports of chest pain palpitations nausea vomiting diarrhea.  No fevers chills.  No reports of melena or hematochezia.  Patient was admitted on 12/30/2023 and discharged on 01/01/2024 With plans of GI evaluation on outpatient basis per ED provider signout.  Discharge summary also notes patient to follow-up with hematology oncology.   >>ED Course: Vital signs in the ED were notable for the following:  Vitals:   01/02/24 1036 01/02/24 1044  BP:  (!) 107/90  Pulse:  85  Temp:  98.1 F (36.7 C)  Resp:  17  SpO2: 96% 97%  Labs were notable for the following:  Cbc with hb of 6.8 and  d/d hb was 9.5, wbc of 15.3 and plt count wnl.  PT 15.5/INR of 1.2 CMP on the eighth shows sodium 134 bicarb 21 BUN 52 calcium 7.8 anion gap 8 total bili of 1.3 normal LFTs.  EKG: Independently reviewed :EKG shows sinus rhythm 92 with PR interval of 190, patient has ST and depression in 2 3 aVF and V3 V4 V5 and V6 concerning for ischemia, QTc is prolonged at 482.  EKG is different from her previous EKG of March 7, as far as the QT is prolonged however the ST wave abnormality is present and almost similar.   >>Imaging and additional notable ED work-up:  CTA GIB: ordered and pending. >>Head ct noncontrast-Pending.  While in the ED, the following were administered:   Medications  0.9 %  sodium chloride infusion (Manually program via Guardrails IV Fluids) (has no administration in time range)  acetaminophen (TYLENOL) tablet 650 mg (has no administration in time range)    Or  acetaminophen (TYLENOL) suppository 650 mg (has no administration in time range)  ondansetron (ZOFRAN) tablet 4 mg (has no administration in time range)    Or  ondansetron (ZOFRAN) injection 4 mg (has no administration in time range)  hydrALAZINE (APRESOLINE) injection 5 mg (has no administration in time range)  sodium chloride flush (NS) 0.9 % injection 3-10 mL (has no administration in time range)  sodium chloride flush (NS) 0.9 % injection 3-10 mL (has no administration in time range)  pantoprazole (PROTONIX) injection 40 mg (has no administration in time range)  Type/ crossed 2 units.   Review of Systems  Constitutional:  Positive for malaise/fatigue.  Neurological:  Positive for dizziness, loss of consciousness and weakness.   Past Medical History:  Diagnosis Date   Constipation    Diverticulosis    Hypertension    Inguinal hernia, left    Irritable bowel syndrome    Osteoporosis    UTI (lower urinary tract infection)    hx of frequent uti's   Past Surgical History:  Procedure Laterality Date   APPENDECTOMY     BREAST SURGERY Left    biopsy   CERVICAL FUSION     colon polyps     FINGER SURGERY  HEMORRHOID SURGERY     HIP SURGERY Right    INGUINAL HERNIA REPAIR Left 01/13/2017   Procedure: LEFT INGUINAL HERNIA REPAIR WITH MESH;  Surgeon: Avel Peace, MD;  Location: [facility];  Service: General;  Laterality: Left;   INSERTION OF MESH Left 01/13/2017   Procedure: INSERTION OF MESH;  Surgeon: Avel Peace, MD;  Location: [facility];  Service: General;  Laterality: Left;   LUMBAR FUSION     LUMBAR LAMINECTOMY     partial resection of colon for tumor     SHOULDER SURGERY Left    TOTAL HIP ARTHROPLASTY     right    TOTAL HIP REVISION Right 11/17/2020   Procedure: ORIF of Right periprosthetic fracture;  Surgeon: Durene Romans, MD;  Location: [facility];  Service: Orthopedics;  Laterality: Right;    reports that she has never smoked. She has never used smokeless tobacco. She reports that she does not drink alcohol and does not use drugs.  Allergies  Allergen Reactions   Codeine Other (See Comments)    Headaches   Flagyl [Metronidazole Hcl] Nausea Only    Family History  Problem Relation Age of Onset   Colon cancer Mother    Cancer Father    Lung cancer Father    Ovarian cancer Sister    Breast cancer Sister    Liver cancer Brother    Heart disease Brother     Prior to Admission medications   Medication Sig Start Date End Date Taking? Authorizing Provider  Artificial Tear Solution (GENTEAL TEARS) 0.1-0.2-0.3 % SOLN Place 1 drop into both eyes at bedtime as needed (for dryness).    [provider]  bisoprolol-hydrochlorothiazide [facility]) 5-6.25 MG tablet TAKE 1 TABLET BY MOUTH EVERY DAY 12/26/23   Burchette, Elberta Fortis, MD  Calcium-Vitamin D-Vitamin K (VIACTIV CALCIUM PLUS D) 650-12.5-40 MG-MCG-MCG CHEW As directed Patient taking differently: Chew 1-2 tablets by mouth See admin instructions. Chew 1-2 squares by mouth once a day 11/09/23   [name], MD  feeding supplement (BOOST HIGH PROTEIN) LIQD Take 1 Container by mouth daily.    [provider]  gabapentin (NEURONTIN) 100 MG capsule Take 1 capsule (100 mg total) by mouth at bedtime. 12/27/23   Burchette, Elberta Fortis, MD  Glucosamine-Chondroitin 250-200 MG TABS Take 1 tablet by mouth daily. 11/09/23   Burchette, Elberta Fortis, MD  Multiple Vitamin (MULTIVITAMIN WITH MINERALS) TABS tablet Take 1 tablet by mouth 3 (three) times a week.    [provider]  Omega-3 Fatty Acids (FISH OIL) 1000 MG CAPS Take 2 capsules (2,000 mg total) by mouth 2 (two) times daily. Patient taking differently: Take 1,000 mg by mouth daily with breakfast.  11/09/23   Burchette, Elberta Fortis, MD  TYLENOL 325 MG tablet Take 325-650 mg by mouth every 6 (six) hours as needed for mild pain (pain score 1-3) or headache.    [provider]  TYLENOL PM EXTRA STRENGTH 500-25 MG TABS tablet Take 1 tablet by mouth at bedtime as needed (for pain or sleep).    [provider]  Vitamin D, Ergocalciferol, (DRISDOL) 1.25 MG (50000 UNIT) CAPS capsule Take 1 capsule (50,000 Units total) by mouth every 7 (seven) days. Patient taking differently: Take 50,000 Units by mouth every Friday. 11/09/23   Burchette, Elberta Fortis, MD  Vitals:   01/02/24 1036 01/02/24 1044  BP:  (!) 107/90  Pulse:  85  Resp:  17  Temp:  98.1 F (36.7 C)  SpO2: 96% 97%   Physical Exam Vitals and nursing note reviewed.  Constitutional:      General: She is not in acute distress. HENT:     Head: Normocephalic and atraumatic.     Right Ear: Hearing normal.     Left Ear: Hearing normal.     Nose: Nose normal. No nasal deformity.     Mouth/Throat:     Lips: Pink.     Tongue: No lesions.     Pharynx: Oropharynx is clear.  Eyes:     General: Lids are normal.     Extraocular Movements: Extraocular movements intact.  Cardiovascular:     Rate and Rhythm: Normal rate and regular rhythm.     Heart sounds: Normal heart sounds.  Pulmonary:     Effort: Pulmonary effort is normal.     Breath sounds: Normal breath sounds.  Abdominal:     General: Bowel sounds are normal. There is no distension.     Palpations: Abdomen is soft. There is no mass.     Tenderness: There is no abdominal tenderness.  Musculoskeletal:     Right lower leg: No edema.     Left lower leg: No edema.  Skin:    General: Skin is warm.  Neurological:     General: No focal deficit present.     Mental Status: She is alert and oriented to person, place, and time.     Cranial Nerves: Cranial nerves 2-12 are intact.  Psychiatric:         Attention and Perception: Attention normal.        Mood and Affect: Mood normal.        Speech: Speech normal.        Behavior: Behavior normal. Behavior is cooperative.      Labs on Admission: I have personally reviewed following labs and imaging studies  CBC: Recent Labs  Lab 12/27/23 1545 12/30/23 1604 12/31/23 0737 12/31/23 1219 01/01/24 0610 01/02/24 1047  WBC 9.6 14.0* 10.4 10.5  --  15.3*  NEUTROABS 6.3  --   --   --   --  12.9*  HGB 10.7* 7.9* 10.0* 10.6* 9.5* 6.8*  HCT 32.4* 24.8* 30.0* 33.5* 30.1* 20.9*  MCV 93.8 96.9 89.3 93.8  --  95.0  PLT 243.0 236 167 171  --  152   Basic Metabolic Panel: Recent Labs  Lab 12/27/23 1545 12/30/23 1604 12/31/23 0737  NA 131* 134* 134*  K 4.2 3.9 3.6  CL 96 103 105  CO2 26 20* 21*  GLUCOSE 94 147* 102*  BUN 27* 65* 52*  CREATININE 0.82 0.79 0.68  CALCIUM 9.5 8.4* 7.8*   GFR: Estimated Creatinine Clearance: 42.9 mL/min (by C-G formula based on SCr of 0.68 mg/dL). Liver Function Tests: Recent Labs  Lab 12/27/23 1545 12/31/23 0737  AST 22 21  ALT 11 11  ALKPHOS 48 30*  BILITOT 0.4 1.3*  PROT 7.0 5.4*  ALBUMIN 4.1 2.9*   No results for input(s): "LIPASE", "AMYLASE" in the last 168 hours. No results for input(s): "AMMONIA" in the last 168 hours. Coagulation Profile: Recent Labs  Lab 01/02/24 1047  INR 1.2   Cardiac Enzymes: No results for input(s): "CKTOTAL", "CKMB", "CKMBINDEX", "TROPONINI" in the last 168 hours. BNP (last 3 results) No results for input(s): "PROBNP" in the last 8760 hours.  HbA1C: No results for input(s): "HGBA1C" in the last 72 hours. CBG: Recent Labs  Lab 12/30/23 1601  GLUCAP 135*   Lipid Profile: No results for input(s): "CHOL", "HDL", "LDLCALC", "TRIG", "CHOLHDL", "LDLDIRECT" in the last 72 hours. Thyroid Function Tests: No results for input(s): "TSH", "T4TOTAL", "FREET4", "T3FREE", "THYROIDAB" in the last 72 hours. Anemia Panel: No results for input(s): "VITAMINB12",  "FOLATE", "FERRITIN", "TIBC", "IRON", "RETICCTPCT" in the last 72 hours. Urine analysis:    Component Value Date/Time   COLORURINE YELLOW 12/30/2023 1552   APPEARANCEUR CLEAR 12/30/2023 1552   LABSPEC 1.018 12/30/2023 1552   PHURINE 5.0 12/30/2023 1552   GLUCOSEU NEGATIVE 12/30/2023 1552   HGBUR NEGATIVE 12/30/2023 1552   HGBUR negative 12/02/2009 1439   BILIRUBINUR NEGATIVE 12/30/2023 1552   BILIRUBINUR negative 07/12/2022 1144   KETONESUR NEGATIVE 12/30/2023 1552   PROTEINUR NEGATIVE 12/30/2023 1552   UROBILINOGEN 0.2 07/12/2022 1144   UROBILINOGEN 0.2 12/02/2009 1439   NITRITE NEGATIVE 12/30/2023 1552   LEUKOCYTESUR NEGATIVE 12/30/2023 1552    Data Reviewed: Relevant notes from primary care and specialist visits, past discharge summaries as available in EHR, including Care Everywhere. Prior diagnostic testing as pertinent to current admission diagnoses, Updated medications and problem lists for reconciliation ED course, including vitals, labs, imaging, treatment and response to treatment,Triage notes, nursing and pharmacy notes and ED provider's notes Notable results as noted in HPI.Discussed case with EDMD/ ED APP/ or Specialty MD on call and as needed.  >>Principal Problem:   Syncope and collapse Active Problems:   Essential hypertension   Acute blood loss anemia (ABLA)   GIB (gastrointestinal bleeding)  >>Syncope/ collapse: 2/2 to ABLA suspect GIB and hypotension , we will monitor pt on tele and aspiration / fall precaution. Head ct is pending. PT eval for ambulation and safety prior to discharge and orthostatic vitals prior to discharge.   >>ABLA/GIB: 2/2 to her omega supplements which reduce platelet aggregation and increase bleeding tendency. CTA to identify any active bleed but also for any structural etiology , colitis etc.  Would cut down regimen to every other day but will defer to PCP for follow up.  NPO. IV PPI/ GI consult.stool guaiac x 3.  PRN  antiemetics. Type and cross 2 units.    >>Essential HTN: Vitals:   01/02/24 1044  BP: (!) 107/90  Holding her Ziac.  DVT prophylaxis:  Scd's  Consults:  GI. Advance Care Planning:    Code Status: Full Code   Family Communication:  None  Disposition Plan:  Home  Severity of Illness: The appropriate patient status for this patient is OBSERVATION. Observation status is judged to be reasonable and necessary in order to provide the required intensity of service to ensure the patient's safety. The patient's presenting symptoms, physical exam findings, and initial radiographic and laboratory data in the context of their medical condition is felt to place them at decreased risk for further clinical deterioration. Furthermore, it is anticipated that the patient will be medically stable for discharge from the hospital within 2 midnights of admission.   Author: Gertha Calkin, MD 01/02/2024 1:33 PM  For on call review [email or web address].   Unresulted Labs (From admission, onward)     Start     Ordered   01/03/24 0500  CBC  Tomorrow morning,   R        01/02/24 1326   01/03/24 0500  Comprehensive metabolic panel  Tomorrow morning,   R        01/02/24 1326  01/02/24 1137  Comprehensive metabolic panel  Once,   STAT        01/02/24 1137   01/02/24 1047  Urinalysis, Routine w reflex microscopic -Urine, Clean Catch  Once,   URGENT       Question:  Specimen Source  Answer:  Urine, Clean Catch   01/02/24 1046            Orders Placed This Encounter  Procedures   CT Head Wo Contrast   CT ANGIO GI BLEED   Urinalysis, Routine w reflex microscopic -Urine, Clean Catch   CBC with Differential   Protime-INR   Comprehensive metabolic panel   CBC   Comprehensive metabolic panel   Diet clear liquid Room service appropriate? Yes; Fluid consistency: Thin   Document Height and Actual Weight   Orthostatic Vital signs   Place X2 Large Bore IV's   Initiate Carrier Fluid Protocol   Informed  Consent Details: Physician/Practitioner Attestation; Transcribe to consent form and obtain patient signature   SCDs   Cardiac Monitoring Continuous x 24 hours Indications for use: Other; other indications for use: Monitor for ischemia   Vital signs   Notify physician (specify)   Mobility Protocol: No Restrictions RN to initiate protocols based on patient's level of care   Refer to Sidebar Report Refer to ICU, Med-Surg, Progressive, and Step-Down Mobility Protocol Sidebars   Initiate Adult Central Line Maintenance and Catheter Protocol for patients with central line (CVC, PICC, Port, Hemodialysis, Trialysis)   If patient diabetic or glucose greater than 140 notify physician for Sliding Scale Insulin Orders   Intake and Output   Do not place and if present remove PureWick   Initiate Oral Care Protocol   Initiate Carrier Fluid Protocol   RN may order General Admission PRN Orders utilizing "General Admission PRN medications" (through manage orders) for the following patient needs: allergy symptoms (Claritin), cold sores (Carmex), cough (Robitussin DM), eye irritation (Liquifilm Tears), hemorrhoids (Tucks), indigestion (Maalox), minor skin irritation (Hydrocortisone Cream), muscle pain Romeo Apple Gay), nose irritation (saline nasal spray) and sore throat (Chloraseptic spray).   Full code   Consult to hospitalist   Consult to gastroenterology Consult Timeframe: ROUTINE - requires response within 24 hours; Reason for Consult? GIB   Pulse oximetry check with vital signs   Oxygen therapy Mode or (Route): Nasal cannula; Liters Per Minute: 2; Keep O2 saturation between: greater than 92 %   CBG monitoring, ED   POC occult blood, ED Provider will collect   ED EKG   Type and screen [facility]   Prepare RBC (crossmatch)   Place in observation (patient's expected length of stay will be less than 2 midnights)   Aspiration precautions   Fall precautions

## 2024-01-02 NOTE — ED Notes (Signed)
 CRITICAL VALUE STICKER  CRITICAL VALUE:Hgb 6.8  RECEIVER (on-site recipient of call):I. Alegria Dominique   DATE & TIME NOTIFIED: 01/02/24 1209  MESSENGER (representative from lab):Lab  MD NOTIFIED: Pfeiffer  TIME OF NOTIFICATION:1210  RESPONSE:

## 2024-01-02 NOTE — Consult Note (Signed)
 Consultation  Referring Provider: TRH/Patel Primary Care Physician:  [name], MD Primary Gastroenterologist:  Dr Kaplan/ remote.  Reason for Consultation:  GI bleed  HPI: [name] is a 89 y.o. female with history of hypertension, hyperlipidemia, previous right hip arthroplasty, status post remote left inguinal hernia repair, and a very remote bowel resection about 50 years ago patient believes may have been related to a polyp. She is known to our group remotely from previous colonoscopies, last colonoscopy was done in 2006 per Dr. Victorino Dike with finding of internal and external hemorrhoids and left colon diverticulosis.  No polyps at that time.  She also underwent a hemorrhoidal banding per Dr. Arlyce Dice in 2011.  Patient was just discharged from [facility] where she was admitted 3/8 through 01/01/2023 with severe anemia with hemoglobin of 7.9,and associated weakness and lightheadedness at that time she denied any abdominal pain, and had not noted any melena or hematochezia.  She had her stool checked in the ER and this was documented as heme-negative. Her hemoglobin had been 10.7 about 1 week ago. Received 2 units of packed RBCs, hemoglobin responded appropriately and she was allowed discharge from the hospital yesterday with plans to follow-up with hematology. Apparently this morning she became very weak and lightheaded and passed out in her bathroom.  Her husband was there so he was able to help her to the floor and she did not hit her head.  She now notes that she has had very black stool over the past 2 days.  She is not a great historian regarding timing of events over the past week or so but does not believe that she had seen any black stool prior to 2 days ago. Did not see any bright red blood, no nausea or vomiting.  She has been having some crampy abdominal pain over the past several days as well.  She is not on any blood thinners, does not take any regular  aspirin or NSAIDs, does take several vitamins including fish oil/omega-3.  Labs today show WBC of 15.3/hemoglobin 6.8/hematocrit 20.9/95/platelets 152 INR 1.2/pro time 15.5 C-Met is pending today Hemoccult pending today  She has just had a CT angio abdomen, report pending.  She has no prior history of GI bleeding.   Past Medical History:  Diagnosis Date   Constipation    Diverticulosis    Hypertension    Inguinal hernia, left    Irritable bowel syndrome    Osteoporosis    UTI (lower urinary tract infection)    hx of frequent uti's    Past Surgical History:  Procedure Laterality Date   APPENDECTOMY     BREAST SURGERY Left    biopsy   CERVICAL FUSION     colon polyps     FINGER SURGERY     HEMORRHOID SURGERY     HIP SURGERY Right    INGUINAL HERNIA REPAIR Left 01/13/2017   Procedure: LEFT INGUINAL HERNIA REPAIR WITH MESH;  Surgeon: Avel Peace, MD;  Location: [facility];  Service: General;  Laterality: Left;   INSERTION OF MESH Left 01/13/2017   Procedure: INSERTION OF MESH;  Surgeon: Avel Peace, MD;  Location: [facility];  Service: General;  Laterality: Left;   LUMBAR FUSION     LUMBAR LAMINECTOMY     partial resection of colon for tumor     SHOULDER SURGERY Left    TOTAL HIP ARTHROPLASTY     right   TOTAL HIP REVISION  Right 11/17/2020   Procedure: ORIF of Right periprosthetic fracture;  Surgeon: Durene Romans, MD;  Location: [facility];  Service: Orthopedics;  Laterality: Right;    Prior to Admission medications   Medication Sig Start Date End Date Taking? Authorizing Provider  Artificial Tear Solution (GENTEAL TEARS) 0.1-0.2-0.3 % SOLN Place 1 drop into both eyes at bedtime as needed (for dryness).    [provider]  bisoprolol-hydrochlorothiazide [facility]) 5-6.25 MG tablet TAKE 1 TABLET BY MOUTH EVERY DAY 12/26/23   Burchette, Elberta Fortis, MD  Calcium-Vitamin D-Vitamin K (VIACTIV CALCIUM PLUS D) 650-12.5-40 MG-MCG-MCG CHEW As  directed Patient taking differently: Chew 1-2 tablets by mouth See admin instructions. Chew 1-2 squares by mouth once a day 11/09/23   [name], MD  feeding supplement (BOOST HIGH PROTEIN) LIQD Take 1 Container by mouth daily.    [provider]  gabapentin (NEURONTIN) 100 MG capsule Take 1 capsule (100 mg total) by mouth at bedtime. 12/27/23   Burchette, Elberta Fortis, MD  Glucosamine-Chondroitin 250-200 MG TABS Take 1 tablet by mouth daily. 11/09/23   Burchette, Elberta Fortis, MD  Multiple Vitamin (MULTIVITAMIN WITH MINERALS) TABS tablet Take 1 tablet by mouth 3 (three) times a week.    [provider]  Omega-3 Fatty Acids (FISH OIL) 1000 MG CAPS Take 2 capsules (2,000 mg total) by mouth 2 (two) times daily. Patient taking differently: Take 1,000 mg by mouth daily with breakfast. 11/09/23   Burchette, Elberta Fortis, MD  TYLENOL 325 MG tablet Take 325-650 mg by mouth every 6 (six) hours as needed for mild pain (pain score 1-3) or headache.    [provider]  TYLENOL PM EXTRA STRENGTH 500-25 MG TABS tablet Take 1 tablet by mouth at bedtime as needed (for pain or sleep).    [provider]  Vitamin D, Ergocalciferol, (DRISDOL) 1.25 MG (50000 UNIT) CAPS capsule Take 1 capsule (50,000 Units total) by mouth every 7 (seven) days. Patient taking differently: Take 50,000 Units by mouth every Friday. 11/09/23   Burchette, Elberta Fortis, MD    Current Facility-Administered Medications  Medication Dose Route Frequency Provider Last Rate Last Admin   0.9 %  sodium chloride infusion (Manually program via Guardrails IV Fluids)   Intravenous Once Arby Barrette, MD       acetaminophen (TYLENOL) tablet 650 mg  650 mg Oral Q6H PRN Gertha Calkin, MD       Or   acetaminophen (TYLENOL) suppository 650 mg  650 mg Rectal Q6H PRN Gertha Calkin, MD       hydrALAZINE (APRESOLINE) injection 5 mg  5 mg Intravenous Q4H PRN Gertha Calkin, MD       ondansetron (ZOFRAN) tablet 4 mg  4 mg Oral Q6H PRN Gertha Calkin, MD       Or   ondansetron [facility]) injection 4 mg  4 mg Intravenous Q6H PRN Gertha Calkin, MD       pantoprazole (PROTONIX) injection 40 mg  40 mg Intravenous Q12H Irena Cords V, MD       sodium chloride flush (NS) 0.9 % injection 3-10 mL  3-10 mL Intravenous Q12H Gertha Calkin, MD       sodium chloride flush (NS) 0.9 % injection 3-10 mL  3-10 mL Intravenous PRN Gertha Calkin, MD       Current Outpatient Medications  Medication Sig Dispense Refill   Artificial Tear Solution (GENTEAL TEARS) 0.1-0.2-0.3 % SOLN Place 1 drop into both eyes at bedtime  as needed (for dryness).     bisoprolol-hydrochlorothiazide (ZIAC) 5-6.25 MG tablet TAKE 1 TABLET BY MOUTH EVERY DAY 30 tablet 0   Calcium-Vitamin D-Vitamin K (VIACTIV CALCIUM PLUS D) 650-12.5-40 MG-MCG-MCG CHEW As directed (Patient taking differently: Chew 1-2 tablets by mouth See admin instructions. Chew 1-2 squares by mouth once a day) 90 tablet 3   feeding supplement (BOOST HIGH PROTEIN) LIQD Take 1 Container by mouth daily.     gabapentin (NEURONTIN) 100 MG capsule Take 1 capsule (100 mg total) by mouth at bedtime. 60 capsule 3   Glucosamine-Chondroitin 250-200 MG TABS Take 1 tablet by mouth daily. 90 tablet 3   Multiple Vitamin (MULTIVITAMIN WITH MINERALS) TABS tablet Take 1 tablet by mouth 3 (three) times a week.     Omega-3 Fatty Acids (FISH OIL) 1000 MG CAPS Take 2 capsules (2,000 mg total) by mouth 2 (two) times daily. (Patient taking differently: Take 1,000 mg by mouth daily with breakfast.) 360 capsule 3   TYLENOL 325 MG tablet Take 325-650 mg by mouth every 6 (six) hours as needed for mild pain (pain score 1-3) or headache.     TYLENOL PM EXTRA STRENGTH 500-25 MG TABS tablet Take 1 tablet by mouth at bedtime as needed (for pain or sleep).     Vitamin D, Ergocalciferol, (DRISDOL) 1.25 MG (50000 UNIT) CAPS capsule Take 1 capsule (50,000 Units total) by mouth every 7 (seven) days. (Patient taking differently: Take 50,000 Units by mouth  every Friday.) 4 capsule 0    Allergies as of 01/02/2024 - Reviewed 01/02/2024  Allergen Reaction Noted   Codeine Other (See Comments)    Flagyl [metronidazole hcl] Nausea Only 09/30/2011    Family History  Problem Relation Age of Onset   Colon cancer Mother    Cancer Father    Lung cancer Father    Ovarian cancer Sister    Breast cancer Sister    Liver cancer Brother    Heart disease Brother     Social History   Socioeconomic History   Marital status: Married    Spouse name: Not on file   Number of children: Not on file   Years of education: Not on file   Highest education level: Not on file  Occupational History   Occupation: retired    Associate Professor: RETIRED  Tobacco Use   Smoking status: Never   Smokeless tobacco: Never  Vaping Use   Vaping status: Never Used  Substance and Sexual Activity   Alcohol use: No   Drug use: No   Sexual activity: Not Currently  Other Topics Concern   Not on file  Social History Narrative   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   Married x 62 years   1 child   2 grand and 1.5 great grands    Social Drivers of Corporate investment banker Strain: Low Risk  (11/07/2023)   Overall Financial Resource Strain (CARDIA)    Difficulty of Paying Living Expenses: Not hard at all  Food Insecurity: No Food Insecurity (12/30/2023)   Hunger Vital Sign    Worried About Running Out of Food in the Last Year: Never true    Ran Out of Food in the Last Year: Never true  Transportation Needs: No Transportation Needs (12/30/2023)   PRAPARE - Administrator, Civil Service (Medical): No    Lack of Transportation (Non-Medical): No  Physical Activity: Sufficiently Active (11/07/2023)   Exercise Vital Sign    Days of Exercise per Week:  7 days    Minutes of Exercise per Session: 30 min  Stress: No Stress Concern Present (11/07/2023)   [employer] of Occupational Health - Occupational Stress Questionnaire    Feeling of Stress : Not at all  Social  Connections: Socially Integrated (12/30/2023)   Social Connection and Isolation Panel [NHANES]    Frequency of Communication with Friends and Family: More than three times a week    Frequency of Social Gatherings with Friends and Family: More than three times a week    Attends Religious Services: More than 4 times per year    Active Member of [employer] or Organizations: Yes    Attends Engineer, structural: More than 4 times per year    Marital Status: Married  Catering manager Violence: Not At Risk (12/30/2023)   Humiliation, Afraid, Rape, and Kick questionnaire    Fear of Current or Ex-Partner: No    Emotionally Abused: No    Physically Abused: No    Sexually Abused: No    Review of Systems: Pertinent positive and negative review of systems were noted in the above HPI section.  All other review of systems was otherwise negative.   Physical Exam: Vital signs in last 24 hours: Temp:  [98.1 F (36.7 C)] 98.1 F (36.7 C) (03/10 1044) Pulse Rate:  [85] 85 (03/10 1044) Resp:  [17] 17 (03/10 1044) BP: (107)/(90) 107/90 (03/10 1044) SpO2:  [96 %-97 %] 97 % (03/10 1044)   General:   Alert,  Well-developed, well-nourished, thin, frail, pale appearing elderly white female pleasant and cooperative in NAD Head:  Normocephalic and atraumatic. Eyes:  Sclera clear, no icterus.   Conjunctiva pink. Ears:  Normal auditory acuity. Nose:  No deformity, discharge,  or lesions. Mouth:  No deformity or lesions.   Neck:  Supple; no masses or thyromegaly. Lungs:  Clear throughout to auscultation.   No wheezes, crackles, or rhonchi.  Heart:  Regular rate and rhythm; no murmurs, clicks, rubs,  or gallops. Abdomen:  Soft,no tenderness, BS active,nonpalp mass or hsm.  Midline incisional scar and inguinal hernia scar on the left Rectal: Not done, patient sitting in a line in the emergency room in a wheelchair -no room to examine available Msk:  Symmetrical without gross deformities. . Pulses:  Normal  pulses noted. Extremities:  Without clubbing or edema. Neurologic:  Alert and  oriented x4;  grossly normal neurologically. Skin:  Intact without significant lesions or rashes.. Psych:  Alert and cooperative. Normal mood and affect.  Intake/Output from previous day: No intake/output data recorded. Intake/Output this shift: No intake/output data recorded.  Lab Results: Recent Labs    12/31/23 0737 12/31/23 1219 01/01/24 0610 01/02/24 1047  WBC 10.4 10.5  --  15.3*  HGB 10.0* 10.6* 9.5* 6.8*  HCT 30.0* 33.5* 30.1* 20.9*  PLT 167 171  --  152   BMET Recent Labs    12/30/23 1604 12/31/23 0737  NA 134* 134*  K 3.9 3.6  CL 103 105  CO2 20* 21*  GLUCOSE 147* 102*  BUN 65* 52*  CREATININE 0.79 0.68  CALCIUM 8.4* 7.8*   LFT Recent Labs    12/31/23 0737  PROT 5.4*  ALBUMIN 2.9*  AST 21  ALT 11  ALKPHOS 30*  BILITOT 1.3*   PT/INR Recent Labs    01/02/24 1047  LABPROT 15.5*  INR 1.2   Hepatitis Panel No results for input(s): "HEPBSAG", "HCVAB", "HEPAIGM", "HEPBIGM" in the last 72 hours.  IMPRESSION:  #74 89 year old white  female brought to the emergency room with weakness, lightheadedness and presyncope/syncope at home this a.m. Patient reporting black stools over the past 2 days Hemoglobin 6.8 today, normocytic  She just had admission at [facility] 3/7- 01/01/24 again with weakness lightheadedness and dizziness, and also noted to be anemic at that time with hemoglobin 7.9 down from 10.7 about 4 days previous. Hemoccult was documented as negative so she was transfused 2 units of packed RBCs, and allowed discharge to home yesterday with plans to follow-up with hematology.  Appears that the patient has been having a subacute GI bleed, suspect upper but will need to consider small bowel or lower GI sources as well.  CTA was ordered today, not read as yet  #2 known diverticulosis and internal/external hemorrhoids. #3.  Leukocytosis etiology not clear #4.  History  of hypertension #5.  Hyperlipidemia #6.  Status post right hip arthroplasty #7.  Status post remote inguinal hernia repair and very remote colon Surgery about 50 years ago perhaps for polyp removal   PLAN: Clear liquid diet this evening N.p.o. after midnight Agree with twice daily IV PPI Transfuse 2 units of packed RBCs then continue to monitor serial hemoglobins and transfuse as indicated We will plan for EGD in a.m. with Dr. Tomasa Rand.  Procedure was discussed in detail with the patient including indications risks and benefits and she is agreeable to proceed. Further recommendations pending findings at EGD  Will follow-up on CTA later this afternoon   Korby Ratay EsterwoodPA-C  01/02/2024, 2:06 PM

## 2024-01-02 NOTE — ED Provider Triage Note (Signed)
 Emergency Medicine Provider Triage Evaluation Note  [name] , a 89 y.o. female  was evaluated in triage.  Pt complains of lightheadedness and passing out twice.  Patient has history of a GI bleed.  She was recently hospitalized and transfused.  Patient denies striking her head.  She was assisted to the floor by her husband.  Patient reports however she has had a severe headache for couple of days.  Patient denies that she has taken any of her blood pressure medications for 3 days.  Review of Systems  Positive: Syncope Negative: Chest pain or abdominal pain  Physical Exam  BP (!) 107/90   Pulse 85   Temp 98.1 F (36.7 C)   Resp 17   SpO2 97%  Gen:   Awake, no distress pale in appearance Resp:  Normal effort  MSK:   Moves extremities without difficulty peripheral pulses 2+ Other:  Patient is pale in appearance.  Mental status is clear.  Speech is clear.  No focal neurologic deficits.  Medical Decision Making  Medically screening exam initiated at 11:11 AM.  Appropriate orders placed.  [name] was informed that the remainder of the evaluation will be completed by another provider, this initial triage assessment does not replace that evaluation, and the importance of remaining in the ED until their evaluation is complete.     Arby Barrette, MD 01/02/24 1115

## 2024-01-02 NOTE — ED Provider Notes (Addendum)
 [name] EMERGENCY DEPARTMENT AT [facility] Provider Note   CSN: [id number] Arrival date & time: 01/02/24  1035     History  Chief Complaint  Patient presents with   Loss of Consciousness   Weakness   GI Bleeding    [name] is a 89 y.o. female.  HPI [name] , a 89 y.o. female  was evaluated in triage.  Pt complains of lightheadedness and passing out twice.  Patient has history of a GI bleed.  She was recently hospitalized and transfused.  Patient denies striking her head.  She was assisted to the floor by her husband.  Patient reports however she has had a severe headache for couple of days.  Patient denies that she has taken any of her blood pressure medications for 3 days.     Home Medications Prior to Admission medications   Medication Sig Start Date End Date Taking? Authorizing Provider  Artificial Tear Solution (GENTEAL TEARS) 0.1-0.2-0.3 % SOLN Place 1 drop into both eyes at bedtime as needed (for dryness).    [provider]  bisoprolol-hydrochlorothiazide [facility]) 5-6.25 MG tablet TAKE 1 TABLET BY MOUTH EVERY DAY 12/26/23   Burchette, Elberta Fortis, MD  Calcium-Vitamin D-Vitamin K (VIACTIV CALCIUM PLUS D) 650-12.5-40 MG-MCG-MCG CHEW As directed Patient taking differently: Chew 1-2 tablets by mouth See admin instructions. Chew 1-2 squares by mouth once a day 11/09/23   [name], MD  feeding supplement (BOOST HIGH PROTEIN) LIQD Take 1 Container by mouth daily.    [provider]  gabapentin (NEURONTIN) 100 MG capsule Take 1 capsule (100 mg total) by mouth at bedtime. 12/27/23   Burchette, Elberta Fortis, MD  Glucosamine-Chondroitin 250-200 MG TABS Take 1 tablet by mouth daily. 11/09/23   Burchette, Elberta Fortis, MD  Multiple Vitamin (MULTIVITAMIN WITH MINERALS) TABS tablet Take 1 tablet by mouth 3 (three) times a week.    [provider]  Omega-3 Fatty Acids (FISH OIL) 1000 MG CAPS Take 2 capsules (2,000 mg total) by mouth 2  (two) times daily. Patient taking differently: Take 1,000 mg by mouth daily with breakfast. 11/09/23   Burchette, Elberta Fortis, MD  TYLENOL 325 MG tablet Take 325-650 mg by mouth every 6 (six) hours as needed for mild pain (pain score 1-3) or headache.    [provider]  TYLENOL PM EXTRA STRENGTH 500-25 MG TABS tablet Take 1 tablet by mouth at bedtime as needed (for pain or sleep).    [provider]  Vitamin D, Ergocalciferol, (DRISDOL) 1.25 MG (50000 UNIT) CAPS capsule Take 1 capsule (50,000 Units total) by mouth every 7 (seven) days. Patient taking differently: Take 50,000 Units by mouth every Friday. 11/09/23   Burchette, Elberta Fortis, MD      Allergies    Codeine and Flagyl [metronidazole hcl]    Review of Systems   Review of Systems  Physical Exam Updated Vital Signs BP (!) 107/90   Pulse 85   Temp 98.1 F (36.7 C)   Resp 17   SpO2 97%  Physical Exam Constitutional:      Comments: Alert nontoxic clear mental status.  Speech is clear.  No respiratory distress at rest.  Pale in appearance.  HENT:     Head: Normocephalic and atraumatic.     Mouth/Throat:     Pharynx: Oropharynx is clear.  Eyes:     Extraocular Movements: Extraocular movements intact.  Cardiovascular:     Rate and Rhythm: Normal rate and regular rhythm.  Pulses: Normal pulses.  Pulmonary:     Effort: Pulmonary effort is normal.     Breath sounds: Normal breath sounds.  Abdominal:     General: There is no distension.     Palpations: Abdomen is soft.     Tenderness: There is no abdominal tenderness. There is no guarding.  Musculoskeletal:        General: No deformity or signs of injury. Normal range of motion.  Skin:    General: Skin is warm and dry.     Coloration: Skin is pale.  Neurological:     General: No focal deficit present.     Mental Status: She is oriented to person, place, and time.     Coordination: Coordination normal.  Psychiatric:        Mood and Affect: Mood normal.      ED Results / Procedures / Treatments   Labs (all labs ordered are listed, but only abnormal results are displayed) Labs Reviewed  CBC WITH DIFFERENTIAL/PLATELET - Abnormal; Notable for the following components:      Result Value   WBC 15.3 (*)    RBC 2.20 (*)    Hemoglobin 6.8 (*)    HCT 20.9 (*)    Neutro Abs 12.9 (*)    Abs Immature Granulocytes 0.14 (*)    All other components within normal limits  PROTIME-INR - Abnormal; Notable for the following components:   Prothrombin Time 15.5 (*)    All other components within normal limits  URINALYSIS, ROUTINE W REFLEX MICROSCOPIC  COMPREHENSIVE METABOLIC PANEL  CBG MONITORING, ED  POC OCCULT BLOOD, ED  TYPE AND SCREEN  PREPARE RBC (CROSSMATCH)    EKG EKG Interpretation Date/Time:  Monday January 02 2024 12:01:29 EDT Ventricular Rate:  92 PR Interval:  190 QRS Duration:  76 QT Interval:  390 QTC Calculation: 482 R Axis:   73  Text Interpretation: Normal sinus rhythm ST & T wave abnormality, consider inferior ischemia ST & T wave abnormality, consider anterolateral ischemia Prolonged QT Abnormal ECG When compared with ECG of 30-Dec-2023 15:56, PREVIOUS ECG IS PRESENT no sig change from previous Confirmed by Arby Barrette [phone]) on 01/02/2024 12:13:47 PM  Radiology No results found.  Procedures Procedures   CRITICAL CARE Performed by: Arby Barrette   Total critical care time: 30 minutes  Critical care time was exclusive of separately billable procedures and treating other patients.  Critical care was necessary to treat or prevent imminent or life-threatening deterioration.  Critical care was time spent personally by me on the following activities: development of treatment plan with patient and/or surrogate as well as nursing, discussions with consultants, evaluation of patient's response to treatment, examination of patient, obtaining history from patient or surrogate, ordering and performing treatments and  interventions, ordering and review of laboratory studies, ordering and review of radiographic studies, pulse oximetry and re-evaluation of patient's condition.  Medications Ordered in ED Medications  0.9 %  sodium chloride infusion (Manually program via Guardrails IV Fluids) (has no administration in time range)    ED Course/ Medical Decision Making/ A&P                                 Medical Decision Making Amount and/or Complexity of Data Reviewed Labs: ordered. Radiology: ordered.  Risk Prescription drug management. Decision regarding hospitalization.   Patient presents with 2 syncopal episodes this morning.  Patient was just discharged yesterday with critical anemia requiring  blood transfusion.  Patient had unidentified source of blood loss from admission and tested heme-negative on guaiac specimen.  However her BUN has been elevating and this still appears most likely to be GI bleed.  Patient has history of GI bleed but has not noted any significant blood or tarry stool.  At this time GI bleed seems most likely etiology for abrupt blood loss without other identified source.  White count 15.3 hemoglobin 6.8 platelets 152.  EKG shows nonspecific T wave inversions diffusely but is consistent with prior tracings.  No STEMI pattern present.  Patient is not having any chest pain or abdominal pain.  At this time with 2 syncopal episodes and hemoglobin identified at 6.8, patient will require transfusion and readmission.  Patient was discharged yesterday from hospitalist service.  Will consult for readmission.  Consult: [facility] hospitalist for admission      Final Clinical Impression(s) / ED Diagnoses Final diagnoses:  Symptomatic anemia  Syncope and collapse    Rx / DC Orders ED Discharge Orders     None         Arby Barrette, MD 01/02/24 1225    Arby Barrette, MD 01/02/24 1238

## 2024-01-02 NOTE — ED Triage Notes (Addendum)
 Pt arrives via EMS from home. Pt reports increased weakness and several syncopal episodes today (pt was assisted to the ground by family at home, denies injury). Pt was recently seen at [facility] and admitted for anemia. Pt reports dark tarry stool. She is pale during triage. AxOx4.

## 2024-01-02 NOTE — Telephone Encounter (Signed)
 Received call from daughter. She stated mother spent weekend in hospital and was d/c'd yesterday. Per daughter, mother had "panic blood" during hospitalization and states the hospital d/c instructions state her mother was to be referred to a hematologist.   Daughter reports mother was to be scheduled for an appointment this week. Daughter states she does not know how to schedule this appointment. Informed daughter that [facility]errals for [facility] patient appointment are scheduled by new patient scheduler, by priority request, and that they will contact her mother.  Daughter then asked if mother can be scheduled for an appointment today.  Daughter reports that this morning, her mother is weak, cannot feel feet and passing black stools. "She is sitting in the floor right now".  Recommended patient go to ED with these reported symptoms. Daughter verbalized understanding

## 2024-01-03 ENCOUNTER — Encounter (HOSPITAL_COMMUNITY)
Admission: EM | Disposition: A | Discharge status: Home / Self Care | Payer: Self-pay | Source: Home / Self Care | Attending: Internal Medicine

## 2024-01-03 ENCOUNTER — Inpatient Hospital Stay (HOSPITAL_COMMUNITY): Admitting: Anesthesiology

## 2024-01-03 DIAGNOSIS — D62 Acute posthemorrhagic anemia: Secondary | ICD-10-CM

## 2024-01-03 DIAGNOSIS — Z801 Family history of malignant neoplasm of trachea, bronchus and lung: Secondary | ICD-10-CM | POA: Diagnosis not present

## 2024-01-03 DIAGNOSIS — K2971 Gastritis, unspecified, with bleeding: Secondary | ICD-10-CM | POA: Diagnosis present

## 2024-01-03 DIAGNOSIS — Z885 Allergy status to narcotic agent status: Secondary | ICD-10-CM | POA: Diagnosis not present

## 2024-01-03 DIAGNOSIS — Z9049 Acquired absence of other specified parts of digestive tract: Secondary | ICD-10-CM | POA: Diagnosis not present

## 2024-01-03 DIAGNOSIS — B9681 Helicobacter pylori [H. pylori] as the cause of diseases classified elsewhere: Secondary | ICD-10-CM | POA: Diagnosis present

## 2024-01-03 DIAGNOSIS — D649 Anemia, unspecified: Secondary | ICD-10-CM | POA: Diagnosis not present

## 2024-01-03 DIAGNOSIS — C8513 Unspecified B-cell lymphoma, intra-abdominal lymph nodes: Secondary | ICD-10-CM

## 2024-01-03 DIAGNOSIS — Z888 Allergy status to other drugs, medicaments and biological substances status: Secondary | ICD-10-CM | POA: Diagnosis not present

## 2024-01-03 DIAGNOSIS — K3189 Other diseases of stomach and duodenum: Secondary | ICD-10-CM | POA: Diagnosis present

## 2024-01-03 DIAGNOSIS — E785 Hyperlipidemia, unspecified: Secondary | ICD-10-CM | POA: Diagnosis present

## 2024-01-03 DIAGNOSIS — K254 Chronic or unspecified gastric ulcer with hemorrhage: Secondary | ICD-10-CM

## 2024-01-03 DIAGNOSIS — K259 Gastric ulcer, unspecified as acute or chronic, without hemorrhage or perforation: Secondary | ICD-10-CM

## 2024-01-03 DIAGNOSIS — K2970 Gastritis, unspecified, without bleeding: Secondary | ICD-10-CM | POA: Diagnosis not present

## 2024-01-03 DIAGNOSIS — Z79899 Other long term (current) drug therapy: Secondary | ICD-10-CM | POA: Diagnosis not present

## 2024-01-03 DIAGNOSIS — G629 Polyneuropathy, unspecified: Secondary | ICD-10-CM | POA: Diagnosis present

## 2024-01-03 DIAGNOSIS — K921 Melena: Secondary | ICD-10-CM | POA: Diagnosis not present

## 2024-01-03 DIAGNOSIS — Z860100 Personal history of colon polyps, unspecified: Secondary | ICD-10-CM | POA: Diagnosis not present

## 2024-01-03 DIAGNOSIS — M810 Age-related osteoporosis without current pathological fracture: Secondary | ICD-10-CM | POA: Diagnosis present

## 2024-01-03 DIAGNOSIS — I10 Essential (primary) hypertension: Secondary | ICD-10-CM | POA: Diagnosis present

## 2024-01-03 DIAGNOSIS — Z800 Family history of malignant neoplasm of digestive organs: Secondary | ICD-10-CM | POA: Diagnosis not present

## 2024-01-03 DIAGNOSIS — K2951 Unspecified chronic gastritis with bleeding: Secondary | ICD-10-CM | POA: Diagnosis not present

## 2024-01-03 DIAGNOSIS — K922 Gastrointestinal hemorrhage, unspecified: Secondary | ICD-10-CM | POA: Diagnosis present

## 2024-01-03 DIAGNOSIS — K5730 Diverticulosis of large intestine without perforation or abscess without bleeding: Secondary | ICD-10-CM | POA: Diagnosis present

## 2024-01-03 DIAGNOSIS — R55 Syncope and collapse: Secondary | ICD-10-CM | POA: Diagnosis present

## 2024-01-03 DIAGNOSIS — Z8249 Family history of ischemic heart disease and other diseases of the circulatory system: Secondary | ICD-10-CM | POA: Diagnosis not present

## 2024-01-03 DIAGNOSIS — Z803 Family history of malignant neoplasm of breast: Secondary | ICD-10-CM | POA: Diagnosis not present

## 2024-01-03 DIAGNOSIS — Z8041 Family history of malignant neoplasm of ovary: Secondary | ICD-10-CM | POA: Diagnosis not present

## 2024-01-03 DIAGNOSIS — Z96641 Presence of right artificial hip joint: Secondary | ICD-10-CM | POA: Diagnosis present

## 2024-01-03 DIAGNOSIS — C8333 Diffuse large B-cell lymphoma, intra-abdominal lymph nodes: Secondary | ICD-10-CM | POA: Diagnosis not present

## 2024-01-03 DIAGNOSIS — Z981 Arthrodesis status: Secondary | ICD-10-CM | POA: Diagnosis not present

## 2024-01-03 HISTORY — PX: BIOPSY OF SKIN SUBCUTANEOUS TISSUE AND/OR MUCOUS MEMBRANE: SHX6741

## 2024-01-03 HISTORY — PX: ESOPHAGOGASTRODUODENOSCOPY: SHX5428

## 2024-01-03 LAB — COMPREHENSIVE METABOLIC PANEL
ALT: 12 U/L (ref 0–44)
AST: 19 U/L (ref 15–41)
Albumin: 2.4 g/dL — ABNORMAL LOW (ref 3.5–5.0)
Alkaline Phosphatase: 25 U/L — ABNORMAL LOW (ref 38–126)
Anion gap: 4 — ABNORMAL LOW (ref 5–15)
BUN: 61 mg/dL — ABNORMAL HIGH (ref 8–23)
CO2: 24 mmol/L (ref 22–32)
Calcium: 7.8 mg/dL — ABNORMAL LOW (ref 8.9–10.3)
Chloride: 108 mmol/L (ref 98–111)
Creatinine, Ser: 0.94 mg/dL (ref 0.44–1.00)
GFR, Estimated: 58 mL/min — ABNORMAL LOW (ref >60)
Glucose, Bld: 110 mg/dL — ABNORMAL HIGH (ref 70–99)
Potassium: 3.8 mmol/L (ref 3.5–5.1)
Sodium: 136 mmol/L (ref 135–145)
Total Bilirubin: 0.5 mg/dL (ref 0.0–1.2)
Total Protein: 4.4 g/dL — ABNORMAL LOW (ref 6.5–8.1)

## 2024-01-03 LAB — CBC
HCT: 18.6 % — ABNORMAL LOW (ref 36.0–46.0)
HCT: 25.6 % — ABNORMAL LOW (ref 36.0–46.0)
Hemoglobin: 6.1 g/dL — CL (ref 12.0–15.0)
Hemoglobin: 8.7 g/dL — ABNORMAL LOW (ref 12.0–15.0)
MCH: 30.2 pg (ref 26.0–34.0)
MCH: 30.4 pg (ref 26.0–34.0)
MCHC: 32.8 g/dL (ref 30.0–36.0)
MCHC: 34 g/dL (ref 30.0–36.0)
MCV: 92.1 fL (ref 80.0–100.0)
MCV: 89.5 fL (ref 80.0–100.0)
Platelets: 156 10*3/uL (ref 150–400)
Platelets: 138 10*3/uL — ABNORMAL LOW (ref 150–400)
RBC: 2.02 MIL/uL — ABNORMAL LOW (ref 3.87–5.11)
RBC: 2.86 MIL/uL — ABNORMAL LOW (ref 3.87–5.11)
RDW: 14.6 % (ref 11.5–15.5)
RDW: 15.2 % (ref 11.5–15.5)
WBC: 13.1 10*3/uL — ABNORMAL HIGH (ref 4.0–10.5)
WBC: 9.5 10*3/uL (ref 4.0–10.5)
nRBC: 0 % (ref 0.0–0.2)
nRBC: 0 % (ref 0.0–0.2)

## 2024-01-03 LAB — IRON AND TIBC
Iron: 63 ug/dL (ref 28–170)
Saturation Ratios: 26 % (ref 10.4–31.8)
TIBC: 239 ug/dL — ABNORMAL LOW (ref 250–450)
UIBC: 176 ug/dL

## 2024-01-03 LAB — RETICULOCYTES
Immature Retic Fract: 34.9 % — ABNORMAL HIGH (ref 2.3–15.9)
RBC.: 2.01 MIL/uL — ABNORMAL LOW (ref 3.87–5.11)
Retic Count, Absolute: 91.7 10*3/uL (ref 19.0–186.0)
Retic Ct Pct: 4.6 % — ABNORMAL HIGH (ref 0.4–3.1)

## 2024-01-03 LAB — VITAMIN B12: Vitamin B-12: 563 pg/mL (ref 180–914)

## 2024-01-03 LAB — FOLATE: Folate: 24.8 ng/mL (ref >5.9)

## 2024-01-03 LAB — FERRITIN: Ferritin: 112 ng/mL (ref 11–307)

## 2024-01-03 SURGERY — EGD (ESOPHAGOGASTRODUODENOSCOPY)
Anesthesia: Monitor Anesthesia Care

## 2024-01-03 MED ORDER — SUCRALFATE 1 GM/10ML PO SUSP
1.0000 g | Freq: Three times a day (TID) | ORAL | Status: DC
  Administered 2024-01-03 – 2024-01-05 (×8): 1 g via ORAL
  Filled 2024-01-03 (×8): qty 10

## 2024-01-03 MED ORDER — LIDOCAINE 2% (20 MG/ML) 5 ML SYRINGE
INTRAMUSCULAR | Status: DC | PRN
  Administered 2024-01-03: 60 mg via INTRAVENOUS

## 2024-01-03 MED ORDER — GABAPENTIN 100 MG PO CAPS
100.0000 mg | ORAL_CAPSULE | Freq: Every day | ORAL | Status: DC
  Administered 2024-01-03 – 2024-01-04 (×2): 100 mg via ORAL
  Filled 2024-01-03 (×2): qty 1

## 2024-01-03 MED ORDER — PHENYLEPHRINE 80 MCG/ML (10ML) SYRINGE FOR IV PUSH (FOR BLOOD PRESSURE SUPPORT)
PREFILLED_SYRINGE | INTRAVENOUS | Status: DC | PRN
  Administered 2024-01-03 (×3): 80 ug via INTRAVENOUS

## 2024-01-03 MED ORDER — SODIUM CHLORIDE 0.9 % IV SOLN: INTRAVENOUS | Status: DC | PRN

## 2024-01-03 MED ORDER — PROPOFOL 500 MG/50ML IV EMUL
INTRAVENOUS | Status: DC | PRN
  Administered 2024-01-03: 75 ug/kg/min via INTRAVENOUS

## 2024-01-03 MED ORDER — PROPOFOL 10 MG/ML IV BOLUS
INTRAVENOUS | Status: DC | PRN
  Administered 2024-01-03 (×3): 20 mg via INTRAVENOUS
  Administered 2024-01-03 (×2): 10 mg via INTRAVENOUS
  Administered 2024-01-03: 20 mg via INTRAVENOUS

## 2024-01-03 NOTE — Plan of Care (Signed)

## 2024-01-03 NOTE — Progress Notes (Signed)
 PT Cancellation Note  Patient Details Name: [name] MRN: [id number] DOB: Jul 16, 1932   Cancelled Treatment:    Reason Eval/Treat Not Completed: Patient at procedure or test/unavailable. PT order for tomorrow and pt currently at procedure. Will plan to eval tomorrow.   Lyanne Co, PT  Acute Rehab Services Secure chat preferred Office [phone]    Lawana Chambers Curren Mohrmann 01/03/2024, 2:02 PM

## 2024-01-03 NOTE — Progress Notes (Signed)
 Patient ID: [name], female   DOB: 12/16/1931, 89 y.o.   MRN: [id number]    Progress Note   Subjective   Day # 2 CC; weakness, syncope, black stool  Patient has not had any further stools overnight or this morning, no nausea or vomiting, says she has been having some cramping in her upper abdomen recently.  Labs-WBC 13.1/hemoglobin 6.1/hematocrit 18.6 down further since yesterday-hemoglobin 6.8 on arrival  Troponin yesterday 8  Potassium 3.8/BUN 61/creatinine 0.94  Patient had orders for transfusion of 2 units of packed RBCs stat through the emergency room yesterday, this did not occur nursing on the floor reports that they were told that she had 2 units of packed cells in the emergency room.  CT angio abdomen yesterday-no active bleeding, diverticular disease of the colon with possible minimal wall thickening splenic flexure/proximal descending Irregular masslike thickening of the gastric cardia and lesser curvature of the stomach concern raised for gastric malignancy Moderate atherosclerosis of the aorta and at least moderate stenosis of the origin of the celiac trunk    Objective   Vital signs in last 24 hours: Temp:  [97.3 F (36.3 C)-98.4 F (36.9 C)] 97.3 F (36.3 C) (03/11 0855) Pulse Rate:  [85-115] 115 (03/11 0855) Resp:  [17-18] 17 (03/11 0855) BP: (82-125)/(55-90) 118/64 (03/11 0855) SpO2:  [96 %-100 %] 100 % (03/11 0855) Weight:  [57.8 kg] 57.8 kg (03/10 1734) Last BM Date : 12/31/23 General:   Very elderly white female in NAD, pale Heart:  Regular rate and rhythm; no murmurs Lungs: Respirations even and unlabored, lungs CTA bilaterally Abdomen:  Soft, minimally tender in the epigastrium and nondistended. Normal bowel sounds. Extremities:  Without edema. Neurologic:  Alert and oriented,  grossly normal neurologically. Psych:  Cooperative. Normal mood and affect.  Intake/Output from previous day: 03/10 0701 - 03/11 0700 In: 13 [P.O.:10;  I.V.:3] Out: 0  Intake/Output this shift: No intake/output data recorded.  Lab Results: Recent Labs    12/31/23 1219 01/01/24 0610 01/02/24 1047 01/03/24 0333  WBC 10.5  --  15.3* 13.1*  HGB 10.6* 9.5* 6.8* 6.1*  HCT 33.5* 30.1* 20.9* 18.6*  PLT 171  --  152 156   BMET Recent Labs    01/02/24 1601 01/03/24 0333  NA 134* 136  K 4.2 3.8  CL 104 108  CO2 19* 24  GLUCOSE 139* 110*  BUN 66* 61*  CREATININE 0.80 0.94  CALCIUM 8.1* 7.8*   LFT Recent Labs    01/03/24 0333  PROT 4.4*  ALBUMIN 2.4*  AST 19  ALT 12  ALKPHOS 25*  BILITOT 0.5   PT/INR Recent Labs    01/02/24 1047  LABPROT 15.5*  INR 1.2    Studies/Results: CT ANGIO GI BLEED Result Date: 01/02/2024 CLINICAL DATA:  Lower GI bleeding EXAM: CTA ABDOMEN AND PELVIS WITHOUT AND WITH CONTRAST TECHNIQUE: Multidetector CT imaging of the abdomen and pelvis was performed using the standard protocol during bolus administration of intravenous contrast. Multiplanar reconstructed images and MIPs were obtained and reviewed to evaluate the vascular anatomy. RADIATION DOSE REDUCTION: This exam was performed according to the departmental dose-optimization program which includes automated exposure control, adjustment of the mA and/or kV according to patient size and/or use of iterative reconstruction technique. CONTRAST:  75mL OMNIPAQUE IOHEXOL 350 MG/ML SOLN COMPARISON:  CT 11/16/2020 FINDINGS: VASCULAR Aorta: Normal caliber aorta without aneurysm, dissection, vasculitis or significant stenosis. Moderate atherosclerosis Celiac: At least moderate focal stenosis at the origin of the celiac trunk. Distal  patency. No aneurysm, dissection or occlusion SMA: Patent without evidence of aneurysm, dissection, vasculitis or significant stenosis. Renals: Calcification at the origin of right renal artery without significant stenosis. Both dominant renal arteries are patent and without significant aneurysm dissection or stenosis. There are tiny  lower pole accessory arteries bilaterally IMA: Diminutive but patent Inflow: Patent without evidence of aneurysm, dissection, vasculitis or significant stenosis. Proximal Outflow: Bilateral common femoral and visualized portions of the superficial and profunda femoral arteries are patent without evidence of aneurysm, dissection, vasculitis or significant stenosis. Veins: Portal and splenic veins are patent. Review of the MIP images confirms the above findings. NON-VASCULAR Lower chest: Lung bases demonstrate mild scarring at the bases. No acute airspace disease Hepatobiliary: Subcentimeter hypodensity in the right hepatic lobe too small to further characterize but unchanged and therefore likely benign. No gallstones, gallbladder wall thickening, or biliary dilatation. Pancreas: Unremarkable. No pancreatic ductal dilatation or surrounding inflammatory changes. Spleen: Normal in size without focal abnormality. Adrenals/Urinary Tract: Adrenal glands are normal. Kidneys show no hydronephrosis. The bladder is unremarkable Stomach/Bowel: Irregular masslike thickening at the gastric cardia and lesser curvature of stomach, series 14, image 17 through 34 and coronal series 17 image 90. No dilated small bowel. Diverticular disease of the colon. Possible minimal fat stranding and wall thickening at the splenic flexure/proximal descending colon as may be seen with mild diverticulitis. No intraluminal extravasation to suggest active GI bleeding on this exam. Lymphatic: No suspicious lymph nodes Reproductive: Uterus is atrophic.  2.2 cm left adnexal cyst. Other: Negative for pelvic effusion or free air Musculoskeletal: Right hip replacement. Multilevel degenerative changes. Mild chronic superior endplate deformity at T12. question mild posterior cortical erosive change at the L2 vertebral body, sagittal series 18, image 108. IMPRESSION: 1. Negative for active GI bleeding. Diverticular disease of the colon with possible minimal  wall thickening and fat stranding at the splenic flexure/proximal descending colon as may be seen with an acute diverticulitis. 2. Irregular masslike thickening at the gastric cardia and lesser curvature of stomach, concern raised for gastric malignancy. Correlation with endoscopy is recommended 3. Moderate atherosclerosis of the aorta with at least moderate stenosis at the origin of the celiac trunk. Negative for dissection or occlusive disease 4. Question mild posterior cortical erosive change at the L2 vertebral body. This could be further assessed with MRI 5. Aortic atherosclerosis. Electronically Signed   By: Jasmine Pang M.D.   On: 01/02/2024 16:45   CT Head Wo Contrast Result Date: 01/02/2024 CLINICAL DATA:  Head trauma, minor (Age >= 65y). EXAM: CT HEAD WITHOUT CONTRAST TECHNIQUE: Contiguous axial images were obtained from the base of the skull through the vertex without intravenous contrast. RADIATION DOSE REDUCTION: This exam was performed according to the departmental dose-optimization program which includes automated exposure control, adjustment of the mA and/or kV according to patient size and/or use of iterative reconstruction technique. COMPARISON:  None Available. FINDINGS: Brain: There is no evidence of an acute infarct, intracranial hemorrhage, mass, midline shift, or extra-axial fluid collection. Mild cerebral atrophy is within normal limits for age. Cerebral white matter hypodensities are nonspecific but compatible with mild chronic small vessel ischemic disease. Vascular: No hyperdense vessel or unexpected calcification. Skull: No acute fracture or suspicious lesion. Sinuses/Orbits: Visualized paranasal sinuses and mastoid air cells are clear. Unremarkable included orbits. Other: None. IMPRESSION: 1. No evidence of acute intracranial abnormality. 2. Mild chronic small vessel ischemic disease. Electronically Signed   By: Sebastian Ache M.D.   On: 01/02/2024 15:56  Assessment / Plan:     #20 89 year old white female  with profound weakness, syncope and new onset black stool over the past 2 days. Patient had been hospitalized at [facility] 2 days prior to that with severe anemia and hemoglobin of 7.9, received blood transfusions but stool was heme-negative so no GI consultation and was to be seen by hematology outpatient.  She has now demonstrated evidence of ongoing GI blood loss.  CT angio yesterday no active bleeding but did show an irregular masslike area in the gastric cardia concerning for malignancy and mild changes in the colon concerning for possible mild diverticulitis  Hemoglobin back down to 6.8 yesterday she was supposed to have 2 units of packed RBCs in the emergency room, unfortunately this did not occur and she has yet to have any blood transfusions. Hemoglobin this morning down to 6.1  Plan; keep n.p.o. this a.m., possible EGD late this afternoon with Dr. Tomasa Rand Needs to get 2 units of packed RBCs this morning started ASAP Continue IV PPI twice daily Continue  to trend hemoglobin every 8 hours and transfuse as indicated Further plans pending results at EGD.      Principal Problem:   Syncope and collapse Active Problems:   Essential hypertension   Acute blood loss anemia (ABLA)   GIB (gastrointestinal bleeding)     LOS: 0 days   Miyana Mordecai PA-C 01/03/2024, 9:24 AM

## 2024-01-03 NOTE — TOC CM/SW Note (Signed)
 Transition of Care [facility]) - Inpatient Brief Assessment   Patient Details  Name: [name] MRN: [id number] Date of Birth: 06/26/1932  Transition of Care [facility]) CM/SW Contact:    Tom-Johnson, Hershal Coria, RN Phone Number: 01/03/2024, 4:23 PM   Clinical Narrative:  Patient presented to the ED with increased Weakness, dark Tarry Stool and Syncopal episodes at home. Patient was recently discharged from [facility] on 01/01/24 with Anemia.  Hgb on admit was 6.8, dropped down to 6.1. 2U PRBC given. Scheduled for Upper GI Endoscopy today. GI following.   From home with husband, has a supportive daughter who lives closeby. Modified independent , has a cane, walker and shower seat at home.  PCP is Burchette, Elberta Fortis, MD and uses [facility] on [address] in [place].  Awaiting PT eval for disposition.  Patient not Medically ready for discharge.  CM will continue to follow as patient progresses with care towards discharge.            Transition of Care Asessment: Insurance and Status: Insurance coverage has been reviewed Patient has primary care physician: Yes Home environment has been reviewed: Yes Prior level of function:: Modified independent Prior/Current Home Services: No current home services Social Drivers of Health Review: SDOH reviewed no interventions necessary Readmission risk has been reviewed: Yes Transition of care needs: transition of care needs identified, TOC will continue to follow

## 2024-01-03 NOTE — Transfer of Care (Signed)
 Immediate Anesthesia Transfer of Care Note  Patient: [name]  Procedure(s) Performed: EGD (ESOPHAGOGASTRODUODENOSCOPY) BIOPSY, gastric  Patient Location: PACU and Endoscopy Unit  Anesthesia Type:MAC  Level of Consciousness: awake and alert   Airway & Oxygen Therapy: Patient Spontanous Breathing  Post-op Assessment: Report given to RN and Post -op Vital signs reviewed and stable  Post vital signs: Reviewed and stable  Last Vitals:  Vitals Value Taken Time  BP 117/75 01/03/24 1513  Temp 36.3 C 01/03/24 1513  Pulse 90 01/03/24 1515  Resp 20 01/03/24 1515  SpO2 98 % 01/03/24 1515  Vitals shown include unfiled device data.  Last Pain:  Vitals:   01/03/24 1513  TempSrc: Temporal  PainSc:          Complications: No notable events documented.

## 2024-01-03 NOTE — Anesthesia Preprocedure Evaluation (Addendum)
 Anesthesia Evaluation  Patient identified by MRN, date of birth, ID band Patient awake    Reviewed: Allergy & Precautions, NPO status , Patient's Chart, lab work & pertinent test results, reviewed documented beta blocker date and time   History of Anesthesia Complications Negative for: history of anesthetic complications  Airway Mallampati: II  TM Distance: >3 FB Neck ROM: Full    Dental  (+) Dental Advisory Given   Pulmonary neg pulmonary ROS   breath sounds clear to auscultation       Cardiovascular hypertension, Pt. on medications and Pt. on home beta blockers (-) angina  Rhythm:Regular Rate:Normal     Neuro/Psych negative neurological ROS     GI/Hepatic Neg liver ROS,,,GI bleed/melena   Endo/Other  negative endocrine ROS    Renal/GU negative Renal ROS     Musculoskeletal  (+) Arthritis ,    Abdominal   Peds  Hematology Receiving PRBCs for Hb 6.1   Anesthesia Other Findings   Reproductive/Obstetrics                             Anesthesia Physical Anesthesia Plan  ASA: 3  Anesthesia Plan: MAC   Post-op Pain Management: Minimal or no pain anticipated   Induction:   PONV Risk Score and Plan: 2 and Treatment may vary due to age or medical condition  Airway Management Planned: Nasal Cannula and Natural Airway  Additional Equipment: None  Intra-op Plan:   Post-operative Plan:   Informed Consent: I have reviewed the patients History and Physical, chart, labs and discussed the procedure including the risks, benefits and alternatives for the proposed anesthesia with the patient or authorized representative who has indicated his/her understanding and acceptance.     Dental advisory given  Plan Discussed with: CRNA and Surgeon  Anesthesia Plan Comments:         Anesthesia Quick Evaluation

## 2024-01-03 NOTE — Progress Notes (Signed)
 PROGRESS NOTE  [name]  DOB: 1932-02-07  PCP: [name], MD AOZ:[id number]  DOA: 01/02/2024  LOS: 0 days  Hospital Day: 2  Brief narrative: [name] is a 89 y.o. female with PMH significant for HTN, HLD, previous right THA, remote left inguinal hernia repair and remote bowel resection 50 years ago for polyp, diverticulosis, chronic anemia, recurrent UTI 3/10, patient presented to the ED after 2 syncopal episodes, generalized weakness without any trauma to the head.  Recently hospitalized 3/7-3/9 for symptomatic anemia, given 2 units of blood transfusion.  Occult blood was negative it seems GI was not consulted at that time.  Discharged to follow-up with GI and hematology as an outpatient. Next morning on 3/10, patient felt acutely weak, lightheaded Passed out in the bathroom, did not hit her head.  Now she also reported black stool for 2 days. Not on blood thinner, initiated aspirin  In the ED, patient was afebrile, heart rate in 100s, blood pressure in low 100s Labs with hemoglobin 6.8 which is a drop from 9.5 the previous day count 15.3 CT angio abdomen was obtained -Negative for active GI bleeding -Irregular masslike thickening at the gastric cardia and lesser curvature of stomach concerning for gastric malignancy -Colonic diverticulosis possible acute diverticulitis at the splenic flexure  2 units of PRBC transfusion was ordered Admitted to [facility] GI was consulted with tentative plan of EGD  Subjective: Patient was seen and examined this morning.  Present elderly Caucasian female.  Lying in bed.  Not in distress.   Was getting secondary to PRBC transfusion at the time Chart reviewed Afebrile, remains tachycardic to 100s, blood pressure in normal range  Assessment and plan: Acute GI bleeding Suspect gastric malignancy Presented with weakness, syncope, melena.  Noted to have low hemoglobin CT angio abdomen showed irregular gastric mass  concerning for malignancy GI following.  Noted plan of endoscopy today Continue PPI  Acute blood loss anemia Hemoglobin was low at 6.8 yesterday.  Blood transfusion ordered but not given???. Hemoglobin was further lower at 6.1 this morning. Given 2 units of PRBC transfusion today Continue to monitor Recent Labs    12/27/23 1545 12/30/23 1604 12/31/23 0737 12/31/23 1219 01/01/24 0610 01/02/24 1047 01/03/24 0333  HGB 10.7*   < > 10.0* 10.6* 9.5* 6.8* 6.1*  MCV 93.8   < > 89.3 93.8  --  95.0 92.1  VITAMINB12 775  --   --   --   --   --  563  FOLATE  --   --   --   --   --   --  24.8  FERRITIN  --   --   --   --   --   --  112  TIBC  --   --   --   --   --   --  239*  IRON  --   --   --   --   --   --  63  RETICCTPCT  --   --   --   --   --   --  4.6*   < > = values in this interval not displayed.   Syncope Likely secondary to sudden acute GI bleeding Continue to monitor in telemetry  Hypertension PTA meds-bisoprolol 5 mg daily, HCTZ 6.25 mg daily Currently both on hold.  GI bleeding.  Continue to monitor. IV hydralazine as needed  Neuropathy On gabapentin nightly.   Mobility: Encourage ambulation.Marland Kitchen  Pending PT eval  Goals of care   Code Status: Full Code     DVT prophylaxis:  SCDs Start: 01/02/24 1320   Antimicrobials: None Fluid: None Consultants: GI Family Communication: None at bedside  Status: Inpatient Level of care:  Telemetry Medical   Patient is from: Home Needs to continue in-hospital care: Pending EGD Anticipated d/c to: Pending clinical course, PT eval   Diet:  Diet Order             Diet NPO time specified Except for: Sips with Meds  Diet effective midnight           Diet NPO time specified Except for: Sips with Meds  Diet effective midnight                   Scheduled Meds:  [MAR Hold] sodium chloride   Intravenous Once   [MAR Hold] sodium chloride   Intravenous Once   [MAR Hold] gabapentin  100 mg Oral QHS   [MAR Hold]  pantoprazole (PROTONIX) IV  40 mg Intravenous Q12H   [MAR Hold] sodium chloride flush  3-10 mL Intravenous Q12H    PRN meds: [MAR Hold] acetaminophen **OR** [MAR Hold] acetaminophen, [MAR Hold] hydrALAZINE, [MAR Hold] ondansetron **OR** [MAR Hold] ondansetron (ZOFRAN) IV, [MAR Hold] sodium chloride flush   Infusions:    Antimicrobials: Anti-infectives (From admission, onward)    None       Objective: Vitals:   01/03/24 1248 01/03/24 1300  BP: 119/64 123/62  Pulse:  100  Resp: 18 18  Temp: 98.4 F (36.9 C) 98.8 F (37.1 C)  SpO2: 98% 100%    Intake/Output Summary (Last 24 hours) at 01/03/2024 1405 Last data filed at 01/02/2024 2152 Gross per 24 hour  Intake 13 ml  Output 0 ml  Net 13 ml   Filed Weights   01/02/24 1734  Weight: 57.8 kg   Weight change:  Body mass index is 21.2 kg/m.   Physical Exam: General exam: Pleasant, elderly Caucasian female.  Not in distress Skin: No rashes, lesions or ulcers. HEENT: Atraumatic, normocephalic, no obvious bleeding Lungs: Clear to auscultation bilaterally,  CVS: S1, S2, no murmur,   GI/Abd: Soft, nontender, nondistended, bowel sound present,   CNS: Alert, awake, oriented x 3 Psychiatry: Mood appropriate,  Extremities: No pedal edema, no calf tenderness,   Data Review: I have personally reviewed the laboratory data and studies available.  F/u labs  Unresulted Labs (From admission, onward)     Start     Ordered   01/04/24 0500  Basic metabolic panel  Daily,   R      01/03/24 1405   01/04/24 0500  CBC with Differential/Platelet  Daily,   R      01/03/24 1405            Admission date and time: 01/02/2024 10:35 AM   Total time spent in review of labs and imaging, patient evaluation, formulation of plan, documentation and communication with family: 55 minutes  Signed, Lorin Glass, MD [facility] 01/03/2024

## 2024-01-03 NOTE — Op Note (Signed)
 [facility] Patient Name: [name] Procedure Date : 01/03/2024 MRN: [id number] Attending MD: Dub Amis. Tomasa Rand , MD, [id number] Date of Birth: 01-25-1932 CSN: [id number] Age: 89 Admit Type: Inpatient Procedure:                Upper GI endoscopy Indications:              Acute post hemorrhagic anemia, Melena Providers:                Lorin Picket E. Tomasa Rand, MD, Jacquelyn "Jaci" Clelia Croft, RN,                            Beryle Beams, Technician, Patrecia Pour CRNA Referring MD:              Medicines:                Monitored Anesthesia Care Complications:            No immediate complications. Estimated Blood Loss:     Estimated blood loss was minimal. Procedure:                Pre-Anesthesia Assessment:                           - Prior to the procedure, a History and Physical                            was performed, and patient medications and                            allergies were reviewed. The patient's tolerance of                            previous anesthesia was also reviewed. The risks                            and benefits of the procedure and the sedation                            options and risks were discussed with the patient.                            All questions were answered, and informed consent                            was obtained. Prior Anticoagulants: The patient has                            taken no anticoagulant or antiplatelet agents. ASA                            Grade Assessment: III - A patient with severe                            systemic disease. After reviewing the risks and  benefits, the patient was deemed in satisfactory                            condition to undergo the procedure.                           After obtaining informed consent, the endoscope was                            passed under direct vision. Throughout the                            procedure, the patient's blood  pressure, pulse, and                            oxygen saturations were monitored continuously. The                            [id number]-H190 ([id number]) Olympus endoscope was introduced                            through the mouth, and advanced to the second part                            of duodenum. The upper GI endoscopy was                            accomplished without difficulty. The patient                            tolerated the procedure well. Scope In: Scope Out: Findings:      The examined portions of the nasopharynx, oropharynx and larynx were       normal.      The examined esophagus was normal.      Scattered severe mucosal changes characterized by friability (with       contact bleeding), nodularity and ulceration were found in the gastric       body, along the greater and lesser curvatures. The ulcers along the       lesser curvature were larger and more prominent, and extended most of       the length of the lesser curvature. The antrum, cardia and fundus were       spared. Some of the ulcers were long and linear, while others were       rounded and heaped. Biopsies were taken with a cold forceps for       histology. The mucosa was firm. Estimated blood loss was minimal.      Normal mucosa was found in the gastric antrum. Biopsies were taken with       a cold forceps for Helicobacter pylori testing. Estimated blood loss was       minimal.      The examined duodenum was normal. Impression:               - The examined portions of the nasopharynx,  oropharynx and larynx were normal.                           - Normal esophagus.                           - Friable (with contact bleeding), nodular and                            ulcerated mucosa in the greater and lesser                            curvature of the gastric body. Biopsied. This is                            concerning for possible malignancy. No active                             bleeding or high risk bleeding stigmata were seen.                           - Normal mucosa was found in the antrum. Biopsied.                           - Normal examined duodenum. Moderate Sedation:      N/A Recommendation:           - Return patient to hospital ward for ongoing care.                           - Resume regular diet.                           - Use Protonix (pantoprazole) 40 mg PO daily.                           - Use sucralfate tablets 1 gram PO BID.                           - Await pathology results.                           - Trend CBC daily                           - Further recommendations will be based on                            pathology results. Procedure Code(s):        --- Professional ---                           [phone], Esophagogastroduodenoscopy, flexible,                            transoral; with biopsy, single or multiple Diagnosis Code(s):        ---  Professional ---                           K92.2, Gastrointestinal hemorrhage, unspecified                           K31.89, Other diseases of stomach and duodenum                           K25.9, Gastric ulcer, unspecified as acute or                            chronic, without hemorrhage or perforation                           D62, Acute posthemorrhagic anemia                           K92.1, Melena (includes Hematochezia) CPT copyright 2022 American [employer] Association. All rights reserved. The codes documented in this report are preliminary and upon coder review may  be revised to meet current compliance requirements. Amber Guthridge E. Tomasa Rand, MD 01/03/2024 3:18:47 PM This report has been signed electronically. Number of Addenda: 0

## 2024-01-03 NOTE — Anesthesia Postprocedure Evaluation (Signed)
 Anesthesia Post Note  Patient: [name]  Procedure(s) Performed: EGD (ESOPHAGOGASTRODUODENOSCOPY) BIOPSY, gastric     Patient location during evaluation: Endoscopy Anesthesia Type: MAC Level of consciousness: awake and alert Pain management: pain level controlled Vital Signs Assessment: post-procedure vital signs reviewed and stable Respiratory status: spontaneous breathing, nonlabored ventilation, respiratory function stable and patient connected to nasal cannula oxygen Cardiovascular status: blood pressure returned to baseline and stable Postop Assessment: no apparent nausea or vomiting Anesthetic complications: no  No notable events documented.  Last Vitals:  Vitals:   01/03/24 1525 01/03/24 1530  BP:  117/63  Pulse: 83 82  Resp: (!) 21 (!) 22  Temp:    SpO2: 100% 97%    Last Pain:  Vitals:   01/03/24 1513  TempSrc: Temporal  PainSc: 0-No pain                 [name]

## 2024-01-04 DIAGNOSIS — K921 Melena: Secondary | ICD-10-CM | POA: Diagnosis not present

## 2024-01-04 DIAGNOSIS — K259 Gastric ulcer, unspecified as acute or chronic, without hemorrhage or perforation: Secondary | ICD-10-CM | POA: Diagnosis not present

## 2024-01-04 DIAGNOSIS — D62 Acute posthemorrhagic anemia: Secondary | ICD-10-CM | POA: Diagnosis not present

## 2024-01-04 DIAGNOSIS — R55 Syncope and collapse: Secondary | ICD-10-CM | POA: Diagnosis not present

## 2024-01-04 LAB — TYPE AND SCREEN
ABO/RH(D): A POS
Antibody Screen: NEGATIVE
Unit division: 0
Unit division: 0

## 2024-01-04 LAB — BPAM RBC
Blood Product Expiration Date: 202504062359
Blood Product Unit Number: 202504062359
ISSUE DATE / TIME: 202503111244
PRODUCT CODE: 202503111003
PRODUCT CODE: 202504062359
Unit Type and Rh: 202504062359
Unit Type and Rh: 6200
Unit Type and Rh: 6200
Unit Type and Rh: 6200

## 2024-01-04 LAB — CBC WITH DIFFERENTIAL/PLATELET
Abs Immature Granulocytes: 0.07 10*3/uL (ref 0.00–0.07)
Basophils Absolute: 0.1 10*3/uL (ref 0.0–0.1)
Basophils Relative: 1 %
Eosinophils Absolute: 0.3 10*3/uL (ref 0.0–0.5)
Eosinophils Relative: 3 %
HCT: 25.3 % — ABNORMAL LOW (ref 36.0–46.0)
Hemoglobin: 8.7 g/dL — ABNORMAL LOW (ref 12.0–15.0)
Immature Granulocytes: 1 %
Lymphocytes Relative: 23 %
Lymphs Abs: 2.1 10*3/uL (ref 0.7–4.0)
MCH: 30.6 pg (ref 26.0–34.0)
MCHC: 34.4 g/dL (ref 30.0–36.0)
MCV: 89.1 fL (ref 80.0–100.0)
Monocytes Absolute: 0.8 10*3/uL (ref 0.1–1.0)
Monocytes Relative: 9 %
Neutro Abs: 5.7 10*3/uL (ref 1.7–7.7)
Neutrophils Relative %: 63 %
Platelets: 136 10*3/uL — ABNORMAL LOW (ref 150–400)
RBC: 2.84 MIL/uL — ABNORMAL LOW (ref 3.87–5.11)
RDW: 15.7 % — ABNORMAL HIGH (ref 11.5–15.5)
WBC: 9 10*3/uL (ref 4.0–10.5)
nRBC: 0.2 % (ref 0.0–0.2)

## 2024-01-04 LAB — BASIC METABOLIC PANEL
Anion gap: 5 (ref 5–15)
BUN: 31 mg/dL — ABNORMAL HIGH (ref 8–23)
CO2: 21 mmol/L — ABNORMAL LOW (ref 22–32)
Calcium: 7.4 mg/dL — ABNORMAL LOW (ref 8.9–10.3)
Chloride: 110 mmol/L (ref 98–111)
Creatinine, Ser: 0.72 mg/dL (ref 0.44–1.00)
GFR, Estimated: >60 mL/min (ref >60)
Glucose, Bld: 99 mg/dL (ref 70–99)
Potassium: 3.5 mmol/L (ref 3.5–5.1)
Sodium: 136 mmol/L (ref 135–145)

## 2024-01-04 LAB — CBC
HCT: 29.4 % — ABNORMAL LOW (ref 36.0–46.0)
Hemoglobin: 9.8 g/dL — ABNORMAL LOW (ref 12.0–15.0)
MCH: 30.1 pg (ref 26.0–34.0)
MCHC: 33.3 g/dL (ref 30.0–36.0)
MCV: 90.2 fL (ref 80.0–100.0)
Platelets: 165 10*3/uL (ref 150–400)
RBC: 3.26 MIL/uL — ABNORMAL LOW (ref 3.87–5.11)
RDW: 15.8 % — ABNORMAL HIGH (ref 11.5–15.5)
WBC: 9.2 10*3/uL (ref 4.0–10.5)
nRBC: 0 % (ref 0.0–0.2)

## 2024-01-04 MED ORDER — PANTOPRAZOLE SODIUM 40 MG PO TBEC
40.0000 mg | DELAYED_RELEASE_TABLET | Freq: Two times a day (BID) | ORAL | Status: DC
  Administered 2024-01-04 – 2024-01-05 (×3): 40 mg via ORAL
  Filled 2024-01-04 (×3): qty 1

## 2024-01-04 MED ORDER — BISOPROLOL FUMARATE 5 MG PO TABS
5.0000 mg | ORAL_TABLET | Freq: Every day | ORAL | Status: DC
  Administered 2024-01-04 – 2024-01-05 (×2): 5 mg via ORAL
  Filled 2024-01-04 (×2): qty 1

## 2024-01-04 NOTE — Evaluation (Signed)
 Physical Therapy Evaluation  Patient Details Name: [name] MRN: [id number] DOB: 1932/04/10 Today's Date: 01/04/2024  History of Present Illness  Pt is an 89 y/o female who presents 01/02/2024 s/p syncope. She was found to have symptomatic anemia, and extensive ulceration of the gastric body on EGD 3/11 - findings concerning for malignancy. PMH significant for   diverticulosis, HTN, inguinal hernia s/p repair, IBS, osteoporosis, cervical fusion, R THA and revision, L shoulder surgery, lumbar laminectomy, lumbar fusion.   Clinical Impression  Pt admitted with above diagnosis. Pt currently with functional limitations due to the deficits listed below (see PT Problem List). At the time of PT eval pt was able to perform transfers with modified independence and ambulation with HHA progressing to no UE support. Pt is likely near baseline of function but is at risk for functional decline without continued mobilization. See orthostatics below. Pt will benefit from acute skilled PT to increase their independence and safety with mobility to allow discharge.       Orthostatic BPs  Supine 149/71  Sitting 134/81  Standing 154/82  Standing after ambulation 165/83         If plan is discharge home, recommend the following: A little help with walking and/or transfers;A little help with bathing/dressing/bathroom;Assistance with cooking/housework;Assist for transportation;Help with stairs or ramp for entrance   Can travel by private vehicle        Equipment Recommendations None recommended by PT  Recommendations for Other Services       Functional Status Assessment Patient has had a recent decline in their functional status and demonstrates the ability to make significant improvements in function in a reasonable and predictable amount of time.     Precautions / Restrictions Precautions Precautions: Fall Recall of Precautions/Restrictions: Intact Restrictions Weight Bearing  Restrictions Per Provider Order: No      Mobility  Bed Mobility Overal bed mobility: Modified Independent             General bed mobility comments: Increased time    Transfers Overall transfer level: Modified independent Equipment used: None               General transfer comment: Increased time. No LOB    Ambulation/Gait Ambulation/Gait assistance: Contact guard assist Gait Distance (Feet): 250 Feet Assistive device: 1 person hand held assist, None Gait Pattern/deviations: Step-through pattern, Trunk flexed Gait velocity: Decreased Gait velocity interpretation: 1.31 - 2.62 ft/sec, indicative of limited community ambulator   General Gait Details: Slow but generally steady. HHA initially and pt progressed to no AD/UE support.  Stairs            Wheelchair Mobility     Tilt Bed    Modified Rankin (Stroke Patients Only)       Balance Overall balance assessment: Mild deficits observed, not formally tested                                           Pertinent Vitals/Pain Pain Assessment Pain Assessment: No/denies pain    Home Living Family/patient expects to be discharged to:: Private residence Living Arrangements: Spouse/significant other Available Help at Discharge: Family;Available 24 hours/day Type of Home: House Home Access: Stairs to enter Entrance Stairs-Rails: None Entrance Stairs-Number of Steps: 2   Home Layout: One level Home Equipment: Cane - single Librarian, academic (2 wheels);BSC/3in1 (Rails for the commode but doesnt sound like a toilet  riser.) Additional Comments: Husband still works - drives cars for [employer] and is out of the house almost every day. Daughter can be available when he is out.    Prior Function Prior Level of Function : Independent/Modified Independent;Driving             Mobility Comments: No AD ADLs Comments: Independent     Extremity/Trunk Assessment   Upper Extremity  Assessment Upper Extremity Assessment: Overall WFL for tasks assessed    Lower Extremity Assessment Lower Extremity Assessment: Overall WFL for tasks assessed    Cervical / Trunk Assessment Cervical / Trunk Assessment: Other exceptions Cervical / Trunk Exceptions: Forward head posture with rounded shoulders  Communication   Communication Communication: No apparent difficulties    Cognition Arousal: Alert     PT - Cognitive impairments: No apparent impairments                         Following commands: Intact       Cueing       General Comments      Exercises     Assessment/Plan    PT Assessment Patient needs continued PT services  PT Problem List Decreased activity tolerance       PT Treatment Interventions DME instruction;Gait training;Stair training;Functional mobility training;Therapeutic activities;Therapeutic exercise;Balance training;Patient/family education    PT Goals (Current goals can be found in the Care Plan section)  Acute Rehab PT Goals Patient Stated Goal: home today PT Goal Formulation: With patient Time For Goal Achievement: 01/11/24 Potential to Achieve Goals: Good    Frequency Min 2X/week     Co-evaluation               AM-PAC PT "6 Clicks" Mobility  Outcome Measure Help needed turning from your back to your side while in a flat bed without using bedrails?: None Help needed moving from lying on your back to sitting on the side of a flat bed without using bedrails?: None Help needed moving to and from a bed to a chair (including a wheelchair)?: A Little Help needed standing up from a chair using your arms (e.g., wheelchair or bedside chair)?: A Little Help needed to walk in hospital room?: A Little Help needed climbing 3-5 steps with a railing? : A Little 6 Click Score: 20    End of Session Equipment Utilized During Treatment: Gait belt Activity Tolerance: Patient tolerated treatment well Patient left: in  chair;with chair alarm set;with call bell/phone within reach Nurse Communication: Mobility status PT Visit Diagnosis: Unsteadiness on feet (R26.81);Difficulty in walking, not elsewhere classified (R26.2)    Time: 1130-1156 PT Time Calculation (min) (ACUTE ONLY): 26 min   Charges:   PT Evaluation $PT Eval Moderate Complexity: 1 Mod PT Treatments $Gait Training: 8-22 mins PT General Charges $$ ACUTE PT VISIT: 1 Visit         Conni Slipper, PT, DPT Acute Rehabilitation Services Secure Chat Preferred Office: [phone]   Marylynn Pearson 01/04/2024, 12:21 PM

## 2024-01-04 NOTE — Plan of Care (Signed)
  Problem: Clinical Measurements: Goal: Ability to maintain clinical measurements within normal limits will improve Outcome: Progressing Goal: Will remain free from infection Outcome: Progressing   Problem: Activity: Goal: Risk for activity intolerance will decrease Outcome: Progressing   Problem: Nutrition: Goal: Adequate nutrition will be maintained Outcome: Progressing   Problem: Pain Managment: Goal: General experience of comfort will improve and/or be controlled Outcome: Progressing   Problem: Safety: Goal: Ability to remain free from injury will improve Outcome: Progressing   Problem: Skin Integrity: Goal: Risk for impaired skin integrity will decrease Outcome: Progressing

## 2024-01-04 NOTE — Progress Notes (Signed)
 [name]  JYN:[id number] DOB: August 21, 1932 DOA: 01/02/2024 PCP: [name], MD    Brief Narrative:  89 year old with a history of HTN, HLD, R THA, diverticulosis, recurrent UTIs, chronic anemia, and remote bowel resection for polyp who presented to the ED following 2 syncopal spells with severe generalized weakness after a recent hospitalization March 7>9 for symptomatic anemia which required transfusion of 2 units of blood.  During the prior admission hemoccult was negative.  The day following her discharge home she had an episode of acute severe weakness with lightheadedness and passed out while in the bathroom.  She stated she had had black stools upon returning home.  Hemoglobin was 6.8 at presentation, down from 9.5 at the time of her discharge.  CTa of the abdomen was completed without evidence of active bleeding but did reveal an irregular masslike thickening at the gastric cardia and lesser curvature of the stomach concerning for malignancy.    Goals of Care:   Code Status: Full Code   DVT prophylaxis: SCDs Start: 01/02/24 1320   Interim Hx: Afebrile.  Vital signs stable.  Hemoglobin has improved to 8.7.  BUN rapidly improved to 31.  Resting comfortably in bed.  Has no new complaints.  Alert and conversant.  Assessment & Plan:  Acute blood loss anemia -acute GI bleed Hemoglobin reached a nadir of 6.1 with 2 units PRBC transfused 3/11 -EGD revealed friable ulcerated mucosa as the source of bleeding - monitor hemoglobin overnight and if remains stable will be a candidate for discharge home 3/13  Irregular stomach parenchyma Initially noted on CTa abdomen - EGD 3/11 noted friable nodular and ulcerated mucosa in the greater and lesser curvature of the gastric body which was biopsied with otherwise normal esophagus and duodenum - continue BID Protonix for 8 weeks - continue Carafate twice daily for 2 weeks - pathology pending with GI to follow-up as  outpatient  Syncope Due to acute blood loss -resolved with blood transfusion  HTN Usual home BP meds on hold -monitor as blood pressure is beginning to increase status post transfusion  Chronic neuropathy Continue usual gabapentin dose    Family Communication: Spoke with husband of almost 70 years at bedside at length Disposition: Plan for discharge home 3/13 if hemoglobin remains stable   Objective: Blood pressure (!) 150/67, pulse 89, temperature 97.8 F (36.6 C), temperature source Oral, resp. rate 19, height 5\' 5"  (1.651 m), weight 57.8 kg, SpO2 98%.  Intake/Output Summary (Last 24 hours) at 01/04/2024 1655 Last data filed at 01/04/2024 1330 Gross per 24 hour  Intake 480 ml  Output 0 ml  Net 480 ml   Filed Weights   01/02/24 1734 01/03/24 1409  Weight: 57.8 kg 57.8 kg    Examination: General: No acute respiratory distress Lungs: Clear to auscultation bilaterally without wheezes or crackles Cardiovascular: Regular rate and rhythm without murmur gallop or rub normal S1 and S2 Abdomen: Nontender, nondistended, soft, bowel sounds positive, no rebound, no ascites, no appreciable mass Extremities: No significant cyanosis, clubbing, or edema bilateral lower extremities  CBC: Recent Labs  Lab 01/02/24 1047 01/03/24 0333 01/03/24 1830 01/04/24 0345 01/04/24 1456  WBC 15.3*   < > 9.5 9.0 9.2  NEUTROABS 12.9*  --   --  5.7  --   HGB 6.8*   < > 8.7* 8.7* 9.8*  HCT 20.9*   < > 25.6* 25.3* 29.4*  MCV 95.0   < > 89.5 89.1 90.2  PLT 152   < >  138* 136* 165   < > = values in this interval not displayed.   Basic Metabolic Panel: Recent Labs  Lab 01/02/24 1601 01/03/24 0333 01/04/24 0345  NA 134* 136 136  K 4.2 3.8 3.5  CL 104 108 110  CO2 19* 24 21*  GLUCOSE 139* 110* 99  BUN 66* 61* 31*  CREATININE 0.80 0.94 0.72  CALCIUM 8.1* 7.8* 7.4*   GFR: Estimated Creatinine Clearance: 42.9 mL/min (by C-G formula based on SCr of 0.72 mg/dL).   Scheduled Meds:   gabapentin  100 mg Oral QHS   pantoprazole  40 mg Oral BID   sodium chloride flush  3-10 mL Intravenous Q12H   sucralfate  1 g Oral TID WC & HS      LOS: 1 day   Lonia Blood, MD [facility] Office  [phone] Pager - Text Page per Loretha Stapler  If 7PM-7AM, please contact night-coverage per Amion 01/04/2024, 4:55 PM

## 2024-01-04 NOTE — Progress Notes (Signed)
 [facility] ROUNDING NOTE   Subjective: Patient feeling well this morning, tolerating diet.  No bowel movements since coming to the hospital.  Hemoglobin stable this morning at 8.7, BUN improved from 60 to 30.   Objective: Vital signs in last 24 hours: Temp:  [97.3 F (36.3 C)-98.8 F (37.1 C)] 98.1 F (36.7 C) (03/12 0752) Pulse Rate:  [81-104] 104 (03/12 0752) Resp:  [17-30] 18 (03/12 0752) BP: (111-131)/(52-76) 131/67 (03/12 0752) SpO2:  [96 %-100 %] 97 % (03/12 0752) Weight:  [57.8 kg] 57.8 kg (03/11 1409) Last BM Date : 12/31/23 General: NAD, pleasant elderly Caucasian female Lungs:  CTA b/l, no w/r/r Heart:  RRR, no m/r/g Abdomen:  Soft, NT, ND, +BS   Intake/Output from previous day: 03/11 0701 - 03/12 0700 In: 754.5 [I.V.:400; Blood:354.5] Out: -  Intake/Output this shift: No intake/output data recorded.   Lab Results: Recent Labs    01/03/24 0333 01/03/24 1830 01/04/24 0345  WBC 13.1* 9.5 9.0  HGB 6.1* 8.7* 8.7*  PLT 156 138* 136*  MCV 92.1 89.5 89.1   BMET Recent Labs    01/02/24 1601 01/03/24 0333 01/04/24 0345  NA 134* 136 136  K 4.2 3.8 3.5  CL 104 108 110  CO2 19* 24 21*  GLUCOSE 139* 110* 99  BUN 66* 61* 31*  CREATININE 0.80 0.94 0.72  CALCIUM 8.1* 7.8* 7.4*   LFT Recent Labs    01/02/24 1601 01/03/24 0333  PROT 5.1* 4.4*  ALBUMIN 2.7* 2.4*  AST 21 19  ALT 13 12  ALKPHOS 31* 25*  BILITOT 0.6 0.5   PT/INR Recent Labs    01/02/24 1047  INR 1.2      Imaging/Other results: CT ANGIO GI BLEED Result Date: 01/02/2024 CLINICAL DATA:  Lower GI bleeding EXAM: CTA ABDOMEN AND PELVIS WITHOUT AND WITH CONTRAST TECHNIQUE: Multidetector CT imaging of the abdomen and pelvis was performed using the standard protocol during bolus administration of intravenous contrast. Multiplanar reconstructed images and MIPs were obtained and reviewed to evaluate the vascular anatomy. RADIATION DOSE REDUCTION: This exam was performed  according to the departmental dose-optimization program which includes automated exposure control, adjustment of the mA and/or kV according to patient size and/or use of iterative reconstruction technique. CONTRAST:  75mL OMNIPAQUE IOHEXOL 350 MG/ML SOLN COMPARISON:  CT 11/16/2020 FINDINGS: VASCULAR Aorta: Normal caliber aorta without aneurysm, dissection, vasculitis or significant stenosis. Moderate atherosclerosis Celiac: At least moderate focal stenosis at the origin of the celiac trunk. Distal patency. No aneurysm, dissection or occlusion SMA: Patent without evidence of aneurysm, dissection, vasculitis or significant stenosis. Renals: Calcification at the origin of right renal artery without significant stenosis. Both dominant renal arteries are patent and without significant aneurysm dissection or stenosis. There are tiny lower pole accessory arteries bilaterally IMA: Diminutive but patent Inflow: Patent without evidence of aneurysm, dissection, vasculitis or significant stenosis. Proximal Outflow: Bilateral common femoral and visualized portions of the superficial and profunda femoral arteries are patent without evidence of aneurysm, dissection, vasculitis or significant stenosis. Veins: Portal and splenic veins are patent. Review of the MIP images confirms the above findings. NON-VASCULAR Lower chest: Lung bases demonstrate mild scarring at the bases. No acute airspace disease Hepatobiliary: Subcentimeter hypodensity in the right hepatic lobe too small to further characterize but unchanged and therefore likely benign. No gallstones, gallbladder wall thickening, or biliary dilatation. Pancreas: Unremarkable. No pancreatic ductal dilatation or surrounding inflammatory changes. Spleen: Normal in size without focal abnormality. Adrenals/Urinary Tract: Adrenal glands are normal. Kidneys show  no hydronephrosis. The bladder is unremarkable Stomach/Bowel: Irregular masslike thickening at the gastric cardia and lesser  curvature of stomach, series 14, image 17 through 34 and coronal series 17 image 90. No dilated small bowel. Diverticular disease of the colon. Possible minimal fat stranding and wall thickening at the splenic flexure/proximal descending colon as may be seen with mild diverticulitis. No intraluminal extravasation to suggest active GI bleeding on this exam. Lymphatic: No suspicious lymph nodes Reproductive: Uterus is atrophic.  2.2 cm left adnexal cyst. Other: Negative for pelvic effusion or free air Musculoskeletal: Right hip replacement. Multilevel degenerative changes. Mild chronic superior endplate deformity at T12. question mild posterior cortical erosive change at the L2 vertebral body, sagittal series 18, image 108. IMPRESSION: 1. Negative for active GI bleeding. Diverticular disease of the colon with possible minimal wall thickening and fat stranding at the splenic flexure/proximal descending colon as may be seen with an acute diverticulitis. 2. Irregular masslike thickening at the gastric cardia and lesser curvature of stomach, concern raised for gastric malignancy. Correlation with endoscopy is recommended 3. Moderate atherosclerosis of the aorta with at least moderate stenosis at the origin of the celiac trunk. Negative for dissection or occlusive disease 4. Question mild posterior cortical erosive change at the L2 vertebral body. This could be further assessed with MRI 5. Aortic atherosclerosis. Electronically Signed   By: Jasmine Pang M.D.   On: 01/02/2024 16:45   CT Head Wo Contrast Result Date: 01/02/2024 CLINICAL DATA:  Head trauma, minor (Age >= 65y). EXAM: CT HEAD WITHOUT CONTRAST TECHNIQUE: Contiguous axial images were obtained from the base of the skull through the vertex without intravenous contrast. RADIATION DOSE REDUCTION: This exam was performed according to the departmental dose-optimization program which includes automated exposure control, adjustment of the mA and/or kV according to  patient size and/or use of iterative reconstruction technique. COMPARISON:  None Available. FINDINGS: Brain: There is no evidence of an acute infarct, intracranial hemorrhage, mass, midline shift, or extra-axial fluid collection. Mild cerebral atrophy is within normal limits for age. Cerebral white matter hypodensities are nonspecific but compatible with mild chronic small vessel ischemic disease. Vascular: No hyperdense vessel or unexpected calcification. Skull: No acute fracture or suspicious lesion. Sinuses/Orbits: Visualized paranasal sinuses and mastoid air cells are clear. Unremarkable included orbits. Other: None. IMPRESSION: 1. No evidence of acute intracranial abnormality. 2. Mild chronic small vessel ischemic disease. Electronically Signed   By: Sebastian Ache M.D.   On: 01/02/2024 15:56      Assessment and Plan:  89 year old female admitted with symptomatic anemia, melena, syncope, found to have extensive ulceration of the gastric body with nodular mucosa on EGD March 11.  Findings concerning for malignancy.  No active bleeding, and no high risk stigmata noted. No evidence of recurrent bleeding based on symptoms and hemoglobin.  Downtrending BUN also suggestive of resolved bleeding. Patient okay for discharge from GI bleeding standpoint.  She should be discharged on twice daily PPI for at least 8 weeks, and Carafate tablets twice daily for 2 weeks. Will follow-up on pathology results as outpatient.  Upper GI bleed secondary to gastric ulcers - Endoscopic findings suspicious for malignancy, biopsies pending - Okay to discharge from GI standpoint - Please discharge on twice daily PPI for at least 8 weeks - Recommend Carafate 2-4 times per day for at least 2 weeks - Will follow-up on biopsies as outpatient - If no malignancy noted on biopsies, patient will need repeat upper endoscopy in 8 weeks to assess resolution of  ulcers -Avoid NSAIDs indefinitely      Jenel Lucks, MD   01/04/2024, 9:03 AM [facility]

## 2024-01-04 NOTE — Consult Note (Signed)
 [facility] Liaison Consult Note   01/04/2024  [name] 1931-11-11 [id number]  Insurance: Medicare ACO REACH  Primary Care Provider: [name], MD with [facility] [place], this provider is listed for the transition of care follow up appointments  and [facility] calls   [facility] Liaison met patient at bedside at [facility].  Patient is working with therapy on rounds, will continue to follow.    The patient was screened for less than 7 day readmission hospitalization with noted medium risk score for unplanned readmission risk 2 hospital admissions in 6 months.  The patient was assessed for potential [facility] Coordination service needs for post hospital transition for care coordination. Review of patient's electronic medical record reveals patient is admitted with symptomatic anemia and s/p EGD on 01/03/24.   Plan: [facility] Liaison will continue to follow progress and disposition to asess for post hospital community care coordination/management needs.  Referral request for community care coordination: Will review PT eval for potential needs, SDOH intact. Anticipate post hospital follow up with [facility] TOC team with no additional needs noted.   [facility] Community Care, Population Health does not replace or interfere with any arrangements made by the Inpatient Transition of Care team.   For questions contact:   Charlesetta Shanks, RN, BSN, CCM [place]  [facility] Liaison Direct Dial: [phone] or secure chat Email: [email or web address]

## 2024-01-05 ENCOUNTER — Other Ambulatory Visit (HOSPITAL_COMMUNITY): Payer: Self-pay

## 2024-01-05 ENCOUNTER — Encounter (HOSPITAL_COMMUNITY): Payer: Self-pay | Admitting: Gastroenterology

## 2024-01-05 DIAGNOSIS — R55 Syncope and collapse: Secondary | ICD-10-CM | POA: Diagnosis not present

## 2024-01-05 LAB — BASIC METABOLIC PANEL
Anion gap: 7 (ref 5–15)
BUN: 15 mg/dL (ref 8–23)
CO2: 22 mmol/L (ref 22–32)
Calcium: 7.2 mg/dL — ABNORMAL LOW (ref 8.9–10.3)
Chloride: 109 mmol/L (ref 98–111)
Creatinine, Ser: 0.74 mg/dL (ref 0.44–1.00)
GFR, Estimated: >60 mL/min (ref >60)
Glucose, Bld: 99 mg/dL (ref 70–99)
Potassium: 3.3 mmol/L — ABNORMAL LOW (ref 3.5–5.1)
Sodium: 138 mmol/L (ref 135–145)

## 2024-01-05 LAB — CBC
HCT: 27.4 % — ABNORMAL LOW (ref 36.0–46.0)
Hemoglobin: 9.4 g/dL — ABNORMAL LOW (ref 12.0–15.0)
MCH: 30.9 pg (ref 26.0–34.0)
MCHC: 34.3 g/dL (ref 30.0–36.0)
MCV: 90.1 fL (ref 80.0–100.0)
Platelets: 163 10*3/uL (ref 150–400)
RBC: 3.04 MIL/uL — ABNORMAL LOW (ref 3.87–5.11)
RDW: 15.6 % — ABNORMAL HIGH (ref 11.5–15.5)
WBC: 8.2 10*3/uL (ref 4.0–10.5)
nRBC: 0 % (ref 0.0–0.2)

## 2024-01-05 MED ORDER — SUCRALFATE 1 G PO TABS
1.0000 g | ORAL_TABLET | Freq: Three times a day (TID) | ORAL | 0 refills | Status: DC
  Filled 2024-01-05: qty 48, 12d supply, fill #0

## 2024-01-05 MED ORDER — ACETAMINOPHEN 325 MG PO TABS: 650.0000 mg | ORAL_TABLET | Freq: Four times a day (QID) | ORAL | Status: AC | PRN

## 2024-01-05 MED ORDER — BISOPROLOL FUMARATE 5 MG PO TABS
5.0000 mg | ORAL_TABLET | Freq: Every day | ORAL | 1 refills | Status: DC
  Filled 2024-01-05: qty 60, 60d supply, fill #0

## 2024-01-05 MED ORDER — PANTOPRAZOLE SODIUM 40 MG PO TBEC
40.0000 mg | DELAYED_RELEASE_TABLET | Freq: Two times a day (BID) | ORAL | 0 refills | Status: DC
  Filled 2024-01-05: qty 60, 30d supply, fill #0

## 2024-01-05 NOTE — Progress Notes (Signed)
 Mobility Specialist Progress Note:    01/05/24 0900  Orthostatic Lying   BP- Lying 114/80  Orthostatic Sitting  BP- Sitting 129/73  Orthostatic Standing at 0 minutes  BP- Standing at 0 minutes 130/72  Mobility  Activity Ambulated with assistance in hallway  Level of Assistance Contact guard assist, steadying assist  Assistive Device None  Distance Ambulated (ft) 180 ft  Activity Response Tolerated well  Mobility Referral Yes  Mobility visit 1 Mobility  Mobility Specialist Start Time (ACUTE ONLY) 0844  Mobility Specialist Stop Time (ACUTE ONLY) 0854  Mobility Specialist Time Calculation (min) (ACUTE ONLY) 10 min   Received pt in bed having no complaints and agreeable to mobility. Orthostatic vitals taken, negative. Pt was asymptomatic throughout ambulation and returned to room w/o fault. Left on EOB w/ call bell in reach and all needs met.   [name] Mobility Specialist Please contact via Special educational needs teacher or Rehab office at [phone]

## 2024-01-05 NOTE — Plan of Care (Signed)

## 2024-01-05 NOTE — Discharge Summary (Signed)
 DISCHARGE SUMMARY  [name]  MR#: [id number]  DOB:[birth year]  Date of Admission: 01/02/2024 Date of Discharge: 01/05/2024  Attending Physician:Abelino Tippin Silvestre Gunner, MD  Patient's WUJ:[id number], Elberta Fortis, MD  Disposition: D/C home   Follow-up Appts:  Follow-up Information     [name], MD Follow up in 1 week(s).   Specialty: Family Medicine Contact information: [address] [place] [address] [phone]         Jenel Lucks, MD Follow up.   Specialty: Gastroenterology Why: The office will arrange a follow up appointment and contact you. Contact information: [address] [place] [address] [phone]                 Tests Needing Follow-up: -Assess blood pressure control with alteration of usual home meds as detailed below -Recheck CBC -Pathology pending on biopsies obtained during EGD with GI service to follow-up  Discharge Diagnoses: Acute blood loss anemia -acute GI bleed Irregular stomach parenchyma - severe ulcerations v/s neoplasm Syncope HTN Chronic neuropathy  Initial presentation: 89 year old with a history of HTN, HLD, R THA, diverticulosis, recurrent UTIs, chronic anemia, and remote bowel resection for polyp who presented to the ED following 2 syncopal spells with severe generalized weakness after a recent hospitalization March 7>9 for symptomatic anemia which required transfusion of 2 units of blood.  During the prior admission hemoccult was negative.  The day following her discharge while at home she had an episode of acute severe weakness with lightheadedness and passed out while in the bathroom.  She stated she had had black stools upon returning home.  Hemoglobin was 6.8 at presentation, down from 9.5 at the time of her discharge.  CTa of the abdomen was completed without evidence of active bleeding but did reveal an irregular masslike thickening at the gastric cardia and lesser curvature of  the stomach concerning for malignancy.    Hospital Course:  Acute blood loss anemia -acute GI bleed Hemoglobin reached a nadir of 6.1 with 2 units PRBC transfused 3/11 - EGD revealed friable ulcerated mucosa as the source of bleeding -no active bleeding was noted at the time of the EGD, and the patient's hemoglobin subsequently remained stable -she was cleared for discharge on 3/13 with a stable hemoglobin - she will complete medical therapy as detailed below   Irregular stomach parenchyma Initially noted on CTa abdomen - EGD 3/11 noted friable nodular and ulcerated mucosa in the greater and lesser curvature of the gastric body which was biopsied with otherwise normal esophagus and duodenum - continue BID Protonix for 8 weeks - continue Carafate twice daily for 2 weeks - pathology pending with GI to follow-up as outpatient   Syncope Due to acute blood loss - resolved with blood transfusion   HTN Usual home BP meds were held initially in setting of active bleeding -following transfusion her blood pressure began to climb and therefore the bisoprolol component of her prior combination medication was resumed -she is being discharged on bisoprolol alone as I wish to avoid use of a diuretic and her current clinical condition   Chronic neuropathy Continue usual gabapentin dose  Allergies as of 01/05/2024       Reactions   Codeine Other (See Comments)   Headaches   Flagyl [metronidazole Hcl] Nausea Only        Medication List     STOP taking these medications    bisoprolol-hydrochlorothiazide 5-6.25 MG tablet Commonly known as: [employer]  TAKE these medications    acetaminophen 325 MG tablet Commonly known as: TYLENOL Take 2 tablets (650 mg total) by mouth every 6 (six) hours as needed for mild pain (pain score 1-3) (or Fever >/= 101).   bisoprolol 5 MG tablet Commonly known as: ZEBETA Take 1 tablet (5 mg total) by mouth daily. Start taking on: January 06, 2024   feeding  supplement Liqd Take 1 Container by mouth daily.   Fish Oil 1000 MG Caps Take 2 capsules (2,000 mg total) by mouth 2 (two) times daily. What changed: when to take this   gabapentin 100 MG capsule Commonly known as: NEURONTIN Take 1 capsule (100 mg total) by mouth at bedtime.   GenTeal Tears 0.1-0.2-0.3 % Soln Place 1 drop into both eyes at bedtime as needed (dry eye).   Glucosamine-Chondroitin 250-200 MG Tabs Take 1 tablet by mouth daily.   multivitamin with minerals Tabs tablet Take 1 tablet by mouth daily.   pantoprazole 40 MG tablet Commonly known as: PROTONIX Take 1 tablet (40 mg total) by mouth 2 (two) times daily.   sucralfate 1 GM/10ML suspension Commonly known as: CARAFATE Take 10 mLs (1 g total) by mouth 4 (four) times daily -  with meals and at bedtime.   Viactiv Calcium Plus D 650-12.5-40 MG-MCG Chew Generic drug: Calcium-Vitamin D-Vitamin K As directed What changed:  how much to take how to take this when to take this additional instructions   Vitamin D (Ergocalciferol) 1.25 MG (50000 UNIT) Caps capsule Commonly known as: DRISDOL Take 1 capsule (50,000 Units total) by mouth every 7 (seven) days. What changed: when to take this        Day of Discharge BP 135/67 (BP Location: Right Arm)   Pulse 83   Temp 98.2 F (36.8 C) (Oral)   Resp 19   Ht 5\' 5"  (1.651 m)   Wt 57.8 kg   SpO2 95%   BMI 21.20 kg/m   Physical Exam: General: No acute respiratory distress Lungs: Clear to auscultation bilaterally without wheezes or crackles Cardiovascular: Regular rate and rhythm without murmur gallop or rub normal S1 and S2 Abdomen: Nontender, nondistended, soft, bowel sounds positive, no rebound, no ascites, no appreciable mass Extremities: No significant cyanosis, clubbing, or edema bilateral lower extremities  Basic Metabolic Panel: Recent Labs  Lab 12/31/23 0737 01/02/24 1601 01/03/24 0333 01/04/24 0345 01/05/24 0346  NA 134* 134* 136 136 138  K  3.6 4.2 3.8 3.5 3.3*  CL 105 104 108 110 109  CO2 21* 19* 24 21* 22  GLUCOSE 102* 139* 110* 99 99  BUN 52* 66* 61* 31* 15  CREATININE 0.68 0.80 0.94 0.72 0.74  CALCIUM 7.8* 8.1* 7.8* 7.4* 7.2*    CBC: Recent Labs  Lab 01/02/24 1047 01/03/24 0333 01/03/24 1830 01/04/24 0345 01/04/24 1456 01/05/24 0346  WBC 15.3* 13.1* 9.5 9.0 9.2 8.2  NEUTROABS 12.9*  --   --  5.7  --   --   HGB 6.8* 6.1* 8.7* 8.7* 9.8* 9.4*  HCT 20.9* 18.6* 25.6* 25.3* 29.4* 27.4*  MCV 95.0 92.1 89.5 89.1 90.2 90.1  PLT 152 156 138* 136* 165 163    Time spent in discharge (includes decision making & examination of pt): 35 minutes  01/05/2024, 9:39 AM   Lonia Blood, MD [facility] Office  [phone]

## 2024-01-05 NOTE — Progress Notes (Signed)
 DISCHARGE NOTE HOME [name] to be discharged Home per MD order. Discussed prescriptions and follow up appointments with the patient. Prescriptions given to patient; medication list explained in detail. Patient verbalized understanding.  Skin clean, dry and intact without evidence of skin break down, no evidence of skin tears noted. IV catheter discontinued intact. Site without signs and symptoms of complications. Dressing and pressure applied. Pt denies pain at the site currently. No complaints noted.  Patient free of lines, drains, and wounds.   An After Visit Summary (AVS) was printed and given to the patient. Patient escorted via wheelchair,to d/c lounge and they will get her the [facility] meds  Irwin Brakeman, RN

## 2024-01-05 NOTE — TOC Transition Note (Signed)
 Transition of Care [facility]) - Discharge Note   Patient Details  Name: [name] MRN: [id number] Date of Birth: [birth year]  Transition of Care [facility]) CM/SW Contact:  Tom-Johnson, Hershal Coria, RN Phone Number: 01/05/2024, 12:39 PM   Clinical Narrative:     Patient is scheduled for discharge today.  Readmission Risk Assessment done. Outpatient f/u, hospital f/u and discharge instructions on AVS. Prescriptions sent to [facility] pharmacy and patient will receive meds prior discharge. No PT f/u, TOC needs or recommendations noted. Husband, Stephannie Peters to transport at discharge.  No further TOC needs noted.         Final next level of care: Home/Self Care Barriers to Discharge: Barriers Resolved   Patient Goals and CMS Choice Patient states their goals for this hospitalization and ongoing recovery are:: To return home CMS Medicare.gov Compare Post Acute Care list provided to:: Patient Choice offered to / list presented to : NA      Discharge Placement                Patient to be transferred to facility by: Husband Name of family member notified: [facility] and Services Additional resources added to the After Visit Summary for                  DME Arranged: N/A DME Agency: NA       HH Arranged: NA HH Agency: NA        Social Drivers of Health (SDOH) Interventions SDOH Screenings   Food Insecurity: No Food Insecurity (01/02/2024)  Housing: Low Risk  (01/02/2024)  Transportation Needs: No Transportation Needs (01/02/2024)  Utilities: Not At Risk (01/02/2024)  Alcohol Screen: Low Risk  (11/07/2023)  Depression (PHQ2-9): Low Risk  (11/07/2023)  Financial Resource Strain: Low Risk  (11/07/2023)  Physical Activity: Sufficiently Active (11/07/2023)  Social Connections: Socially Integrated (01/02/2024)  Stress: No Stress Concern Present (11/07/2023)  Tobacco Use: Low Risk  (01/02/2024)  Health Literacy: Adequate Health Literacy (11/07/2023)      Readmission Risk Interventions    01/05/2024   12:37 PM 01/03/2024    4:19 PM  Readmission Risk Prevention Plan  Post Dischage Appt Complete   Medication Screening Complete   Transportation Screening Complete Complete  PCP or Specialist Appt within 5-7 Days  Complete  Home Care Screening  Complete  Medication Review (RN CM)  Referral to Pharmacy

## 2024-01-06 ENCOUNTER — Telehealth: Payer: Self-pay

## 2024-01-06 NOTE — Transitions of Care (Post Inpatient/ED Visit) (Signed)
 01/06/2024  Name: [name] MRN: [id number] DOB: 11/08/1931  Today's TOC FU Call Status: Today's TOC FU Call Status:: Successful TOC FU Call Completed TOC FU Call Complete Date: 01/06/24 Patient's Name and Date of Birth confirmed.  Transition Care Management Follow-up Telephone Call Date of Discharge: 01/05/24 Discharge Facility: [facility]) Type of Discharge: Inpatient Admission Primary Inpatient Discharge Diagnosis:: anemia How have you been since you were released from the hospital?: Better Any questions or concerns?: No  Items Reviewed: Did you receive and understand the discharge instructions provided?: Yes Medications obtained,verified, and reconciled?: Yes (Medications Reviewed) Any new allergies since your discharge?: No Dietary orders reviewed?: Yes Do you have support at home?: Yes People in Home: facility resident  Medications Reviewed Today: Medications Reviewed Today     Reviewed by Karena Addison, LPN (Licensed Practical Nurse) on 01/06/24 at [phone]  Med List Status: <None>   Medication Order Taking? Sig Documenting Provider Last Dose Status Informant  acetaminophen (TYLENOL) 325 MG tablet [id number]  Take 2 tablets (650 mg total) by mouth every 6 (six) hours as needed for mild pain (pain score 1-3) (or Fever >/= 101). Lonia Blood, MD  Active   Artificial Tear Solution (GENTEAL TEARS) 0.1-0.2-0.3 % SOLN [id number] No Place 1 drop into both eyes at bedtime as needed (dry eye). [provider] Past Week Active Self, Multiple Informants, Pharmacy Records  bisoprolol (ZEBETA) 5 MG tablet [id number]  Take 1 tablet (5 mg total) by mouth daily. Lonia Blood, MD  Active   Calcium-Vitamin D-Vitamin K Kenna Gilbert CALCIUM PLUS D) 650-12.5-40 MG-MCG-MCG CHEW [id number] No As directed  Patient taking differently: Chew 1 tablet by mouth daily.   [name], MD Past Week Active Self, Multiple Informants, Pharmacy Records  feeding supplement  (BOOST HIGH PROTEIN) LIQD [id number] No Take 1 Container by mouth daily. [provider] Past Week Active Self, Multiple Informants, Pharmacy Records  gabapentin (NEURONTIN) 100 MG capsule [id number] No Take 1 capsule (100 mg total) by mouth at bedtime. [name], MD Past Week Active Self, Multiple Informants, Pharmacy Records           Med Note Donell Beers Jan 02, 2024  2:38 PM) LF: 12/27/23 for a 60ds  Glucosamine-Chondroitin 250-200 MG TABS [id number] No Take 1 tablet by mouth daily. [name], MD Past Week Active Self, Multiple Informants, Pharmacy Records  Multiple Vitamin (MULTIVITAMIN WITH MINERALS) TABS tablet [id number] No Take 1 tablet by mouth daily. [provider] Past Week Active Self, Multiple Informants, Pharmacy Records  Omega-3 Fatty Acids (FISH OIL) 1000 MG CAPS [id number] No Take 2 capsules (2,000 mg total) by mouth 2 (two) times daily.  Patient taking differently: Take 2,000 mg by mouth daily with breakfast.   [name], MD Past Week Active Self, Multiple Informants, Pharmacy Records  pantoprazole (PROTONIX) 40 MG tablet [id number]  Take 1 tablet (40 mg total) by mouth 2 (two) times daily. Lonia Blood, MD  Active   sucralfate (CARAFATE) 1 g tablet [id number]  Take 1 tablet (1 g total) by mouth 4 (four) times daily -  with meals and at bedtime. Lonia Blood, MD  Active   Vitamin D, Ergocalciferol, (DRISDOL) 1.25 MG (50000 UNIT) CAPS capsule [id number] No Take 1 capsule (50,000 Units total) by mouth every 7 (seven) days.  Patient taking differently: Take 50,000 Units by mouth every Friday.   [name], MD Past Week Active Self, Multiple Informants, Pharmacy Records  Med Note [name], [name] Jan 02, 2024  2:39 PM) LF: 11/04/23 for a 28ds            Home Care and Equipment/Supplies: Were Home Health Services Ordered?: NA Any new equipment or medical supplies ordered?:  NA  Functional Questionnaire: Do you need assistance with bathing/showering or dressing?: Yes Do you need assistance with meal preparation?: Yes Do you need assistance with eating?: No Do you have difficulty maintaining continence: No Do you need assistance with getting out of bed/getting out of a chair/moving?: Yes Do you have difficulty managing or taking your medications?: Yes  Follow up appointments reviewed: PCP Follow-up appointment confirmed?: No (declined appt) MD Provider Line Number:[phone] Given: No Specialist Hospital Follow-up appointment confirmed?: Yes Date of Specialist follow-up appointment?: 01/11/24 Follow-Up Specialty Provider:: GI Do you need transportation to your follow-up appointment?: No Do you understand care options if your condition(s) worsen?: Yes-patient verbalized understanding    SIGNATURE Karena Addison, LPN [facility] Nurse Health Advisor Direct Dial [phone]

## 2024-01-07 NOTE — Progress Notes (Signed)
 I spoke with [name] today and informed her of the biopsy results, which unfortunately, confirmed my suspicion for cancer.  I informed her that the cancer was a type of lymphoma and that she would be referred to an oncologist to guide her treatment, which includes the possibility of radiation therapy. I informed her that she has an H. Pylori infection, which is the most likely cause of her lymphoma, and that this bacteria needs to be eradicated.  She has a listed contraindication to flagyl.  She states she experienced nausea when she took it last.  She denied alcohol use.  She thinks she could tolerate it if she needed to take it.    She had a CT of the abdomen and pelvis during her recent hospital admission.  I informed her that we need to get a CT of the chest to complete staging.  She indicated understanding and will be expecting a call from oncology, radiology and our office to review the H. Pylori treatment in more detail  [name],  Can you please place a referral to oncology for new diagnosis of gastric B-cell lymphoma, order a CT chest w/out contrast for staging and place the following prescriptions:  1) Pepto Bismol 2 tabs (262 mg each) 4 times a day x 14 d 2) Metronidazole 250 mg 4 times a day x 14 d 3) doxycycline 100 mg 2 times a day x 14 d 4) Zofran 4 mg PO every 4 hours as needed for nausea #30 rf0  She has a prescription for Protonix 40 mg PO BID and she should continue this.  Will await recommendations from oncology regarding treatment and need for repeat EGD to assess response.  Please call the patient Monday to go over the H. Pylori treatment and make sure she retained all the information discussed today.

## 2024-01-09 ENCOUNTER — Other Ambulatory Visit: Payer: Self-pay | Admitting: *Deleted

## 2024-01-09 DIAGNOSIS — C8599 Non-Hodgkin lymphoma, unspecified, extranodal and solid organ sites: Secondary | ICD-10-CM

## 2024-01-09 MED ORDER — DOXYCYCLINE HYCLATE 100 MG PO TABS: 100.0000 mg | ORAL_TABLET | Freq: Two times a day (BID) | ORAL | 0 refills | Status: AC

## 2024-01-09 MED ORDER — BISMUTH SUBSALICYLATE 262 MG PO CHEW: 524.0000 mg | CHEWABLE_TABLET | Freq: Four times a day (QID) | ORAL | 0 refills | Status: AC

## 2024-01-09 MED ORDER — METRONIDAZOLE 250 MG PO TABS: 250.0000 mg | ORAL_TABLET | Freq: Four times a day (QID) | ORAL | 0 refills | Status: DC

## 2024-01-09 MED ORDER — ONDANSETRON HCL 4 MG PO TABS
4.0000 mg | ORAL_TABLET | ORAL | 1 refills | Status: AC | PRN
Start: 2024-01-09 — End: ?

## 2024-01-11 ENCOUNTER — Ambulatory Visit (HOSPITAL_BASED_OUTPATIENT_CLINIC_OR_DEPARTMENT_OTHER)
Admission: RE | Admit: 2024-01-11 | Discharge: 2024-01-11 | Discharge status: Home / Self Care | Source: Ambulatory Visit | Attending: Gastroenterology | Admitting: Radiology

## 2024-01-11 DIAGNOSIS — C799 Secondary malignant neoplasm of unspecified site: Secondary | ICD-10-CM | POA: Diagnosis not present

## 2024-01-11 DIAGNOSIS — C8510 Unspecified B-cell lymphoma, unspecified site: Secondary | ICD-10-CM | POA: Diagnosis not present

## 2024-01-11 DIAGNOSIS — C8519 Unspecified B-cell lymphoma, extranodal and solid organ sites: Secondary | ICD-10-CM | POA: Diagnosis not present

## 2024-01-11 DIAGNOSIS — C8599 Non-Hodgkin lymphoma, unspecified, extranodal and solid organ sites: Secondary | ICD-10-CM

## 2024-01-16 ENCOUNTER — Other Ambulatory Visit: Payer: Self-pay | Admitting: Oncology

## 2024-01-16 DIAGNOSIS — C8599 Non-Hodgkin lymphoma, unspecified, extranodal and solid organ sites: Secondary | ICD-10-CM

## 2024-01-16 NOTE — Progress Notes (Signed)
 [facility]  [address] [place],  [place]  [address] [phone]  Clinic Day:  01/17/2024  Referring physician: [name], MD   HISTORY OF PRESENT ILLNESS:  The patient is an 89 y.o. female who I was asked to consult upon for a newly diagnosed gastric lymphoma.  According to the patient, she had been very weak over the past few weeks to where she recently passed out twice.  This led to her being brought to the emergency room for further evaluation.  While there, her hemoglobin was checked, which came back low at 6.1.  This led to her being admitted and receiving 4 units of blood during her hospitalization.  The patient also recalls having black stools.  She also had noticed an increased frequency of bowel movements, especially after eating.  Despite this, she denies having any recent weight loss.  Due to her anemia and black stools, a GI workup was done in the hospital.  Her EGD revealed a malignant ulcer in the stomach, that was biopsy-proven to be high-grade B-cell lymphoma.  The patient comes in today to go over her pathology and its implications.  She recalls having 1 episode of night sweats, but denies having any fevers or unexplained weight loss.  She also denies noticing any lymphadenopathy.  PAST MEDICAL HISTORY:   Past Medical History:  Diagnosis Date   Anemia    Arthritis    Constipation    Diverticulosis    Diverticulosis    DJD (degenerative joint disease)    Gastric ulcer    Hemorrhoids    Hypertension    Inguinal hernia, left    Irritable bowel syndrome    Osteoporosis    Stomach cancer (HCC)    Urethral caruncle    UTI (lower urinary tract infection)    hx of frequent uti's   Vitamin D deficiency     PAST SURGICAL HISTORY:   Past Surgical History:  Procedure Laterality Date   APPENDECTOMY     BIOPSY OF SKIN SUBCUTANEOUS TISSUE AND/OR MUCOUS MEMBRANE  01/03/2024   Procedure: BIOPSY, gastric;  Surgeon: Jenel Lucks, MD;  Location: [facility] ENDOSCOPY;  Service: Gastroenterology;;   BREAST SURGERY Left    biopsy   CERVICAL FUSION     colon polyps     ESOPHAGOGASTRODUODENOSCOPY N/A 01/03/2024   Procedure: EGD (ESOPHAGOGASTRODUODENOSCOPY);  Surgeon: Jenel Lucks, MD;  Location: [facility] ENDOSCOPY;  Service: Gastroenterology;  Laterality: N/A;   FINGER SURGERY     HEMORRHOID SURGERY     HIP SURGERY Right    INGUINAL HERNIA REPAIR Left 01/13/2017   Procedure: LEFT INGUINAL HERNIA REPAIR WITH MESH;  Surgeon: Avel Peace, MD;  Location: [facility];  Service: General;  Laterality: Left;   INSERTION OF MESH Left 01/13/2017   Procedure: INSERTION OF MESH;  Surgeon: Avel Peace, MD;  Location: [facility];  Service: General;  Laterality: Left;   LUMBAR FUSION     LUMBAR LAMINECTOMY     partial resection of colon for tumor     SHOULDER SURGERY Left    TOTAL HIP ARTHROPLASTY     right   TOTAL HIP REVISION Right 11/17/2020   Procedure: ORIF of Right periprosthetic fracture;  Surgeon: Durene Romans, MD;  Location: [facility];  Service: Orthopedics;  Laterality: Right;   CURRENT MEDICATIONS:   Current Outpatient Medications  Medication Sig Dispense Refill   acetaminophen (TYLENOL) 325 MG tablet Take 2 tablets (650 mg total) by  mouth every 6 (six) hours as needed for mild pain (pain score 1-3) (or Fever >/= 101).     Artificial Tear Solution (GENTEAL TEARS) 0.1-0.2-0.3 % SOLN Place 1 drop into both eyes at bedtime as needed (dry eye).     bisoprolol (ZEBETA) 5 MG tablet Take 1 tablet (5 mg total) by mouth daily. 60 tablet 1   bisoprolol-hydrochlorothiazide (ZIAC) 5-6.25 MG tablet TAKE 1 TABLET BY MOUTH EVERY DAY 30 tablet 0   Calcium-Vitamin D-Vitamin K (VIACTIV CALCIUM PLUS D) 650-12.5-40 MG-MCG-MCG CHEW As directed (Patient taking differently: Chew 1 tablet by mouth daily.) 90 tablet 3   feeding supplement (BOOST HIGH PROTEIN) LIQD Take 1 Container by mouth daily.      gabapentin (NEURONTIN) 100 MG capsule Take 1 capsule (100 mg total) by mouth at bedtime. 60 capsule 3   Glucosamine-Chondroitin 250-200 MG TABS Take 1 tablet by mouth daily. 90 tablet 3   Multiple Vitamin (MULTIVITAMIN WITH MINERALS) TABS tablet Take 1 tablet by mouth daily.     Omega-3 Fatty Acids (FISH OIL) 1000 MG CAPS Take 2 capsules (2,000 mg total) by mouth 2 (two) times daily. (Patient taking differently: Take 2,000 mg by mouth daily with breakfast.) 360 capsule 3   ondansetron (ZOFRAN) 4 MG tablet Take 1 tablet (4 mg total) by mouth every 4 (four) hours as needed for nausea or vomiting. 30 tablet 1   pantoprazole (PROTONIX) 40 MG tablet Take 1 tablet (40 mg total) by mouth 2 (two) times daily. 112 tablet 0   sucralfate (CARAFATE) 1 g tablet Take 1 tablet (1 g total) by mouth 4 (four) times daily -  with meals and at bedtime. 48 tablet 0   Vitamin D, Ergocalciferol, (DRISDOL) 1.25 MG (50000 UNIT) CAPS capsule Take 1 capsule (50,000 Units total) by mouth every 7 (seven) days. (Patient taking differently: Take 50,000 Units by mouth every Friday.) 4 capsule 0   No current facility-administered medications for this visit.    ALLERGIES:   Allergies  Allergen Reactions   Codeine Other (See Comments)    Headaches   Flagyl [Metronidazole Hcl] Nausea Only    FAMILY HISTORY:   Family History  Problem Relation Age of Onset   Colon cancer Mother    Cancer Father    Lung cancer Father    Dementia Sister    Breast cancer Sister    Ovarian cancer Sister    Cerebral aneurysm Brother    Other Brother        UNKNOWN PRIMARY - CANCER   Diabetes Brother    Breast cancer Daughter     SOCIAL HISTORY:  The patient was born and raised in [place].  She [place] in [place] her husband of 60 years.  She has 1 child, 2 grandchildren, and 4 great-grandchildren.  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  She also served as a Lawyer.  She also  worked at a Mid[employer] for 24 years.  There is no history of alcoholism or tobacco abuse.  REVIEW OF SYSTEMS:  Review of Systems  Constitutional:  Negative for fatigue and fever.  HENT:   Negative for hearing loss and sore throat.   Eyes:  Negative for eye problems.  Respiratory:  Negative for chest tightness, cough and hemoptysis.   Cardiovascular:  Negative for chest pain and palpitations.  Gastrointestinal:  Positive for blood in stool and diarrhea. Negative for abdominal distention, abdominal pain, constipation, nausea and vomiting.  Endocrine: Negative for hot  flashes.  Genitourinary:  Negative for difficulty urinating, dysuria, frequency, hematuria and nocturia.   Musculoskeletal:  Positive for arthralgias. Negative for back pain, gait problem and myalgias.  Skin: Negative.  Negative for itching and rash.  Neurological: Negative.  Negative for dizziness, extremity weakness, gait problem, headaches, light-headedness and numbness.  Hematological: Negative.   Psychiatric/Behavioral: Negative.  Negative for depression and suicidal ideas. The patient is not nervous/anxious.    PHYSICAL EXAM:  Blood pressure (!) 149/87, pulse 74, temperature (!) 97.4 F (36.3 C), temperature source Oral, resp. rate 14, height 5' 5.5" (1.664 m), weight 117 lb 12.8 oz (53.4 kg), SpO2 100%. Wt Readings from Last 3 Encounters:  01/17/24 117 lb 12.8 oz (53.4 kg)  01/03/24 127 lb 6.8 oz (57.8 kg)  12/30/23 127 lb 6.8 oz (57.8 kg)   Body mass index is 19.3 kg/m. Performance status (ECOG): 2 - Symptomatic, <50% confined to bed Physical Exam Constitutional:      Appearance: Normal appearance. She is not ill-appearing.  HENT:     Mouth/Throat:     Mouth: Mucous membranes are moist.     Pharynx: Oropharynx is clear. No oropharyngeal exudate or posterior oropharyngeal erythema.  Cardiovascular:     Rate and Rhythm: Normal rate and regular rhythm.     Heart sounds: No murmur heard.    No friction rub. No  gallop.  Pulmonary:     Effort: Pulmonary effort is normal. No respiratory distress.     Breath sounds: Normal breath sounds. No wheezing, rhonchi or rales.  Abdominal:     General: Bowel sounds are normal. There is no distension.     Palpations: Abdomen is soft. There is no mass.     Tenderness: There is no abdominal tenderness.  Musculoskeletal:        General: No swelling.     Right lower leg: No edema.     Left lower leg: No edema.  Lymphadenopathy:     Cervical: No cervical adenopathy.     Upper Body:     Right upper body: No supraclavicular or axillary adenopathy.     Left upper body: No supraclavicular or axillary adenopathy.     Lower Body: No right inguinal adenopathy. No left inguinal adenopathy.  Skin:    General: Skin is warm.     Coloration: Skin is not jaundiced.     Findings: No lesion or rash.  Neurological:     General: No focal deficit present.     Mental Status: She is alert and oriented to person, place, and time. Mental status is at baseline.  Psychiatric:        Mood and Affect: Mood normal.        Behavior: Behavior normal.        Thought Content: Thought content normal.    PATHOLOGY:  A biopsy of one of her gastric ulcers revealed the following: FINAL MICROSCOPIC DIAGNOSIS:  A. STOMACH, ANTRUM, BIOPSY: - H. pylori gastritis - Positive for H. pylori on immunohistochemical stain - Negative for intestinal metaplasia, dysplasia or malignancy  B. STOMACH, ULCERATION, BIOPSY: - High-grade B-cell lymphoma.  See comment.  COMMENT:  Part B: Morphologic evaluation reveals gastric mucosa with a dense submucosal lymphoid infiltrate comprised of medium to large sized cells with areas of necrosis.  Immunohistochemical stains reveal the tumor cells are positive for CD20, PAX5, and weak Bcl-2 staining.  CD3 and CD5 highlight background T lymphocytes.  The Ki-67 proliferation index is elevated approximately 90%.  CD10 and BCL6 appear  to be positive in  the neoplastic cells.  EBER-ISH, CD30 and cyclin D1 are negative.  MUM1-1 shows equivocal staining.  The findings are consistent with a large B-cell lymphoma, germinal center origin, with a high Ki-67 proliferation index.  Given the weak Bcl-2 staining, a (less likely) possibility is Burkitt lymphoma.  High-grade B-cell lymphoma panel will be ordered for further assessment and to rule out double/triple hit lymphoma, and reported in an addendum.   LABS:      Latest Ref Rng & Units 01/17/2024    2:34 PM 01/05/2024    3:46 AM 01/04/2024    2:56 PM  CBC  WBC 4.0 - 10.5 K/uL 7.4  8.2  9.2   Hemoglobin 12.0 - 15.0 g/dL 40.9  9.4  9.8   Hematocrit 36.0 - 46.0 % 34.2  27.4  29.4   Platelets 150 - 400 K/uL 230  163  165       Latest Ref Rng & Units 01/17/2024    2:34 PM 01/05/2024    3:46 AM 01/04/2024    3:45 AM  CMP  Glucose 70 - 99 mg/dL 811  99  99   BUN 8 - 23 mg/dL 21  15  31    Creatinine 0.44 - 1.00 mg/dL 9.14  7.82  9.56   Sodium 135 - 145 mmol/L 138  138  136   Potassium 3.5 - 5.1 mmol/L 4.0  3.3  3.5   Chloride 98 - 111 mmol/L 102  109  110   CO2 22 - 32 mmol/L 23  22  21    Calcium 8.9 - 10.3 mg/dL 8.9  7.2  7.4   Total Protein 6.5 - 8.1 g/dL 6.1     Total Bilirubin 0.0 - 1.2 mg/dL 0.3     Alkaline Phos 38 - 126 U/L 44     AST 15 - 41 U/L 30     ALT 0 - 44 U/L 19       Latest Reference Range & Units 01/17/24 14:34  LDH 98 - 192 U/L 296 (H)  (H): Data is abnormally high STUDIES:  CT CHEST WO CONTRAST Result Date: 01/23/2024 CLINICAL DATA:  New diagnosis of gastric B-cell lymphoma. Evaluate for metastatic disease. * Tracking Code: BO * EXAM: CT CHEST WITHOUT CONTRAST TECHNIQUE: Multidetector CT imaging of the chest was performed following the standard protocol without IV contrast. RADIATION DOSE REDUCTION: This exam was performed according to the departmental dose-optimization program which includes automated exposure control, adjustment of the mA and/or kV according to  patient size and/or use of iterative reconstruction technique. COMPARISON:  CT abdomen pelvis 01/02/2024. FINDINGS: Cardiovascular: Atherosclerotic calcification of the aorta and coronary arteries. Enlarged right and left pulmonary arteries. Heart is at the upper limits of normal in size to mildly enlarged. No pericardial effusion. Mediastinum/Nodes: No pathologically enlarged mediastinal or axillary lymph nodes. Hilar regions are difficult to definitively evaluate without IV contrast. Lungs/Pleura: Subsegmental volume loss in both lower lobes. No suspicious pulmonary nodules. Mild cylindrical bronchiectasis. No pleural fluid. Airway is unremarkable. Upper Abdomen: Subcentimeter low-attenuation lesions in the right hepatic lobe, too small to characterize. Masslike gastric wall thickening, better seen on 01/02/2024. There may be ill-defined gastrohepatic ligament adenopathy. Visualized portions of the liver, adrenal glands, kidneys, spleen, pancreas, stomach and bowel are otherwise grossly unremarkable. Musculoskeletal: Degenerative changes in the spine. Old right rib fractures. IMPRESSION: 1. No evidence of metastatic disease. Masslike gastric wall thickening and possible gastrohepatic ligament adenopathy, better seen on 01/02/2024. 2. Mild cylindrical  bronchiectasis. 3. Aortic atherosclerosis (ICD10-I70.0). Coronary artery calcification. 4. Enlarged right and left pulmonary arteries, indicative of pulmonary arterial hypertension. Electronically Signed   By: Leanna Battles M.D.   On: 01/23/2024 16:23   CT ANGIO GI BLEED Result Date: 01/02/2024 CLINICAL DATA:  Lower GI bleeding EXAM: CTA ABDOMEN AND PELVIS WITHOUT AND WITH CONTRAST TECHNIQUE: Multidetector CT imaging of the abdomen and pelvis was performed using the standard protocol during bolus administration of intravenous contrast. Multiplanar reconstructed images and MIPs were obtained and reviewed to evaluate the vascular anatomy. RADIATION DOSE REDUCTION:  This exam was performed according to the departmental dose-optimization program which includes automated exposure control, adjustment of the mA and/or kV according to patient size and/or use of iterative reconstruction technique. CONTRAST:  75mL OMNIPAQUE IOHEXOL 350 MG/ML SOLN COMPARISON:  CT 11/16/2020 FINDINGS: VASCULAR Aorta: Normal caliber aorta without aneurysm, dissection, vasculitis or significant stenosis. Moderate atherosclerosis Celiac: At least moderate focal stenosis at the origin of the celiac trunk. Distal patency. No aneurysm, dissection or occlusion SMA: Patent without evidence of aneurysm, dissection, vasculitis or significant stenosis. Renals: Calcification at the origin of right renal artery without significant stenosis. Both dominant renal arteries are patent and without significant aneurysm dissection or stenosis. There are tiny lower pole accessory arteries bilaterally IMA: Diminutive but patent Inflow: Patent without evidence of aneurysm, dissection, vasculitis or significant stenosis. Proximal Outflow: Bilateral common femoral and visualized portions of the superficial and profunda femoral arteries are patent without evidence of aneurysm, dissection, vasculitis or significant stenosis. Veins: Portal and splenic veins are patent. Review of the MIP images confirms the above findings. NON-VASCULAR Lower chest: Lung bases demonstrate mild scarring at the bases. No acute airspace disease Hepatobiliary: Subcentimeter hypodensity in the right hepatic lobe too small to further characterize but unchanged and therefore likely benign. No gallstones, gallbladder wall thickening, or biliary dilatation. Pancreas: Unremarkable. No pancreatic ductal dilatation or surrounding inflammatory changes. Spleen: Normal in size without focal abnormality. Adrenals/Urinary Tract: Adrenal glands are normal. Kidneys show no hydronephrosis. The bladder is unremarkable Stomach/Bowel: Irregular masslike thickening at the  gastric cardia and lesser curvature of stomach, series 14, image 17 through 34 and coronal series 17 image 90. No dilated small bowel. Diverticular disease of the colon. Possible minimal fat stranding and wall thickening at the splenic flexure/proximal descending colon as may be seen with mild diverticulitis. No intraluminal extravasation to suggest active GI bleeding on this exam. Lymphatic: No suspicious lymph nodes Reproductive: Uterus is atrophic.  2.2 cm left adnexal cyst. Other: Negative for pelvic effusion or free air Musculoskeletal: Right hip replacement. Multilevel degenerative changes. Mild chronic superior endplate deformity at T12. question mild posterior cortical erosive change at the L2 vertebral body, sagittal series 18, image 108. IMPRESSION: 1. Negative for active GI bleeding. Diverticular disease of the colon with possible minimal wall thickening and fat stranding at the splenic flexure/proximal descending colon as may be seen with an acute diverticulitis. 2. Irregular masslike thickening at the gastric cardia and lesser curvature of stomach, concern raised for gastric malignancy. Correlation with endoscopy is recommended 3. Moderate atherosclerosis of the aorta with at least moderate stenosis at the origin of the celiac trunk. Negative for dissection or occlusive disease 4. Question mild posterior cortical erosive change at the L2 vertebral body. This could be further assessed with MRI 5. Aortic atherosclerosis. Electronically Signed   By: Jasmine Pang M.D.   On: 01/02/2024 16:45   CT Head Wo Contrast Result Date: 01/02/2024 CLINICAL DATA:  Head trauma, minor (  Age >= 65y). EXAM: CT HEAD WITHOUT CONTRAST TECHNIQUE: Contiguous axial images were obtained from the base of the skull through the vertex without intravenous contrast. RADIATION DOSE REDUCTION: This exam was performed according to the departmental dose-optimization program which includes automated exposure control, adjustment of the mA  and/or kV according to patient size and/or use of iterative reconstruction technique. COMPARISON:  None Available. FINDINGS: Brain: There is no evidence of an acute infarct, intracranial hemorrhage, mass, midline shift, or extra-axial fluid collection. Mild cerebral atrophy is within normal limits for age. Cerebral white matter hypodensities are nonspecific but compatible with mild chronic small vessel ischemic disease. Vascular: No hyperdense vessel or unexpected calcification. Skull: No acute fracture or suspicious lesion. Sinuses/Orbits: Visualized paranasal sinuses and mastoid air cells are clear. Unremarkable included orbits. Other: None. IMPRESSION: 1. No evidence of acute intracranial abnormality. 2. Mild chronic small vessel ischemic disease. Electronically Signed   By: Sebastian Ache M.D.   On: 01/02/2024 15:56     ASSESSMENT & PLAN:  An 89 y.o. female who I was asked to consult upon for having a high-grade gastric lymphoma.  Currently, the patient's tumor is undergoing additional testing to determine the exact type of high-grade lymphoma she has.  Furthermore, I would like for this patient undergo a PET scan to see if she has other areas of lymphoma outside of her stomach.  I will arrange for her to have a PET scan next week.  I will see her back at that time to go over both her PET scan images, as well as additional pathologic studies which can better help delineate this type of high-grade lymphoma she has.  All these results be used to formulate a treatment plan for her gastric lymphoma management.  The patient understands all the plans discussed today and is in agreement with them.  I do appreciate Burchette, Elberta Fortis, MD for his new consult.   Maycen Degregory Kirby Funk, MD

## 2024-01-17 ENCOUNTER — Encounter: Payer: Self-pay | Admitting: Oncology

## 2024-01-17 ENCOUNTER — Inpatient Hospital Stay

## 2024-01-17 ENCOUNTER — Other Ambulatory Visit: Payer: Self-pay | Admitting: Oncology

## 2024-01-17 ENCOUNTER — Inpatient Hospital Stay: Attending: Oncology | Admitting: Oncology

## 2024-01-17 VITALS — BP 149/87 | HR 74 | Temp 97.4°F | Resp 14 | Ht 65.5 in | Wt 117.8 lb

## 2024-01-17 DIAGNOSIS — C8599 Non-Hodgkin lymphoma, unspecified, extranodal and solid organ sites: Secondary | ICD-10-CM

## 2024-01-17 DIAGNOSIS — A048 Other specified bacterial intestinal infections: Secondary | ICD-10-CM | POA: Diagnosis not present

## 2024-01-17 DIAGNOSIS — C8519 Unspecified B-cell lymphoma, extranodal and solid organ sites: Secondary | ICD-10-CM | POA: Diagnosis not present

## 2024-01-17 LAB — CMP (CANCER CENTER ONLY)
ALT: 19 U/L (ref 0–44)
AST: 30 U/L (ref 15–41)
Albumin: 3.9 g/dL (ref 3.5–5.0)
Alkaline Phosphatase: 44 U/L (ref 38–126)
Anion gap: 12 (ref 5–15)
BUN: 21 mg/dL (ref 8–23)
CO2: 23 mmol/L (ref 22–32)
Calcium: 8.9 mg/dL (ref 8.9–10.3)
Chloride: 102 mmol/L (ref 98–111)
Creatinine: 0.78 mg/dL (ref 0.44–1.00)
GFR, Estimated: >60 mL/min (ref >60)
Glucose, Bld: 114 mg/dL — ABNORMAL HIGH (ref 70–99)
Potassium: 4 mmol/L (ref 3.5–5.1)
Sodium: 138 mmol/L (ref 135–145)
Total Bilirubin: 0.3 mg/dL (ref 0.0–1.2)
Total Protein: 6.1 g/dL — ABNORMAL LOW (ref 6.5–8.1)

## 2024-01-17 LAB — CBC WITH DIFFERENTIAL (CANCER CENTER ONLY)
Abs Immature Granulocytes: 0.03 10*3/uL (ref 0.00–0.07)
Basophils Absolute: 0.1 10*3/uL (ref 0.0–0.1)
Basophils Relative: 1 %
Eosinophils Absolute: 0.2 10*3/uL (ref 0.0–0.5)
Eosinophils Relative: 2 %
HCT: 34.2 % — ABNORMAL LOW (ref 36.0–46.0)
Hemoglobin: 11 g/dL — ABNORMAL LOW (ref 12.0–15.0)
Immature Granulocytes: 0 %
Lymphocytes Relative: 26 %
Lymphs Abs: 2 10*3/uL (ref 0.7–4.0)
MCH: 30.9 pg (ref 26.0–34.0)
MCHC: 32.2 g/dL (ref 30.0–36.0)
MCV: 96.1 fL (ref 80.0–100.0)
Monocytes Absolute: 0.5 10*3/uL (ref 0.1–1.0)
Monocytes Relative: 7 %
Neutro Abs: 4.6 10*3/uL (ref 1.7–7.7)
Neutrophils Relative %: 64 %
Platelet Count: 230 10*3/uL (ref 150–400)
RBC: 3.56 MIL/uL — ABNORMAL LOW (ref 3.87–5.11)
RDW: 15.6 % — ABNORMAL HIGH (ref 11.5–15.5)
WBC Count: 7.4 10*3/uL (ref 4.0–10.5)
nRBC: 0 % (ref 0.0–0.2)
nRBC: 0 /100{WBCs}

## 2024-01-17 LAB — LACTATE DEHYDROGENASE: LDH: 296 U/L — ABNORMAL HIGH (ref 98–192)

## 2024-01-18 ENCOUNTER — Telehealth: Payer: Self-pay | Admitting: Oncology

## 2024-01-18 NOTE — Telephone Encounter (Signed)
 01/18/24 Spoke with patient and scheduled PET SCAN on 01/25/24 arrive at 730am-RH

## 2024-01-22 ENCOUNTER — Other Ambulatory Visit: Payer: Self-pay | Admitting: Family Medicine

## 2024-01-23 NOTE — Telephone Encounter (Signed)
 Rx denial sent as last Rx sent in error.

## 2024-01-23 NOTE — Addendum Note (Signed)
 Addended by: Johnella Moloney on: 01/23/2024 09:40 AM   Modules accepted: Orders

## 2024-01-24 ENCOUNTER — Encounter (HOSPITAL_COMMUNITY): Payer: Self-pay | Admitting: Student in an Organized Health Care Education/Training Program

## 2024-01-25 DIAGNOSIS — C8599 Non-Hodgkin lymphoma, unspecified, extranodal and solid organ sites: Secondary | ICD-10-CM | POA: Diagnosis not present

## 2024-01-25 LAB — SURGICAL PATHOLOGY

## 2024-01-26 DIAGNOSIS — C8599 Non-Hodgkin lymphoma, unspecified, extranodal and solid organ sites: Secondary | ICD-10-CM | POA: Insufficient documentation

## 2024-01-26 NOTE — Progress Notes (Deleted)
 [facility]  [address] [place],  [place]  [address] [phone]  Clinic Day:  01/26/2024  Referring physician: [name], MD   HISTORY OF PRESENT ILLNESS:  The patient is a 89 y.o. female  *** who I was asked to consult upon for ***   PAST MEDICAL HISTORY:   Past Medical History:  Diagnosis Date   Anemia    Arthritis    Constipation    Diverticulosis    Diverticulosis    DJD (degenerative joint disease)    Gastric ulcer    Hemorrhoids    Hypertension    Inguinal hernia, left    Irritable bowel syndrome    Osteoporosis    Stomach cancer (HCC)    Urethral caruncle    UTI (lower urinary tract infection)    hx of frequent uti's   Vitamin D deficiency     PAST SURGICAL HISTORY:   Past Surgical History:  Procedure Laterality Date   APPENDECTOMY     BIOPSY OF SKIN SUBCUTANEOUS TISSUE AND/OR MUCOUS MEMBRANE  01/03/2024   Procedure: BIOPSY, gastric;  Surgeon: Jenel Lucks, MD;  Location: [facility] ENDOSCOPY;  Service: Gastroenterology;;   BREAST SURGERY Left    biopsy   CERVICAL FUSION     colon polyps     ESOPHAGOGASTRODUODENOSCOPY N/A 01/03/2024   Procedure: EGD (ESOPHAGOGASTRODUODENOSCOPY);  Surgeon: Jenel Lucks, MD;  Location: [facility] ENDOSCOPY;  Service: Gastroenterology;  Laterality: N/A;   FINGER SURGERY     HEMORRHOID SURGERY     HIP SURGERY Right    INGUINAL HERNIA REPAIR Left 01/13/2017   Procedure: LEFT INGUINAL HERNIA REPAIR WITH MESH;  Surgeon: Avel Peace, MD;  Location: [facility];  Service: General;  Laterality: Left;   INSERTION OF MESH Left 01/13/2017   Procedure: INSERTION OF MESH;  Surgeon: Avel Peace, MD;  Location: [facility];  Service: General;  Laterality: Left;   LUMBAR FUSION     LUMBAR LAMINECTOMY     partial resection of colon for tumor     SHOULDER SURGERY Left    TOTAL HIP ARTHROPLASTY     right   TOTAL HIP REVISION Right 11/17/2020   Procedure:  ORIF of Right periprosthetic fracture;  Surgeon: Durene Romans, MD;  Location: [facility];  Service: Orthopedics;  Laterality: Right;    CURRENT MEDICATIONS:   Current Outpatient Medications  Medication Sig Dispense Refill   acetaminophen (TYLENOL) 325 MG tablet Take 2 tablets (650 mg total) by mouth every 6 (six) hours as needed for mild pain (pain score 1-3) (or Fever >/= 101).     Artificial Tear Solution (GENTEAL TEARS) 0.1-0.2-0.3 % SOLN Place 1 drop into both eyes at bedtime as needed (dry eye).     bisoprolol (ZEBETA) 5 MG tablet Take 1 tablet (5 mg total) by mouth daily. 60 tablet 1   bisoprolol-hydrochlorothiazide (ZIAC) 5-6.25 MG tablet TAKE 1 TABLET BY MOUTH EVERY DAY 30 tablet 0   Calcium-Vitamin D-Vitamin K (VIACTIV CALCIUM PLUS D) 650-12.5-40 MG-MCG-MCG CHEW As directed (Patient taking differently: Chew 1 tablet by mouth daily.) 90 tablet 3   feeding supplement (BOOST HIGH PROTEIN) LIQD Take 1 Container by mouth daily.     gabapentin (NEURONTIN) 100 MG capsule Take 1 capsule (100 mg total) by mouth at bedtime. 60 capsule 3   Glucosamine-Chondroitin 250-200 MG TABS Take 1 tablet by mouth daily. 90 tablet 3   Multiple Vitamin (MULTIVITAMIN WITH MINERALS) TABS tablet Take 1 tablet by mouth  daily.     Omega-3 Fatty Acids (FISH OIL) 1000 MG CAPS Take 2 capsules (2,000 mg total) by mouth 2 (two) times daily. (Patient taking differently: Take 2,000 mg by mouth daily with breakfast.) 360 capsule 3   ondansetron (ZOFRAN) 4 MG tablet Take 1 tablet (4 mg total) by mouth every 4 (four) hours as needed for nausea or vomiting. 30 tablet 1   pantoprazole (PROTONIX) 40 MG tablet Take 1 tablet (40 mg total) by mouth 2 (two) times daily. 112 tablet 0   sucralfate (CARAFATE) 1 g tablet Take 1 tablet (1 g total) by mouth 4 (four) times daily -  with meals and at bedtime. 48 tablet 0   Vitamin D, Ergocalciferol, (DRISDOL) 1.25 MG (50000 UNIT) CAPS capsule Take 1 capsule (50,000 Units total) by mouth every 7  (seven) days. (Patient taking differently: Take 50,000 Units by mouth every Friday.) 4 capsule 0   No current facility-administered medications for this visit.    ALLERGIES:   Allergies  Allergen Reactions   Codeine Other (See Comments)    Headaches   Flagyl [Metronidazole Hcl] Nausea Only    FAMILY HISTORY:   Family History  Problem Relation Age of Onset   Colon cancer Mother    Cancer Father    Lung cancer Father    Dementia Sister    Breast cancer Sister    Ovarian cancer Sister    Cerebral aneurysm Brother    Other Brother        UNKNOWN PRIMARY - CANCER   Diabetes Brother    Breast cancer Daughter     SOCIAL HISTORY:   reports that she has never smoked. She has never used smokeless tobacco. She reports that she does not drink alcohol and does not use drugs.  REVIEW OF SYSTEMS:  Review of Systems - Oncology   PHYSICAL EXAM:  There were no vitals taken for this visit. Wt Readings from Last 3 Encounters:  01/17/24 117 lb 12.8 oz (53.4 kg)  01/03/24 127 lb 6.8 oz (57.8 kg)  12/30/23 127 lb 6.8 oz (57.8 kg)   There is no height or weight on file to calculate BMI. Performance status (ECOG): {CHL ONC [id number] Physical Exam  PATHOLOGY:  A biopsy of one of her gastric ulcers revealed the following: FINAL MICROSCOPIC DIAGNOSIS:  A. STOMACH, ANTRUM, BIOPSY: - H. pylori gastritis - Positive for H. pylori on immunohistochemical stain - Negative for intestinal metaplasia, dysplasia or malignancy  B. STOMACH, ULCERATION, BIOPSY: - High-grade B-cell lymphoma.  See comment.  COMMENT:  Part B: Morphologic evaluation reveals gastric mucosa with a dense submucosal lymphoid infiltrate comprised of medium to large sized cells with areas of necrosis.  Immunohistochemical stains reveal the tumor cells are positive for CD20, PAX5, and weak Bcl-2 staining.  CD3 and CD5 highlight background T lymphocytes.  The Ki-67 proliferation index is elevated approximately 90%.   CD10 and BCL6 appear to be positive in the neoplastic cells.  EBER-ISH, CD30 and cyclin D1 are negative.  MUM1-1 shows equivocal staining.  The findings are consistent with a large B-cell lymphoma, germinal center origin, with a high Ki-67 proliferation index.  Given the weak Bcl-2 staining, a (less likely) possibility is Burkitt lymphoma.  High-grade B-cell lymphoma panel will be ordered for further assessment and to rule out double/triple hit lymphoma, and reported in an addendum.    LABS:      Latest Ref Rng & Units 01/17/2024    2:34 PM 01/05/2024    3:46 AM  01/04/2024    2:56 PM  CBC  WBC 4.0 - 10.5 K/uL 7.4  8.2  9.2   Hemoglobin 12.0 - 15.0 g/dL 04.5  9.4  9.8   Hematocrit 36.0 - 46.0 % 34.2  27.4  29.4   Platelets 150 - 400 K/uL 230  163  165       Latest Ref Rng & Units 01/17/2024    2:34 PM 01/05/2024    3:46 AM 01/04/2024    3:45 AM  CMP  Glucose 70 - 99 mg/dL 409  99  99   BUN 8 - 23 mg/dL 21  15  31    Creatinine 0.44 - 1.00 mg/dL 8.11  9.14  7.82   Sodium 135 - 145 mmol/L 138  138  136   Potassium 3.5 - 5.1 mmol/L 4.0  3.3  3.5   Chloride 98 - 111 mmol/L 102  109  110   CO2 22 - 32 mmol/L 23  22  21    Calcium 8.9 - 10.3 mg/dL 8.9  7.2  7.4   Total Protein 6.5 - 8.1 g/dL 6.1     Total Bilirubin 0.0 - 1.2 mg/dL 0.3     Alkaline Phos 38 - 126 U/L 44     AST 15 - 41 U/L 30     ALT 0 - 44 U/L 19        Lab Results  Component Value Date   CEA 1.6 06/12/2010   /  CEA  Date Value Ref Range Status  06/12/2010 1.6 0.0 - 5.0 ng/mL Final    Comment:    See lab report for associated comment(s)   No results found for: "PSA1" No results found for: "CAN199" No results found for: "CAN125"  Lab Results  Component Value Date   ALBUMINELP 4.1 02/16/2021   A1GS 0.3 02/16/2021   A2GS 0.7 02/16/2021   BETS 0.4 02/16/2021   BETA2SER 0.4 02/16/2021   GAMS 1.1 02/16/2021   SPEI  02/16/2021     Comment:     Normal Serum Protein Electrophoresis Pattern. No abnormal  protein bands (M-protein) detected.    Lab Results  Component Value Date   TIBC 239 (L) 01/03/2024   TIBC 260 11/15/2020   FERRITIN 112 01/03/2024   FERRITIN 52 11/15/2020   FERRITIN 70.6 06/16/2010   IRONPCTSAT 26 01/03/2024   IRONPCTSAT 21 11/15/2020   IRONPCTSAT 32.7 06/16/2010   Lab Results  Component Value Date   LDH 296 (H) 01/17/2024       Component Value Date/Time   ALBUMINELP 4.1 02/16/2021 0834   A1GS 0.3 02/16/2021 0834   A2GS 0.7 02/16/2021 0834   BETS 0.4 02/16/2021 0834   BETA2SER 0.4 02/16/2021 0834   GAMS 1.1 02/16/2021 0834   SPEI  02/16/2021 0834     Comment:     Normal Serum Protein Electrophoresis Pattern. No abnormal protein bands (M-protein) detected.    LDH 296 (H) 01/17/2024 1434   CEA 1.6 06/12/2010 1446    Review Flowsheet  More data exists      Latest Ref Rng & Units 02/16/2021 01/03/2024 01/17/2024  Oncology Labs  Ferritin 11 - 307 ng/mL - 112  -  %SAT 10.4 - 31.8 % - 26  -  Albumin ELP 3.8 - 4.8 g/dL 4.1  - -  Alpha-1 Globulin 0.2 - 0.3 g/dL 0.3  - -  Alpha-2 Globulin 0.5 - 0.9 g/dL 0.7  - -  Beta Globulin 0.4 - 0.6 g/dL 0.4  - -  Beta 2 0.2 - 0.5 g/dL 0.4  - -  Gamma Globulin 0.8 - 1.7 g/dL 1.1  - -  SPE Interp. - --  - -  LDH 98 - 192 U/L - - 296     STUDIES:  CT CHEST WO CONTRAST Result Date: 01/23/2024 CLINICAL DATA:  New diagnosis of gastric B-cell lymphoma. Evaluate for metastatic disease. * Tracking Code: BO * EXAM: CT CHEST WITHOUT CONTRAST TECHNIQUE: Multidetector CT imaging of the chest was performed following the standard protocol without IV contrast. RADIATION DOSE REDUCTION: This exam was performed according to the departmental dose-optimization program which includes automated exposure control, adjustment of the mA and/or kV according to patient size and/or use of iterative reconstruction technique. COMPARISON:  CT abdomen pelvis 01/02/2024. FINDINGS: Cardiovascular: Atherosclerotic calcification of the aorta and coronary  arteries. Enlarged right and left pulmonary arteries. Heart is at the upper limits of normal in size to mildly enlarged. No pericardial effusion. Mediastinum/Nodes: No pathologically enlarged mediastinal or axillary lymph nodes. Hilar regions are difficult to definitively evaluate without IV contrast. Lungs/Pleura: Subsegmental volume loss in both lower lobes. No suspicious pulmonary nodules. Mild cylindrical bronchiectasis. No pleural fluid. Airway is unremarkable. Upper Abdomen: Subcentimeter low-attenuation lesions in the right hepatic lobe, too small to characterize. Masslike gastric wall thickening, better seen on 01/02/2024. There may be ill-defined gastrohepatic ligament adenopathy. Visualized portions of the liver, adrenal glands, kidneys, spleen, pancreas, stomach and bowel are otherwise grossly unremarkable. Musculoskeletal: Degenerative changes in the spine. Old right rib fractures. IMPRESSION: 1. No evidence of metastatic disease. Masslike gastric wall thickening and possible gastrohepatic ligament adenopathy, better seen on 01/02/2024. 2. Mild cylindrical bronchiectasis. 3. Aortic atherosclerosis (ICD10-I70.0). Coronary artery calcification. 4. Enlarged right and left pulmonary arteries, indicative of pulmonary arterial hypertension. Electronically Signed   By: Leanna Battles M.D.   On: 01/23/2024 16:23   CT ANGIO GI BLEED Result Date: 01/02/2024 CLINICAL DATA:  Lower GI bleeding EXAM: CTA ABDOMEN AND PELVIS WITHOUT AND WITH CONTRAST TECHNIQUE: Multidetector CT imaging of the abdomen and pelvis was performed using the standard protocol during bolus administration of intravenous contrast. Multiplanar reconstructed images and MIPs were obtained and reviewed to evaluate the vascular anatomy. RADIATION DOSE REDUCTION: This exam was performed according to the departmental dose-optimization program which includes automated exposure control, adjustment of the mA and/or kV according to patient size and/or  use of iterative reconstruction technique. CONTRAST:  75mL OMNIPAQUE IOHEXOL 350 MG/ML SOLN COMPARISON:  CT 11/16/2020 FINDINGS: VASCULAR Aorta: Normal caliber aorta without aneurysm, dissection, vasculitis or significant stenosis. Moderate atherosclerosis Celiac: At least moderate focal stenosis at the origin of the celiac trunk. Distal patency. No aneurysm, dissection or occlusion SMA: Patent without evidence of aneurysm, dissection, vasculitis or significant stenosis. Renals: Calcification at the origin of right renal artery without significant stenosis. Both dominant renal arteries are patent and without significant aneurysm dissection or stenosis. There are tiny lower pole accessory arteries bilaterally IMA: Diminutive but patent Inflow: Patent without evidence of aneurysm, dissection, vasculitis or significant stenosis. Proximal Outflow: Bilateral common femoral and visualized portions of the superficial and profunda femoral arteries are patent without evidence of aneurysm, dissection, vasculitis or significant stenosis. Veins: Portal and splenic veins are patent. Review of the MIP images confirms the above findings. NON-VASCULAR Lower chest: Lung bases demonstrate mild scarring at the bases. No acute airspace disease Hepatobiliary: Subcentimeter hypodensity in the right hepatic lobe too small to further characterize but unchanged and therefore likely benign. No gallstones, gallbladder wall thickening, or biliary dilatation.  Pancreas: Unremarkable. No pancreatic ductal dilatation or surrounding inflammatory changes. Spleen: Normal in size without focal abnormality. Adrenals/Urinary Tract: Adrenal glands are normal. Kidneys show no hydronephrosis. The bladder is unremarkable Stomach/Bowel: Irregular masslike thickening at the gastric cardia and lesser curvature of stomach, series 14, image 17 through 34 and coronal series 17 image 90. No dilated small bowel. Diverticular disease of the colon. Possible minimal  fat stranding and wall thickening at the splenic flexure/proximal descending colon as may be seen with mild diverticulitis. No intraluminal extravasation to suggest active GI bleeding on this exam. Lymphatic: No suspicious lymph nodes Reproductive: Uterus is atrophic.  2.2 cm left adnexal cyst. Other: Negative for pelvic effusion or free air Musculoskeletal: Right hip replacement. Multilevel degenerative changes. Mild chronic superior endplate deformity at T12. question mild posterior cortical erosive change at the L2 vertebral body, sagittal series 18, image 108. IMPRESSION: 1. Negative for active GI bleeding. Diverticular disease of the colon with possible minimal wall thickening and fat stranding at the splenic flexure/proximal descending colon as may be seen with an acute diverticulitis. 2. Irregular masslike thickening at the gastric cardia and lesser curvature of stomach, concern raised for gastric malignancy. Correlation with endoscopy is recommended 3. Moderate atherosclerosis of the aorta with at least moderate stenosis at the origin of the celiac trunk. Negative for dissection or occlusive disease 4. Question mild posterior cortical erosive change at the L2 vertebral body. This could be further assessed with MRI 5. Aortic atherosclerosis. Electronically Signed   By: Jasmine Pang M.D.   On: 01/02/2024 16:45   CT Head Wo Contrast Result Date: 01/02/2024 CLINICAL DATA:  Head trauma, minor (Age >= 65y). EXAM: CT HEAD WITHOUT CONTRAST TECHNIQUE: Contiguous axial images were obtained from the base of the skull through the vertex without intravenous contrast. RADIATION DOSE REDUCTION: This exam was performed according to the departmental dose-optimization program which includes automated exposure control, adjustment of the mA and/or kV according to patient size and/or use of iterative reconstruction technique. COMPARISON:  None Available. FINDINGS: Brain: There is no evidence of an acute infarct, intracranial  hemorrhage, mass, midline shift, or extra-axial fluid collection. Mild cerebral atrophy is within normal limits for age. Cerebral white matter hypodensities are nonspecific but compatible with mild chronic small vessel ischemic disease. Vascular: No hyperdense vessel or unexpected calcification. Skull: No acute fracture or suspicious lesion. Sinuses/Orbits: Visualized paranasal sinuses and mastoid air cells are clear. Unremarkable included orbits. Other: None. IMPRESSION: 1. No evidence of acute intracranial abnormality. 2. Mild chronic small vessel ischemic disease. Electronically Signed   By: Sebastian Ache M.D.   On: 01/02/2024 15:56     ASSESSMENT & PLAN:  A 89 y.o. female who I was asked to consult upon for *** .The patient understands all the plans discussed today and is in agreement with them.  I do appreciate Burchette, Elberta Fortis, MD for his new consult.   Beyza Bellino Kirby Funk, MD

## 2024-01-27 ENCOUNTER — Other Ambulatory Visit: Payer: Self-pay | Admitting: Oncology

## 2024-01-27 ENCOUNTER — Inpatient Hospital Stay: Attending: Oncology | Admitting: Oncology

## 2024-01-27 VITALS — BP 142/84 | HR 77 | Temp 97.9°F | Resp 16 | Ht 65.5 in | Wt 118.0 lb

## 2024-01-27 DIAGNOSIS — C8599 Non-Hodgkin lymphoma, unspecified, extranodal and solid organ sites: Secondary | ICD-10-CM

## 2024-01-27 DIAGNOSIS — C8379 Burkitt lymphoma, extranodal and solid organ sites: Secondary | ICD-10-CM | POA: Insufficient documentation

## 2024-01-29 NOTE — Progress Notes (Unsigned)
 [facility]  [address] [place],  [place]  [address] [phone]  Clinic Day:  01/27/2024  Referring physician: [name], MD   HISTORY OF PRESENT ILLNESS:  The patient is an 89 y.o. female who I recently began seeing for newly diagnosed high-grade gastric lymphoma.  The patient comes in today to go over over PET scan results, as well as additional pathology regarding her gastric lymphoma.  Since her last visit, the patient has been doing okay.  However, she claims to have black stools every time she has a bowel movement.  However, she attributes this to taking Pepto-Bismol daily.  She denies having other overt forms of blood loss.  She also denies having any B symptoms or peripheral lymphadenopathy which concerns her for more advanced disease being overtly present.  PHYSICAL EXAM:  Blood pressure (!) 142/84, pulse 77, temperature 97.9 F (36.6 C), resp. rate 16, height 5' 5.5" (1.664 m), weight 118 lb (53.5 kg), SpO2 99%. Wt Readings from Last 3 Encounters:  01/27/24 118 lb (53.5 kg)  01/17/24 117 lb 12.8 oz (53.4 kg)  01/03/24 127 lb 6.8 oz (57.8 kg)   Body mass index is 19.34 kg/m. Performance status (ECOG): 2 - Symptomatic, <50% confined to bed Physical Exam Constitutional:      Appearance: Normal appearance. She is not ill-appearing.  HENT:     Mouth/Throat:     Mouth: Mucous membranes are moist.     Pharynx: Oropharynx is clear. No oropharyngeal exudate or posterior oropharyngeal erythema.  Cardiovascular:     Rate and Rhythm: Normal rate and regular rhythm.     Heart sounds: No murmur heard.    No friction rub. No gallop.  Pulmonary:     Effort: Pulmonary effort is normal. No respiratory distress.     Breath sounds: Normal breath sounds. No wheezing, rhonchi or rales.  Abdominal:     General: Bowel sounds are normal. There is no distension.     Palpations: Abdomen is soft. There is no mass.     Tenderness: There is no  abdominal tenderness.  Musculoskeletal:        General: No swelling.     Right lower leg: No edema.     Left lower leg: No edema.  Lymphadenopathy:     Cervical: No cervical adenopathy.     Upper Body:     Right upper body: No supraclavicular or axillary adenopathy.     Left upper body: No supraclavicular or axillary adenopathy.     Lower Body: No right inguinal adenopathy. No left inguinal adenopathy.  Skin:    General: Skin is warm.     Coloration: Skin is not jaundiced.     Findings: No lesion or rash.  Neurological:     General: No focal deficit present.     Mental Status: She is alert and oriented to person, place, and time. Mental status is at baseline.  Psychiatric:        Mood and Affect: Mood normal.        Behavior: Behavior normal.        Thought Content: Thought content normal.    PATHOLOGY:  Her gastric biopsies revealed the following: FINAL MICROSCOPIC DIAGNOSIS:  A. STOMACH, ANTRUM, BIOPSY: - H. pylori gastritis - Positive for H. pylori on immunohistochemical stain - Negative for intestinal metaplasia, dysplasia or malignancy  B. STOMACH, ULCERATION, BIOPSY: - High-grade B-cell lymphoma.  See comment.  COMMENT:  Part B: Morphologic evaluation reveals gastric  mucosa with a dense submucosal lymphoid infiltrate comprised of medium to large sized cells with areas of necrosis.  Immunohistochemical stains reveal the tumor cells are positive for CD20, PAX5, and weak Bcl-2 staining.  CD3 and CD5 highlight background T lymphocytes.  The Ki-67 proliferation index is elevated approximately 90%.  CD10 and BCL6 appear to be positive in the neoplastic cells.  EBER-ISH, CD30 and cyclin D1 are negative.  MUM1-1 shows equivocal staining.  The findings are consistent with a large B-cell lymphoma, germinal center origin, with a high Ki-67 proliferation index.  Given the weak Bcl-2 staining, a (less likely) possibility is Burkitt lymphoma.  High-grade B-cell lymphoma panel  will be ordered for further assessment and to rule out double/triple hit lymphoma, and reported in an addendum.   ADDENDUM:  - High-grade B-cell lymphoma FISH panel revealed t(8,14) consistent with  a Burkitt lymphoma.  Please see additional details in scanned report.   FISH testing for high-grade B-cell lymphomas revealed the following:   LABS:      Latest Ref Rng & Units 01/17/2024    2:34 PM 01/05/2024    3:46 AM 01/04/2024    2:56 PM  CBC  WBC 4.0 - 10.5 K/uL 7.4  8.2  9.2   Hemoglobin 12.0 - 15.0 g/dL 19.1  9.4  9.8   Hematocrit 36.0 - 46.0 % 34.2  27.4  29.4   Platelets 150 - 400 K/uL 230  163  165       Latest Ref Rng & Units 01/17/2024    2:34 PM 01/05/2024    3:46 AM 01/04/2024    3:45 AM  CMP  Glucose 70 - 99 mg/dL 478  99  99   BUN 8 - 23 mg/dL 21  15  31    Creatinine 0.44 - 1.00 mg/dL 2.95  6.21  3.08   Sodium 135 - 145 mmol/L 138  138  136   Potassium 3.5 - 5.1 mmol/L 4.0  3.3  3.5   Chloride 98 - 111 mmol/L 102  109  110   CO2 22 - 32 mmol/L 23  22  21    Calcium 8.9 - 10.3 mg/dL 8.9  7.2  7.4   Total Protein 6.5 - 8.1 g/dL 6.1     Total Bilirubin 0.0 - 1.2 mg/dL 0.3     Alkaline Phos 38 - 126 U/L 44     AST 15 - 41 U/L 30     ALT 0 - 44 U/L 19       Latest Reference Range & Units 01/17/24 14:34  LDH 98 - 192 U/L 296 (H)  (H): Data is abnormally high  STUDIES:  Her PET scan revealed the following:  FINDINGS: There is increased PET activity identified within the stomach consistent with the patient's history of biopsy-proven lymphoma here. Uptake levels measure at 8.5.  There is no other suspicious increased PET activity identified. Specifically, no evidence of activity within the axilla either lung field, the mediastinum, within the mesentery or retroperitoneum.  There is normal activity identified within the brain, left ventricle, solid organs and osseous structures.  IMPRESSION: Increased PET activity identified within the stomach as expected with  biopsy-proven lymphoma here. As with the previous CT, current CT shows gastric wall thickening.  No other suspicious areas of increased PET activity noted to suspect diffuse metastasis.  ASSESSMENT & P LAN:  An 89 y.o. female with a high-grade B-cell gastric lymphoma.  From additional testing of her gastric biopsy tissue, this disease harbors  a translocation 8; 14, which is essentially pathognomonic for Burkitt lymphoma.  In clinic today, I went over her PET scan images with her, for which she could see that she currently has no other sites of disease outside of her stomach.  Nevertheless, the patient understands that she has a very aggressive B-cell lymphoma which has a propensity to grow very quickly and spread if not treated aggressively.  I also explained to the patient that standard therapy for Burkitt lymphoma is usually not given at community facilities.  It usually requires a more complex regimen given at a tertiary care facility.  I did end up getting in contact with a malignant hematologist at [facility], who will see her early next week to discuss potential treatment options.  Even though she is 89 years old, she appears to be relatively healthy to where she could potentially withstand an aggressive chemotherapy regimen for Burkitt lymphoma.  I will see this patient back in 2 months for repeat clinical assessment.  However, the patient knows to contact our office before then if she has additional questions or concerns regarding her disease management.   Morna Flud Kirby Funk, MD

## 2024-01-29 NOTE — Progress Notes (Incomplete)
 [facility]  [address] [place],  [place]  [address] [phone]  Clinic Day:  01/27/2024  Referring physician: [name], MD   HISTORY OF PRESENT ILLNESS:  The patient is an 89 y.o. female who I recently began seeing for newly diagnosed high-grade gastric lymphoma.  The patient comes in today to go over over PET scan results, as well as additional pathology regarding gastric involvement.  Since her last visit, the patient has been doing okay.  However, she claims to have black stools every time of the bowel movement.  However, she attributes this to taking Pepto-Bismol developed today.  Denies having other overt forms of blood loss.  She also denies having any B symptoms or peripheral lymphadenopathy which concerns her for more advanced disease being present. PHYSICAL EXAM:  Blood pressure (!) 142/84, pulse 77, temperature 97.9 F (36.6 C), resp. rate 16, height 5' 5.5" (1.664 m), weight 118 lb (53.5 kg), SpO2 99%. Wt Readings from Last 3 Encounters:  01/27/24 118 lb (53.5 kg)  01/17/24 117 lb 12.8 oz (53.4 kg)  01/03/24 127 lb 6.8 oz (57.8 kg)   Body mass index is 19.34 kg/m. Performance status (ECOG): 2 - Symptomatic, <50% confined to bed Physical Exam Constitutional:      Appearance: Normal appearance. She is not ill-appearing.  HENT:     Mouth/Throat:     Mouth: Mucous membranes are moist.     Pharynx: Oropharynx is clear. No oropharyngeal exudate or posterior oropharyngeal erythema.  Cardiovascular:     Rate and Rhythm: Normal rate and regular rhythm.     Heart sounds: No murmur heard.    No friction rub. No gallop.  Pulmonary:     Effort: Pulmonary effort is normal. No respiratory distress.     Breath sounds: Normal breath sounds. No wheezing, rhonchi or rales.  Abdominal:     General: Bowel sounds are normal. There is no distension.     Palpations: Abdomen is soft. There is no mass.     Tenderness: There is no abdominal  tenderness.  Musculoskeletal:        General: No swelling.     Right lower leg: No edema.     Left lower leg: No edema.  Lymphadenopathy:     Cervical: No cervical adenopathy.     Upper Body:     Right upper body: No supraclavicular or axillary adenopathy.     Left upper body: No supraclavicular or axillary adenopathy.     Lower Body: No right inguinal adenopathy. No left inguinal adenopathy.  Skin:    General: Skin is warm.     Coloration: Skin is not jaundiced.     Findings: No lesion or rash.  Neurological:     General: No focal deficit present.     Mental Status: She is alert and oriented to person, place, and time. Mental status is at baseline.  Psychiatric:        Mood and Affect: Mood normal.        Behavior: Behavior normal.        Thought Content: Thought content normal.   PATHOLOGY:  A biopsy of one of her gastric ulcers revealed the following: FINAL MICROSCOPIC DIAGNOSIS:  A. STOMACH, ANTRUM, BIOPSY: - H. pylori gastritis - Positive for H. pylori on immunohistochemical stain - Negative for intestinal metaplasia, dysplasia or malignancy  B. STOMACH, ULCERATION, BIOPSY: - High-grade B-cell lymphoma.  See comment.  COMMENT:  Part B: Morphologic evaluation reveals gastric  mucosa with a dense submucosal lymphoid infiltrate comprised of medium to large sized cells with areas of necrosis.  Immunohistochemical stains reveal the tumor cells are positive for CD20, PAX5, and weak Bcl-2 staining.  CD3 and CD5 highlight background T lymphocytes.  The Ki-67 proliferation index is elevated approximately 90%.  CD10 and BCL6 appear to be positive in the neoplastic cells.  EBER-ISH, CD30 and cyclin D1 are negative.  MUM1-1 shows equivocal staining.  The findings are consistent with a large B-cell lymphoma, germinal center origin, with a high Ki-67 proliferation index.  Given the weak Bcl-2 staining, a (less likely) possibility is Burkitt lymphoma.  High-grade B-cell lymphoma  panel will be ordered for further assessment and to rule out double/triple hit lymphoma, and reported in an addendum.   LABS:      Latest Ref Rng & Units 01/17/2024    2:34 PM 01/05/2024    3:46 AM 01/04/2024    2:56 PM  CBC  WBC 4.0 - 10.5 K/uL 7.4  8.2  9.2   Hemoglobin 12.0 - 15.0 g/dL 09.8  9.4  9.8   Hematocrit 36.0 - 46.0 % 34.2  27.4  29.4   Platelets 150 - 400 K/uL 230  163  165       Latest Ref Rng & Units 01/17/2024    2:34 PM 01/05/2024    3:46 AM 01/04/2024    3:45 AM  CMP  Glucose 70 - 99 mg/dL 119  99  99   BUN 8 - 23 mg/dL 21  15  31    Creatinine 0.44 - 1.00 mg/dL 1.47  8.29  5.62   Sodium 135 - 145 mmol/L 138  138  136   Potassium 3.5 - 5.1 mmol/L 4.0  3.3  3.5   Chloride 98 - 111 mmol/L 102  109  110   CO2 22 - 32 mmol/L 23  22  21    Calcium 8.9 - 10.3 mg/dL 8.9  7.2  7.4   Total Protein 6.5 - 8.1 g/dL 6.1     Total Bilirubin 0.0 - 1.2 mg/dL 0.3     Alkaline Phos 38 - 126 U/L 44     AST 15 - 41 U/L 30     ALT 0 - 44 U/L 19       Latest Reference Range & Units 01/17/24 14:34  LDH 98 - 192 U/L 296 (H)  (H): Data is abnormally high STUDIES:  CT CHEST WO CONTRAST Result Date: 01/23/2024 CLINICAL DATA:  New diagnosis of gastric B-cell lymphoma. Evaluate for metastatic disease. * Tracking Code: BO * EXAM: CT CHEST WITHOUT CONTRAST TECHNIQUE: Multidetector CT imaging of the chest was performed following the standard protocol without IV contrast. RADIATION DOSE REDUCTION: This exam was performed according to the departmental dose-optimization program which includes automated exposure control, adjustment of the mA and/or kV according to patient size and/or use of iterative reconstruction technique. COMPARISON:  CT abdomen pelvis 01/02/2024. FINDINGS: Cardiovascular: Atherosclerotic calcification of the aorta and coronary arteries. Enlarged right and left pulmonary arteries. Heart is at the upper limits of normal in size to mildly enlarged. No pericardial effusion.  Mediastinum/Nodes: No pathologically enlarged mediastinal or axillary lymph nodes. Hilar regions are difficult to definitively evaluate without IV contrast. Lungs/Pleura: Subsegmental volume loss in both lower lobes. No suspicious pulmonary nodules. Mild cylindrical bronchiectasis. No pleural fluid. Airway is unremarkable. Upper Abdomen: Subcentimeter low-attenuation lesions in the right hepatic lobe, too small to characterize. Masslike gastric wall thickening, better seen on 01/02/2024. There  may be ill-defined gastrohepatic ligament adenopathy. Visualized portions of the liver, adrenal glands, kidneys, spleen, pancreas, stomach and bowel are otherwise grossly unremarkable. Musculoskeletal: Degenerative changes in the spine. Old right rib fractures. IMPRESSION: 1. No evidence of metastatic disease. Masslike gastric wall thickening and possible gastrohepatic ligament adenopathy, better seen on 01/02/2024. 2. Mild cylindrical bronchiectasis. 3. Aortic atherosclerosis (ICD10-I70.0). Coronary artery calcification. 4. Enlarged right and left pulmonary arteries, indicative of pulmonary arterial hypertension. Electronically Signed   By: Leanna Battles M.D.   On: 01/23/2024 16:23   CT ANGIO GI BLEED Result Date: 01/02/2024 CLINICAL DATA:  Lower GI bleeding EXAM: CTA ABDOMEN AND PELVIS WITHOUT AND WITH CONTRAST TECHNIQUE: Multidetector CT imaging of the abdomen and pelvis was performed using the standard protocol during bolus administration of intravenous contrast. Multiplanar reconstructed images and MIPs were obtained and reviewed to evaluate the vascular anatomy. RADIATION DOSE REDUCTION: This exam was performed according to the departmental dose-optimization program which includes automated exposure control, adjustment of the mA and/or kV according to patient size and/or use of iterative reconstruction technique. CONTRAST:  75mL OMNIPAQUE IOHEXOL 350 MG/ML SOLN COMPARISON:  CT 11/16/2020 FINDINGS: VASCULAR Aorta:  Normal caliber aorta without aneurysm, dissection, vasculitis or significant stenosis. Moderate atherosclerosis Celiac: At least moderate focal stenosis at the origin of the celiac trunk. Distal patency. No aneurysm, dissection or occlusion SMA: Patent without evidence of aneurysm, dissection, vasculitis or significant stenosis. Renals: Calcification at the origin of right renal artery without significant stenosis. Both dominant renal arteries are patent and without significant aneurysm dissection or stenosis. There are tiny lower pole accessory arteries bilaterally IMA: Diminutive but patent Inflow: Patent without evidence of aneurysm, dissection, vasculitis or significant stenosis. Proximal Outflow: Bilateral common femoral and visualized portions of the superficial and profunda femoral arteries are patent without evidence of aneurysm, dissection, vasculitis or significant stenosis. Veins: Portal and splenic veins are patent. Review of the MIP images confirms the above findings. NON-VASCULAR Lower chest: Lung bases demonstrate mild scarring at the bases. No acute airspace disease Hepatobiliary: Subcentimeter hypodensity in the right hepatic lobe too small to further characterize but unchanged and therefore likely benign. No gallstones, gallbladder wall thickening, or biliary dilatation. Pancreas: Unremarkable. No pancreatic ductal dilatation or surrounding inflammatory changes. Spleen: Normal in size without focal abnormality. Adrenals/Urinary Tract: Adrenal glands are normal. Kidneys show no hydronephrosis. The bladder is unremarkable Stomach/Bowel: Irregular masslike thickening at the gastric cardia and lesser curvature of stomach, series 14, image 17 through 34 and coronal series 17 image 90. No dilated small bowel. Diverticular disease of the colon. Possible minimal fat stranding and wall thickening at the splenic flexure/proximal descending colon as may be seen with mild diverticulitis. No intraluminal  extravasation to suggest active GI bleeding on this exam. Lymphatic: No suspicious lymph nodes Reproductive: Uterus is atrophic.  2.2 cm left adnexal cyst. Other: Negative for pelvic effusion or free air Musculoskeletal: Right hip replacement. Multilevel degenerative changes. Mild chronic superior endplate deformity at T12. question mild posterior cortical erosive change at the L2 vertebral body, sagittal series 18, image 108. IMPRESSION: 1. Negative for active GI bleeding. Diverticular disease of the colon with possible minimal wall thickening and fat stranding at the splenic flexure/proximal descending colon as may be seen with an acute diverticulitis. 2. Irregular masslike thickening at the gastric cardia and lesser curvature of stomach, concern raised for gastric malignancy. Correlation with endoscopy is recommended 3. Moderate atherosclerosis of the aorta with at least moderate stenosis at the origin of the celiac trunk. Negative  for dissection or occlusive disease 4. Question mild posterior cortical erosive change at the L2 vertebral body. This could be further assessed with MRI 5. Aortic atherosclerosis. Electronically Signed   By: Jasmine Pang M.D.   On: 01/02/2024 16:45   CT Head Wo Contrast Result Date: 01/02/2024 CLINICAL DATA:  Head trauma, minor (Age >= 65y). EXAM: CT HEAD WITHOUT CONTRAST TECHNIQUE: Contiguous axial images were obtained from the base of the skull through the vertex without intravenous contrast. RADIATION DOSE REDUCTION: This exam was performed according to the departmental dose-optimization program which includes automated exposure control, adjustment of the mA and/or kV according to patient size and/or use of iterative reconstruction technique. COMPARISON:  None Available. FINDINGS: Brain: There is no evidence of an acute infarct, intracranial hemorrhage, mass, midline shift, or extra-axial fluid collection. Mild cerebral atrophy is within normal limits for age. Cerebral white  matter hypodensities are nonspecific but compatible with mild chronic small vessel ischemic disease. Vascular: No hyperdense vessel or unexpected calcification. Skull: No acute fracture or suspicious lesion. Sinuses/Orbits: Visualized paranasal sinuses and mastoid air cells are clear. Unremarkable included orbits. Other: None. IMPRESSION: 1. No evidence of acute intracranial abnormality. 2. Mild chronic small vessel ischemic disease. Electronically Signed   By: Sebastian Ache M.D.   On: 01/02/2024 15:56     ASSESSMENT & PLAN:  An 89 y.o. female who I was asked to consult upon for having a high-grade gastric lymphoma.  Currently, the patient's tumor is undergoing additional testing to determine the exact type of high-grade lymphoma she has.  Furthermore, I would like for this patient undergo a PET scan to see if she has other areas of lymphoma outside of her stomach.  I will arrange for her to have a PET scan next week.  I will see her back at that time to go over both her PET scan images, as well as additional pathologic studies which can better help delineate this type of high-grade lymphoma she has.  All these results be used to formulate a treatment plan for her gastric lymphoma management.  The patient understands all the plans discussed today and is in agreement with them.  I do appreciate Burchette, Elberta Fortis, MD for his new consult.   Amaro Mangold Kirby Funk, MD

## 2024-01-30 ENCOUNTER — Telehealth: Payer: Self-pay

## 2024-01-30 DIAGNOSIS — C8379 Burkitt lymphoma, extranodal and solid organ sites: Secondary | ICD-10-CM | POA: Insufficient documentation

## 2024-01-30 NOTE — Telephone Encounter (Signed)
 Dr. Melvyn Neth spoke with Dr. Starleen Arms @ [facility] on Friday (01-27-2024) in ref to [name].  His office should be contacting patient to schedule.

## 2024-02-01 DIAGNOSIS — D630 Anemia in neoplastic disease: Secondary | ICD-10-CM | POA: Diagnosis present

## 2024-02-01 DIAGNOSIS — Z96641 Presence of right artificial hip joint: Secondary | ICD-10-CM | POA: Diagnosis present

## 2024-02-01 DIAGNOSIS — K219 Gastro-esophageal reflux disease without esophagitis: Secondary | ICD-10-CM | POA: Diagnosis present

## 2024-02-01 DIAGNOSIS — N3000 Acute cystitis without hematuria: Secondary | ICD-10-CM | POA: Diagnosis present

## 2024-02-01 DIAGNOSIS — C8510 Unspecified B-cell lymphoma, unspecified site: Secondary | ICD-10-CM | POA: Diagnosis present

## 2024-02-01 DIAGNOSIS — Z5111 Encounter for antineoplastic chemotherapy: Secondary | ICD-10-CM | POA: Diagnosis not present

## 2024-02-01 DIAGNOSIS — Z79899 Other long term (current) drug therapy: Secondary | ICD-10-CM | POA: Diagnosis not present

## 2024-02-01 DIAGNOSIS — I10 Essential (primary) hypertension: Secondary | ICD-10-CM | POA: Diagnosis present

## 2024-02-01 DIAGNOSIS — C8590 Non-Hodgkin lymphoma, unspecified, unspecified site: Secondary | ICD-10-CM | POA: Diagnosis not present

## 2024-02-01 DIAGNOSIS — C8370 Burkitt lymphoma, unspecified site: Secondary | ICD-10-CM | POA: Diagnosis present

## 2024-02-01 DIAGNOSIS — D6481 Anemia due to antineoplastic chemotherapy: Secondary | ICD-10-CM | POA: Diagnosis present

## 2024-02-01 DIAGNOSIS — I083 Combined rheumatic disorders of mitral, aortic and tricuspid valves: Secondary | ICD-10-CM | POA: Diagnosis not present

## 2024-02-01 DIAGNOSIS — E785 Hyperlipidemia, unspecified: Secondary | ICD-10-CM | POA: Diagnosis present

## 2024-02-01 DIAGNOSIS — T451X5A Adverse effect of antineoplastic and immunosuppressive drugs, initial encounter: Secondary | ICD-10-CM | POA: Diagnosis present

## 2024-02-02 ENCOUNTER — Other Ambulatory Visit: Payer: Self-pay

## 2024-02-02 DIAGNOSIS — K219 Gastro-esophageal reflux disease without esophagitis: Secondary | ICD-10-CM | POA: Diagnosis present

## 2024-02-02 DIAGNOSIS — Z5111 Encounter for antineoplastic chemotherapy: Secondary | ICD-10-CM | POA: Diagnosis not present

## 2024-02-02 DIAGNOSIS — I083 Combined rheumatic disorders of mitral, aortic and tricuspid valves: Secondary | ICD-10-CM | POA: Diagnosis not present

## 2024-02-02 DIAGNOSIS — D630 Anemia in neoplastic disease: Secondary | ICD-10-CM | POA: Diagnosis present

## 2024-02-02 DIAGNOSIS — E785 Hyperlipidemia, unspecified: Secondary | ICD-10-CM | POA: Diagnosis present

## 2024-02-02 DIAGNOSIS — Z79899 Other long term (current) drug therapy: Secondary | ICD-10-CM | POA: Diagnosis not present

## 2024-02-02 DIAGNOSIS — C8510 Unspecified B-cell lymphoma, unspecified site: Secondary | ICD-10-CM | POA: Diagnosis present

## 2024-02-02 DIAGNOSIS — C8370 Burkitt lymphoma, unspecified site: Secondary | ICD-10-CM | POA: Diagnosis present

## 2024-02-02 DIAGNOSIS — D6481 Anemia due to antineoplastic chemotherapy: Secondary | ICD-10-CM | POA: Diagnosis present

## 2024-02-02 DIAGNOSIS — N3000 Acute cystitis without hematuria: Secondary | ICD-10-CM | POA: Diagnosis present

## 2024-02-02 DIAGNOSIS — I10 Essential (primary) hypertension: Secondary | ICD-10-CM | POA: Diagnosis present

## 2024-02-02 DIAGNOSIS — Z96641 Presence of right artificial hip joint: Secondary | ICD-10-CM | POA: Diagnosis present

## 2024-02-02 DIAGNOSIS — T451X5A Adverse effect of antineoplastic and immunosuppressive drugs, initial encounter: Secondary | ICD-10-CM | POA: Diagnosis present

## 2024-02-02 NOTE — Progress Notes (Signed)
 This information is for discussion only and not part of the official treatment plan.

## 2024-02-03 DIAGNOSIS — N3000 Acute cystitis without hematuria: Secondary | ICD-10-CM | POA: Diagnosis not present

## 2024-02-03 DIAGNOSIS — D6481 Anemia due to antineoplastic chemotherapy: Secondary | ICD-10-CM | POA: Diagnosis not present

## 2024-02-03 DIAGNOSIS — I10 Essential (primary) hypertension: Secondary | ICD-10-CM | POA: Diagnosis not present

## 2024-02-03 DIAGNOSIS — C8510 Unspecified B-cell lymphoma, unspecified site: Secondary | ICD-10-CM | POA: Diagnosis not present

## 2024-02-03 DIAGNOSIS — Z5111 Encounter for antineoplastic chemotherapy: Secondary | ICD-10-CM | POA: Diagnosis not present

## 2024-02-04 DIAGNOSIS — N3000 Acute cystitis without hematuria: Secondary | ICD-10-CM | POA: Diagnosis not present

## 2024-02-04 DIAGNOSIS — Z5111 Encounter for antineoplastic chemotherapy: Secondary | ICD-10-CM | POA: Diagnosis not present

## 2024-02-04 DIAGNOSIS — D6481 Anemia due to antineoplastic chemotherapy: Secondary | ICD-10-CM | POA: Diagnosis not present

## 2024-02-04 DIAGNOSIS — C8510 Unspecified B-cell lymphoma, unspecified site: Secondary | ICD-10-CM | POA: Diagnosis not present

## 2024-02-04 DIAGNOSIS — I10 Essential (primary) hypertension: Secondary | ICD-10-CM | POA: Diagnosis not present

## 2024-02-06 DIAGNOSIS — C8510 Unspecified B-cell lymphoma, unspecified site: Secondary | ICD-10-CM | POA: Diagnosis not present

## 2024-02-06 DIAGNOSIS — Z7689 Persons encountering health services in other specified circumstances: Secondary | ICD-10-CM | POA: Diagnosis not present

## 2024-02-08 DIAGNOSIS — Z7689 Persons encountering health services in other specified circumstances: Secondary | ICD-10-CM | POA: Diagnosis not present

## 2024-02-08 DIAGNOSIS — C8510 Unspecified B-cell lymphoma, unspecified site: Secondary | ICD-10-CM | POA: Diagnosis not present

## 2024-02-09 DIAGNOSIS — C8590 Non-Hodgkin lymphoma, unspecified, unspecified site: Secondary | ICD-10-CM | POA: Diagnosis not present

## 2024-02-15 DIAGNOSIS — C8510 Unspecified B-cell lymphoma, unspecified site: Secondary | ICD-10-CM | POA: Diagnosis not present

## 2024-02-15 DIAGNOSIS — I10 Essential (primary) hypertension: Secondary | ICD-10-CM | POA: Diagnosis not present

## 2024-02-22 DIAGNOSIS — C8510 Unspecified B-cell lymphoma, unspecified site: Secondary | ICD-10-CM | POA: Diagnosis not present

## 2024-02-22 DIAGNOSIS — Z5111 Encounter for antineoplastic chemotherapy: Secondary | ICD-10-CM | POA: Diagnosis not present

## 2024-02-22 DIAGNOSIS — C8370 Burkitt lymphoma, unspecified site: Secondary | ICD-10-CM | POA: Diagnosis not present

## 2024-02-24 ENCOUNTER — Other Ambulatory Visit: Payer: Self-pay | Admitting: Family Medicine

## 2024-02-27 DIAGNOSIS — C8370 Burkitt lymphoma, unspecified site: Secondary | ICD-10-CM | POA: Diagnosis not present

## 2024-02-27 DIAGNOSIS — C8510 Unspecified B-cell lymphoma, unspecified site: Secondary | ICD-10-CM | POA: Diagnosis not present

## 2024-02-27 DIAGNOSIS — Z5112 Encounter for antineoplastic immunotherapy: Secondary | ICD-10-CM | POA: Diagnosis not present

## 2024-02-27 DIAGNOSIS — Z5111 Encounter for antineoplastic chemotherapy: Secondary | ICD-10-CM | POA: Diagnosis not present

## 2024-02-27 DIAGNOSIS — R799 Abnormal finding of blood chemistry, unspecified: Secondary | ICD-10-CM | POA: Diagnosis not present

## 2024-03-05 DIAGNOSIS — C8370 Burkitt lymphoma, unspecified site: Secondary | ICD-10-CM | POA: Diagnosis not present

## 2024-03-05 DIAGNOSIS — C8510 Unspecified B-cell lymphoma, unspecified site: Secondary | ICD-10-CM | POA: Diagnosis not present

## 2024-03-05 DIAGNOSIS — Z5111 Encounter for antineoplastic chemotherapy: Secondary | ICD-10-CM | POA: Diagnosis not present

## 2024-03-05 DIAGNOSIS — R799 Abnormal finding of blood chemistry, unspecified: Secondary | ICD-10-CM | POA: Diagnosis not present

## 2024-03-05 DIAGNOSIS — Z5112 Encounter for antineoplastic immunotherapy: Secondary | ICD-10-CM | POA: Diagnosis not present

## 2024-03-12 DIAGNOSIS — Z5111 Encounter for antineoplastic chemotherapy: Secondary | ICD-10-CM | POA: Diagnosis not present

## 2024-03-12 DIAGNOSIS — C8510 Unspecified B-cell lymphoma, unspecified site: Secondary | ICD-10-CM | POA: Diagnosis not present

## 2024-03-12 DIAGNOSIS — R799 Abnormal finding of blood chemistry, unspecified: Secondary | ICD-10-CM | POA: Diagnosis not present

## 2024-03-12 DIAGNOSIS — C8370 Burkitt lymphoma, unspecified site: Secondary | ICD-10-CM | POA: Diagnosis not present

## 2024-03-12 DIAGNOSIS — Z5112 Encounter for antineoplastic immunotherapy: Secondary | ICD-10-CM | POA: Diagnosis not present

## 2024-03-13 ENCOUNTER — Telehealth: Payer: Self-pay | Admitting: Oncology

## 2024-03-13 NOTE — Telephone Encounter (Signed)
 03/13/24 Spoke with patient and she will call back to reschedule appts.

## 2024-03-26 DIAGNOSIS — D630 Anemia in neoplastic disease: Secondary | ICD-10-CM | POA: Diagnosis not present

## 2024-03-26 DIAGNOSIS — C3490 Malignant neoplasm of unspecified part of unspecified bronchus or lung: Secondary | ICD-10-CM | POA: Diagnosis not present

## 2024-03-26 DIAGNOSIS — Z5111 Encounter for antineoplastic chemotherapy: Secondary | ICD-10-CM | POA: Diagnosis not present

## 2024-03-26 DIAGNOSIS — R300 Dysuria: Secondary | ICD-10-CM | POA: Diagnosis not present

## 2024-03-26 DIAGNOSIS — C8510 Unspecified B-cell lymphoma, unspecified site: Secondary | ICD-10-CM | POA: Diagnosis not present

## 2024-03-26 DIAGNOSIS — D649 Anemia, unspecified: Secondary | ICD-10-CM | POA: Diagnosis not present

## 2024-03-26 DIAGNOSIS — C7951 Secondary malignant neoplasm of bone: Secondary | ICD-10-CM | POA: Diagnosis not present

## 2024-03-26 DIAGNOSIS — C8370 Burkitt lymphoma, unspecified site: Secondary | ICD-10-CM | POA: Diagnosis not present

## 2024-03-26 DIAGNOSIS — C8258 Diffuse follicle center lymphoma, lymph nodes of multiple sites: Secondary | ICD-10-CM | POA: Diagnosis not present

## 2024-03-26 DIAGNOSIS — Z5112 Encounter for antineoplastic immunotherapy: Secondary | ICD-10-CM | POA: Diagnosis not present

## 2024-03-27 ENCOUNTER — Ambulatory Visit: Admitting: Oncology

## 2024-03-27 ENCOUNTER — Other Ambulatory Visit

## 2024-04-02 DIAGNOSIS — D649 Anemia, unspecified: Secondary | ICD-10-CM | POA: Diagnosis not present

## 2024-04-02 DIAGNOSIS — R300 Dysuria: Secondary | ICD-10-CM | POA: Diagnosis not present

## 2024-04-02 DIAGNOSIS — D630 Anemia in neoplastic disease: Secondary | ICD-10-CM | POA: Diagnosis not present

## 2024-04-02 DIAGNOSIS — C3490 Malignant neoplasm of unspecified part of unspecified bronchus or lung: Secondary | ICD-10-CM | POA: Diagnosis not present

## 2024-04-02 DIAGNOSIS — C8370 Burkitt lymphoma, unspecified site: Secondary | ICD-10-CM | POA: Diagnosis not present

## 2024-04-02 DIAGNOSIS — C8510 Unspecified B-cell lymphoma, unspecified site: Secondary | ICD-10-CM | POA: Diagnosis not present

## 2024-04-02 DIAGNOSIS — C7951 Secondary malignant neoplasm of bone: Secondary | ICD-10-CM | POA: Diagnosis not present

## 2024-04-02 DIAGNOSIS — Z5111 Encounter for antineoplastic chemotherapy: Secondary | ICD-10-CM | POA: Diagnosis not present

## 2024-04-02 DIAGNOSIS — Z5112 Encounter for antineoplastic immunotherapy: Secondary | ICD-10-CM | POA: Diagnosis not present

## 2024-04-09 DIAGNOSIS — C8258 Diffuse follicle center lymphoma, lymph nodes of multiple sites: Secondary | ICD-10-CM | POA: Diagnosis not present

## 2024-04-09 DIAGNOSIS — I10 Essential (primary) hypertension: Secondary | ICD-10-CM | POA: Diagnosis not present

## 2024-04-09 DIAGNOSIS — Z9221 Personal history of antineoplastic chemotherapy: Secondary | ICD-10-CM | POA: Diagnosis not present

## 2024-04-09 DIAGNOSIS — C8510 Unspecified B-cell lymphoma, unspecified site: Secondary | ICD-10-CM | POA: Diagnosis not present

## 2024-04-09 DIAGNOSIS — R7309 Other abnormal glucose: Secondary | ICD-10-CM | POA: Diagnosis not present

## 2024-04-09 DIAGNOSIS — C8333 Diffuse large B-cell lymphoma, intra-abdominal lymph nodes: Secondary | ICD-10-CM | POA: Diagnosis not present

## 2024-04-09 DIAGNOSIS — I2510 Atherosclerotic heart disease of native coronary artery without angina pectoris: Secondary | ICD-10-CM | POA: Diagnosis not present

## 2024-04-09 DIAGNOSIS — J479 Bronchiectasis, uncomplicated: Secondary | ICD-10-CM | POA: Diagnosis not present

## 2024-04-09 DIAGNOSIS — I700 Atherosclerosis of aorta: Secondary | ICD-10-CM | POA: Diagnosis not present

## 2024-04-16 DIAGNOSIS — C7951 Secondary malignant neoplasm of bone: Secondary | ICD-10-CM | POA: Diagnosis not present

## 2024-04-16 DIAGNOSIS — Z5112 Encounter for antineoplastic immunotherapy: Secondary | ICD-10-CM | POA: Diagnosis not present

## 2024-04-16 DIAGNOSIS — C3490 Malignant neoplasm of unspecified part of unspecified bronchus or lung: Secondary | ICD-10-CM | POA: Diagnosis not present

## 2024-04-16 DIAGNOSIS — C8510 Unspecified B-cell lymphoma, unspecified site: Secondary | ICD-10-CM | POA: Diagnosis not present

## 2024-04-16 DIAGNOSIS — R300 Dysuria: Secondary | ICD-10-CM | POA: Diagnosis not present

## 2024-04-16 DIAGNOSIS — Z5111 Encounter for antineoplastic chemotherapy: Secondary | ICD-10-CM | POA: Diagnosis not present

## 2024-04-16 DIAGNOSIS — D630 Anemia in neoplastic disease: Secondary | ICD-10-CM | POA: Diagnosis not present

## 2024-04-16 DIAGNOSIS — C8370 Burkitt lymphoma, unspecified site: Secondary | ICD-10-CM | POA: Diagnosis not present

## 2024-04-16 DIAGNOSIS — D649 Anemia, unspecified: Secondary | ICD-10-CM | POA: Diagnosis not present

## 2024-04-23 DIAGNOSIS — C8370 Burkitt lymphoma, unspecified site: Secondary | ICD-10-CM | POA: Diagnosis not present

## 2024-04-23 DIAGNOSIS — Z5111 Encounter for antineoplastic chemotherapy: Secondary | ICD-10-CM | POA: Diagnosis not present

## 2024-04-23 DIAGNOSIS — D630 Anemia in neoplastic disease: Secondary | ICD-10-CM | POA: Diagnosis not present

## 2024-04-23 DIAGNOSIS — C8510 Unspecified B-cell lymphoma, unspecified site: Secondary | ICD-10-CM | POA: Diagnosis not present

## 2024-04-23 DIAGNOSIS — C3490 Malignant neoplasm of unspecified part of unspecified bronchus or lung: Secondary | ICD-10-CM | POA: Diagnosis not present

## 2024-04-23 DIAGNOSIS — R300 Dysuria: Secondary | ICD-10-CM | POA: Diagnosis not present

## 2024-04-23 DIAGNOSIS — C7951 Secondary malignant neoplasm of bone: Secondary | ICD-10-CM | POA: Diagnosis not present

## 2024-04-23 DIAGNOSIS — Z5112 Encounter for antineoplastic immunotherapy: Secondary | ICD-10-CM | POA: Diagnosis not present

## 2024-04-23 DIAGNOSIS — D649 Anemia, unspecified: Secondary | ICD-10-CM | POA: Diagnosis not present

## 2024-04-30 DIAGNOSIS — C8370 Burkitt lymphoma, unspecified site: Secondary | ICD-10-CM | POA: Diagnosis not present

## 2024-04-30 DIAGNOSIS — D649 Anemia, unspecified: Secondary | ICD-10-CM | POA: Diagnosis not present

## 2024-04-30 DIAGNOSIS — C3490 Malignant neoplasm of unspecified part of unspecified bronchus or lung: Secondary | ICD-10-CM | POA: Diagnosis not present

## 2024-04-30 DIAGNOSIS — Z5111 Encounter for antineoplastic chemotherapy: Secondary | ICD-10-CM | POA: Diagnosis not present

## 2024-04-30 DIAGNOSIS — Z5112 Encounter for antineoplastic immunotherapy: Secondary | ICD-10-CM | POA: Diagnosis not present

## 2024-04-30 DIAGNOSIS — C7951 Secondary malignant neoplasm of bone: Secondary | ICD-10-CM | POA: Diagnosis not present

## 2024-04-30 DIAGNOSIS — D630 Anemia in neoplastic disease: Secondary | ICD-10-CM | POA: Diagnosis not present

## 2024-04-30 DIAGNOSIS — C8510 Unspecified B-cell lymphoma, unspecified site: Secondary | ICD-10-CM | POA: Diagnosis not present

## 2024-04-30 DIAGNOSIS — D696 Thrombocytopenia, unspecified: Secondary | ICD-10-CM | POA: Diagnosis not present

## 2024-05-07 DIAGNOSIS — Z5111 Encounter for antineoplastic chemotherapy: Secondary | ICD-10-CM | POA: Diagnosis not present

## 2024-05-07 DIAGNOSIS — D630 Anemia in neoplastic disease: Secondary | ICD-10-CM | POA: Diagnosis not present

## 2024-05-07 DIAGNOSIS — C3490 Malignant neoplasm of unspecified part of unspecified bronchus or lung: Secondary | ICD-10-CM | POA: Diagnosis not present

## 2024-05-07 DIAGNOSIS — Z5112 Encounter for antineoplastic immunotherapy: Secondary | ICD-10-CM | POA: Diagnosis not present

## 2024-05-07 DIAGNOSIS — D649 Anemia, unspecified: Secondary | ICD-10-CM | POA: Diagnosis not present

## 2024-05-07 DIAGNOSIS — D696 Thrombocytopenia, unspecified: Secondary | ICD-10-CM | POA: Diagnosis not present

## 2024-05-07 DIAGNOSIS — C8370 Burkitt lymphoma, unspecified site: Secondary | ICD-10-CM | POA: Diagnosis not present

## 2024-05-07 DIAGNOSIS — C7951 Secondary malignant neoplasm of bone: Secondary | ICD-10-CM | POA: Diagnosis not present

## 2024-05-07 DIAGNOSIS — C8510 Unspecified B-cell lymphoma, unspecified site: Secondary | ICD-10-CM | POA: Diagnosis not present

## 2024-05-14 DIAGNOSIS — C3490 Malignant neoplasm of unspecified part of unspecified bronchus or lung: Secondary | ICD-10-CM | POA: Diagnosis not present

## 2024-05-14 DIAGNOSIS — C8370 Burkitt lymphoma, unspecified site: Secondary | ICD-10-CM | POA: Diagnosis not present

## 2024-05-14 DIAGNOSIS — D630 Anemia in neoplastic disease: Secondary | ICD-10-CM | POA: Diagnosis not present

## 2024-05-14 DIAGNOSIS — D649 Anemia, unspecified: Secondary | ICD-10-CM | POA: Diagnosis not present

## 2024-05-14 DIAGNOSIS — C7951 Secondary malignant neoplasm of bone: Secondary | ICD-10-CM | POA: Diagnosis not present

## 2024-05-14 DIAGNOSIS — D696 Thrombocytopenia, unspecified: Secondary | ICD-10-CM | POA: Diagnosis not present

## 2024-05-14 DIAGNOSIS — Z5111 Encounter for antineoplastic chemotherapy: Secondary | ICD-10-CM | POA: Diagnosis not present

## 2024-05-14 DIAGNOSIS — Z5112 Encounter for antineoplastic immunotherapy: Secondary | ICD-10-CM | POA: Diagnosis not present

## 2024-05-14 DIAGNOSIS — C8510 Unspecified B-cell lymphoma, unspecified site: Secondary | ICD-10-CM | POA: Diagnosis not present

## 2024-05-15 DIAGNOSIS — D630 Anemia in neoplastic disease: Secondary | ICD-10-CM | POA: Diagnosis not present

## 2024-05-15 DIAGNOSIS — C7951 Secondary malignant neoplasm of bone: Secondary | ICD-10-CM | POA: Diagnosis not present

## 2024-05-15 DIAGNOSIS — D696 Thrombocytopenia, unspecified: Secondary | ICD-10-CM | POA: Diagnosis not present

## 2024-05-15 DIAGNOSIS — Z5112 Encounter for antineoplastic immunotherapy: Secondary | ICD-10-CM | POA: Diagnosis not present

## 2024-05-15 DIAGNOSIS — Z5111 Encounter for antineoplastic chemotherapy: Secondary | ICD-10-CM | POA: Diagnosis not present

## 2024-05-15 DIAGNOSIS — C3490 Malignant neoplasm of unspecified part of unspecified bronchus or lung: Secondary | ICD-10-CM | POA: Diagnosis not present

## 2024-05-15 DIAGNOSIS — C8510 Unspecified B-cell lymphoma, unspecified site: Secondary | ICD-10-CM | POA: Diagnosis not present

## 2024-05-15 DIAGNOSIS — D649 Anemia, unspecified: Secondary | ICD-10-CM | POA: Diagnosis not present

## 2024-05-15 DIAGNOSIS — C8370 Burkitt lymphoma, unspecified site: Secondary | ICD-10-CM | POA: Diagnosis not present

## 2024-05-21 DIAGNOSIS — C8370 Burkitt lymphoma, unspecified site: Secondary | ICD-10-CM | POA: Diagnosis not present

## 2024-05-21 DIAGNOSIS — Z5111 Encounter for antineoplastic chemotherapy: Secondary | ICD-10-CM | POA: Diagnosis not present

## 2024-05-21 DIAGNOSIS — D696 Thrombocytopenia, unspecified: Secondary | ICD-10-CM | POA: Diagnosis not present

## 2024-05-21 DIAGNOSIS — C7951 Secondary malignant neoplasm of bone: Secondary | ICD-10-CM | POA: Diagnosis not present

## 2024-05-21 DIAGNOSIS — D649 Anemia, unspecified: Secondary | ICD-10-CM | POA: Diagnosis not present

## 2024-05-21 DIAGNOSIS — C8510 Unspecified B-cell lymphoma, unspecified site: Secondary | ICD-10-CM | POA: Diagnosis not present

## 2024-05-21 DIAGNOSIS — Z5112 Encounter for antineoplastic immunotherapy: Secondary | ICD-10-CM | POA: Diagnosis not present

## 2024-05-21 DIAGNOSIS — D630 Anemia in neoplastic disease: Secondary | ICD-10-CM | POA: Diagnosis not present

## 2024-05-21 DIAGNOSIS — C3490 Malignant neoplasm of unspecified part of unspecified bronchus or lung: Secondary | ICD-10-CM | POA: Diagnosis not present

## 2024-05-23 ENCOUNTER — Telehealth: Payer: Self-pay

## 2024-05-23 ENCOUNTER — Other Ambulatory Visit: Payer: Self-pay | Admitting: Family Medicine

## 2024-05-23 MED ORDER — BISOPROLOL-HYDROCHLOROTHIAZIDE 5-6.25 MG PO TABS: 1.0000 | ORAL_TABLET | Freq: Every day | ORAL | 0 refills | Status: DC

## 2024-05-23 NOTE — Telephone Encounter (Signed)
 Rx sent.

## 2024-05-23 NOTE — Telephone Encounter (Signed)
 Copied from CRM [id number]. Topic: Clinical - Medication Refill >> May 23, 2024  8:17 AM Aisha D wrote: Medication: bisoprolol -hydrochlorothiazide  (ZIAC ) 5-6.25 MG table  Has the patient contacted their pharmacy? Yes (Agent: If no, request that the patient contact the pharmacy for the refill. If patient does not wish to contact the pharmacy document the reason why and proceed with request.) (Agent: If yes, when and what did the pharmacy advise?)  This is the patient's preferred pharmacy:  [id number] - [place], [place] - [address] [facility] [place] [address] Phone: [phone] Fax: [phone]   Is this the correct pharmacy for this prescription? Yes If no, delete pharmacy and type the correct one.   Has the prescription been filled recently? No  Is the patient out of the medication? Yes  Has the patient been seen for an appointment in the last year OR does the patient have an upcoming appointment? Yes  Can we respond through MyChart? No  Agent: Please be advised that Rx refills may take up to 3 business days. We ask that you follow-up with your pharmacy.

## 2024-05-23 NOTE — Telephone Encounter (Signed)
 Copied from CRM [id number]. Topic: Clinical - Medication Question >> May 23, 2024  8:18 AM Revonda D wrote: Reason for CRM: Pt stated that she is our of the bisoprolol -hydrochlorothiazide  (ZIAC ) 5-6.25 MG table and would like to have the medication refilled today. Pt has submitted a medication refill request but wanted to see if Dr.Burchette would approve it today due to everything she has been dealing with. Pt would like a callback with an update on her refill request.

## 2024-05-28 DIAGNOSIS — Z5112 Encounter for antineoplastic immunotherapy: Secondary | ICD-10-CM | POA: Diagnosis not present

## 2024-05-28 DIAGNOSIS — C8510 Unspecified B-cell lymphoma, unspecified site: Secondary | ICD-10-CM | POA: Diagnosis not present

## 2024-05-28 DIAGNOSIS — C8338 Diffuse large B-cell lymphoma, lymph nodes of multiple sites: Secondary | ICD-10-CM | POA: Diagnosis not present

## 2024-05-28 DIAGNOSIS — Z5111 Encounter for antineoplastic chemotherapy: Secondary | ICD-10-CM | POA: Diagnosis not present

## 2024-05-28 DIAGNOSIS — C8370 Burkitt lymphoma, unspecified site: Secondary | ICD-10-CM | POA: Diagnosis not present

## 2024-06-04 ENCOUNTER — Other Ambulatory Visit: Payer: Self-pay | Admitting: Family Medicine

## 2024-06-04 DIAGNOSIS — C8510 Unspecified B-cell lymphoma, unspecified site: Secondary | ICD-10-CM | POA: Diagnosis not present

## 2024-06-04 DIAGNOSIS — C8370 Burkitt lymphoma, unspecified site: Secondary | ICD-10-CM | POA: Diagnosis not present

## 2024-06-04 DIAGNOSIS — Z5111 Encounter for antineoplastic chemotherapy: Secondary | ICD-10-CM | POA: Diagnosis not present

## 2024-06-04 DIAGNOSIS — Z5112 Encounter for antineoplastic immunotherapy: Secondary | ICD-10-CM | POA: Diagnosis not present

## 2024-06-11 ENCOUNTER — Telehealth: Payer: Self-pay

## 2024-06-11 DIAGNOSIS — C8370 Burkitt lymphoma, unspecified site: Secondary | ICD-10-CM | POA: Diagnosis not present

## 2024-06-11 DIAGNOSIS — C8510 Unspecified B-cell lymphoma, unspecified site: Secondary | ICD-10-CM | POA: Diagnosis not present

## 2024-06-11 DIAGNOSIS — Z5112 Encounter for antineoplastic immunotherapy: Secondary | ICD-10-CM | POA: Diagnosis not present

## 2024-06-11 DIAGNOSIS — Z5111 Encounter for antineoplastic chemotherapy: Secondary | ICD-10-CM | POA: Diagnosis not present

## 2024-06-11 NOTE — Telephone Encounter (Signed)
 Patient scheduled.

## 2024-06-11 NOTE — Telephone Encounter (Signed)
 Pt ready for scheduling for Prolia  on or after : 06/30/2024  Option# 1: Buy/Bill (Office supplied medication)  Out-of-pocket cost due at time of clinic visit: $0  Primary: Medicare Prolia  co-insurance: 0% Admin fee co-insurance: 0%  Secondary: [employer]  Medical Benefit Details: Date Benefits were checked: 06/08/24 Deductible: $257 out of $257/ Coinsurance: 0%/ Admin Fee: $0  Prior Auth: Not needed PA# Expiration Date:   # of doses approved: -----------------------------------------------------------------------   ** This summary of benefits is an estimation of the patient's out-of-pocket cost. Exact cost may very based on individual plan coverage.

## 2024-06-18 DIAGNOSIS — C8510 Unspecified B-cell lymphoma, unspecified site: Secondary | ICD-10-CM | POA: Diagnosis not present

## 2024-06-18 DIAGNOSIS — Z5112 Encounter for antineoplastic immunotherapy: Secondary | ICD-10-CM | POA: Diagnosis not present

## 2024-06-18 DIAGNOSIS — Z5111 Encounter for antineoplastic chemotherapy: Secondary | ICD-10-CM | POA: Diagnosis not present

## 2024-06-18 DIAGNOSIS — C8370 Burkitt lymphoma, unspecified site: Secondary | ICD-10-CM | POA: Diagnosis not present

## 2024-07-02 ENCOUNTER — Ambulatory Visit

## 2024-07-02 DIAGNOSIS — M818 Other osteoporosis without current pathological fracture: Secondary | ICD-10-CM

## 2024-07-02 MED ORDER — DENOSUMAB 60 MG/ML ~~LOC~~ SOSY
60.0000 mg | PREFILLED_SYRINGE | Freq: Once | SUBCUTANEOUS | Status: AC
  Administered 2024-07-02: 60 mg via SUBCUTANEOUS

## 2024-07-02 NOTE — Progress Notes (Signed)
 Per orders of Dr. Darren Em, injection of Prolia  60 mg given by Malajah Oceguera L Melisse Caetano. Patient tolerated injection well.

## 2024-07-06 DIAGNOSIS — Z79899 Other long term (current) drug therapy: Secondary | ICD-10-CM | POA: Diagnosis not present

## 2024-07-06 DIAGNOSIS — C8338 Diffuse large B-cell lymphoma, lymph nodes of multiple sites: Secondary | ICD-10-CM | POA: Diagnosis not present

## 2024-07-09 DIAGNOSIS — C8510 Unspecified B-cell lymphoma, unspecified site: Secondary | ICD-10-CM | POA: Diagnosis not present

## 2024-07-11 ENCOUNTER — Other Ambulatory Visit: Payer: Self-pay

## 2024-07-11 DIAGNOSIS — Z96641 Presence of right artificial hip joint: Secondary | ICD-10-CM | POA: Diagnosis not present

## 2024-07-11 DIAGNOSIS — E785 Hyperlipidemia, unspecified: Secondary | ICD-10-CM | POA: Diagnosis not present

## 2024-07-11 DIAGNOSIS — M1990 Unspecified osteoarthritis, unspecified site: Secondary | ICD-10-CM | POA: Diagnosis not present

## 2024-07-11 DIAGNOSIS — C8338 Diffuse large B-cell lymphoma, lymph nodes of multiple sites: Secondary | ICD-10-CM | POA: Diagnosis not present

## 2024-07-11 DIAGNOSIS — I10 Essential (primary) hypertension: Secondary | ICD-10-CM | POA: Diagnosis not present

## 2024-07-11 DIAGNOSIS — M818 Other osteoporosis without current pathological fracture: Secondary | ICD-10-CM

## 2024-07-11 MED ORDER — DENOSUMAB 60 MG/ML ~~LOC~~ SOSY: 60.0000 mg | PREFILLED_SYRINGE | SUBCUTANEOUS | Status: AC

## 2024-07-11 NOTE — Progress Notes (Signed)
 Pt on Bone Density Report. Order placed for PA.

## 2024-07-16 ENCOUNTER — Other Ambulatory Visit: Payer: Self-pay | Admitting: Family Medicine

## 2024-07-17 DIAGNOSIS — E785 Hyperlipidemia, unspecified: Secondary | ICD-10-CM | POA: Diagnosis not present

## 2024-07-17 DIAGNOSIS — C8338 Diffuse large B-cell lymphoma, lymph nodes of multiple sites: Secondary | ICD-10-CM | POA: Diagnosis not present

## 2024-07-17 DIAGNOSIS — I10 Essential (primary) hypertension: Secondary | ICD-10-CM | POA: Diagnosis not present

## 2024-07-17 DIAGNOSIS — Z96641 Presence of right artificial hip joint: Secondary | ICD-10-CM | POA: Diagnosis not present

## 2024-07-17 DIAGNOSIS — M1990 Unspecified osteoarthritis, unspecified site: Secondary | ICD-10-CM | POA: Diagnosis not present

## 2024-07-20 DIAGNOSIS — Z96641 Presence of right artificial hip joint: Secondary | ICD-10-CM | POA: Diagnosis not present

## 2024-07-20 DIAGNOSIS — E785 Hyperlipidemia, unspecified: Secondary | ICD-10-CM | POA: Diagnosis not present

## 2024-07-20 DIAGNOSIS — M1990 Unspecified osteoarthritis, unspecified site: Secondary | ICD-10-CM | POA: Diagnosis not present

## 2024-07-20 DIAGNOSIS — C8338 Diffuse large B-cell lymphoma, lymph nodes of multiple sites: Secondary | ICD-10-CM | POA: Diagnosis not present

## 2024-07-20 DIAGNOSIS — I10 Essential (primary) hypertension: Secondary | ICD-10-CM | POA: Diagnosis not present

## 2024-07-23 DIAGNOSIS — I10 Essential (primary) hypertension: Secondary | ICD-10-CM | POA: Diagnosis not present

## 2024-07-23 DIAGNOSIS — E785 Hyperlipidemia, unspecified: Secondary | ICD-10-CM | POA: Diagnosis not present

## 2024-07-23 DIAGNOSIS — C8338 Diffuse large B-cell lymphoma, lymph nodes of multiple sites: Secondary | ICD-10-CM | POA: Diagnosis not present

## 2024-07-23 DIAGNOSIS — Z96641 Presence of right artificial hip joint: Secondary | ICD-10-CM | POA: Diagnosis not present

## 2024-07-23 DIAGNOSIS — M1990 Unspecified osteoarthritis, unspecified site: Secondary | ICD-10-CM | POA: Diagnosis not present

## 2024-07-26 DIAGNOSIS — Z96641 Presence of right artificial hip joint: Secondary | ICD-10-CM | POA: Diagnosis not present

## 2024-07-26 DIAGNOSIS — E785 Hyperlipidemia, unspecified: Secondary | ICD-10-CM | POA: Diagnosis not present

## 2024-07-26 DIAGNOSIS — C8338 Diffuse large B-cell lymphoma, lymph nodes of multiple sites: Secondary | ICD-10-CM | POA: Diagnosis not present

## 2024-07-26 DIAGNOSIS — I10 Essential (primary) hypertension: Secondary | ICD-10-CM | POA: Diagnosis not present

## 2024-07-26 DIAGNOSIS — M1990 Unspecified osteoarthritis, unspecified site: Secondary | ICD-10-CM | POA: Diagnosis not present

## 2024-07-30 DIAGNOSIS — M1990 Unspecified osteoarthritis, unspecified site: Secondary | ICD-10-CM | POA: Diagnosis not present

## 2024-07-30 DIAGNOSIS — E785 Hyperlipidemia, unspecified: Secondary | ICD-10-CM | POA: Diagnosis not present

## 2024-07-30 DIAGNOSIS — I10 Essential (primary) hypertension: Secondary | ICD-10-CM | POA: Diagnosis not present

## 2024-07-30 DIAGNOSIS — C8338 Diffuse large B-cell lymphoma, lymph nodes of multiple sites: Secondary | ICD-10-CM | POA: Diagnosis not present

## 2024-07-30 DIAGNOSIS — Z96641 Presence of right artificial hip joint: Secondary | ICD-10-CM | POA: Diagnosis not present

## 2024-08-03 DIAGNOSIS — M1990 Unspecified osteoarthritis, unspecified site: Secondary | ICD-10-CM | POA: Diagnosis not present

## 2024-08-03 DIAGNOSIS — C8338 Diffuse large B-cell lymphoma, lymph nodes of multiple sites: Secondary | ICD-10-CM | POA: Diagnosis not present

## 2024-08-03 DIAGNOSIS — E785 Hyperlipidemia, unspecified: Secondary | ICD-10-CM | POA: Diagnosis not present

## 2024-08-03 DIAGNOSIS — Z96641 Presence of right artificial hip joint: Secondary | ICD-10-CM | POA: Diagnosis not present

## 2024-08-03 DIAGNOSIS — I10 Essential (primary) hypertension: Secondary | ICD-10-CM | POA: Diagnosis not present

## 2024-08-09 DIAGNOSIS — Z96641 Presence of right artificial hip joint: Secondary | ICD-10-CM | POA: Diagnosis not present

## 2024-08-09 DIAGNOSIS — E785 Hyperlipidemia, unspecified: Secondary | ICD-10-CM | POA: Diagnosis not present

## 2024-08-09 DIAGNOSIS — I10 Essential (primary) hypertension: Secondary | ICD-10-CM | POA: Diagnosis not present

## 2024-08-09 DIAGNOSIS — M1990 Unspecified osteoarthritis, unspecified site: Secondary | ICD-10-CM | POA: Diagnosis not present

## 2024-08-09 DIAGNOSIS — C8338 Diffuse large B-cell lymphoma, lymph nodes of multiple sites: Secondary | ICD-10-CM | POA: Diagnosis not present

## 2024-08-10 ENCOUNTER — Other Ambulatory Visit: Payer: Self-pay | Admitting: Family Medicine

## 2024-08-10 DIAGNOSIS — C8338 Diffuse large B-cell lymphoma, lymph nodes of multiple sites: Secondary | ICD-10-CM | POA: Diagnosis not present

## 2024-08-10 DIAGNOSIS — I10 Essential (primary) hypertension: Secondary | ICD-10-CM | POA: Diagnosis not present

## 2024-08-10 DIAGNOSIS — Z96641 Presence of right artificial hip joint: Secondary | ICD-10-CM | POA: Diagnosis not present

## 2024-08-10 DIAGNOSIS — E785 Hyperlipidemia, unspecified: Secondary | ICD-10-CM | POA: Diagnosis not present

## 2024-08-10 DIAGNOSIS — M1990 Unspecified osteoarthritis, unspecified site: Secondary | ICD-10-CM | POA: Diagnosis not present

## 2024-08-13 DIAGNOSIS — E785 Hyperlipidemia, unspecified: Secondary | ICD-10-CM | POA: Diagnosis not present

## 2024-08-13 DIAGNOSIS — M1990 Unspecified osteoarthritis, unspecified site: Secondary | ICD-10-CM | POA: Diagnosis not present

## 2024-08-13 DIAGNOSIS — I10 Essential (primary) hypertension: Secondary | ICD-10-CM | POA: Diagnosis not present

## 2024-08-13 DIAGNOSIS — C8338 Diffuse large B-cell lymphoma, lymph nodes of multiple sites: Secondary | ICD-10-CM | POA: Diagnosis not present

## 2024-08-13 DIAGNOSIS — Z96641 Presence of right artificial hip joint: Secondary | ICD-10-CM | POA: Diagnosis not present

## 2024-08-14 ENCOUNTER — Other Ambulatory Visit: Payer: Self-pay | Admitting: Family Medicine

## 2024-08-16 ENCOUNTER — Other Ambulatory Visit: Payer: Self-pay | Admitting: Family Medicine

## 2024-08-16 MED ORDER — BISOPROLOL-HYDROCHLOROTHIAZIDE 5-6.25 MG PO TABS: 1.0000 | ORAL_TABLET | Freq: Every day | ORAL | 0 refills | Status: DC

## 2024-08-16 NOTE — Telephone Encounter (Signed)
 Copied from CRM [id number]. Topic: Clinical - Medication Refill >> Aug 16, 2024  9:50 AM Ashley R wrote: Medication: requesting 90 day supply or 3 refills for: bisoprolol -hydrochlorothiazide  (ZIAC ) 5-6.25 MG tablet   Has the patient contacted their pharmacy? Yes - Said to contact PCP   This is the patient's preferred pharmacy:  [id number] - [place], [place] - [address] [facility] [place] [address] Phone: [phone] Fax: [phone]   Is this the correct pharmacy for this prescription? Yes If no, delete pharmacy and type the correct one.   Has the prescription been filled recently? Yes  Is the patient out of the medication? No  Has the patient been seen for an appointment in the last year OR does the patient have an upcoming appointment? Yes  Can we respond through MyChart? No  Agent: Please be advised that Rx refills may take up to 3 business days. We ask that you follow-up with your pharmacy.

## 2024-08-16 NOTE — Telephone Encounter (Signed)
 Patient requests 90 day supply

## 2024-08-20 DIAGNOSIS — Z96641 Presence of right artificial hip joint: Secondary | ICD-10-CM | POA: Diagnosis not present

## 2024-08-20 DIAGNOSIS — C8338 Diffuse large B-cell lymphoma, lymph nodes of multiple sites: Secondary | ICD-10-CM | POA: Diagnosis not present

## 2024-08-20 DIAGNOSIS — M1990 Unspecified osteoarthritis, unspecified site: Secondary | ICD-10-CM | POA: Diagnosis not present

## 2024-08-20 DIAGNOSIS — E785 Hyperlipidemia, unspecified: Secondary | ICD-10-CM | POA: Diagnosis not present

## 2024-08-20 DIAGNOSIS — I10 Essential (primary) hypertension: Secondary | ICD-10-CM | POA: Diagnosis not present

## 2024-08-27 DIAGNOSIS — M1990 Unspecified osteoarthritis, unspecified site: Secondary | ICD-10-CM | POA: Diagnosis not present

## 2024-08-27 DIAGNOSIS — I10 Essential (primary) hypertension: Secondary | ICD-10-CM | POA: Diagnosis not present

## 2024-08-27 DIAGNOSIS — Z96641 Presence of right artificial hip joint: Secondary | ICD-10-CM | POA: Diagnosis not present

## 2024-08-27 DIAGNOSIS — C8338 Diffuse large B-cell lymphoma, lymph nodes of multiple sites: Secondary | ICD-10-CM | POA: Diagnosis not present

## 2024-08-27 DIAGNOSIS — E785 Hyperlipidemia, unspecified: Secondary | ICD-10-CM | POA: Diagnosis not present

## 2024-09-03 DIAGNOSIS — C8338 Diffuse large B-cell lymphoma, lymph nodes of multiple sites: Secondary | ICD-10-CM | POA: Diagnosis not present

## 2024-09-03 DIAGNOSIS — M1990 Unspecified osteoarthritis, unspecified site: Secondary | ICD-10-CM | POA: Diagnosis not present

## 2024-09-03 DIAGNOSIS — Z96641 Presence of right artificial hip joint: Secondary | ICD-10-CM | POA: Diagnosis not present

## 2024-09-03 DIAGNOSIS — I10 Essential (primary) hypertension: Secondary | ICD-10-CM | POA: Diagnosis not present

## 2024-09-03 DIAGNOSIS — E785 Hyperlipidemia, unspecified: Secondary | ICD-10-CM | POA: Diagnosis not present

## 2024-09-08 ENCOUNTER — Other Ambulatory Visit: Payer: Self-pay | Admitting: Family Medicine

## 2024-10-08 DIAGNOSIS — C83398 Diffuse large b-cell lymphoma of other extranodal and solid organ sites: Secondary | ICD-10-CM | POA: Diagnosis not present

## 2024-10-08 DIAGNOSIS — C8510 Unspecified B-cell lymphoma, unspecified site: Secondary | ICD-10-CM | POA: Diagnosis not present

## 2024-10-30 ENCOUNTER — Other Ambulatory Visit: Payer: Self-pay | Admitting: Family Medicine

## 2024-10-30 NOTE — Telephone Encounter (Signed)
 I spoke with the patient and she will follow up in March per request

## 2024-11-02 ENCOUNTER — Ambulatory Visit: Admitting: Family Medicine

## 2024-11-02 VITALS — BP 144/60 | HR 74 | Temp 98.0°F | Wt 108.9 lb

## 2024-11-02 DIAGNOSIS — C8379 Burkitt lymphoma, extranodal and solid organ sites: Secondary | ICD-10-CM

## 2024-11-02 DIAGNOSIS — I10 Essential (primary) hypertension: Secondary | ICD-10-CM

## 2024-11-02 DIAGNOSIS — R202 Paresthesia of skin: Secondary | ICD-10-CM | POA: Diagnosis not present

## 2024-11-02 DIAGNOSIS — D649 Anemia, unspecified: Secondary | ICD-10-CM | POA: Diagnosis not present

## 2024-11-02 DIAGNOSIS — Z23 Encounter for immunization: Secondary | ICD-10-CM | POA: Diagnosis not present

## 2024-11-02 MED ORDER — FISH OIL 1000 MG PO CAPS: 2000.0000 mg | ORAL_CAPSULE | Freq: Two times a day (BID) | ORAL | 3 refills | Status: AC

## 2024-11-02 MED ORDER — VITAMIN D (ERGOCALCIFEROL) 1.25 MG (50000 UNIT) PO CAPS: 50000.0000 [IU] | ORAL_CAPSULE | ORAL | 0 refills | Status: AC

## 2024-11-02 MED ORDER — GLUCOSAMINE-CHONDROITIN 250-200 MG PO TABS: 1.0000 | ORAL_TABLET | Freq: Every day | ORAL | 3 refills | Status: AC

## 2024-11-02 MED ORDER — VIACTIV CALCIUM PLUS D 650-12.5-40 MG-MCG PO CHEW: CHEWABLE_TABLET | ORAL | 3 refills | Status: AC

## 2024-11-02 MED ORDER — BISOPROLOL-HYDROCHLOROTHIAZIDE 5-6.25 MG PO TABS: 1.0000 | ORAL_TABLET | Freq: Every day | ORAL | 3 refills | Status: AC

## 2024-11-02 NOTE — Progress Notes (Signed)
 "  Established Patient Office Visit  Subjective   Patient ID: [name], female    DOB: 13-Nov-1931  Age: 89 y.o. MRN: [id number]  Chief Complaint  Patient presents with   Medical Management of Chronic Issues    HPI   [name] is seen for routine medical follow-up.  She has history of lymphoma which is currently followed by [facility].  Has had EGD on Monday and biopsies did not show any dysplasia, metaplasia, or evidence for recurrent lymphoma.  She states she has been scheduled for PET scan later on.  Generally doing well.  She and her 71 year old husband still live independently.  She still does all her housework and purchasing of things like groceries.  Her other medical problems include history of hypertension, history of diverticulosis of the colon, IBS, chronic normocytic anemia, hyperlipidemia.  She takes bisoprolol  HCTZ for hypertension.  Needs refills.  She has had multiple recent labs done through [facility] and these were reviewed.  She had recent blood chemistries which were normal.  Recent hemoglobin 10.2.  She does have chronic numbness involving both feet and legs.  No associated pain.  Takes low-dose gabapentin  which has not helped.  Recent B12 level normal.  Also has had thyroid  function testing within the past year which was normal.  No history of diabetes.  No alcohol  use.  No flu vaccine yet but she does consent to getting this today.  Past Medical History:  Diagnosis Date   Anemia    Arthritis    Constipation    Diverticulosis    Diverticulosis    DJD (degenerative joint disease)    Gastric ulcer    Hemorrhoids    Hypertension    Inguinal hernia, left    Irritable bowel syndrome    Osteoporosis    Stomach cancer (HCC)    Urethral caruncle    UTI (lower urinary tract infection)    hx of frequent uti's   Vitamin D  deficiency    Past Surgical History:  Procedure Laterality Date   APPENDECTOMY     BIOPSY OF SKIN SUBCUTANEOUS TISSUE AND/OR  MUCOUS MEMBRANE  01/03/2024   Procedure: BIOPSY, gastric;  Surgeon: Stacia Glendia BRAVO, MD;  Location: [facility] ENDOSCOPY;  Service: Gastroenterology;;   BREAST SURGERY Left    biopsy   CERVICAL FUSION     colon polyps     ESOPHAGOGASTRODUODENOSCOPY N/A 01/03/2024   Procedure: EGD (ESOPHAGOGASTRODUODENOSCOPY);  Surgeon: Stacia Glendia BRAVO, MD;  Location: [facility] ENDOSCOPY;  Service: Gastroenterology;  Laterality: N/A;   FINGER SURGERY     HEMORRHOID SURGERY     HIP SURGERY Right    INGUINAL HERNIA REPAIR Left 01/13/2017   Procedure: LEFT INGUINAL HERNIA REPAIR WITH MESH;  Surgeon: Krystal Russell, MD;  Location: [facility];  Service: General;  Laterality: Left;   INSERTION OF MESH Left 01/13/2017   Procedure: INSERTION OF MESH;  Surgeon: Krystal Russell, MD;  Location: [facility];  Service: General;  Laterality: Left;   LUMBAR FUSION     LUMBAR LAMINECTOMY     partial resection of colon for tumor     SHOULDER SURGERY Left    TOTAL HIP ARTHROPLASTY     right   TOTAL HIP REVISION Right 11/17/2020   Procedure: ORIF of Right periprosthetic fracture;  Surgeon: Ernie Cough, MD;  Location: [facility];  Service: Orthopedics;  Laterality: Right;    reports that she has never smoked. She has never used smokeless tobacco. She reports that she  does not drink alcohol  and does not use drugs. family history includes Breast cancer in her daughter and sister; Cancer in her father; Cerebral aneurysm in her brother; Colon cancer in her mother; Dementia in her sister; Diabetes in her brother; Lung cancer in her father; Other in her brother; Ovarian cancer in her sister. Allergies[1]  Review of Systems  Constitutional:  Negative for chills, fever, malaise/fatigue and weight loss.  Eyes:  Negative for blurred vision.  Respiratory:  Negative for cough and shortness of breath.   Cardiovascular:  Negative for chest pain.  Gastrointestinal:  Negative for abdominal pain, diarrhea, nausea and  vomiting.  Genitourinary:  Negative for dysuria.  Neurological:  Negative for dizziness, weakness and headaches.  Psychiatric/Behavioral:  Negative for depression.       Objective:     BP (!) 144/60   Pulse 74   Temp 98 F (36.7 C) (Oral)   Wt 108 lb 14.4 oz (49.4 kg)   SpO2 99%   BMI 17.85 kg/m  BP Readings from Last 3 Encounters:  11/02/24 (!) 144/60  01/27/24 (!) 142/84  01/17/24 (!) 149/87   Wt Readings from Last 3 Encounters:  11/02/24 108 lb 14.4 oz (49.4 kg)  01/27/24 118 lb (53.5 kg)  01/17/24 117 lb 12.8 oz (53.4 kg)      Physical Exam Vitals reviewed.  Constitutional:      General: She is not in acute distress.    Appearance: She is well-developed. She is not ill-appearing.  Eyes:     Pupils: Pupils are equal, round, and reactive to light.  Neck:     Thyroid : No thyromegaly.     Vascular: No JVD.  Cardiovascular:     Rate and Rhythm: Normal rate and regular rhythm.     Heart sounds:     No gallop.  Pulmonary:     Effort: Pulmonary effort is normal. No respiratory distress.     Breath sounds: Normal breath sounds. No wheezing or rales.  Musculoskeletal:     Cervical back: Neck supple.     Right lower leg: No edema.     Left lower leg: No edema.  Neurological:     Mental Status: She is alert.      No results found for any visits on 11/02/24.    The ASCVD Risk score (Arnett DK, et al., 2019) failed to calculate for the following reasons:   The 2019 ASCVD risk score is only valid for ages 22 to 67   * - Cholesterol units were assumed    Assessment & Plan:   #1 hypertension.  Stable.  Patient on low-dose bisoprolol  HCTZ.  She had recent electrolytes which were normal with exception of sodium 135.  Potassium normal.  Renal function normal.  Continue low-sodium diet.  #2 chronic paresthesias involving feet.  We discussed relevance as risk for balance problems.  Consider additional support such as cane with ambulation.  She is currently on  gabapentin  but no benefit with symptoms.  We discussed possible discontinuation of gabapentin .  She had recent B12 and thyroid  which were normal.  No history of diabetes.  #3 lymphoma followed by [facility].  Currently in remission.  Recent EGD this week with negative biopsies.  Pending follow-up PET scan.  #4 health maintenance.  Patient has not had flu vaccine.  We discussed the increased risk with her age and comorbidities.  She agrees to influenza vaccination and in future recommend getting earlier in season around October or so.  #5  normocytic anemia stable by recent labs through [facility].  Wolm Scarlet, MD     [1]  Allergies Allergen Reactions   Codeine Other (See Comments)    Headaches   Flagyl  [Metronidazole  Hcl] Nausea Only   "

## 2024-11-12 ENCOUNTER — Ambulatory Visit

## 2024-11-12 VITALS — Ht 60.5 in | Wt 108.0 lb

## 2024-11-12 DIAGNOSIS — Z0000 Encounter for general adult medical examination without abnormal findings: Secondary | ICD-10-CM | POA: Diagnosis not present

## 2024-11-12 NOTE — Patient Instructions (Addendum)
 [name],  Thank you for taking the time for your Medicare Wellness Visit. I appreciate your continued commitment to your health goals. Please review the care plan we discussed, and feel free to reach out if I can assist you further.  Please note that Annual Wellness Visits do not include a physical exam. Some assessments may be limited, especially if the visit was conducted virtually. If needed, we may recommend an in-person follow-up with your provider.  Ongoing Care Seeing your primary care provider every 3 to 6 months helps us  monitor your health and provide consistent, personalized care.   Referrals If a referral was made during today's visit and you haven't received any updates within two weeks, please contact the referred provider directly to check on the status.  Recommended Screenings:  Health Maintenance  Topic Date Due   Zoster (Shingles) Vaccine (1 of 2) 02/20/1953   DTaP/Tdap/Td vaccine (2 - Tdap) 04/02/2018   COVID-19 Vaccine (4 - 2025-26 season) 06/25/2024   Medicare Annual Wellness Visit  11/12/2025   Pneumococcal Vaccine for age over 67  Completed   Flu Shot  Completed   Osteoporosis screening with Bone Density Scan  Completed   Meningitis B Vaccine  Aged Out   Breast Cancer Screening  Discontinued       11/12/2024    1:15 PM  Advanced Directives  Does Patient Have a Medical Advance Directive? Yes  Type of Estate Agent of [place];Living will  Does patient want to make changes to medical advance directive? No - Patient declined  Copy of Healthcare Power of Attorney in Chart? No - copy requested    Vision: Annual vision screenings are recommended for early detection of glaucoma, cataracts, and diabetic retinopathy. These exams can also reveal signs of chronic conditions such as diabetes and high blood pressure.  Dental: Annual dental screenings help detect early signs of oral cancer, gum disease, and other conditions linked to overall  health, including heart disease and diabetes.  Please see the attached documents for additional preventive care recommendations.

## 2024-11-12 NOTE — Progress Notes (Signed)
 "  Chief Complaint  Patient presents with   Medicare Wellness     Subjective:   [name] is a 89 y.o. female who presents for a Medicare Annual Wellness Visit.  Visit info / Clinical Intake: Medicare Wellness Visit Type:: Subsequent Annual Wellness Visit Persons participating in visit and providing information:: patient Medicare Wellness Visit Mode:: Telephone If telephone:: video declined Since this visit was completed virtually, some vitals may be partially provided or unavailable. Missing vitals are due to the limitations of the virtual format.: Documented vitals are patient reported If Telephone or Video please confirm:: I connected with patient using audio/video enable telemedicine. I verified patient identity with two identifiers, discussed telehealth limitations, and patient agreed to proceed. Patient Location:: Home Provider Location:: Office Interpreter Needed?: No Pre-visit prep was completed: yes AWV questionnaire completed by patient prior to visit?: no Living arrangements:: lives with spouse/significant other Patient's Overall Health Status Rating: (!) fair Typical amount of pain: none Does pain affect daily life?: no Are you currently prescribed opioids?: no  Dietary Habits and Nutritional Risks How many meals a day?: 3 Eats fruit and vegetables daily?: yes Most meals are obtained by: preparing own meals In the last 2 weeks, have you had any of the following?: none Diabetic:: no  Functional Status Activities of Daily Living (to include ambulation/medication): Independent Ambulation: Independent with device- listed below Home Assistive Devices/Equipment: Eyeglasses Medication Administration: Independent Home Management (perform basic housework or laundry): Independent Manage your own finances?: yes Primary transportation is: driving Concerns about vision?: no *vision screening is required for WTM* Concerns about hearing?: no  Fall  Screening Falls in the past year?: 0 Number of falls in past year: 0 Was there an injury with Fall?: 0 Fall Risk Category Calculator: 0 Patient Fall Risk Level: Low Fall Risk  Fall Risk Patient at Risk for Falls Due to: No Fall Risks Fall risk Follow up: Falls evaluation completed  Home and Transportation Safety: All rugs have non-skid backing?: N/A, no rugs All stairs or steps have railings?: (!) no Grab bars in the bathtub or shower?: yes Have non-skid surface in bathtub or shower?: yes Good home lighting?: yes Regular seat belt use?: yes Hospital stays in the last year:: (!) yes How many hospital stays:: 1 Reason: Evaluation  Cognitive Assessment Difficulty concentrating, remembering, or making decisions? : no Will 6CIT or Mini Cog be Completed: yes What year is it?: 0 points What month is it?: 0 points Give patient an address phrase to remember (5 components): [address] [place]  About what time is it?: 0 points Count backwards from 20 to 1: 0 points Say the months of the year in reverse: 0 points Repeat the address phrase from earlier: 0 points 6 CIT Score: 0 points  Advance Directives (For Healthcare) Does Patient Have a Medical Advance Directive?: Yes Does patient want to make changes to medical advance directive?: No - Patient declined Type of Advance Directive: Healthcare Power of [place]; Living will Copy of Healthcare Power of Attorney in Chart?: No - copy requested Copy of Living Will in Chart?: No - copy requested Would patient like information on creating a medical advance directive?: No - Patient declined  Reviewed/Updated  Reviewed/Updated: Reviewed All (Medical, Surgical, Family, Medications, Allergies, Care Teams, Patient Goals)    Allergies (verified) Codeine and Flagyl  [metronidazole  hcl]   Current Medications (verified) Outpatient Encounter Medications as of 11/12/2024  Medication Sig   acetaminophen  (TYLENOL ) 325 MG tablet Take 2  tablets (650 mg total)  by mouth every 6 (six) hours as needed for mild pain (pain score 1-3) (or Fever >/= 101).   Artificial Tear Solution (GENTEAL TEARS) 0.1-0.2-0.3 % SOLN Place 1 drop into both eyes at bedtime as needed (dry eye).   bisoprolol -hydrochlorothiazide  (ZIAC ) 5-6.25 MG tablet Take 1 tablet by mouth daily.   Calcium -Vitamin D -Vitamin K (VIACTIV CALCIUM  PLUS D) 650-12.5-40 MG-MCG-MCG CHEW As directed   feeding supplement (BOOST HIGH PROTEIN) LIQD Take 1 Container by mouth daily.   gabapentin  (NEURONTIN ) 100 MG capsule TAKE 1 CAPSULE BY MOUTH AT BEDTIME.   Glucosamine-Chondroitin 250-200 MG TABS Take 1 tablet by mouth daily.   Multiple Vitamin (MULTIVITAMIN WITH MINERALS) TABS tablet Take 1 tablet by mouth daily.   Omega-3 Fatty Acids (FISH OIL ) 1000 MG CAPS Take 2 capsules (2,000 mg total) by mouth 2 (two) times daily.   ondansetron  (ZOFRAN ) 4 MG tablet Take 1 tablet (4 mg total) by mouth every 4 (four) hours as needed for nausea or vomiting.   Vitamin D , Ergocalciferol , (DRISDOL ) 1.25 MG (50000 UNIT) CAPS capsule Take 1 capsule (50,000 Units total) by mouth every 7 (seven) days.   Facility-Administered Encounter Medications as of 11/12/2024  Medication   [START ON 12/31/2024] denosumab  (PROLIA ) injection 60 mg    History: Past Medical History:  Diagnosis Date   Anemia    Arthritis    Constipation    Diverticulosis    Diverticulosis    DJD (degenerative joint disease)    Gastric ulcer    Hemorrhoids    Hypertension    Inguinal hernia, left    Irritable bowel syndrome    Osteoporosis    Stomach cancer (HCC)    Urethral caruncle    UTI (lower urinary tract infection)    hx of frequent uti's   Vitamin D  deficiency    Past Surgical History:  Procedure Laterality Date   APPENDECTOMY     BIOPSY OF SKIN SUBCUTANEOUS TISSUE AND/OR MUCOUS MEMBRANE  01/03/2024   Procedure: BIOPSY, gastric;  Surgeon: Stacia Glendia BRAVO, MD;  Location: [facility] ENDOSCOPY;  Service:  Gastroenterology;;   BREAST SURGERY Left    biopsy   CERVICAL FUSION     colon polyps     ESOPHAGOGASTRODUODENOSCOPY N/A 01/03/2024   Procedure: EGD (ESOPHAGOGASTRODUODENOSCOPY);  Surgeon: Stacia Glendia BRAVO, MD;  Location: [facility] ENDOSCOPY;  Service: Gastroenterology;  Laterality: N/A;   FINGER SURGERY     HEMORRHOID SURGERY     HIP SURGERY Right    INGUINAL HERNIA REPAIR Left 01/13/2017   Procedure: LEFT INGUINAL HERNIA REPAIR WITH MESH;  Surgeon: Krystal Russell, MD;  Location: [facility];  Service: General;  Laterality: Left;   INSERTION OF MESH Left 01/13/2017   Procedure: INSERTION OF MESH;  Surgeon: Krystal Russell, MD;  Location: [facility];  Service: General;  Laterality: Left;   LUMBAR FUSION     LUMBAR LAMINECTOMY     partial resection of colon for tumor     SHOULDER SURGERY Left    TOTAL HIP ARTHROPLASTY     right   TOTAL HIP REVISION Right 11/17/2020   Procedure: ORIF of Right periprosthetic fracture;  Surgeon: Ernie Cough, MD;  Location: [facility];  Service: Orthopedics;  Laterality: Right;   Family History  Problem Relation Age of Onset   Colon cancer Mother    Cancer Father    Lung cancer Father    Dementia Sister    Breast cancer Sister    Ovarian cancer Sister    Cerebral aneurysm Brother  Other Brother        UNKNOWN PRIMARY - CANCER   Diabetes Brother    Breast cancer Daughter    Social History   Occupational History   Occupation: retired    Associate Professor: RETIRED  Tobacco Use   Smoking status: Never   Smokeless tobacco: Never  Vaping Use   Vaping status: Never Used  Substance and Sexual Activity   Alcohol  use: No   Drug use: No   Sexual activity: Yes   Tobacco Counseling Counseling given: No  SDOH Screenings   Food Insecurity: No Food Insecurity (11/12/2024)  Housing: Low Risk (11/12/2024)  Transportation Needs: No Transportation Needs (11/12/2024)  Utilities: Not At Risk (11/12/2024)  Alcohol  Screen: Low Risk  (11/07/2023)  Depression (PHQ2-9): Low Risk (11/12/2024)  Financial Resource Strain: Low Risk (11/07/2023)  Physical Activity: Sufficiently Active (11/12/2024)  Social Connections: Socially Integrated (11/12/2024)  Stress: No Stress Concern Present (11/12/2024)  Tobacco Use: Low Risk (11/12/2024)  Health Literacy: Adequate Health Literacy (11/12/2024)   See flowsheets for full screening details  Depression Screen PHQ 2 & 9 Depression Scale- Over the past 2 weeks, how often have you been bothered by any of the following problems? Little interest or pleasure in doing things: 0 Feeling down, depressed, or hopeless (PHQ Adolescent also includes...irritable): 0 PHQ-2 Total Score: 0     Goals Addressed               This Visit's Progress     Remain active (pt-stated)               Objective:    Today's Vitals   11/12/24 1313  Weight: 108 lb (49 kg)  Height: 5' 0.5 (1.537 m)   Body mass index is 20.75 kg/m.  Hearing/Vision screen Hearing Screening - Comments:: Denies hearing difficulties   Vision Screening - Comments:: Wears rx glasses - up to date with routine eye exams with  [facility] Immunizations and Health Maintenance Health Maintenance  Topic Date Due   Zoster Vaccines- Shingrix (1 of 2) 02/20/1953   DTaP/Tdap/Td (2 - Tdap) 04/02/2018   COVID-19 Vaccine (4 - 2025-26 season) 06/25/2024   Medicare Annual Wellness (AWV)  11/12/2025   Pneumococcal Vaccine: 50+ Years  Completed   Influenza Vaccine  Completed   Bone Density Scan  Completed   Meningococcal B Vaccine  Aged Out   Mammogram  Discontinued        Assessment/Plan:  This is a routine wellness examination for [place].  Patient Care Team: Micheal Wolm ORN, MD as PCP - General (Family Medicine) Liane Sharyne MATSU, [facility] (Inactive) as Pharmacist (Pharmacist) Ezzard Valaria LABOR, MD as Consulting Physician (Oncology)  I have personally reviewed and noted the following in the patients chart:    Medical and social history Use of alcohol , tobacco or illicit drugs  Current medications and supplements including opioid prescriptions. Functional ability and status Nutritional status Physical activity Advanced directives List of other physicians Hospitalizations, surgeries, and ER visits in previous 12 months Vitals Screenings to include cognitive, depression, and falls Referrals and appointments  No orders of the defined types were placed in this encounter.  In addition, I have reviewed and discussed with patient certain preventive protocols, quality metrics, and best practice recommendations. A written personalized care plan for preventive services as well as general preventive health recommendations were provided to patient.   Rojelio ORN Blush, LPN   8/80/7973   Return in 53 weeks (on 11/18/2025).  After Visit Summary: (MyChart) Due to  this being a telephonic visit, the after visit summary with patients personalized plan was offered to patient via MyChart   Nurse Notes: No voiced or noted concerns at this time "

## 2025-11-18 ENCOUNTER — Ambulatory Visit
